# Patient Record
Sex: Female | Born: 1970 | Race: Black or African American | Hispanic: No | Marital: Single | Smoking: Former smoker
Health system: Southern US, Community
[De-identification: ages and names within clinical notes are randomized; demographics above are authoritative.]

## PROBLEM LIST (undated history)

## (undated) DIAGNOSIS — I10 Essential (primary) hypertension: Secondary | ICD-10-CM

## (undated) DIAGNOSIS — I8290 Acute embolism and thrombosis of unspecified vein: Secondary | ICD-10-CM

## (undated) DIAGNOSIS — M5430 Sciatica, unspecified side: Secondary | ICD-10-CM

## (undated) DIAGNOSIS — O2230 Deep phlebothrombosis in pregnancy, unspecified trimester: Secondary | ICD-10-CM

## (undated) DIAGNOSIS — C801 Malignant (primary) neoplasm, unspecified: Secondary | ICD-10-CM

## (undated) DIAGNOSIS — C539 Malignant neoplasm of cervix uteri, unspecified: Secondary | ICD-10-CM

## (undated) HISTORY — DX: Acute embolism and thrombosis of unspecified vein: I82.90

## (undated) HISTORY — PX: TUBAL LIGATION: SHX77

## (undated) HISTORY — DX: Malignant (primary) neoplasm, unspecified: C80.1

---

## 1998-03-27 ENCOUNTER — Emergency Department (HOSPITAL_COMMUNITY): Admission: EM | Admit: 1998-03-27 | Discharge: 1998-03-27 | Payer: Self-pay | Admitting: Emergency Medicine

## 1998-09-07 ENCOUNTER — Emergency Department (HOSPITAL_COMMUNITY): Admission: EM | Admit: 1998-09-07 | Discharge: 1998-09-07 | Payer: Self-pay | Admitting: Internal Medicine

## 1999-05-26 ENCOUNTER — Encounter: Payer: Self-pay | Admitting: *Deleted

## 1999-05-26 ENCOUNTER — Ambulatory Visit (HOSPITAL_COMMUNITY): Admission: RE | Admit: 1999-05-26 | Discharge: 1999-05-26 | Payer: Self-pay | Admitting: *Deleted

## 1999-06-08 ENCOUNTER — Inpatient Hospital Stay (HOSPITAL_COMMUNITY): Admission: AD | Admit: 1999-06-08 | Discharge: 1999-06-08 | Payer: Self-pay | Admitting: *Deleted

## 1999-10-09 ENCOUNTER — Encounter (INDEPENDENT_AMBULATORY_CARE_PROVIDER_SITE_OTHER): Payer: Self-pay

## 1999-10-09 ENCOUNTER — Inpatient Hospital Stay (HOSPITAL_COMMUNITY): Admission: AD | Admit: 1999-10-09 | Discharge: 1999-10-11 | Payer: Self-pay | Admitting: *Deleted

## 1999-11-02 ENCOUNTER — Emergency Department (HOSPITAL_COMMUNITY): Admission: EM | Admit: 1999-11-02 | Discharge: 1999-11-02 | Payer: Self-pay | Admitting: Emergency Medicine

## 1999-11-26 ENCOUNTER — Emergency Department (HOSPITAL_COMMUNITY): Admission: EM | Admit: 1999-11-26 | Discharge: 1999-11-26 | Payer: Self-pay

## 2000-03-06 ENCOUNTER — Emergency Department (HOSPITAL_COMMUNITY): Admission: EM | Admit: 2000-03-06 | Discharge: 2000-03-06 | Payer: Self-pay | Admitting: Emergency Medicine

## 2000-05-03 ENCOUNTER — Emergency Department (HOSPITAL_COMMUNITY): Admission: EM | Admit: 2000-05-03 | Discharge: 2000-05-03 | Payer: Self-pay | Admitting: Emergency Medicine

## 2000-05-04 ENCOUNTER — Emergency Department (HOSPITAL_COMMUNITY): Admission: EM | Admit: 2000-05-04 | Discharge: 2000-05-04 | Payer: Self-pay | Admitting: Emergency Medicine

## 2000-05-05 ENCOUNTER — Emergency Department (HOSPITAL_COMMUNITY): Admission: EM | Admit: 2000-05-05 | Discharge: 2000-05-06 | Payer: Self-pay

## 2001-05-18 ENCOUNTER — Emergency Department (HOSPITAL_COMMUNITY): Admission: EM | Admit: 2001-05-18 | Discharge: 2001-05-18 | Payer: Self-pay | Admitting: Emergency Medicine

## 2001-06-19 ENCOUNTER — Emergency Department (HOSPITAL_COMMUNITY): Admission: EM | Admit: 2001-06-19 | Discharge: 2001-06-19 | Payer: Self-pay | Admitting: Emergency Medicine

## 2001-10-09 ENCOUNTER — Emergency Department (HOSPITAL_COMMUNITY): Admission: EM | Admit: 2001-10-09 | Discharge: 2001-10-09 | Payer: Self-pay | Admitting: Emergency Medicine

## 2001-10-09 ENCOUNTER — Encounter: Payer: Self-pay | Admitting: Emergency Medicine

## 2003-09-06 ENCOUNTER — Emergency Department (HOSPITAL_COMMUNITY): Admission: EM | Admit: 2003-09-06 | Discharge: 2003-09-06 | Payer: Self-pay | Admitting: Emergency Medicine

## 2004-03-15 ENCOUNTER — Emergency Department (HOSPITAL_COMMUNITY): Admission: EM | Admit: 2004-03-15 | Discharge: 2004-03-15 | Payer: Self-pay | Admitting: Emergency Medicine

## 2004-08-25 ENCOUNTER — Emergency Department (HOSPITAL_COMMUNITY): Admission: EM | Admit: 2004-08-25 | Discharge: 2004-08-25 | Payer: Self-pay | Admitting: Emergency Medicine

## 2004-11-09 ENCOUNTER — Emergency Department (HOSPITAL_COMMUNITY): Admission: EM | Admit: 2004-11-09 | Discharge: 2004-11-09 | Payer: Self-pay | Admitting: Emergency Medicine

## 2004-11-15 ENCOUNTER — Emergency Department (HOSPITAL_COMMUNITY): Admission: EM | Admit: 2004-11-15 | Discharge: 2004-11-16 | Payer: Self-pay | Admitting: Emergency Medicine

## 2004-11-22 ENCOUNTER — Ambulatory Visit (HOSPITAL_COMMUNITY): Admission: RE | Admit: 2004-11-22 | Discharge: 2004-11-22 | Payer: Self-pay | Admitting: Orthopedic Surgery

## 2005-04-12 ENCOUNTER — Emergency Department (HOSPITAL_COMMUNITY): Admission: EM | Admit: 2005-04-12 | Discharge: 2005-04-12 | Payer: Self-pay | Admitting: Emergency Medicine

## 2006-01-05 ENCOUNTER — Emergency Department (HOSPITAL_COMMUNITY): Admission: EM | Admit: 2006-01-05 | Discharge: 2006-01-05 | Payer: Self-pay | Admitting: Family Medicine

## 2006-02-19 ENCOUNTER — Emergency Department (HOSPITAL_COMMUNITY): Admission: EM | Admit: 2006-02-19 | Discharge: 2006-02-19 | Payer: Self-pay | Admitting: Family Medicine

## 2006-05-06 ENCOUNTER — Emergency Department (HOSPITAL_COMMUNITY): Admission: EM | Admit: 2006-05-06 | Discharge: 2006-05-06 | Payer: Self-pay | Admitting: Family Medicine

## 2007-12-22 ENCOUNTER — Emergency Department (HOSPITAL_COMMUNITY): Admission: EM | Admit: 2007-12-22 | Discharge: 2007-12-22 | Payer: Self-pay | Admitting: Emergency Medicine

## 2008-01-11 ENCOUNTER — Emergency Department (HOSPITAL_COMMUNITY): Admission: EM | Admit: 2008-01-11 | Discharge: 2008-01-11 | Payer: Self-pay | Admitting: Family Medicine

## 2008-03-02 ENCOUNTER — Emergency Department (HOSPITAL_COMMUNITY): Admission: EM | Admit: 2008-03-02 | Discharge: 2008-03-02 | Payer: Self-pay | Admitting: Emergency Medicine

## 2008-08-22 ENCOUNTER — Inpatient Hospital Stay (HOSPITAL_COMMUNITY): Admission: AD | Admit: 2008-08-22 | Discharge: 2008-08-22 | Payer: Self-pay | Admitting: Family Medicine

## 2009-04-09 ENCOUNTER — Emergency Department (HOSPITAL_COMMUNITY): Admission: EM | Admit: 2009-04-09 | Discharge: 2009-04-09 | Payer: Self-pay | Admitting: Family Medicine

## 2009-08-17 ENCOUNTER — Emergency Department (HOSPITAL_COMMUNITY): Admission: EM | Admit: 2009-08-17 | Discharge: 2009-08-17 | Payer: Self-pay | Admitting: Emergency Medicine

## 2010-03-18 ENCOUNTER — Ambulatory Visit: Payer: Self-pay | Admitting: Obstetrics and Gynecology

## 2010-03-18 ENCOUNTER — Inpatient Hospital Stay (HOSPITAL_COMMUNITY): Admission: AD | Admit: 2010-03-18 | Discharge: 2010-03-18 | Payer: Self-pay | Admitting: Obstetrics & Gynecology

## 2010-03-26 ENCOUNTER — Encounter: Admission: RE | Admit: 2010-03-26 | Discharge: 2010-03-26 | Payer: Self-pay | Admitting: Obstetrics & Gynecology

## 2010-08-07 ENCOUNTER — Emergency Department (HOSPITAL_COMMUNITY)
Admission: EM | Admit: 2010-08-07 | Discharge: 2010-08-08 | Payer: Self-pay | Source: Home / Self Care | Admitting: Emergency Medicine

## 2010-10-07 ENCOUNTER — Emergency Department (HOSPITAL_COMMUNITY)
Admission: EM | Admit: 2010-10-07 | Discharge: 2010-10-07 | Payer: Self-pay | Source: Home / Self Care | Admitting: Family Medicine

## 2010-10-07 LAB — POCT RAPID STREP A (OFFICE): Streptococcus, Group A Screen (Direct): NEGATIVE

## 2010-12-21 LAB — POCT PREGNANCY, URINE: Preg Test, Ur: NEGATIVE

## 2011-01-06 LAB — POCT RAPID STREP A (OFFICE): Streptococcus, Group A Screen (Direct): POSITIVE — AB

## 2011-01-23 ENCOUNTER — Inpatient Hospital Stay (HOSPITAL_COMMUNITY)
Admission: RE | Admit: 2011-01-23 | Discharge: 2011-01-23 | Disposition: A | Discharge status: Home / Self Care | Payer: Self-pay | Source: Ambulatory Visit | Attending: Family Medicine | Admitting: Family Medicine

## 2011-06-30 LAB — ETHANOL: Alcohol, Ethyl (B): <5

## 2011-06-30 LAB — COMPREHENSIVE METABOLIC PANEL
ALT: 12
AST: 12
Albumin: 3.5
Alkaline Phosphatase: 66
BUN: 6
CO2: 25
Calcium: 8.8
Chloride: 107
Creatinine, Ser: 0.77
GFR calc Af Amer: >60
GFR calc non Af Amer: >60
Glucose, Bld: 97
Potassium: 4.2
Sodium: 138
Total Bilirubin: 1
Total Protein: 6.8

## 2011-06-30 LAB — DIFFERENTIAL
Basophils Absolute: 0
Basophils Relative: 0
Eosinophils Absolute: 0.1
Eosinophils Relative: 1
Lymphocytes Relative: 34
Lymphs Abs: 2.7
Monocytes Absolute: 0.6
Monocytes Relative: 8
Neutro Abs: 4.4
Neutrophils Relative %: 56

## 2011-06-30 LAB — CBC
HCT: 35.6 — ABNORMAL LOW
Hemoglobin: 11.6 — ABNORMAL LOW
MCHC: 32.5
MCV: 79.4
Platelets: 358
RBC: 4.49
RDW: 15.9 — ABNORMAL HIGH
WBC: 7.8

## 2011-06-30 LAB — RAPID URINE DRUG SCREEN, HOSP PERFORMED
Amphetamines: NOT DETECTED
Barbiturates: NOT DETECTED
Benzodiazepines: NOT DETECTED
Cocaine: NOT DETECTED
Opiates: POSITIVE — AB
Tetrahydrocannabinol: POSITIVE — AB

## 2011-06-30 LAB — ACETAMINOPHEN LEVEL: Acetaminophen (Tylenol), Serum: 11.7

## 2011-06-30 LAB — SALICYLATE LEVEL: Salicylate Lvl: <4

## 2011-07-07 LAB — POCT PREGNANCY, URINE: Preg Test, Ur: NEGATIVE

## 2011-07-07 LAB — WET PREP, GENITAL
Clue Cells Wet Prep HPF POC: NONE SEEN
Yeast Wet Prep HPF POC: NONE SEEN

## 2011-07-07 LAB — GC/CHLAMYDIA PROBE AMP, GENITAL
Chlamydia, DNA Probe: NEGATIVE
GC Probe Amp, Genital: NEGATIVE

## 2011-10-11 ENCOUNTER — Encounter: Payer: Self-pay | Admitting: Emergency Medicine

## 2011-10-11 ENCOUNTER — Emergency Department (HOSPITAL_COMMUNITY)
Admission: EM | Admit: 2011-10-11 | Discharge: 2011-10-11 | Disposition: A | Discharge status: Home / Self Care | Payer: Self-pay | Attending: Emergency Medicine | Admitting: Emergency Medicine

## 2011-10-11 DIAGNOSIS — R062 Wheezing: Secondary | ICD-10-CM | POA: Insufficient documentation

## 2011-10-11 DIAGNOSIS — R05 Cough: Secondary | ICD-10-CM | POA: Insufficient documentation

## 2011-10-11 DIAGNOSIS — J069 Acute upper respiratory infection, unspecified: Secondary | ICD-10-CM | POA: Insufficient documentation

## 2011-10-11 DIAGNOSIS — R070 Pain in throat: Secondary | ICD-10-CM | POA: Insufficient documentation

## 2011-10-11 DIAGNOSIS — H9209 Otalgia, unspecified ear: Secondary | ICD-10-CM | POA: Insufficient documentation

## 2011-10-11 DIAGNOSIS — R059 Cough, unspecified: Secondary | ICD-10-CM | POA: Insufficient documentation

## 2011-10-11 DIAGNOSIS — J029 Acute pharyngitis, unspecified: Secondary | ICD-10-CM

## 2011-10-11 DIAGNOSIS — F17200 Nicotine dependence, unspecified, uncomplicated: Secondary | ICD-10-CM | POA: Insufficient documentation

## 2011-10-11 LAB — RAPID STREP SCREEN (MED CTR MEBANE ONLY): Streptococcus, Group A Screen (Direct): NEGATIVE

## 2011-10-11 MED ORDER — SULFAMETHOXAZOLE-TRIMETHOPRIM 400-80 MG PO TABS: 1.0000 | ORAL_TABLET | Freq: Two times a day (BID) | ORAL | Status: AC

## 2011-10-11 MED ORDER — BENZONATATE 100 MG PO CAPS: 100.0000 mg | ORAL_CAPSULE | Freq: Three times a day (TID) | ORAL | Status: AC

## 2011-10-11 NOTE — ED Provider Notes (Signed)
History     CSN: [id number]  Arrival date & time 10/11/11  1045   First MD Initiated Contact with Patient 10/11/11 1133      Chief Complaint  Patient presents with  . Sore Throat    (Consider location/radiation/quality/duration/timing/severity/associated sxs/prior treatment) HPI  Sore Throat: Patient complains of sore throat. Associated symptoms include bilateral ear pain, productive cough and sore throat.Onset of symptoms was 5 days ago, stable since that time. She is very dehydrated. She has had recent close exposure to someone with proven streptococcal pharyngitis.   History reviewed. No pertinent past medical history.  History reviewed. No pertinent past surgical history.  History reviewed. No pertinent family history.  History  Substance Use Topics  . Smoking status: Current Everyday Smoker  . Smokeless tobacco: Not on file  . Alcohol Use: No    OB History    Grav Para Term Preterm Abortions TAB SAB Ect Mult Living                  Review of Systems  All other systems reviewed and are negative.    Allergies  Review of patient's allergies indicates no known allergies.  Home Medications   Current Outpatient Rx  Name Route Sig Dispense Refill  . BENZONATATE 100 MG PO CAPS Oral Take 1 capsule (100 mg total) by mouth every 8 (eight) hours. 21 capsule 0  . SULFAMETHOXAZOLE-TRIMETHOPRIM 400-80 MG PO TABS Oral Take 1 tablet by mouth 2 (two) times daily. 10 tablet 0    BP 135/91  Pulse 72  Temp(Src) 98.4 F (36.9 C) (Oral)  Resp 18  SpO2 99%  Physical Exam  Constitutional: She appears well-developed and well-nourished. No distress.  HENT:  Head: Normocephalic and atraumatic.  Right Ear: Tympanic membrane normal.  Left Ear: Tympanic membrane normal.  Nose: Nose normal.  Mouth/Throat: Uvula is midline and mucous membranes are normal. Posterior oropharyngeal erythema present. No oropharyngeal exudate, posterior oropharyngeal edema or tonsillar abscesses.    Eyes: Conjunctivae are normal. Pupils are equal, round, and reactive to light.  Neck: Trachea normal, normal range of motion and full passive range of motion without pain. Neck supple.  Cardiovascular: Normal rate, regular rhythm and normal pulses.   Pulmonary/Chest: Effort normal. No respiratory distress. She has wheezes. She has no rales. Chest wall is not dull to percussion. She exhibits no tenderness, no crepitus, no edema, no deformity and no retraction.       Pt coughing during exam  Abdominal: Soft. Normal appearance and bowel sounds are normal.  Musculoskeletal: Normal range of motion.  Lymphadenopathy:       Head (right side): No submental, no submandibular, no tonsillar, no preauricular, no posterior auricular and no occipital adenopathy present.       Head (left side): No submental, no submandibular, no tonsillar, no preauricular, no posterior auricular and no occipital adenopathy present.    She has no cervical adenopathy.    She has no axillary adenopathy.  Neurological: She is alert. She has normal strength.  Skin: Skin is warm, dry and intact. She is not diaphoretic.  Psychiatric: She has a normal mood and affect. Her speech is normal and behavior is normal. Judgment and thought content normal. Cognition and memory are normal.    ED Course  Procedures (including critical care time)   Labs Reviewed  RAPID STREP SCREEN   No results found.   1. Sore throat   2. URI (upper respiratory infection)       MDM  Pt understand symptoms that warrant a return visit.       Dorthula Matas, PA 10/11/11 1243

## 2011-10-11 NOTE — ED Notes (Signed)
Pt d/c home. NAD noted at d/c

## 2011-10-11 NOTE — ED Notes (Signed)
Pt here with family member being seen; pt c/o sore throat with intermittent fever x several days

## 2011-10-11 NOTE — ED Provider Notes (Signed)
Medical screening examination/treatment/procedure(s) were performed by non-physician practitioner and as supervising physician I was immediately available for consultation/collaboration.  Gerhard Munch, MD 10/11/11 [phone]

## 2012-01-14 ENCOUNTER — Encounter (HOSPITAL_COMMUNITY): Payer: Self-pay

## 2012-01-14 ENCOUNTER — Emergency Department (INDEPENDENT_AMBULATORY_CARE_PROVIDER_SITE_OTHER)
Admission: EM | Admit: 2012-01-14 | Discharge: 2012-01-14 | Disposition: A | Discharge status: Home / Self Care | Payer: Self-pay | Source: Home / Self Care | Attending: Family Medicine | Admitting: Family Medicine

## 2012-01-14 DIAGNOSIS — L0201 Cutaneous abscess of face: Secondary | ICD-10-CM

## 2012-01-14 DIAGNOSIS — L03211 Cellulitis of face: Secondary | ICD-10-CM

## 2012-01-14 MED ORDER — SULFAMETHOXAZOLE-TRIMETHOPRIM 800-160 MG PO TABS: 1.0000 | ORAL_TABLET | Freq: Two times a day (BID) | ORAL | Status: AC

## 2012-01-14 NOTE — ED Notes (Signed)
C/o onset facial pain and swelling 3 days ago w periods of intense itching; NAD  at present

## 2012-01-14 NOTE — Discharge Instructions (Signed)
Take antibiotics as directed. You may apply an over-the-counter hydrocortisone cream for symptom relief; you may also try an over-the-counter antihistamine such as Claritin for symptoms. Please return on Monday morning for reevaluation with me, Dr. Juanetta Gosling. Return to care sooner should symptoms worsen such as any involvement near your eye or visual changes.

## 2012-01-14 NOTE — ED Provider Notes (Signed)
History     CSN: [id number]  Arrival date & time 01/14/12  1054   First MD Initiated Contact with Patient 01/14/12 1144      Chief Complaint  Patient presents with  . Facial Pain    (Consider location/radiation/quality/duration/timing/severity/associated sxs/prior treatment) HPI Comments: Emma Medina presents for evaluation of LEFT sided facial swelling over the last three days. She denies any pimple or sore. She does have a nose piercing, but does not currently have a ring and it right now. She reports that she has not worn one "in a while". However, under further interview, she reports that she did put one back in last week. She denies any nasal drainage, rhinorrhea, visual disturbance, or fever. She does report itching and pain intermittently. She denies any other significant past medical history.  The history is provided by the patient.    History reviewed. No pertinent past medical history.  History reviewed. No pertinent past surgical history.  History reviewed. No pertinent family history.  History  Substance Use Topics  . Smoking status: Current Everyday Smoker  . Smokeless tobacco: Not on file  . Alcohol Use: No    OB History    Grav Para Term Preterm Abortions TAB SAB Ect Mult Living                  Review of Systems  Constitutional: Negative.   HENT: Negative.   Eyes: Negative.   Respiratory: Negative.   Cardiovascular: Negative.   Gastrointestinal: Negative.   Genitourinary: Negative.   Musculoskeletal: Negative.   Skin: Positive for wound.  Neurological: Negative.     Allergies  Review of patient's allergies indicates no known allergies.  Home Medications   Current Outpatient Rx  Name Route Sig Dispense Refill  . SULFAMETHOXAZOLE-TRIMETHOPRIM 800-160 MG PO TABS Oral Take 1 tablet by mouth 2 (two) times daily. 14 tablet 0    BP 114/86  Pulse 71  Temp(Src) 98.9 F (37.2 C) (Oral)  Resp 16  SpO2 100%  LMP 01/12/2012  Physical Exam  Nursing  note and vitals reviewed. Constitutional: She is oriented to person, place, and time. She appears well-developed and well-nourished.  HENT:  Head: Normocephalic and atraumatic. Head is without laceration.    Right Ear: Tympanic membrane normal.  Left Ear: Tympanic membrane normal.  Mouth/Throat: Uvula is midline, oropharynx is clear and moist and mucous membranes are normal.  Eyes: EOM are normal.  Neck: Normal range of motion.  Pulmonary/Chest: Effort normal.  Musculoskeletal: Normal range of motion.  Neurological: She is alert and oriented to person, place, and time.  Skin: Skin is warm and dry. There is erythema.  Psychiatric: Her behavior is normal.    ED Course  Procedures (including critical care time)  Labs Reviewed - No data to display No results found.   1. Facial cellulitis       MDM  Start TMP-SMX and return in 3 days for re-evaluation        Emma Munda, MD 01/14/12 1314

## 2014-06-09 ENCOUNTER — Emergency Department (HOSPITAL_COMMUNITY)
Admission: EM | Admit: 2014-06-09 | Discharge: 2014-06-09 | Disposition: A | Discharge status: Home / Self Care | Payer: Self-pay | Attending: Emergency Medicine | Admitting: Emergency Medicine

## 2014-06-09 ENCOUNTER — Encounter (HOSPITAL_COMMUNITY): Payer: Self-pay | Admitting: Emergency Medicine

## 2014-06-09 DIAGNOSIS — J069 Acute upper respiratory infection, unspecified: Secondary | ICD-10-CM | POA: Insufficient documentation

## 2014-06-09 DIAGNOSIS — H5789 Other specified disorders of eye and adnexa: Secondary | ICD-10-CM | POA: Insufficient documentation

## 2014-06-09 DIAGNOSIS — J029 Acute pharyngitis, unspecified: Secondary | ICD-10-CM | POA: Insufficient documentation

## 2014-06-09 DIAGNOSIS — F17200 Nicotine dependence, unspecified, uncomplicated: Secondary | ICD-10-CM | POA: Insufficient documentation

## 2014-06-09 MED ORDER — HYDROCOD POLST-CHLORPHEN POLST 10-8 MG/5ML PO LQCR: 5.0000 mL | Freq: Two times a day (BID) | ORAL | Status: DC | PRN

## 2014-06-09 MED ORDER — HYDROCOD POLST-CHLORPHEN POLST 10-8 MG/5ML PO LQCR
5.0000 mL | Freq: Once | ORAL | Status: AC
  Administered 2014-06-09: 5 mL via ORAL
  Filled 2014-06-09: qty 5

## 2014-06-09 NOTE — ED Notes (Signed)
Reports having a sore throat for several days. Denies fever. Airway intact.

## 2014-06-09 NOTE — ED Provider Notes (Signed)
CSN: [id number]     Arrival date & time 06/09/14  1512 History  This chart was scribed for non-physician practitioner, Charlann Lange, PA-C working with Fredia Sorrow, MD, by Erling Conte, ED Scribe. This patient was seen in room TR05C/TR05C and the patient's care was started at 3:36 PM.    Chief Complaint  Patient presents with  . Sore Throat     The history is provided by the patient. No language interpreter was used.    HPI Comments: Emma Medina is a 43 y.o. female who presents to the Emergency Department complaining of a constant, moderate, "10/10", "burning", sore throat for 2 days. Pt states she has associated HA and cough. She denies any sick contacts at home. She states she mostly feels fine with the exception of the sore throat. She is a current every day smoker. She has not smoked any today. She denies any fever, chills, congestion, shortness of breath, nausea, vomiting, or otalgia.   History reviewed. No pertinent past medical history. History reviewed. No pertinent past surgical history. History reviewed. No pertinent family history. History  Substance Use Topics  . Smoking status: Current Every Day Smoker  . Smokeless tobacco: Not on file  . Alcohol Use: No   OB History   Grav Para Term Preterm Abortions TAB SAB Ect Mult Living                 Review of Systems  Constitutional: Negative for fever and chills.  HENT: Positive for sore throat. Negative for ear pain.   Eyes: Positive for discharge.  Respiratory: Positive for cough. Negative for shortness of breath.   Gastrointestinal: Negative for nausea, vomiting and abdominal pain.  Neurological: Positive for headaches.  All other systems reviewed and are negative.     Allergies  Review of patient's allergies indicates no known allergies.  Home Medications   Prior to Admission medications   Not on File   Triage Vitals: BP 121/78  Pulse 69  Temp(Src) 98.5 F (36.9 C) (Oral)  Resp 18  SpO2 96%   LMP 05/19/2014  Physical Exam  Nursing note and vitals reviewed. Constitutional: She is oriented to person, place, and time. She appears well-developed and well-nourished. No distress.  HENT:  Head: Normocephalic and atraumatic.  Right Ear: Tympanic membrane normal.  Left Ear: Tympanic membrane normal.  Nose: Mucosal edema (minimal) present.  Mouth/Throat: Oropharynx is clear and moist.  Eyes: Conjunctivae and EOM are normal.  Neck: Neck supple. No tracheal deviation present.  Cardiovascular: Normal rate, regular rhythm and normal heart sounds.   Pulmonary/Chest: Effort normal and breath sounds normal. No respiratory distress. She has no wheezes. She has no rhonchi. She has no rales.  Musculoskeletal: Normal range of motion.  Lymphadenopathy:    She has no cervical adenopathy.  Neurological: She is alert and oriented to person, place, and time.  Skin: Skin is warm and dry.  Psychiatric: She has a normal mood and affect. Her behavior is normal.    ED Course  Procedures (including critical care time)  DIAGNOSTIC STUDIES: Oxygen Saturation is 96% on RA, adequate by my interpretation.    COORDINATION OF CARE:  Labs Review Labs Reviewed - No data to display  Imaging Review No results found.   EKG Interpretation None      MDM   Final diagnoses:  None    1. URI 2. Pharyngitis  Suspect viral illness - afebrile, no concerning exam findings for infection. Supportive care and PCP follow up encouraged.  I personally performed the services described in this documentation, which was scribed in my presence. The recorded information has been reviewed and is accurate.     Dewaine Oats, PA-C 06/09/14 1553

## 2014-06-09 NOTE — Discharge Instructions (Signed)
Salt Water Gargle This solution will help make your mouth and throat feel better. HOME CARE INSTRUCTIONS   Mix 1 teaspoon of salt in 8 ounces of warm water.  Gargle with this solution as much or often as you need or as directed. Swish and gargle gently if you have any sores or wounds in your mouth.  Do not swallow this mixture. Document Released: 06/24/2004 Document Revised: 12/13/2011 Document Reviewed: 11/15/2008 [facility] Patient Information 2015 [place], [place]. This information is not intended to replace advice given to you by your health care provider. Make sure you discuss any questions you have with your health care provider. Sore Throat A sore throat is pain, burning, irritation, or scratchiness of the throat. There is often pain or tenderness when swallowing or talking. A sore throat may be accompanied by other symptoms, such as coughing, sneezing, fever, and swollen neck glands. A sore throat is often the first sign of another sickness, such as a cold, flu, strep throat, or mononucleosis (commonly known as mono). Most sore throats go away without medical treatment. CAUSES  The most common causes of a sore throat include:  A viral infection, such as a cold, flu, or mono.  A bacterial infection, such as strep throat, tonsillitis, or whooping cough.  Seasonal allergies.  Dryness in the air.  Irritants, such as smoke or pollution.  Gastroesophageal reflux disease (GERD). HOME CARE INSTRUCTIONS   Only take over-the-counter medicines as directed by your caregiver.  Drink enough fluids to keep your urine clear or pale yellow.  Rest as needed.  Try using throat sprays, lozenges, or sucking on hard candy to ease any pain (if older than 4 years or as directed).  Sip warm liquids, such as broth, herbal tea, or warm water with honey to relieve pain temporarily. You may also eat or drink cold or frozen liquids such as frozen ice pops.  Gargle with salt water (mix 1 tsp salt with 8  oz of water).  Do not smoke and avoid secondhand smoke.  Put a cool-mist humidifier in your bedroom at night to moisten the air. You can also turn on a hot shower and sit in the bathroom with the door closed for 5-10 minutes. SEEK IMMEDIATE MEDICAL CARE IF:  You have difficulty breathing.  You are unable to swallow fluids, soft foods, or your saliva.  You have increased swelling in the throat.  Your sore throat does not get better in 7 days.  You have nausea and vomiting.  You have a fever or persistent symptoms for more than 2-3 days.  You have a fever and your symptoms suddenly get worse. MAKE SURE YOU:   Understand these instructions.  Will watch your condition.  Will get help right away if you are not doing well or get worse. Document Released: 10/28/2004 Document Revised: 09/06/2012 Document Reviewed: 05/28/2012 [facility] Patient Information 2015 [place], [place]. This information is not intended to replace advice given to you by your health care provider. Make sure you discuss any questions you have with your health care provider. Upper Respiratory Infection, Adult An upper respiratory infection (URI) is also known as the common cold. It is often caused by a type of germ (virus). Colds are easily spread (contagious). You can pass it to others by kissing, coughing, sneezing, or drinking out of the same glass. Usually, you get better in 1 or 2 weeks.  HOME CARE   Only take medicine as told by your doctor.  Use a warm mist humidifier or  breathe in steam from a hot shower.  Drink enough water and fluids to keep your pee (urine) clear or pale yellow.  Get plenty of rest.  Return to work when your temperature is back to normal or as told by your doctor. You may use a face mask and wash your hands to stop your cold from spreading. GET HELP RIGHT AWAY IF:   After the first few days, you feel you are getting worse.  You have questions about your medicine.  You have chills,  shortness of breath, or brown or red spit (mucus).  You have yellow or brown snot (nasal discharge) or pain in the face, especially when you bend forward.  You have a fever, puffy (swollen) neck, pain when you swallow, or white spots in the back of your throat.  You have a bad headache, ear pain, sinus pain, or chest pain.  You have a high-pitched whistling sound when you breathe in and out (wheezing).  You have a lasting cough or cough up blood.  You have sore muscles or a stiff neck. MAKE SURE YOU:   Understand these instructions.  Will watch your condition.  Will get help right away if you are not doing well or get worse. Document Released: 03/08/2008 Document Revised: 12/13/2011 Document Reviewed: 12/26/2013 [facility] Patient Information 2015 [place], [place]. This information is not intended to replace advice given to you by your health care provider. Make sure you discuss any questions you have with your health care provider.

## 2014-06-13 NOTE — ED Provider Notes (Signed)
Medical screening examination/treatment/procedure(s) were performed by non-physician practitioner and as supervising physician I was immediately available for consultation/collaboration.   EKG Interpretation None        Fredia Sorrow, MD 06/13/14 [phone]

## 2014-09-09 ENCOUNTER — Emergency Department (HOSPITAL_COMMUNITY)
Admission: EM | Admit: 2014-09-09 | Discharge: 2014-09-09 | Disposition: A | Discharge status: Home / Self Care | Payer: Self-pay | Attending: Emergency Medicine | Admitting: Emergency Medicine

## 2014-09-09 ENCOUNTER — Encounter (HOSPITAL_COMMUNITY): Payer: Self-pay | Admitting: Family Medicine

## 2014-09-09 DIAGNOSIS — M5432 Sciatica, left side: Secondary | ICD-10-CM

## 2014-09-09 DIAGNOSIS — M79605 Pain in left leg: Secondary | ICD-10-CM

## 2014-09-09 DIAGNOSIS — Z720 Tobacco use: Secondary | ICD-10-CM | POA: Insufficient documentation

## 2014-09-09 DIAGNOSIS — R2242 Localized swelling, mass and lump, left lower limb: Secondary | ICD-10-CM | POA: Insufficient documentation

## 2014-09-09 DIAGNOSIS — M5442 Lumbago with sciatica, left side: Secondary | ICD-10-CM

## 2014-09-09 DIAGNOSIS — M25472 Effusion, left ankle: Secondary | ICD-10-CM

## 2014-09-09 DIAGNOSIS — M79609 Pain in unspecified limb: Secondary | ICD-10-CM

## 2014-09-09 MED ORDER — IBUPROFEN 800 MG PO TABS: 800.0000 mg | ORAL_TABLET | Freq: Three times a day (TID) | ORAL | Status: DC

## 2014-09-09 MED ORDER — OXYCODONE-ACETAMINOPHEN 5-325 MG PO TABS: 1.0000 | ORAL_TABLET | ORAL | Status: DC | PRN

## 2014-09-09 MED ORDER — HYDROCODONE-ACETAMINOPHEN 5-325 MG PO TABS
2.0000 | ORAL_TABLET | Freq: Once | ORAL | Status: AC
  Administered 2014-09-09: 2 via ORAL
  Filled 2014-09-09: qty 2

## 2014-09-09 MED ORDER — CYCLOBENZAPRINE HCL 10 MG PO TABS: 10.0000 mg | ORAL_TABLET | Freq: Two times a day (BID) | ORAL | Status: DC | PRN

## 2014-09-09 MED ORDER — PREDNISONE 10 MG PO TABS
40.0000 mg | ORAL_TABLET | Freq: Every day | ORAL | Status: DC
Start: 2014-09-09 — End: 2015-06-05

## 2014-09-09 NOTE — Progress Notes (Signed)
*  PRELIMINARY RESULTS* Vascular Ultrasound Left lower extremity venous duplex has been completed.  Preliminary findings: no evidence of DVT or baker's cyst.  Landry Mellow, RDMS, RVT  09/09/2014, 5:59 PM

## 2014-09-09 NOTE — ED Notes (Signed)
Per pt sts LLE pain and swelling x 1 week and worse last night.

## 2014-09-09 NOTE — Progress Notes (Deleted)
*  PRELIMINARY RESULTS* Vascular Ultrasound Left lower extremity venous duplex has been completed.  Preliminary findings: no evidence of DVT or baker's cyst.  Landry Mellow, RDMS, RVT  09/09/2014, 5:58 PM

## 2014-09-09 NOTE — ED Provider Notes (Signed)
CSN: [id number]     Arrival date & time 09/09/14  1646 History   First MD Initiated Contact with Patient 09/09/14 1856     Chief Complaint  Patient presents with  . Leg Pain     (Consider location/radiation/quality/duration/timing/severity/associated sxs/prior Treatment) The history is provided by the patient and medical records. No language interpreter was used.     Emma Medina is a 43 y.o. female  with a hx of intermittent low back pain presents to the Emergency Department complaining of gradual, persistent, progressively worsening left leg pain onset last night.  Pt reports she has intermittent left low back and leg pain that normally abates with ibuprofen, but did not abate with tylenol this AM.  Pt denies falls, known injury, hx of Cancer, diabetes, IVDU or anticopagulation.  Pt denies recent travel, estrogen usage, hx of DVT, recent surgeries or immobilization.  She reports associated left leg swelling which she noticed last night as well. Pt reports associated left lower back pain with intermittent paresthesias of the left leg, not currently present. 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  Pt denies back surgery. Pt reports palpation and walking aggravate the pain and nothing seems to make it better. She denies fever, chills, headache, neck pain, chest pain, shortness of breath, abdominal pain, nausea, vomiting, diarrhea, weakness, dizziness, syncope.  Patient denies loss of bowel or bladder control, saddle anesthesia or difficulty with movement of her legs.   History reviewed. No pertinent past medical history. History reviewed. No pertinent past surgical history. History reviewed. No pertinent family history. History  Substance Use Topics  . Smoking status: Current Every Day Smoker  . Smokeless tobacco: Not on file  . Alcohol Use: No   OB History    No data available     Review of Systems  Constitutional: Negative for  fever, diaphoresis, appetite change, fatigue and unexpected weight change.  HENT: Negative for mouth sores.   Eyes: Negative for visual disturbance.  Respiratory: Negative for cough, chest tightness, shortness of breath and wheezing.   Cardiovascular: Negative for chest pain.  Gastrointestinal: Negative for nausea, vomiting, abdominal pain, diarrhea and constipation.  Endocrine: Negative for polydipsia, polyphagia and polyuria.  Genitourinary: Negative for dysuria, urgency, frequency and hematuria.  Musculoskeletal: Positive for back pain and gait problem ( 2/2 pain ). Negative for joint swelling, neck pain and neck stiffness.  Skin: Negative for rash.  Allergic/Immunologic: Negative for immunocompromised state.  Neurological: Negative for syncope, weakness, light-headedness, numbness and headaches.  Hematological: Does not bruise/bleed easily.  Psychiatric/Behavioral: Negative for sleep disturbance. The patient is not nervous/anxious.   All other systems reviewed and are negative.     Allergies  Review of patient's allergies indicates no known allergies.  Home Medications   Prior to Admission medications   Medication Sig Start Date End Date Taking? Authorizing Provider  acetaminophen (TYLENOL) 500 MG tablet Take 2,000 mg by mouth every 6 (six) hours as needed for mild pain or moderate pain.   Yes Historical Provider, MD  chlorpheniramine-HYDROcodone (TUSSIONEX PENNKINETIC ER) 10-8 MG/5ML LQCR Take 5 mLs by mouth every 12 (twelve) hours as needed for cough. Patient not taking: Reported on 09/09/2014 06/09/14   Nehemiah Settle A Upstill, PA-C  cyclobenzaprine (FLEXERIL) 10 MG tablet Take 1 tablet (10 mg total) by mouth 2 (two) times daily as needed for muscle spasms. 09/09/14   Rasheida Broden, PA-C  ibuprofen (ADVIL,MOTRIN) 800 MG tablet Take 1 tablet (800  mg total) by mouth 3 (three) times daily. 09/09/14   Meleana Commerford, PA-C  oxyCODONE-acetaminophen (PERCOCET) 5-325 MG per tablet Take 1  tablet by mouth every 4 (four) hours as needed for moderate pain. 09/09/14   Lamario Mani, PA-C  predniSONE (DELTASONE) 10 MG tablet Take 4 tablets (40 mg total) by mouth daily. 09/09/14   Zawadi Aplin, PA-C   BP 117/84 mmHg  Pulse 76  Temp(Src) 98.3 F (36.8 C) (Oral)  Resp 18  Ht 5\' 6"  (1.676 m)  SpO2 100%  LMP 09/06/2014 Physical Exam  Constitutional: She appears well-developed and well-nourished. No distress.  Awake, alert, nontoxic appearance  HENT:  Head: Normocephalic and atraumatic.  Mouth/Throat: Oropharynx is clear and moist. No oropharyngeal exudate.  Eyes: Conjunctivae are normal. No scleral icterus.  Neck: Normal range of motion. Neck supple.  Full ROM without pain  Cardiovascular: Normal rate, regular rhythm, normal heart sounds and intact distal pulses.   No murmur heard. Pulmonary/Chest: Effort normal and breath sounds normal. No respiratory distress. She has no wheezes.  Equal chest expansion  Abdominal: Soft. Bowel sounds are normal. She exhibits no distension and no mass. There is no tenderness. There is no rebound and no guarding.  Musculoskeletal: Normal range of motion. She exhibits no edema.  Full range of motion of the T-spine and L-spine No tenderness to palpation of the spinous processes of the T-spine or L-spine Mild tenderness to palpation of the left paraspinous muscles of the L-spine and over the left SI joint Positive left straight leg raise Visible swelling of the left ankle with full range of motion of the left toes, left ankle, left knee and left hip; no visible deformity or ecchymosis  Lymphadenopathy:    She has no cervical adenopathy.  Neurological: She is alert. She has normal reflexes.  Reflex Scores:      Bicep reflexes are 2+ on the right side and 2+ on the left side.      Brachioradialis reflexes are 2+ on the right side and 2+ on the left side.      Patellar reflexes are 2+ on the right side and 2+ on the left side.       Achilles reflexes are 2+ on the right side and 2+ on the left side. Speech is clear and goal oriented, follows commands Normal 5/5 strength in upper and lower extremities bilaterally including dorsiflexion and plantar flexion, strong and equal grip strength Sensation normal to light and sharp touch Moves extremities without ataxia, coordination intact Antalgic gait without foot drop or dragging of the left leg Normal balance No Clonus   Skin: Skin is warm and dry. No rash noted. She is not diaphoretic. No erythema.  Psychiatric: She has a normal mood and affect. Her behavior is normal.  Nursing note and vitals reviewed.   ED Course  Procedures (including critical care time) Labs Review Labs Reviewed - No data to display  Imaging Review No results found.   EKG Interpretation None      MDM   Final diagnoses:  Sciatica, left  Left-sided low back pain with left-sided sciatica  Left leg pain  Left ankle swelling   Emma Medina presents with left leg pain and in association with left back pain. Reproducible pain and radiation of pain with palpation of the left SI joint and left buttock consistent with sciatica however reflexive pain is also found with palpation of the left anterior thigh and left posterior calf. No palpable cord or Homans sign.  Pain to palpation with the left ankle with patient denies injury.  Patient without signs or symptoms of cauda equina. She ambulates here in the emergency department with an antalgic gait but she is able to weight-bear and has no foot drop.    Venous duplex is without evidence of DVT.  Normal neurological exam, no evidence of urinary incontinence or retention, pain is consistently reproducible. There is no evidence of AAA or concern for dissection at this time.   Patient can walk but states is painful.  No loss of bowel or bladder control.  No concern for cauda equina.  No fever, night sweats, weight loss, h/o cancer, IVDU.  Pain treated  here in the department with adequate improvement. RICE protocol and pain medicine indicated and discussed with patient in addition to muscle relaxers and prednisone.  I have personally reviewed patient's vitals, nursing note and any pertinent labs or imaging.  I performed an undressed physical exam.    It has been determined that no acute conditions requiring further emergency intervention are present at this time. The patient/guardian have been advised of the diagnosis and plan. I reviewed all labs and imaging including any potential incidental findings. We have discussed signs and symptoms that warrant return to the ED and they are listed in the discharge instructions.  She is to follow-up with orthopedics in 3 days.  Vital signs are stable at discharge.   BP 117/84 mmHg  Pulse 76  Temp(Src) 98.3 F (36.8 C) (Oral)  Resp 18  Ht 5\' 6"  (1.676 m)  SpO2 100%  LMP 09/06/2014  The patient was discussed with Dr. Jeneen Rinks who agrees with the treatment plan.     Jarrett Soho Sheyenne Konz, PA-C 09/09/14 2100  Tanna Furry, MD 09/17/14 2245

## 2014-09-09 NOTE — Discharge Instructions (Signed)
1. Medications: Flexeril, prednisone, ibuprofen, Percocet, usual home medications 2. Treatment: rest, drink plenty of fluids, gentle stretching as discussed, alternate ice and heat 3. Follow Up: Please followup with your primary doctor in 3 days for discussion of your diagnoses and further evaluation after today's visit; if you do not have a primary care doctor use the resource guide provided to find one;  Return to the ER for worsening back pain, difficulty walking, loss of bowel or bladder control or other concerning symptoms    Sciatica Sciatica is pain, weakness, numbness, or tingling along the path of the sciatic nerve. The nerve starts in the lower back and runs down the back of each leg. The nerve controls the muscles in the lower leg and in the back of the knee, while also providing sensation to the back of the thigh, lower leg, and the sole of your foot. Sciatica is a symptom of another medical condition. For instance, nerve damage or certain conditions, such as a herniated disk or bone spur on the spine, pinch or put pressure on the sciatic nerve. This causes the pain, weakness, or other sensations normally associated with sciatica. Generally, sciatica only affects one side of the body. CAUSES   Herniated or slipped disc.  Degenerative disk disease.  A pain disorder involving the narrow muscle in the buttocks (piriformis syndrome).  Pelvic injury or fracture.  Pregnancy.  Tumor (rare). SYMPTOMS  Symptoms can vary from mild to very severe. The symptoms usually travel from the low back to the buttocks and down the back of the leg. Symptoms can include:  Mild tingling or dull aches in the lower back, leg, or hip.  Numbness in the back of the calf or sole of the foot.  Burning sensations in the lower back, leg, or hip.  Sharp pains in the lower back, leg, or hip.  Leg weakness.  Severe back pain inhibiting movement. These symptoms may get worse with coughing, sneezing,  laughing, or prolonged sitting or standing. Also, being overweight may worsen symptoms. DIAGNOSIS  Your caregiver will perform a physical exam to look for common symptoms of sciatica. He or she may ask you to do certain movements or activities that would trigger sciatic nerve pain. Other tests may be performed to find the cause of the sciatica. These may include:  Blood tests.  X-rays.  Imaging tests, such as an MRI or CT scan. TREATMENT  Treatment is directed at the cause of the sciatic pain. Sometimes, treatment is not necessary and the pain and discomfort goes away on its own. If treatment is needed, your caregiver may suggest:  Over-the-counter medicines to relieve pain.  Prescription medicines, such as anti-inflammatory medicine, muscle relaxants, or narcotics.  Applying heat or ice to the painful area.  Steroid injections to lessen pain, irritation, and inflammation around the nerve.  Reducing activity during periods of pain.  Exercising and stretching to strengthen your abdomen and improve flexibility of your spine. Your caregiver may suggest losing weight if the extra weight makes the back pain worse.  Physical therapy.  Surgery to eliminate what is pressing or pinching the nerve, such as a bone spur or part of a herniated disk. HOME CARE INSTRUCTIONS   Only take over-the-counter or prescription medicines for pain or discomfort as directed by your caregiver.  Apply ice to the affected area for 20 minutes, 3-4 times a day for the first 48-72 hours. Then try heat in the same way.  Exercise, stretch, or perform your usual activities  if these do not aggravate your pain.  Attend physical therapy sessions as directed by your caregiver.  Keep all follow-up appointments as directed by your caregiver.  Do not wear high heels or shoes that do not provide proper support.  Check your mattress to see if it is too soft. A firm mattress may lessen your pain and discomfort. SEEK  IMMEDIATE MEDICAL CARE IF:   You lose control of your bowel or bladder (incontinence).  You have increasing weakness in the lower back, pelvis, buttocks, or legs.  You have redness or swelling of your back.  You have a burning sensation when you urinate.  You have pain that gets worse when you lie down or awakens you at night.  Your pain is worse than you have experienced in the past.  Your pain is lasting longer than 4 weeks.  You are suddenly losing weight without reason. MAKE SURE YOU:  Understand these instructions.  Will watch your condition.  Will get help right away if you are not doing well or get worse. Document Released: 09/14/2001 Document Revised: 03/21/2012 Document Reviewed: 01/30/2012 [facility] Patient Information 2015 [place], [place]. This information is not intended to replace advice given to you by your health care provider. Make sure you discuss any questions you have with your health care provider.

## 2014-09-23 ENCOUNTER — Emergency Department (HOSPITAL_COMMUNITY): Payer: Self-pay

## 2014-09-23 ENCOUNTER — Emergency Department (HOSPITAL_COMMUNITY)
Admission: EM | Admit: 2014-09-23 | Discharge: 2014-09-23 | Disposition: A | Discharge status: Home / Self Care | Payer: Self-pay | Attending: Emergency Medicine | Admitting: Emergency Medicine

## 2014-09-23 ENCOUNTER — Encounter (HOSPITAL_COMMUNITY): Payer: Self-pay | Admitting: Emergency Medicine

## 2014-09-23 DIAGNOSIS — Y9389 Activity, other specified: Secondary | ICD-10-CM | POA: Insufficient documentation

## 2014-09-23 DIAGNOSIS — Y998 Other external cause status: Secondary | ICD-10-CM | POA: Insufficient documentation

## 2014-09-23 DIAGNOSIS — Z7952 Long term (current) use of systemic steroids: Secondary | ICD-10-CM | POA: Insufficient documentation

## 2014-09-23 DIAGNOSIS — T1490XA Injury, unspecified, initial encounter: Secondary | ICD-10-CM

## 2014-09-23 DIAGNOSIS — Y9289 Other specified places as the place of occurrence of the external cause: Secondary | ICD-10-CM | POA: Insufficient documentation

## 2014-09-23 DIAGNOSIS — Z791 Long term (current) use of non-steroidal anti-inflammatories (NSAID): Secondary | ICD-10-CM | POA: Insufficient documentation

## 2014-09-23 DIAGNOSIS — S97122A Crushing injury of left lesser toe(s), initial encounter: Secondary | ICD-10-CM | POA: Insufficient documentation

## 2014-09-23 DIAGNOSIS — X58XXXA Exposure to other specified factors, initial encounter: Secondary | ICD-10-CM | POA: Insufficient documentation

## 2014-09-23 DIAGNOSIS — L089 Local infection of the skin and subcutaneous tissue, unspecified: Secondary | ICD-10-CM | POA: Insufficient documentation

## 2014-09-23 DIAGNOSIS — Z720 Tobacco use: Secondary | ICD-10-CM | POA: Insufficient documentation

## 2014-09-23 HISTORY — DX: Sciatica, unspecified side: M54.30

## 2014-09-23 MED ORDER — TRAMADOL HCL 50 MG PO TABS: 50.0000 mg | ORAL_TABLET | Freq: Four times a day (QID) | ORAL | Status: DC | PRN

## 2014-09-23 MED ORDER — CEPHALEXIN 500 MG PO CAPS: 500.0000 mg | ORAL_CAPSULE | Freq: Four times a day (QID) | ORAL | Status: DC

## 2014-09-23 MED ORDER — HYDROCODONE-ACETAMINOPHEN 5-325 MG PO TABS
2.0000 | ORAL_TABLET | Freq: Once | ORAL | Status: AC
  Administered 2014-09-23: 2 via ORAL
  Filled 2014-09-23: qty 2

## 2014-09-23 NOTE — ED Notes (Signed)
Called xray to inquire about eta.

## 2014-09-23 NOTE — ED Provider Notes (Signed)
CSN: [id number]     Arrival date & time 09/23/14  0258 History  This chart was scribed for non-physician practitioner, Alvina Chou, PA-C working with Jasper Riling. Alvino Chapel, MD by Evelene Croon, ED Scribe. This patient was seen in room TR07C/TR07C and the patient's care was started at 10:04 AM.    No chief complaint on file.    Patient is a 43 y.o. female presenting with toe pain. The history is provided by the patient. No language interpreter was used.  Toe Pain This is a new problem. The problem occurs constantly. The problem has not changed since onset.Pertinent negatives include no shortness of breath. She has tried nothing for the symptoms.     HPI Comments:  Emma Medina is a 43 y.o. female who presents to the Emergency Department complaining of moderate constant throbbing pain to her left 5th toe for the last 5 days. She believes she injured the toe but is unsure on what. She also reports removing and ingrown toenail from her left great toe.No alleviating factors or associated symptoms noted.   No past medical history on file. No past surgical history on file. No family history on file. History  Substance Use Topics  . Smoking status: Current Every Day Smoker  . Smokeless tobacco: Not on file  . Alcohol Use: No   OB History    No data available     Review of Systems  Constitutional: Negative for fever and chills.  Respiratory: Negative for shortness of breath and wheezing.   Musculoskeletal: Positive for arthralgias.  Skin: Negative for wound.  All other systems reviewed and are negative.     Allergies  Review of patient's allergies indicates no known allergies.  Home Medications   Prior to Admission medications   Medication Sig Start Date End Date Taking? Authorizing Provider  acetaminophen (TYLENOL) 500 MG tablet Take 2,000 mg by mouth every 6 (six) hours as needed for mild pain or moderate pain.    Historical Provider, MD  chlorpheniramine-HYDROcodone  (TUSSIONEX PENNKINETIC ER) 10-8 MG/5ML LQCR Take 5 mLs by mouth every 12 (twelve) hours as needed for cough. Patient not taking: Reported on 09/09/2014 06/09/14   Nehemiah Settle A Upstill, PA-C  cyclobenzaprine (FLEXERIL) 10 MG tablet Take 1 tablet (10 mg total) by mouth 2 (two) times daily as needed for muscle spasms. 09/09/14   Hannah Muthersbaugh, PA-C  ibuprofen (ADVIL,MOTRIN) 800 MG tablet Take 1 tablet (800 mg total) by mouth 3 (three) times daily. 09/09/14   Hannah Muthersbaugh, PA-C  oxyCODONE-acetaminophen (PERCOCET) 5-325 MG per tablet Take 1 tablet by mouth every 4 (four) hours as needed for moderate pain. 09/09/14   Hannah Muthersbaugh, PA-C  predniSONE (DELTASONE) 10 MG tablet Take 4 tablets (40 mg total) by mouth daily. 09/09/14   Hannah Muthersbaugh, PA-C   LMP 09/06/2014 Physical Exam  Constitutional: She is oriented to person, place, and time. She appears well-developed and well-nourished.  HENT:  Head: Normocephalic and atraumatic.  Eyes: Conjunctivae are normal.  Cardiovascular: Normal rate, regular rhythm and normal heart sounds.   Pulmonary/Chest: Effort normal and breath sounds normal. No respiratory distress. She has no wheezes.  Abdominal: She exhibits no distension.  Musculoskeletal:  Left little toe TTP; No obvious deformity, bruising or wound noted  Left great toe TTP of medial lateral side with mild erythema of affected area; No wound or deformity.  Neurological: She is alert and oriented to person, place, and time.  Skin: Skin is warm and dry.  Psychiatric: She has a  normal mood and affect.  Nursing note and vitals reviewed.   ED Course  Procedures   DIAGNOSTIC STUDIES:  Oxygen Saturation is 100% on RA, normal by my interpretation.    COORDINATION OF CARE:  10:11 AM Discussed treatment plan with pt at bedside and pt agreed to plan.  Labs Review Labs Reviewed - No data to display  Imaging Review Dg Toe 5th Left  09/23/2014   CLINICAL DATA:  Left fifth toe pain  after injury. Struck toe on something.  EXAM: DG TOE 5TH LEFT  COMPARISON:  None.  FINDINGS: No fracture or dislocation of the fifth toe. The alignment is maintained. No erosions or periosteal reaction. Question mild soft tissue edema, no radiopaque foreign body.  IMPRESSION: No fracture or dislocation left fifth toe.   Electronically Signed   By: Jeb Levering M.D.   On: 09/23/2014 11:06     EKG Interpretation None      MDM   Final diagnoses:  Injury  Crushing injury of fifth toe, left, initial encounter  Skin infection    11:10 AM Xray shows no fracture. Patient will have Tramadol and keflex for symptoms. No further evaluation needed at this time.   I personally performed the services described in this documentation, which was scribed in my presence. The recorded information has been reviewed and is accurate.   Alvina Chou, PA-C 09/23/14 [place] Alvino Chapel, MD 09/24/14 [phone]

## 2014-09-23 NOTE — ED Notes (Signed)
States she struck left 5th toe on "something" 2-3 days ago. Last night "dug out a hangnail" left great toe, c/o pain all toes on left foot. Pt is audibly wheezing, resp 26. States has had sore throat and cough x 3 days. Pt is a smoker.

## 2014-09-23 NOTE — Discharge Instructions (Signed)
Take keflex as directed until gone. Take Tramadol as needed for pain.

## 2014-09-30 ENCOUNTER — Encounter (HOSPITAL_COMMUNITY): Payer: Self-pay | Admitting: Emergency Medicine

## 2014-09-30 ENCOUNTER — Emergency Department (HOSPITAL_COMMUNITY)
Admission: EM | Admit: 2014-09-30 | Discharge: 2014-09-30 | Disposition: A | Discharge status: Home / Self Care | Payer: Self-pay | Attending: Emergency Medicine | Admitting: Emergency Medicine

## 2014-09-30 DIAGNOSIS — L03032 Cellulitis of left toe: Secondary | ICD-10-CM | POA: Insufficient documentation

## 2014-09-30 DIAGNOSIS — Z720 Tobacco use: Secondary | ICD-10-CM | POA: Insufficient documentation

## 2014-09-30 DIAGNOSIS — Z79899 Other long term (current) drug therapy: Secondary | ICD-10-CM | POA: Insufficient documentation

## 2014-09-30 MED ORDER — HYDROCODONE-ACETAMINOPHEN 5-325 MG PO TABS: 1.0000 | ORAL_TABLET | Freq: Four times a day (QID) | ORAL | Status: DC | PRN

## 2014-09-30 MED ORDER — SULFAMETHOXAZOLE-TRIMETHOPRIM 800-160 MG PO TABS: 1.0000 | ORAL_TABLET | Freq: Two times a day (BID) | ORAL | Status: DC

## 2014-09-30 MED ORDER — NAPROXEN 500 MG PO TABS: 500.0000 mg | ORAL_TABLET | Freq: Two times a day (BID) | ORAL | Status: DC | PRN

## 2014-09-30 MED ORDER — LIDOCAINE HCL (PF) 1 % IJ SOLN
10.0000 mL | Freq: Once | INTRAMUSCULAR | Status: AC
  Administered 2014-09-30: 10 mL
  Filled 2014-09-30: qty 10

## 2014-09-30 NOTE — ED Notes (Signed)
I&D kit and lidocaine at bedside. 

## 2014-09-30 NOTE — ED Notes (Signed)
Pt sts left great toe pain after having ingrown toenail removed; pt sts taking meds but still having pain and some swelling

## 2014-09-30 NOTE — ED Provider Notes (Signed)
CSN: [id number]     Arrival date & time 09/30/14  0932 History  This chart was scribed for non-physician practitioner, Zacarias Pontes, PA-C working with Artis Delay, MD by Frederich Balding, ED scribe. This patient was seen in room TR06C/TR06C and the patient's care was started at 9:47 AM.   Chief Complaint  Patient presents with  . Toe Pain   Patient is a 43 y.o. female presenting with toe pain. The history is provided by the patient. No language interpreter was used.  Toe Pain This is a new problem. The current episode started more than 2 days ago. The problem occurs constantly. The problem has been gradually worsening. Pertinent negatives include no chest pain, no abdominal pain and no shortness of breath. The symptoms are aggravated by walking (bearing weight). The symptoms are relieved by narcotics (ultram, muscle relaxer). Treatments tried: ultram, muscle relaxer. The treatment provided mild relief.    HPI Comments: Emma Medina is a 43 y.o. female who presents to the Emergency Department complaining of worsening, constant, throbbing left great toe pain with associated swelling and mild erythema that started one week ago. Pain does not radiate. Denies drainage from the toe. States she removed an ingrown toenail herself and came into the ED on 09/23/14 due to pain and swelling. She was discharged home with keflex and ultram. Pt states she has been taking double the dosage on her antibiotic and now only has one pill left. States the ultram helps with pain in combination with flexeril she has at home. Bearing weight worsens the pain. Denies red streaking or warmth. Denies fever, chest pain, SOB, abdominal pain, nausea, emesis, diarrhea, constipation, weakness, numbness or tingling.   Past Medical History  Diagnosis Date  . Sciatica    History reviewed. No pertinent past surgical history. History reviewed. No pertinent family history. History  Substance Use Topics  . Smoking status:  Current Every Day Smoker  . Smokeless tobacco: Not on file  . Alcohol Use: No   OB History    No data available     Review of Systems  Constitutional: Negative for fever.  Respiratory: Negative for shortness of breath.   Cardiovascular: Negative for chest pain.  Gastrointestinal: Negative for nausea, vomiting, abdominal pain, diarrhea and constipation.  Musculoskeletal: Positive for joint swelling (L great toe) and arthralgias (L great toe).  Skin: Positive for color change (L great toe erythema).  Neurological: Negative for weakness and numbness.  10 systems reviewed and are negative for acute changes except as noted in the HPI.  Allergies  Review of patient's allergies indicates no known allergies.  Home Medications   Prior to Admission medications   Medication Sig Start Date End Date Taking? Authorizing Provider  cephALEXin (KEFLEX) 500 MG capsule Take 1 capsule (500 mg total) by mouth 4 (four) times daily. 09/23/14   Alvina Chou, PA-C  chlorpheniramine-HYDROcodone (TUSSIONEX PENNKINETIC ER) 10-8 MG/5ML LQCR Take 5 mLs by mouth every 12 (twelve) hours as needed for cough. Patient not taking: Reported on 09/09/2014 06/09/14   Nehemiah Settle A Upstill, PA-C  cyclobenzaprine (FLEXERIL) 10 MG tablet Take 1 tablet (10 mg total) by mouth 2 (two) times daily as needed for muscle spasms. 09/09/14   Hannah Muthersbaugh, PA-C  ibuprofen (ADVIL,MOTRIN) 800 MG tablet Take 1 tablet (800 mg total) by mouth 3 (three) times daily. 09/09/14   Hannah Muthersbaugh, PA-C  oxyCODONE-acetaminophen (PERCOCET) 5-325 MG per tablet Take 1 tablet by mouth every 4 (four) hours as needed for moderate pain. 09/09/14  Hannah Muthersbaugh, PA-C  predniSONE (DELTASONE) 10 MG tablet Take 4 tablets (40 mg total) by mouth daily. Patient not taking: Reported on 09/23/2014 09/09/14   Jarrett Soho Muthersbaugh, PA-C  traMADol (ULTRAM) 50 MG tablet Take 1 tablet (50 mg total) by mouth every 6 (six) hours as needed. 09/23/14   Kaitlyn  Szekalski, PA-C   BP 157/100 mmHg  Pulse 85  Temp(Src) 98.2 F (36.8 C) (Oral)  Resp 20  SpO2 99%  LMP 09/06/2014   Physical Exam  Constitutional: She is oriented to person, place, and time. Vital signs are normal. She appears well-developed and well-nourished.  Non-toxic appearance. No distress.  Afebrile, nontoxic, well-appearing  HENT:  Head: Normocephalic and atraumatic.  Mouth/Throat: Mucous membranes are normal.  Eyes: Conjunctivae and EOM are normal.  Neck: Neck supple.  Cardiovascular: Normal rate and intact distal pulses.   Distal pulses intact.  Pulmonary/Chest: Effort normal. No respiratory distress.  Abdominal: Normal appearance. She exhibits no distension.  Musculoskeletal: Normal range of motion.  Left great toe mildly erythematous in pulp, which is mildly tender on the medial aspect of the nailfold. No induration or warmth, no drainage. Hypertrophy yellowed nails noted. Sensation grossly intact. No red streaking.  Neurological: She is alert and oriented to person, place, and time. No sensory deficit.  Skin: Skin is warm and dry. There is erythema.  L great toe erythema as noted above  Psychiatric: She has a normal mood and affect. Her behavior is normal.  Nursing note and vitals reviewed.   ED Course  INCISION AND DRAINAGE Date/Time: 09/30/2014 10:30 AM Performed by: Shann Medal, Millicent Blazejewski STRUPP Authorized by: Corine Shelter Consent: Verbal consent obtained. Risks and benefits: risks, benefits and alternatives were discussed Consent given by: patient Patient understanding: patient states understanding of the procedure being performed Patient consent: the patient's understanding of the procedure matches consent given Patient identity confirmed: verbally with patient Type: abscess (paronychia L great toe) Body area: lower extremity Location details: left big toe Anesthesia: local infiltration Local anesthetic: lidocaine 1% without  epinephrine Anesthetic total: 4.5 ml Patient sedated: no Scalpel size: 11 Needle gauge: 25. Incision type: single straight Complexity: simple Drainage: bloody Drainage amount: scant Wound treatment: wound left open Packing material: none Patient tolerance: Patient tolerated the procedure well with no immediate complications Comments: Small incision made to medial nail fold, bloody drainage expelled, no ingrown toenail removal performed   (including critical care time)  DIAGNOSTIC STUDIES: Oxygen Saturation is 99% on RA, normal by my interpretation.    COORDINATION OF CARE: 9:54 AM-Discussed treatment plan which includes I&D, bactrim, warm soaks and continuing keflex with pt at bedside and pt agreed to plan.   Labs Review Labs Reviewed - No data to display  Imaging Review No results found.   EKG Interpretation None      MDM   Final diagnoses:  Paronychia of great toe, left    43 y.o. female with L great toe paryonychia, given keflex one week ago, took more than directed therefore pt almost finished with this script. I&D performed with no purulent drainage, no toenail remains to be excised, therefore bandaged up and discussed warm soaks, dressing changes, wound recheck at urgent care in 2 days and [place] and wellness referral for f/up in 1wk. Will give bactrim to cover for skin bacteria that keflex may not have fully covered for. Does not appear to be flexor tenosynovitis or cellulitis, doesn't appear to be a felon infection. Pt didn't want pain meds here but will give  some for home. I explained the diagnosis and have given explicit precautions to return to the ER including for any other new or worsening symptoms. The patient understands and accepts the medical plan as it's been dictated and I have answered their questions. Discharge instructions concerning home care and prescriptions have been given. The patient is STABLE and is discharged to home in good condition.  BP  157/100 mmHg  Pulse 85  Temp(Src) 98.2 F (36.8 C) (Oral)  Resp 20  SpO2 99%  LMP 09/06/2014  Meds ordered this encounter  Medications  . lidocaine (PF) (XYLOCAINE) 1 % injection 10 mL    Sig:   . HYDROcodone-acetaminophen (NORCO) 5-325 MG per tablet    Sig: Take 1-2 tablets by mouth every 6 (six) hours as needed for severe pain.    Dispense:  6 tablet    Refill:  0    Order Specific Question:  Supervising Provider    Answer:  Noemi Chapel D [[id number]]  . naproxen (NAPROSYN) 500 MG tablet    Sig: Take 1 tablet (500 mg total) by mouth 2 (two) times daily as needed for mild pain, moderate pain or headache (TAKE WITH MEALS.).    Dispense:  20 tablet    Refill:  0    Order Specific Question:  Supervising Provider    Answer:  Noemi Chapel D [[id number]]  . sulfamethoxazole-trimethoprim (BACTRIM DS,SEPTRA DS) 800-160 MG per tablet    Sig: Take 1 tablet by mouth 2 (two) times daily.    Dispense:  14 tablet    Refill:  0    Order Specific Question:  Supervising Provider    Answer:  Noemi Chapel D [[id number]]      I personally performed the services described in this documentation, which was scribed in my presence. The recorded information has been reviewed and is accurate.  Patty Sermons [place], [place] 09/30/14 [place], MD 10/01/14 [phone]

## 2014-09-30 NOTE — Discharge Instructions (Signed)
Take the antibiotic Bactrim as directed, and until completed. Continue your usual home medications. Get plenty of rest, drink plenty of fluids, and perform warm water soaks for 5-10 mins in Half-strength hydrogen peroxide and salt, 4 times daily x 5-7 days. Change the bandage twice daily. Keep it clean and dry. Please followup with your primary doctor, an urgent care or the emergency department for wound check in 2 days. Watch for increasing pain, swelling, drainage/pus, or fever, and return to the emergency department if any of these symptoms occur.   Paronychia  Paronychia is an infection of the skin caused by germs. It happens by the fingernail or toenail. You can avoid it by not:  Pulling on hangnails.  Nail biting.  Thumb sucking.  Cutting fingernails and toenails too short.  Cutting the skin at the base and sides of the fingernail or toenail (cuticle). HOME CARE  Keep the fingers or toes very dry. Put rubber gloves over cotton gloves when putting hands in water.  Keep the wound clean and bandaged (dressed) as told by your doctor.  Soak the fingers or toes in warm water for 15 to 20 minutes. Soak them 3 to 4 times per day for germ infections. Fungal infections are difficult to treat. Fungal infections often require treatment for a long time.  Only take medicine as told by your doctor. GET HELP RIGHT AWAY IF:   You have redness, puffiness (swelling), or pain that gets worse.  You see yellowish-white fluid (pus) coming from the wound.  You have a fever.  You have a bad smell coming from the wound or bandage. MAKE SURE YOU:  Understand these instructions.  Will watch your condition.  Will get help if you are not doing well or get worse. Document Released: 09/08/2009 Document Revised: 12/13/2011 Document Reviewed: 09/08/2009 [facility] Patient Information 2015 [place], [place]. This information is not intended to replace advice given to you by your health care provider. Make  sure you discuss any questions you have with your health care provider.  Infected Ingrown Toenail An infected ingrown toenail occurs when the nail edge grows into the skin and bacteria invade the area. Symptoms include pain, tenderness, swelling, and pus drainage from the edge of the nail. Poorly fitting shoes, minor injuries, and improper cutting of the toenail may also contribute to the problem. You should cut your toenails squarely instead of rounding the edges. Do not cut them too short. Avoid tight or pointed toe shoes. Sometimes the ingrown portion of the nail must be removed. If your toenail is removed, it can take 3-4 months for it to re-grow. HOME CARE INSTRUCTIONS   Soak your infected toe in warm water for 20-30 minutes, 2 to 3 times a day.  Packing or dressings applied to the area should be changed daily.  Take medicine as directed and finish them.  Reduce activities and keep your foot elevated when able to reduce swelling and discomfort. Do this until the infection gets better.  Wear sandals or go barefoot as much as possible while the infected area is sensitive.  See your caregiver for follow-up care in 2-3 days if the infection is not better. SEEK MEDICAL CARE IF:  Your toe is becoming more red, swollen or painful. MAKE SURE YOU:   Understand these instructions.  Will watch your condition.  Will get help right away if you are not doing well or get worse. Document Released: 10/28/2004 Document Revised: 12/13/2011 Document Reviewed: 09/16/2008 [facility] Patient Information 2015 [place], [place]. This  information is not intended to replace advice given to you by your health care provider. Make sure you discuss any questions you have with your health care provider. Emergency Department Resource Guide 1) Find a Doctor and Pay Out of Pocket Although you won't have to find out who is covered by your insurance plan, it is a good idea to ask around and get recommendations. You will  then need to call the office and see if the doctor you have chosen will accept you as a new patient and what types of options they offer for patients who are self-pay. Some doctors offer discounts or will set up payment plans for their patients who do not have insurance, but you will need to ask so you aren't surprised when you get to your appointment.  2) Contact Your Local Health Department Not all health departments have doctors that can see patients for sick visits, but many do, so it is worth a call to see if yours does. If you don't know where your local health department is, you can check in your phone book. The CDC also has a tool to help you locate your state's health department, and many state websites also have listings of all of their local health departments.  3) Find a [facility] If your illness is not likely to be very severe or complicated, you may want to try a walk in clinic. These are popping up all over the country in pharmacies, drugstores, and shopping centers. They're usually staffed by nurse practitioners or physician assistants that have been trained to treat common illnesses and complaints. They're usually fairly quick and inexpensive. However, if you have serious medical issues or chronic medical problems, these are probably not your best option.  No Primary Care Doctor: - Call Health Connect at  [phone] - they can help you locate a primary care doctor that  accepts your insurance, provides certain services, etc. - Physician Referral Service- [phone]  Chronic Pain Problems: Organization         Address  Phone   Notes  [facility]  [phone] Patients need to be referred by their primary care doctor.   Medication Assistance: Organization         Address  Phone   Notes  [facility] Medication [facility] [address] [place], [place] [address] [phone] --Must be a resident of [facility] -- Must have NO  insurance coverage whatsoever (no Medicaid/ Medicare, etc.) -- The pt. MUST have a primary care doctor that directs their care regularly and follows them in the community   [facility]  [phone]   [employer]  [phone]    Agencies that provide inexpensive medical care: Organization         Address  Phone   Notes  [place]  [phone]   [facility]    [phone]   [facility] [place], [place] [address] [phone]   [place] [address] [place] [phone]   [facility]anned [facility]    [phone]   [facility]    [phone]   [place] and [place] [address], [place] Phone:  [phone], Fax:  [phone] Hours of Operation:  9 am - 6 pm, M-F.  Also accepts Medicaid/Medicare and self-pay.  [facility] for [place] [address], [address], [place] Phone: [phone], Fax: [phone]. Hours of Operation:  8:30 am - 5:30 pm, M-F.  Also accepts Medicaid and self-pay.  [facility] [place] [address], [place] Phone: [phone]   [place], [place], [place] [phone], Ext. [phone] Mondays & Thursdays: 7-9 AM.  First 15 patients are seen on a first come, first serve basis.    [place] Providers:  Organization         Address  Phone   Notes  [facility] [address], [place] [phone] Also accepts self-pay patients.  [facility] [address], [place]  [phone]   [place], [address], [place] [phone]   [facility] [address], [place] [phone]   Lucianne Lei [address], [place]   [phone] Only accepts [place] patients after they have  their name applied to their card.   Self-Pay (no insurance) in [facility]:  Organization         Address  Phone   Notes  Sickle Cell Patients, [facility] [place] [phone]   [facility] [place] [phone]   [facility] [place]  [place], [place], [place] [phone]   [facility]/Dr. Osei-Bonsu  [address] [place] or [address], [place] [phone] Phone number for both [place] and [place] locations is the same.  [facility]gent [facility] and [facility] [address] [place] [phone]   [facility] [address] [place] or [phone]   [facility] [address] [place] [phone], phone; [phone], fax Sees patients 1st and 3rd Saturday of every month.  Must not qualify for public or private insurance (i.e. Medicaid, Medicare, [facility] Choice, Veterans' Benefits)  Household income should be no more than 200% of the poverty level The clinic cannot treat you if you are pregnant or think you are pregnant  Sexually transmitted diseases are not treated at the clinic.

## 2014-10-10 ENCOUNTER — Emergency Department (INDEPENDENT_AMBULATORY_CARE_PROVIDER_SITE_OTHER)
Admission: EM | Admit: 2014-10-10 | Discharge: 2014-10-10 | Disposition: A | Discharge status: Home / Self Care | Payer: Self-pay | Source: Home / Self Care | Attending: Family Medicine | Admitting: Family Medicine

## 2014-10-10 ENCOUNTER — Encounter (HOSPITAL_COMMUNITY): Payer: Self-pay | Admitting: Emergency Medicine

## 2014-10-10 DIAGNOSIS — M79675 Pain in left toe(s): Secondary | ICD-10-CM

## 2014-10-10 DIAGNOSIS — G8929 Other chronic pain: Secondary | ICD-10-CM

## 2014-10-10 MED ORDER — TERBINAFINE HCL 250 MG PO TABS: 250.0000 mg | ORAL_TABLET | Freq: Every day | ORAL | Status: DC

## 2014-10-10 NOTE — ED Provider Notes (Signed)
CSN: [id number]     Arrival date & time 10/10/14  1856 History   First MD Initiated Contact with Patient 10/10/14 1954     Chief Complaint  Patient presents with  . Foot Pain   (Consider location/radiation/quality/duration/timing/severity/associated sxs/prior Treatment) Patient is a 44 y.o. female presenting with lower extremity pain. The history is provided by the patient.  Foot Pain This is a recurrent problem. The current episode started more than 1 week ago (sx for 3 wks , seen twice in ER and given abx, , now with recurrent pain.). The problem has been gradually worsening.    Past Medical History  Diagnosis Date  . Sciatica    History reviewed. No pertinent past surgical history. History reviewed. No pertinent family history. History  Substance Use Topics  . Smoking status: Current Every Day Smoker  . Smokeless tobacco: Not on file  . Alcohol Use: No   OB History    No data available     Review of Systems  Constitutional: Negative.   Musculoskeletal: Positive for gait problem. Negative for joint swelling.  Skin: Negative.     Allergies  Review of patient's allergies indicates no known allergies.  Home Medications   Prior to Admission medications   Medication Sig Start Date End Date Taking? Authorizing Provider  cephALEXin (KEFLEX) 500 MG capsule Take 1 capsule (500 mg total) by mouth 4 (four) times daily. 09/23/14   Alvina Chou, PA-C  chlorpheniramine-HYDROcodone (TUSSIONEX PENNKINETIC ER) 10-8 MG/5ML LQCR Take 5 mLs by mouth every 12 (twelve) hours as needed for cough. Patient not taking: Reported on 09/09/2014 06/09/14   Nehemiah Settle A Upstill, PA-C  cyclobenzaprine (FLEXERIL) 10 MG tablet Take 1 tablet (10 mg total) by mouth 2 (two) times daily as needed for muscle spasms. 09/09/14   Hannah Muthersbaugh, PA-C  HYDROcodone-acetaminophen (NORCO) 5-325 MG per tablet Take 1-2 tablets by mouth every 6 (six) hours as needed for severe pain. 09/30/14   Mercedes Strupp  Camprubi-Soms, PA-C  ibuprofen (ADVIL,MOTRIN) 800 MG tablet Take 1 tablet (800 mg total) by mouth 3 (three) times daily. 09/09/14   Hannah Muthersbaugh, PA-C  naproxen (NAPROSYN) 500 MG tablet Take 1 tablet (500 mg total) by mouth 2 (two) times daily as needed for mild pain, moderate pain or headache (TAKE WITH MEALS.). 09/30/14   Mercedes Strupp Camprubi-Soms, PA-C  oxyCODONE-acetaminophen (PERCOCET) 5-325 MG per tablet Take 1 tablet by mouth every 4 (four) hours as needed for moderate pain. 09/09/14   Hannah Muthersbaugh, PA-C  predniSONE (DELTASONE) 10 MG tablet Take 4 tablets (40 mg total) by mouth daily. Patient not taking: Reported on 09/23/2014 09/09/14   Jarrett Soho Muthersbaugh, PA-C  sulfamethoxazole-trimethoprim (BACTRIM DS,SEPTRA DS) 800-160 MG per tablet Take 1 tablet by mouth 2 (two) times daily. 09/30/14   Mercedes Strupp Camprubi-Soms, PA-C  terbinafine (LAMISIL) 250 MG tablet Take 1 tablet (250 mg total) by mouth daily. 10/10/14   Billy Fischer, MD  traMADol (ULTRAM) 50 MG tablet Take 1 tablet (50 mg total) by mouth every 6 (six) hours as needed. 09/23/14   Kaitlyn Szekalski, PA-C   BP 151/102 mmHg  Pulse 84  Resp 20  SpO2 100%  LMP 09/26/2014 Physical Exam  Constitutional: She is oriented to person, place, and time. She appears well-developed and well-nourished.  Musculoskeletal: She exhibits tenderness.       Feet:  Neurological: She is alert and oriented to person, place, and time.  Skin: Skin is warm and dry.  Nursing note and vitals reviewed.  ED Course  Procedures (including critical care time) Labs Review Labs Reviewed - No data to display  Imaging Review No results found.   MDM   1. Chronic toe pain, left foot       Billy Fischer, MD 10/13/14 1035

## 2014-10-10 NOTE — ED Notes (Signed)
Pt c/o left great toe pain  For 3 wks.  Pt has been seen in ED twice and given two different antibiotics with no relief.

## 2014-10-10 NOTE — Discharge Instructions (Signed)
Warm epsom salts soak, wear shoe for comfort, take medicine and see specialist as soon as possible.

## 2015-02-05 ENCOUNTER — Encounter (HOSPITAL_COMMUNITY): Payer: Self-pay | Admitting: *Deleted

## 2015-02-05 ENCOUNTER — Emergency Department (HOSPITAL_COMMUNITY)
Admission: EM | Admit: 2015-02-05 | Discharge: 2015-02-05 | Disposition: A | Discharge status: Home / Self Care | Payer: Self-pay | Attending: Emergency Medicine | Admitting: Emergency Medicine

## 2015-02-05 ENCOUNTER — Emergency Department (HOSPITAL_COMMUNITY): Payer: Self-pay

## 2015-02-05 DIAGNOSIS — Z79899 Other long term (current) drug therapy: Secondary | ICD-10-CM | POA: Insufficient documentation

## 2015-02-05 DIAGNOSIS — M5442 Lumbago with sciatica, left side: Secondary | ICD-10-CM

## 2015-02-05 DIAGNOSIS — R51 Headache: Secondary | ICD-10-CM | POA: Insufficient documentation

## 2015-02-05 DIAGNOSIS — Y998 Other external cause status: Secondary | ICD-10-CM | POA: Insufficient documentation

## 2015-02-05 DIAGNOSIS — Z720 Tobacco use: Secondary | ICD-10-CM | POA: Insufficient documentation

## 2015-02-05 DIAGNOSIS — S3992XA Unspecified injury of lower back, initial encounter: Secondary | ICD-10-CM | POA: Insufficient documentation

## 2015-02-05 DIAGNOSIS — Z7952 Long term (current) use of systemic steroids: Secondary | ICD-10-CM | POA: Insufficient documentation

## 2015-02-05 DIAGNOSIS — M5136 Other intervertebral disc degeneration, lumbar region: Secondary | ICD-10-CM | POA: Insufficient documentation

## 2015-02-05 DIAGNOSIS — M5432 Sciatica, left side: Secondary | ICD-10-CM | POA: Insufficient documentation

## 2015-02-05 DIAGNOSIS — Y92410 Unspecified street and highway as the place of occurrence of the external cause: Secondary | ICD-10-CM | POA: Insufficient documentation

## 2015-02-05 DIAGNOSIS — Y9389 Activity, other specified: Secondary | ICD-10-CM | POA: Insufficient documentation

## 2015-02-05 DIAGNOSIS — M51369 Other intervertebral disc degeneration, lumbar region without mention of lumbar back pain or lower extremity pain: Secondary | ICD-10-CM

## 2015-02-05 DIAGNOSIS — Z791 Long term (current) use of non-steroidal anti-inflammatories (NSAID): Secondary | ICD-10-CM | POA: Insufficient documentation

## 2015-02-05 DIAGNOSIS — Z792 Long term (current) use of antibiotics: Secondary | ICD-10-CM | POA: Insufficient documentation

## 2015-02-05 MED ORDER — KETOROLAC TROMETHAMINE 60 MG/2ML IM SOLN
60.0000 mg | Freq: Once | INTRAMUSCULAR | Status: AC
  Administered 2015-02-05: 60 mg via INTRAMUSCULAR
  Filled 2015-02-05: qty 2

## 2015-02-05 NOTE — ED Notes (Signed)
AT time of D/C Pt .stated " I am not getting any meds for my problem . The last time I was here I was given prednisone and others." Pt was instructed the PA writes the RX. Pt was instructed PA was in a room with another PT at this time. Pt instructed will ask PA to talk to PT about getting D/C meds.

## 2015-02-05 NOTE — Discharge Instructions (Signed)
Return to the emergency room with worsening of symptoms, new symptoms or with symptoms that are concerning , especially fevers, loss of control of bladder or bowels, numbness or tingling around genital region or anus, weakness. °RICE: Rest, Ice (three cycles of 20 mins on, 20mins off at least twice a day), compression/brace, elevation. Heating pad works well for back pain. °Ibuprofen 400mg (2 tablets 200mg) every 5-6 hours for 3-5 days. °Follow up with PCP/orthopedist if symptoms worsen or are persistent. °Read below information and follow recommendations. ° °Back Injury Prevention °Back injuries can be extremely painful and difficult to heal. After having one back injury, you are much more likely to experience another later on. It is important to learn how to avoid injuring or re-injuring your back. The following tips can help you to prevent a back injury. °PHYSICAL FITNESS °· Exercise regularly and try to develop good tone in your abdominal muscles. Your abdominal muscles provide a lot of the support needed by your back. °· Do aerobic exercises (walking, jogging, biking, swimming) regularly. °· Do exercises that increase balance and strength (tai chi, yoga) regularly. This can decrease your risk of falling and injuring your back. °· Stretch before and after exercising. °· Maintain a healthy weight. The more you weigh, the more stress is placed on your back. For every pound of weight, 10 times that amount of pressure is placed on the back. °DIET °· Talk to your caregiver about how much calcium and vitamin D you need per day. These nutrients help to prevent weakening of the bones (osteoporosis). Osteoporosis can cause broken (fractured) bones that lead to back pain. °· Include good sources of calcium in your diet, such as dairy products, green, leafy vegetables, and products with calcium added (fortified). °· Include good sources of vitamin D in your diet, such as milk and foods that are fortified with vitamin  D. °· Consider taking a nutritional supplement or a multivitamin if needed. °· Stop smoking if you smoke. °POSTURE °· Sit and stand up straight. Avoid leaning forward when you sit or hunching over when you stand. °· Choose chairs with good low back (lumbar) support. °· If you work at a desk, sit close to your work so you do not need to lean over. Keep your chin tucked in. Keep your neck drawn back and elbows bent at a right angle. Your arms should look like the letter "L." °· Sit high and close to the steering wheel when you drive. Add a lumbar support to your car seat if needed. °· Avoid sitting or standing in one position for too long. Take breaks to get up, stretch, and walk around at least once every hour. Take breaks if you are driving for long periods of time. °· Sleep on your side with your knees slightly bent, or sleep on your back with a pillow under your knees. Do not sleep on your stomach. °LIFTING, TWISTING, AND REACHING °· Avoid heavy lifting, especially repetitive lifting. If you must do heavy lifting: °¨ Stretch before lifting. °¨ Work slowly. °¨ Rest between lifts. °¨ Use carts and dollies to move objects when possible. °¨ Make several small trips instead of carrying 1 heavy load. °¨ Ask for help when you need it. °¨ Ask for help when moving big, awkward objects. °· Follow these steps when lifting: °¨ Stand with your feet shoulder-width apart. °¨ Get as close to the object as you can. Do not try to pick up heavy objects that are far from your body. °¨   Use handles or lifting straps if they are available.  Bend at your knees. Squat down, but keep your heels off the floor.  Keep your shoulders pulled back, your chin tucked in, and your back straight.  Lift the object slowly, tightening the muscles in your legs, abdomen, and buttocks. Keep the object as close to the center of your body as possible.  When you put a load down, use these same guidelines in reverse.  Do not:  Lift the object  above your waist.  Twist at the waist while lifting or carrying a load. Move your feet if you need to turn, not your waist.  Bend over without bending at your knees.  Avoid reaching over your head, across a table, or for an object on a high surface. OTHER TIPS  Avoid wet floors and keep sidewalks clear of ice to prevent falls.  Do not sleep on a mattress that is too soft or too hard.  Keep items that are used frequently within easy reach.  Put heavier objects on shelves at waist level and lighter objects on lower or higher shelves.  Find ways to decrease your stress, such as exercise, massage, or relaxation techniques. Stress can build up in your muscles. Tense muscles are more vulnerable to injury.  Seek treatment for depression or anxiety if needed. These conditions can increase your risk of developing back pain. SEEK MEDICAL CARE IF:  You injure your back.  You have questions about diet, exercise, or other ways to prevent back injuries. MAKE SURE YOU:  Understand these instructions.  Will watch your condition.  Will get help right away if you are not doing well or get worse. Document Released: 10/28/2004 Document Revised: 12/13/2011 Document Reviewed: 11/01/2011 [facility] Patient Information 2015 [place], [place]. This information is not intended to replace advice given to you by your health care provider. Make sure you discuss any questions you have with your health care provider.

## 2015-02-05 NOTE — ED Notes (Signed)
When Pa was available to see  PT . Pt was absent from room. Unable to locate Pt for PA to talk to.

## 2015-02-05 NOTE — ED Notes (Signed)
Declined W/C at D/C and was escorted to lobby by RN. 
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
## 2015-06-05 ENCOUNTER — Encounter (HOSPITAL_COMMUNITY): Payer: Self-pay | Admitting: Emergency Medicine

## 2015-06-05 ENCOUNTER — Emergency Department (INDEPENDENT_AMBULATORY_CARE_PROVIDER_SITE_OTHER)
Admission: EM | Admit: 2015-06-05 | Discharge: 2015-06-05 | Disposition: A | Discharge status: Home / Self Care | Payer: Self-pay | Source: Home / Self Care | Attending: Family Medicine | Admitting: Family Medicine

## 2015-06-05 DIAGNOSIS — L089 Local infection of the skin and subcutaneous tissue, unspecified: Secondary | ICD-10-CM

## 2015-06-05 MED ORDER — CEPHALEXIN 500 MG PO CAPS: 500.0000 mg | ORAL_CAPSULE | Freq: Four times a day (QID) | ORAL | Status: DC

## 2015-06-05 NOTE — ED Notes (Signed)
Pt has been having pain in her 2nd left toe since she had a pedicure done about three weeks ago.  The pain has become unbearable and she is unable to sleep.

## 2015-06-05 NOTE — ED Provider Notes (Signed)
CSN: [id number]     Arrival date & time 06/05/15  1326 History   First MD Initiated Contact with Patient 06/05/15 1417     Chief Complaint  Patient presents with  . Toe Pain    left 2nd   (Consider location/radiation/quality/duration/timing/severity/associated sxs/prior Treatment) HPI Comments: 44 year old female is complaining of left second toe pain for nearly 3 weeks. She states that she went to a nail salon almost 3 weeks ago and had some work done on her nails and the person working on her nails pulled part of an ingrown toenail out from her left second toe. Since that time she has gradually developed pain particularly over the distal portion of the toe. Now she is complaining of pain the length of the toe. The greatest amount of pain is in the distal phalanx. She denies any trauma. The pain is progressing. Nothing seems to make it better except for elevation. It is sometimes improved with off weight and elevation. It is worse when in it dependent position and when exposed to heat when sitting in a car. Pain is exacerbated with touch and ambulation. There is no radiation of pain to the toes or foot.   Past Medical History  Diagnosis Date  . Sciatica    History reviewed. No pertinent past surgical history. Family History  Problem Relation Age of Onset  . Family history unknown: Yes   Social History  Substance Use Topics  . Smoking status: Current Every Day Smoker -- 1.00 packs/day    Types: Cigarettes  . Smokeless tobacco: Never Used  . Alcohol Use: Yes     Comment: occasional   OB History    No data available     Review of Systems  Constitutional: Negative.   Cardiovascular: Negative for leg swelling.  Gastrointestinal: Negative.   Musculoskeletal: Negative for myalgias and joint swelling.  Skin: Negative for color change and rash.  Neurological: Negative for speech difficulty and light-headedness.  Hematological: Does not bruise/bleed easily.  All other systems  reviewed and are negative.   Allergies  Review of patient's allergies indicates no known allergies.  Home Medications   Prior to Admission medications   Medication Sig Start Date End Date Taking? Authorizing Provider  tiZANidine (ZANAFLEX) 2 MG tablet Take by mouth every 8 (eight) hours as needed for muscle spasms.   Yes Historical Provider, MD  cephALEXin (KEFLEX) 500 MG capsule Take 1 capsule (500 mg total) by mouth 4 (four) times daily. 06/05/15   Janne Napoleon, NP  cyclobenzaprine (FLEXERIL) 10 MG tablet Take 1 tablet (10 mg total) by mouth 2 (two) times daily as needed for muscle spasms. 09/09/14   Hannah Muthersbaugh, PA-C  HYDROcodone-acetaminophen (NORCO) 5-325 MG per tablet Take 1-2 tablets by mouth every 6 (six) hours as needed for severe pain. 09/30/14   Mercedes Camprubi-Soms, PA-C  ibuprofen (ADVIL,MOTRIN) 800 MG tablet Take 1 tablet (800 mg total) by mouth 3 (three) times daily. 09/09/14   Hannah Muthersbaugh, PA-C  naproxen (NAPROSYN) 500 MG tablet Take 1 tablet (500 mg total) by mouth 2 (two) times daily as needed for mild pain, moderate pain or headache (TAKE WITH MEALS.). 09/30/14   Mercedes Camprubi-Soms, PA-C  oxyCODONE-acetaminophen (PERCOCET) 5-325 MG per tablet Take 1 tablet by mouth every 4 (four) hours as needed for moderate pain. 09/09/14   Hannah Muthersbaugh, PA-C  terbinafine (LAMISIL) 250 MG tablet Take 1 tablet (250 mg total) by mouth daily. 10/10/14   Billy Fischer, MD  traMADol (ULTRAM) 50 MG tablet  Take 1 tablet (50 mg total) by mouth every 6 (six) hours as needed. 09/23/14   Alvina Chou, PA-C   Meds Ordered and Administered this Visit  Medications - No data to display  BP 131/85 mmHg  Pulse 67  Temp(Src) 97.5 F (36.4 C) (Oral)  Resp 20  SpO2 100%  LMP 05/07/2015 (Within Days) No data found.   Physical Exam  Constitutional: She appears well-developed and well-nourished. No distress.  Eyes: EOM are normal.  Neck: Normal range of motion. Neck supple.   Cardiovascular: Normal rate.   Pulmonary/Chest: Effort normal. No respiratory distress.  Musculoskeletal: She exhibits no edema.  The second toe has very minimal swelling. No erythema appreciated. There is no fluctuance or induration. There is no drainage or bleeding surrounding the nail. There is no evidence of paronychia. The nail is intact and attached to the nailbed. The entire length of the toe is tender to light palpation with the worst tenderness being over the nail itself.  Neurological: She is alert. She exhibits normal muscle tone.  Skin: Skin is warm and dry.  Psychiatric: She has a normal mood and affect.  Nursing note and vitals reviewed.   ED Course  Procedures (including critical care time)  Labs Review Labs Reviewed - No data to display  Imaging Review No results found.   Visual Acuity Review  Right Eye Distance:   Left Eye Distance:   Bilateral Distance:    Right Eye Near:   Left Eye Near:    Bilateral Near:         MDM   1. Toe infection     Wound care instructions Warm or tepid soaks Keflex as dir F/U with [facility] as needed. Elevate  Janne Napoleon, NP 06/05/15 1438

## 2015-06-05 NOTE — Discharge Instructions (Signed)
Toe infection  When an infection is around the nail, it is called a paronychia. When it appears over the tip of the finger, it is called a felon. These infections are due to minor injuries or cracks in the skin. If they are not treated properly, they can lead to bone infection and permanent damage to the fingernail. Incision and drainage is necessary if a pus pocket (an abscess) has formed. Antibiotics and pain medicine may also be needed. Keep your hand elevated for the next 2-3 days to reduce swelling and pain. If a pack was placed in the abscess, it should be removed in 1-2 days by your caregiver. Soak the finger in warm or tepid water for 20 minutes 4 times daily to help promote drainage. Keep the feet as dry as possible.  See your caregiver for follow-up care as recommended.  HOME CARE INSTRUCTIONS   Keep wound clean, dry and dressed as suggested by your caregiver.  Soak in warm salt water for fifteen minutes, four times per day for bacterial infections.  Your caregiver will prescribe an antibiotic if a bacterial infection is suspected. Take antibiotics as directed and finish the prescription, even if the problem appears to be improving before the medicine is gone.  Only take over-the-counter or prescription medicines for pain, discomfort, or fever as directed by your caregiver. SEEK IMMEDIATE MEDICAL CARE IF:  There is redness, swelling, or increasing pain in the wound.  Pus or any other unusual drainage is coming from the wound.  An unexplained oral temperature above 102 F (38.9 C) develops.  You notice a foul smell coming from the wound or dressing. MAKE SURE YOU:   Understand these instructions.  Monitor your condition.  Contact your caregiver if you are getting worse or not improving. Document Released: 10/28/2004 Document Revised: 12/13/2011 Document Reviewed: 10/24/2008 [facility] Patient Information 2015 [place], [place]. This information is not intended to replace advice  given to you by your health care provider. Make sure you discuss any questions you have with your health care provider.

## 2015-06-19 ENCOUNTER — Emergency Department (INDEPENDENT_AMBULATORY_CARE_PROVIDER_SITE_OTHER)
Admission: EM | Admit: 2015-06-19 | Discharge: 2015-06-19 | Disposition: A | Discharge status: Home / Self Care | Payer: Self-pay | Source: Home / Self Care | Attending: Family Medicine | Admitting: Family Medicine

## 2015-06-19 ENCOUNTER — Encounter (HOSPITAL_COMMUNITY): Payer: Self-pay | Admitting: *Deleted

## 2015-06-19 DIAGNOSIS — L600 Ingrowing nail: Secondary | ICD-10-CM

## 2015-06-19 MED ORDER — CEPHALEXIN 500 MG PO CAPS: 500.0000 mg | ORAL_CAPSULE | Freq: Four times a day (QID) | ORAL | Status: DC

## 2015-06-19 NOTE — ED Notes (Signed)
Pt reports     Symptoms   Of      painfull     l   2  nd  Toe      X   2days          Seen    sev  Weeks  Ago  For   Toe  Infection       Took anti biotics         Got better      And  Then  Symptoms  returnes

## 2015-06-19 NOTE — ED Provider Notes (Signed)
CSN: [id number]     Arrival date & time 06/19/15  1437 History   None    Chief Complaint  Patient presents with  . Toe Pain   (Consider location/radiation/quality/duration/timing/severity/associated sxs/prior Treatment) Patient is a 44 y.o. female presenting with toe pain. The history is provided by the patient.  Toe Pain This is a new problem. The current episode started more than 1 week ago (seen 9/1 at [facility] and given abx, no improvement , took all of meds, still draining.). The problem has not changed since onset.   Past Medical History  Diagnosis Date  . Sciatica    History reviewed. No pertinent past surgical history. Family History  Problem Relation Age of Onset  . Family history unknown: Yes   Social History  Substance Use Topics  . Smoking status: Current Every Day Smoker -- 1.00 packs/day    Types: Cigarettes  . Smokeless tobacco: Never Used  . Alcohol Use: Yes     Comment: occasional   OB History    No data available     Review of Systems  Constitutional: Negative.   Musculoskeletal: Positive for myalgias and joint swelling.  Skin: Positive for wound.  All other systems reviewed and are negative.   Allergies  Review of patient's allergies indicates no known allergies.  Home Medications   Prior to Admission medications   Medication Sig Start Date End Date Taking? Authorizing Provider  cephALEXin (KEFLEX) 500 MG capsule Take 1 capsule (500 mg total) by mouth 4 (four) times daily. Take all of medicine and drink lots of fluids 06/19/15   Billy Fischer, MD  cyclobenzaprine (FLEXERIL) 10 MG tablet Take 1 tablet (10 mg total) by mouth 2 (two) times daily as needed for muscle spasms. 09/09/14   Hannah Muthersbaugh, PA-C  HYDROcodone-acetaminophen (NORCO) 5-325 MG per tablet Take 1-2 tablets by mouth every 6 (six) hours as needed for severe pain. 09/30/14   Mercedes Camprubi-Soms, PA-C  ibuprofen (ADVIL,MOTRIN) 800 MG tablet Take 1 tablet (800 mg total) by mouth 3  (three) times daily. 09/09/14   Hannah Muthersbaugh, PA-C  naproxen (NAPROSYN) 500 MG tablet Take 1 tablet (500 mg total) by mouth 2 (two) times daily as needed for mild pain, moderate pain or headache (TAKE WITH MEALS.). 09/30/14   Mercedes Camprubi-Soms, PA-C  oxyCODONE-acetaminophen (PERCOCET) 5-325 MG per tablet Take 1 tablet by mouth every 4 (four) hours as needed for moderate pain. 09/09/14   Hannah Muthersbaugh, PA-C  terbinafine (LAMISIL) 250 MG tablet Take 1 tablet (250 mg total) by mouth daily. 10/10/14   Billy Fischer, MD  tiZANidine (ZANAFLEX) 2 MG tablet Take by mouth every 8 (eight) hours as needed for muscle spasms.    Historical Provider, MD  traMADol (ULTRAM) 50 MG tablet Take 1 tablet (50 mg total) by mouth every 6 (six) hours as needed. 09/23/14   Alvina Chou, PA-C   Meds Ordered and Administered this Visit  Medications - No data to display  BP 144/82 mmHg  Pulse 94  Temp(Src) 98.2 F (36.8 C) (Oral)  Resp 17  SpO2 98%  LMP 05/07/2015 (Within Days) No data found.   Physical Exam  Constitutional: She is oriented to person, place, and time. She appears well-developed and well-nourished. No distress.  Musculoskeletal: She exhibits tenderness.  Tender erythema and subungual drainage from left 2nd toe  Neurological: She is alert and oriented to person, place, and time.  Skin: Skin is warm and dry.  Nursing note and vitals reviewed.   ED  Course  Procedures (including critical care time)  Labs Review Labs Reviewed - No data to display  Imaging Review No results found.   Visual Acuity Review  Right Eye Distance:   Left Eye Distance:   Bilateral Distance:    Right Eye Near:   Left Eye Near:    Bilateral Near:         MDM   1. Ingrowing toenail with infection        Billy Fischer, MD 06/20/15 2005

## 2015-06-19 NOTE — Discharge Instructions (Signed)
Soak toe daily, take all of medicine, see orthopedist as soon as possible for recheck

## 2015-10-14 ENCOUNTER — Emergency Department (HOSPITAL_COMMUNITY): Payer: Self-pay

## 2015-10-14 ENCOUNTER — Emergency Department (HOSPITAL_COMMUNITY)
Admission: EM | Admit: 2015-10-14 | Discharge: 2015-10-14 | Disposition: A | Discharge status: Home / Self Care | Payer: Self-pay | Attending: Emergency Medicine | Admitting: Emergency Medicine

## 2015-10-14 ENCOUNTER — Encounter (HOSPITAL_COMMUNITY): Payer: Self-pay

## 2015-10-14 DIAGNOSIS — Z79899 Other long term (current) drug therapy: Secondary | ICD-10-CM | POA: Insufficient documentation

## 2015-10-14 DIAGNOSIS — M25561 Pain in right knee: Secondary | ICD-10-CM | POA: Insufficient documentation

## 2015-10-14 DIAGNOSIS — F17210 Nicotine dependence, cigarettes, uncomplicated: Secondary | ICD-10-CM | POA: Insufficient documentation

## 2015-10-14 DIAGNOSIS — M25551 Pain in right hip: Secondary | ICD-10-CM | POA: Insufficient documentation

## 2015-10-14 MED ORDER — DIAZEPAM 2 MG PO TABS: 2.0000 mg | ORAL_TABLET | Freq: Three times a day (TID) | ORAL | Status: DC

## 2015-10-14 MED ORDER — KETOROLAC TROMETHAMINE 30 MG/ML IJ SOLN
30.0000 mg | Freq: Once | INTRAMUSCULAR | Status: AC
  Administered 2015-10-14: 30 mg via INTRAMUSCULAR
  Filled 2015-10-14: qty 1

## 2015-10-14 MED ORDER — TRAMADOL HCL 50 MG PO TABS: 50.0000 mg | ORAL_TABLET | Freq: Four times a day (QID) | ORAL | Status: DC | PRN

## 2015-10-14 NOTE — Discharge Instructions (Signed)
As discussed, today's evaluation is been largely reassuring.  Your pain is likely coming from inflammation of the nerve that controls sensation from your leg.  Please take all medication as directed, be sure to follow-up with our orthopedists.

## 2015-10-14 NOTE — ED Provider Notes (Signed)
CSN: [id number]     Arrival date & time 10/14/15  1221 History   First MD Initiated Contact with Patient 10/14/15 1243     Chief Complaint  Patient presents with  . Back Pain  . Abdominal Pain    HPI  Patient presents with concern of right knee, thigh, hip pain. Symptoms began initially with pain in the distal thigh, but over the past month have progressed to come [place] the entire area. The pain is most severe in the right lateral hip, worse with any attempt at ambulation, bending. No relief with Aleve, ibuprofen. No distal loss of sensation or weakness, no other complaints. Patient notes a history of sciatica on the contralateral side. She has not seen a physician or an orthopedist since the onset of this illness.   Past Medical History  Diagnosis Date  . Sciatica    Past Surgical History  Procedure Laterality Date  . Tubal ligation     Family History  Problem Relation Age of Onset  . Family history unknown: Yes   Social History  Substance Use Topics  . Smoking status: Current Every Day Smoker -- 1.00 packs/day    Types: Cigarettes  . Smokeless tobacco: Never Used  . Alcohol Use: Yes     Comment: occasional   OB History    No data available     Review of Systems  Constitutional:       Per HPI, otherwise negative  HENT:       Per HPI, otherwise negative  Respiratory:       Per HPI, otherwise negative  Cardiovascular:       Per HPI, otherwise negative  Gastrointestinal: Negative for vomiting.  Endocrine:       Negative aside from HPI  Genitourinary:       Neg aside from HPI   Musculoskeletal:       Per HPI, otherwise negative  Skin: Negative for color change.  Neurological: Negative for syncope and weakness.      Allergies  Review of patient's allergies indicates no known allergies.  Home Medications   Prior to Admission medications   Medication Sig Start Date End Date Taking? Authorizing Provider  naproxen sodium (ANAPROX) 220 MG tablet Take 880 mg  by mouth 2 (two) times daily with a meal.   Yes Historical Provider, MD  cephALEXin (KEFLEX) 500 MG capsule Take 1 capsule (500 mg total) by mouth 4 (four) times daily. Take all of medicine and drink lots of fluids Patient not taking: Reported on 10/14/2015 06/19/15   Billy Fischer, MD  cyclobenzaprine (FLEXERIL) 10 MG tablet Take 1 tablet (10 mg total) by mouth 2 (two) times daily as needed for muscle spasms. Patient not taking: Reported on 10/14/2015 09/09/14   Jarrett Soho Muthersbaugh, PA-C  HYDROcodone-acetaminophen (NORCO) 5-325 MG per tablet Take 1-2 tablets by mouth every 6 (six) hours as needed for severe pain. Patient not taking: Reported on 10/14/2015 09/30/14   Mercedes Camprubi-Soms, PA-C  ibuprofen (ADVIL,MOTRIN) 800 MG tablet Take 1 tablet (800 mg total) by mouth 3 (three) times daily. Patient not taking: Reported on 10/14/2015 09/09/14   Jarrett Soho Muthersbaugh, PA-C  naproxen (NAPROSYN) 500 MG tablet Take 1 tablet (500 mg total) by mouth 2 (two) times daily as needed for mild pain, moderate pain or headache (TAKE WITH MEALS.). Patient not taking: Reported on 10/14/2015 09/30/14   Mercedes Camprubi-Soms, PA-C  oxyCODONE-acetaminophen (PERCOCET) 5-325 MG per tablet Take 1 tablet by mouth every 4 (four) hours as needed for  moderate pain. Patient not taking: Reported on 10/14/2015 09/09/14   Jarrett Soho Muthersbaugh, PA-C  terbinafine (LAMISIL) 250 MG tablet Take 1 tablet (250 mg total) by mouth daily. Patient not taking: Reported on 10/14/2015 10/10/14   Billy Fischer, MD  tiZANidine (ZANAFLEX) 2 MG tablet Take 2 mg by mouth every 8 (eight) hours as needed for muscle spasms. Reported on 10/14/2015    Historical Provider, MD  traMADol (ULTRAM) 50 MG tablet Take 1 tablet (50 mg total) by mouth every 6 (six) hours as needed. Patient not taking: Reported on 10/14/2015 09/23/14   Alvina Chou, PA-C   BP 117/67 mmHg  Pulse 59  Temp(Src) 98.2 F (36.8 C) (Oral)  Resp 16  Ht 5\' 7"  (1.702 m)  SpO2 100%  LMP  10/08/2015 Physical Exam  Constitutional: She is oriented to person, place, and time. She appears well-developed and well-nourished. No distress.  HENT:  Head: Normocephalic and atraumatic.  Eyes: Conjunctivae and EOM are normal.  Cardiovascular: Normal rate and regular rhythm.   Pulmonary/Chest: Effort normal and breath sounds normal. No stridor. No respiratory distress.  Abdominal: She exhibits no distension.  Musculoskeletal: She exhibits no edema.       Right knee: Normal.       Right ankle: Normal.       Legs: She flexes and extends the hip spontaneously, w 5/5 strength.  Neurological: She is alert and oriented to person, place, and time. No cranial nerve deficit.  Skin: Skin is warm and dry.  Psychiatric: She has a normal mood and affect.  Nursing note and vitals reviewed.   ED Course  Procedures (including critical care time) Labs Review Labs Reviewed - No data to display  Imaging Review Dg Hip Unilat With Pelvis 2-3 Views Right  10/14/2015  CLINICAL DATA:  Hip pain. EXAM: DG HIP (WITH OR WITHOUT PELVIS) 2-3V RIGHT COMPARISON:  02/05/2015. FINDINGS: Degenerative changes lumbar spine and both hips. No acute bony or joint abnormality identified. Pelvic calcifications noted consistent with phleboliths. IMPRESSION: No acute abnormality. Electronically Signed   By: Marcello Moores  Register   On: 10/14/2015 14:24   I have personally reviewed and evaluated these images and lab results as part of my medical decision-making.  I reviewed exam the patient is in no distress. I discussed all findings with her. Patient is none on a course of analgesia, to follow-up with orthopedics.  MDM  Patient presents with concern of ongoing right hip, leg pain. Symptoms likely radiculopathy, with no evidence for fracture, substantial degenerative changes. No distal neurovascular compromise per Patient started on a course of analgesia, will follow up with orthopedics.   Carmin Muskrat, MD 10/14/15  [phone]

## 2015-10-14 NOTE — ED Notes (Signed)
Pt reports 1 month of pain on right lateral knee, radiating up leg into lower right back and right side of abd.

## 2015-10-16 ENCOUNTER — Emergency Department (HOSPITAL_COMMUNITY)
Admission: EM | Admit: 2015-10-16 | Discharge: 2015-10-16 | Disposition: A | Discharge status: Home / Self Care | Payer: Self-pay | Attending: Emergency Medicine | Admitting: Emergency Medicine

## 2015-10-16 ENCOUNTER — Emergency Department (HOSPITAL_COMMUNITY): Payer: Self-pay

## 2015-10-16 ENCOUNTER — Encounter (HOSPITAL_COMMUNITY): Payer: Self-pay

## 2015-10-16 DIAGNOSIS — F17210 Nicotine dependence, cigarettes, uncomplicated: Secondary | ICD-10-CM | POA: Insufficient documentation

## 2015-10-16 DIAGNOSIS — M5431 Sciatica, right side: Secondary | ICD-10-CM | POA: Insufficient documentation

## 2015-10-16 DIAGNOSIS — Z791 Long term (current) use of non-steroidal anti-inflammatories (NSAID): Secondary | ICD-10-CM | POA: Insufficient documentation

## 2015-10-16 DIAGNOSIS — Z79899 Other long term (current) drug therapy: Secondary | ICD-10-CM | POA: Insufficient documentation

## 2015-10-16 DIAGNOSIS — M5136 Other intervertebral disc degeneration, lumbar region: Secondary | ICD-10-CM | POA: Insufficient documentation

## 2015-10-16 MED ORDER — PREDNISONE 50 MG PO TABS: 50.0000 mg | ORAL_TABLET | Freq: Every day | ORAL | Status: DC

## 2015-10-16 MED ORDER — HYDROMORPHONE HCL 1 MG/ML IJ SOLN
1.0000 mg | Freq: Once | INTRAMUSCULAR | Status: AC
  Administered 2015-10-16: 1 mg via INTRAMUSCULAR
  Filled 2015-10-16: qty 1

## 2015-10-16 MED ORDER — KETOROLAC TROMETHAMINE 30 MG/ML IJ SOLN
60.0000 mg | Freq: Once | INTRAMUSCULAR | Status: AC
  Administered 2015-10-16: 60 mg via INTRAMUSCULAR
  Filled 2015-10-16: qty 2

## 2015-10-16 MED ORDER — HYDROCODONE-ACETAMINOPHEN 5-325 MG PO TABS: 1.0000 | ORAL_TABLET | Freq: Four times a day (QID) | ORAL | Status: DC | PRN

## 2015-10-16 NOTE — ED Notes (Addendum)
As this Agricultural consultant was attempting to discharge the Pt, Pt was very agitated and demanding to have her R knee looked at.  This Agricultural consultant calmly explained that the Provider had put her up for discharge and we would complete no further testing.  Pt, then, demanded to speak w/ someone "higher" than me.  Tammy AC spoke w/ Pt and explained, again, that the Pt was up for d/c, went over her prescriptions, and attempted to educate Pt regarding sciatica.  Pt began speaking very loudly and demanding that someone look at her R knee and that we/no one could tell her what she would have or would not have done.  Also, stated that she knows all about sciatica because she has "been dealing w/ it for over a year."  [facility], then, stated that she would address her concerns w/ the provider.  Chris PA walked up at that moment and explained the same thing to the Pt that [facility] and this Charge attempted to explain.  However, he added more detail regarding sciatica pain.  Pt thanked [place] PA for his explanation.  However, she followed her appreciative statement w/ calling Tammy AC and this Charge RN "dumb bi*ches" and "incompetent *sses."  This Agricultural consultant, then, asked Security to escort her out.  Pt noted to ambulate out of the department w/o difficulty.

## 2015-10-16 NOTE — ED Notes (Signed)
Family at bedside. 

## 2015-10-16 NOTE — ED Notes (Signed)
Pt is from home.  Was seen at [facility] x 2 days ago and her RX meds (taken yesterday) states they didn't work and didn't take any today so she called EMS to bring her here.  States the pain is mainly in her RT knee more than it has been.

## 2015-10-16 NOTE — ED Notes (Signed)
Assisted patient to restroom with steady. Pt moaned and stated she was unable to walk. Required maximum assistance.

## 2015-10-16 NOTE — ED Notes (Signed)
Patient transported to X-ray 

## 2015-10-16 NOTE — ED Notes (Signed)
Charge RN and [facility] at bedside as pt has requested to speak to the person in charge.  She had become loud and aggressive saying that "you don't have the right to tell me what I can and can't have done."  EDP has also come to speak to patient and explained her diagnosis and plan of care.  Pt continued to escalate and got out of the bed after demanding that the bed rail be lowered and walked out without assistance, slowly, but steadily.

## 2015-10-16 NOTE — ED Provider Notes (Signed)
CSN: [id number]     Arrival date & time 10/16/15  [id number] History   First MD Initiated Contact with Patient 10/16/15 [phone]     Chief Complaint  Patient presents with  . Leg Pain     (Consider location/radiation/quality/duration/timing/severity/associated sxs/prior Treatment) HPI Patient presents to the emergency department with lower back pain on the right that radiates down her right knee.  The patient states that she has pain with ambulation and movement.  The patient states nothing seems make the condition better.  She states she was seen here 2 days ago without significant relief of her symptoms.  Patient states that she has no chest pain, shortness of breath, weakness, dizziness, headache, blurred vision, numbness or weakness, fever, dysuria, incontinence, bloody stool, hematemesis, edema, or syncope.  The patient states that she has had problems with her back in the past, specifically left sided sciatica Past Medical History  Diagnosis Date  . Sciatica    Past Surgical History  Procedure Laterality Date  . Tubal ligation     Family History  Problem Relation Age of Onset  . Family history unknown: Yes   Social History  Substance Use Topics  . Smoking status: Current Every Day Smoker -- 1.00 packs/day    Types: Cigarettes  . Smokeless tobacco: Never Used  . Alcohol Use: Yes     Comment: occasional   OB History    No data available     Review of Systems All other systems negative except as documented in the HPI. All pertinent positives and negatives as reviewed in the HPI.   Allergies  Review of patient's allergies indicates no known allergies.  Home Medications   Prior to Admission medications   Medication Sig Start Date End Date Taking? Authorizing Provider  cephALEXin (KEFLEX) 500 MG capsule Take 1 capsule (500 mg total) by mouth 4 (four) times daily. Take all of medicine and drink lots of fluids Patient not taking: Reported on 10/14/2015 06/19/15   Billy Fischer, MD   cyclobenzaprine (FLEXERIL) 10 MG tablet Take 1 tablet (10 mg total) by mouth 2 (two) times daily as needed for muscle spasms. Patient not taking: Reported on 10/14/2015 09/09/14   Jarrett Soho Muthersbaugh, PA-C  diazepam (VALIUM) 2 MG tablet Take 1 tablet (2 mg total) by mouth 3 (three) times daily. 10/14/15   Carmin Muskrat, MD  HYDROcodone-acetaminophen (NORCO) 5-325 MG per tablet Take 1-2 tablets by mouth every 6 (six) hours as needed for severe pain. Patient not taking: Reported on 10/14/2015 09/30/14   Mercedes Camprubi-Soms, PA-C  ibuprofen (ADVIL,MOTRIN) 800 MG tablet Take 1 tablet (800 mg total) by mouth 3 (three) times daily. Patient not taking: Reported on 10/14/2015 09/09/14   Jarrett Soho Muthersbaugh, PA-C  naproxen (NAPROSYN) 500 MG tablet Take 1 tablet (500 mg total) by mouth 2 (two) times daily as needed for mild pain, moderate pain or headache (TAKE WITH MEALS.). Patient not taking: Reported on 10/14/2015 09/30/14   Mercedes Camprubi-Soms, PA-C  naproxen sodium (ANAPROX) 220 MG tablet Take 880 mg by mouth 2 (two) times daily with a meal.    Historical Provider, MD  oxyCODONE-acetaminophen (PERCOCET) 5-325 MG per tablet Take 1 tablet by mouth every 4 (four) hours as needed for moderate pain. Patient not taking: Reported on 10/14/2015 09/09/14   Jarrett Soho Muthersbaugh, PA-C  terbinafine (LAMISIL) 250 MG tablet Take 1 tablet (250 mg total) by mouth daily. Patient not taking: Reported on 10/14/2015 10/10/14   Billy Fischer, MD  tiZANidine (ZANAFLEX) 2 MG tablet  Take 2 mg by mouth every 8 (eight) hours as needed for muscle spasms. Reported on 10/14/2015    Historical Provider, MD  traMADol (ULTRAM) 50 MG tablet Take 1 tablet (50 mg total) by mouth every 6 (six) hours as needed. 10/14/15   Carmin Muskrat, MD   BP 132/88 mmHg  Pulse 65  Temp(Src) 98.1 F (36.7 C) (Oral)  Resp 19  SpO2 99%  LMP 10/08/2015 Physical Exam  Constitutional: She is oriented to person, place, and time. She appears well-developed  and well-nourished. No distress.  HENT:  Head: Normocephalic and atraumatic.  Mouth/Throat: Oropharynx is clear and moist.  Eyes: Pupils are equal, round, and reactive to light.  Neck: Normal range of motion. Neck supple.  Cardiovascular: Normal rate, regular rhythm and normal heart sounds.  Exam reveals no gallop and no friction rub.   No murmur heard. Pulmonary/Chest: Effort normal and breath sounds normal. No respiratory distress. She has no wheezes.  Neurological: She is alert and oriented to person, place, and time. She has normal strength and normal reflexes. No sensory deficit. She exhibits normal muscle tone. Coordination and gait normal.  Skin: Skin is warm and dry. No rash noted. No erythema.  Psychiatric: She has a normal mood and affect. Her behavior is normal.  Nursing note and vitals reviewed.   ED Course  Procedures (including critical care time) Labs Review Labs Reviewed - No data to display  Imaging Review Dg Lumbar Spine Complete  10/16/2015  CLINICAL DATA:  Moderate to severe right hip and right knee pain worsening over the last 2 days. No known injury. EXAM: LUMBAR SPINE - COMPLETE 4+ VIEW COMPARISON:  Radiographs 02/05/2015. FINDINGS: There are 5 lumbar type vertebral bodies. The alignment is normal. There is mildly progressive disc space loss and endplate degeneration at L4-5. No evidence of endplate destruction, fracture or pars defect. Left pelvic calcifications are grossly stable. IMPRESSION: Mildly progressive disc space loss at L4-5. No acute osseous findings or malalignment. Electronically Signed   By: Richardean Sale M.D.   On: 10/16/2015 12:48   Dg Hip Unilat With Pelvis 2-3 Views Right  10/14/2015  CLINICAL DATA:  Hip pain. EXAM: DG HIP (WITH OR WITHOUT PELVIS) 2-3V RIGHT COMPARISON:  02/05/2015. FINDINGS: Degenerative changes lumbar spine and both hips. No acute bony or joint abnormality identified. Pelvic calcifications noted consistent with phleboliths.  IMPRESSION: No acute abnormality. Electronically Signed   By: Marcello Moores  Register   On: 10/14/2015 14:24   I have personally reviewed and evaluated these images and lab results as part of my medical decision-making.   EKG Interpretation None      Patient be treated for sciatica have referred to neurosurgery.  Told to return here as needed.  She was able to ambulate without difficulty around the department.  Patient agrees the plan and all questions were answered    Dalia Heading, PA-C 10/16/15 1344  Virgel Manifold, MD 10/17/15 [phone]

## 2015-10-16 NOTE — Discharge Instructions (Signed)
Return here as needed.  Follow-up with the, Dr. provided.  Use ice and heat on your lower back

## 2015-10-16 NOTE — ED Notes (Signed)
Bed: [facility] Expected date:  Expected time:  Means of arrival:  Comments: 

## 2015-10-16 NOTE — ED Notes (Signed)
Called to speak with patient regarding patient refusal to leave at time of discharge; spoke with patient, reviewed prescriptions and reviewed pt diagnosis.  Patient continues to yell at [facility] nurse and charge nurse stating she wanted xrays done of her knee.  Lawyer PA over to patient to discuss patient visit, complaints and discharge instructions.  Patient continues to be beligerant cursing at staff.  States Chief Executive Officer PA is only caregiver that knew what he was doing.  Patient escorted from facility by security, ambulating without difficulty.

## 2015-10-17 ENCOUNTER — Emergency Department (HOSPITAL_COMMUNITY)
Admission: EM | Admit: 2015-10-17 | Discharge: 2015-10-17 | Disposition: A | Discharge status: Home / Self Care | Payer: Self-pay | Attending: Emergency Medicine | Admitting: Emergency Medicine

## 2015-10-17 ENCOUNTER — Encounter (HOSPITAL_COMMUNITY): Payer: Self-pay

## 2015-10-17 DIAGNOSIS — M25561 Pain in right knee: Secondary | ICD-10-CM | POA: Insufficient documentation

## 2015-10-17 DIAGNOSIS — Z7952 Long term (current) use of systemic steroids: Secondary | ICD-10-CM | POA: Insufficient documentation

## 2015-10-17 DIAGNOSIS — Z791 Long term (current) use of non-steroidal anti-inflammatories (NSAID): Secondary | ICD-10-CM | POA: Insufficient documentation

## 2015-10-17 DIAGNOSIS — M5430 Sciatica, unspecified side: Secondary | ICD-10-CM | POA: Insufficient documentation

## 2015-10-17 DIAGNOSIS — Z79899 Other long term (current) drug therapy: Secondary | ICD-10-CM | POA: Insufficient documentation

## 2015-10-17 DIAGNOSIS — F17210 Nicotine dependence, cigarettes, uncomplicated: Secondary | ICD-10-CM | POA: Insufficient documentation

## 2015-10-17 MED ORDER — KETOROLAC TROMETHAMINE 30 MG/ML IJ SOLN
30.0000 mg | Freq: Once | INTRAMUSCULAR | Status: AC
  Administered 2015-10-17: 30 mg via INTRAMUSCULAR
  Filled 2015-10-17: qty 1

## 2015-10-17 MED ORDER — METHOCARBAMOL 500 MG PO TABS: 500.0000 mg | ORAL_TABLET | Freq: Two times a day (BID) | ORAL | Status: DC

## 2015-10-17 MED ORDER — DIAZEPAM 5 MG PO TABS
5.0000 mg | ORAL_TABLET | Freq: Once | ORAL | Status: AC
  Administered 2015-10-17: 5 mg via ORAL
  Filled 2015-10-17: qty 1

## 2015-10-17 NOTE — ED Provider Notes (Signed)
CSN: [id number]     Arrival date & time 10/17/15  1355 History  By signing my name below, I, Rayna Sexton, attest that this documentation has been prepared under the direction and in the presence of Mizuki Hoel Y [place] Rady, [place]. Electronically Signed: Rayna Sexton, ED Scribe. 10/17/2015. 2:19 PM.   Chief Complaint  Patient presents with  . Knee Pain   The history is provided by the patient. No language interpreter was used.    HPI Comments: Emma Medina is a 45 y.o. female with a hx of sciatica who presents to the Emergency Department by EMS complaining of constant, moderate, radiating, right knee pain with onset 3 days ago. Pt was seen on 1/10 and 1/12 for similar symptoms and was given percocet, tramadol and prednisone. She notes her pain radiates up her right thigh with associated paraesthesia and mild swelling but denies any back pain. She states it feels like her knee is "constantly having spasms." She notes worsening pain with movement or when bearing weight. She denies being ambulatory initially due to her pain but confirms she can currently ambulate. Denies injury or trauma. She denies fevers, chills or any other associates symptoms at this time. She states she has been taking her prednisone as prescribed but it "doesn't work." In the room pt is writhing in pain and moaning during our conversation. However as I examined her knee with distraction while she was speaking she exhibited no tenderness, no laxity, and had FROM.   Past Medical History  Diagnosis Date  . Sciatica    Past Surgical History  Procedure Laterality Date  . Tubal ligation     Family History  Problem Relation Age of Onset  . Family history unknown: Yes   Social History  Substance Use Topics  . Smoking status: Current Every Day Smoker -- 1.00 packs/day    Types: Cigarettes  . Smokeless tobacco: Never Used  . Alcohol Use: Yes     Comment: occasional   OB History    No data available     Review of Systems   All other systems reviewed and are negative.    Allergies  Review of patient's allergies indicates no known allergies.  Home Medications   Prior to Admission medications   Medication Sig Start Date End Date Taking? Authorizing Provider  cephALEXin (KEFLEX) 500 MG capsule Take 1 capsule (500 mg total) by mouth 4 (four) times daily. Take all of medicine and drink lots of fluids Patient not taking: Reported on 10/14/2015 06/19/15   Billy Fischer, MD  cyclobenzaprine (FLEXERIL) 10 MG tablet Take 1 tablet (10 mg total) by mouth 2 (two) times daily as needed for muscle spasms. Patient not taking: Reported on 10/14/2015 09/09/14   Jarrett Soho Muthersbaugh, PA-C  diazepam (VALIUM) 2 MG tablet Take 1 tablet (2 mg total) by mouth 3 (three) times daily. 10/14/15   Carmin Muskrat, MD  HYDROcodone-acetaminophen (NORCO/VICODIN) 5-325 MG tablet Take 1 tablet by mouth every 6 (six) hours as needed for moderate pain. 10/16/15   Dalia Heading, PA-C  ibuprofen (ADVIL,MOTRIN) 800 MG tablet Take 1 tablet (800 mg total) by mouth 3 (three) times daily. Patient not taking: Reported on 10/14/2015 09/09/14   Jarrett Soho Muthersbaugh, PA-C  naproxen (NAPROSYN) 500 MG tablet Take 1 tablet (500 mg total) by mouth 2 (two) times daily as needed for mild pain, moderate pain or headache (TAKE WITH MEALS.). Patient not taking: Reported on 10/14/2015 09/30/14   Mercedes Camprubi-Soms, PA-C  naproxen sodium (ANAPROX) 220 MG tablet  Take 880 mg by mouth 2 (two) times daily with a meal.    Historical Provider, MD  oxyCODONE-acetaminophen (PERCOCET) 5-325 MG per tablet Take 1 tablet by mouth every 4 (four) hours as needed for moderate pain. Patient not taking: Reported on 10/14/2015 09/09/14   Jarrett Soho Muthersbaugh, PA-C  predniSONE (DELTASONE) 50 MG tablet Take 1 tablet (50 mg total) by mouth daily. 10/16/15   Christopher Lawyer, PA-C  terbinafine (LAMISIL) 250 MG tablet Take 1 tablet (250 mg total) by mouth daily. Patient not taking: Reported  on 10/14/2015 10/10/14   Billy Fischer, MD  tiZANidine (ZANAFLEX) 2 MG tablet Take 2 mg by mouth every 8 (eight) hours as needed for muscle spasms. Reported on 10/14/2015    Historical Provider, MD  traMADol (ULTRAM) 50 MG tablet Take 1 tablet (50 mg total) by mouth every 6 (six) hours as needed. 10/14/15   Carmin Muskrat, MD   LMP 10/08/2015    Physical Exam  Constitutional: She is oriented to person, place, and time. She appears well-developed and well-nourished.  HENT:  Head: Normocephalic and atraumatic.  Neck: Normal range of motion.  Cardiovascular: Normal rate.   Pulmonary/Chest: Effort normal. No respiratory distress.  Abdominal: Soft.  Musculoskeletal: Normal range of motion.  Right knee is completely nontender on exam with distraction. She has FROM. Negative [place]ray and Lachmann. Pt is able to ambulate with steady gait.   Neurological: She is alert and oriented to person, place, and time.  Skin: Skin is warm and dry. She is not diaphoretic.  Psychiatric: She has a normal mood and affect. Her behavior is normal.  Nursing note and vitals reviewed.   ED Course  Procedures  DIAGNOSTIC STUDIES: Oxygen Saturation is 100% on RA, normal by my interpretation.    COORDINATION OF CARE: 2:18 PM Pt presents today due to right knee pain. Discussed next steps with pt including a Toradol injection and a valium. Pt understood and agreed to the plan.   Labs Review Labs Reviewed - No data to display  Imaging Review Dg Lumbar Spine Complete  10/16/2015  CLINICAL DATA:  Moderate to severe right hip and right knee pain worsening over the last 2 days. No known injury. EXAM: LUMBAR SPINE - COMPLETE 4+ VIEW COMPARISON:  Radiographs 02/05/2015. FINDINGS: There are 5 lumbar type vertebral bodies. The alignment is normal. There is mildly progressive disc space loss and endplate degeneration at L4-5. No evidence of endplate destruction, fracture or pars defect. Left pelvic calcifications are grossly  stable. IMPRESSION: Mildly progressive disc space loss at L4-5. No acute osseous findings or malalignment. Electronically Signed   By: Richardean Sale M.D.   On: 10/16/2015 12:48   I have personally reviewed and evaluated these images and lab results as part of my medical decision-making.   EKG Interpretation None      MDM   Final diagnoses:  Right knee pain    Pt with right knee pain that has progressively gotten worse over the past two days. She has no tenderness on exam with distraction, though is moaning and groaning throughout our conversation. I suspect pt does have sciatica given her DDD and perhaps has a component of overuse or arthritis in her knee. Will hold off on XR today as she has no history of injury or trauma, is nontender on exam, and is able to bear weight. Pt was given valium and toradol in the ED. I had initially planned on prescribing short course of valium at home given pt's complaints of  muscle spasms but it appears she was given a prescription for same a few days ago. Will give rx for robaxin. Pt instructed to finish course of prednisone as it takes time to work. She may take the [place] she has at home as needed. ER return precautions given. Resource guide given to establish PCP for ongoing management of pain and sciatica.   I personally performed the services described in this documentation, which was scribed in my presence. The recorded information has been reviewed and is accurate.   Anne Ng, PA-C 10/17/15 1502  Julianne Rice, MD 10/17/15 [phone]

## 2015-10-17 NOTE — Discharge Instructions (Signed)
You were seen in the ER today for right knee pain. It is very unlikely that you have a blood clot. Your symptoms are likely due to overuse/inflammation and/or sciatica. I will give you a prescription for Robaxin, a muscle relaxant, to help with your muscle spasms. Finish the prednisone you were prescribed yesterday as well. You  May also take the Norco that you have at home as needed for pain.

## 2015-10-17 NOTE — ED Notes (Signed)
Pt. Presents with concern over R knee pain. Pt. With hx of sciatica to L leg. Pt. Reports tingling and numbness circumferentially to knee only. Pt. Seen yesterday for same.

## 2015-11-05 ENCOUNTER — Other Ambulatory Visit: Payer: Self-pay | Admitting: *Deleted

## 2015-11-05 DIAGNOSIS — Z1231 Encounter for screening mammogram for malignant neoplasm of breast: Secondary | ICD-10-CM

## 2015-11-17 ENCOUNTER — Ambulatory Visit
Admission: RE | Admit: 2015-11-17 | Discharge: 2015-11-17 | Disposition: A | Discharge status: Home / Self Care | Payer: No Typology Code available for payment source | Source: Ambulatory Visit | Attending: *Deleted | Admitting: *Deleted

## 2015-11-17 DIAGNOSIS — Z1231 Encounter for screening mammogram for malignant neoplasm of breast: Secondary | ICD-10-CM

## 2016-03-01 ENCOUNTER — Inpatient Hospital Stay (HOSPITAL_COMMUNITY)
Admission: AD | Admit: 2016-03-01 | Discharge: 2016-03-01 | Disposition: A | Discharge status: Home / Self Care | Payer: No Typology Code available for payment source | Source: Ambulatory Visit | Attending: Family Medicine | Admitting: Family Medicine

## 2016-03-01 DIAGNOSIS — Z3202 Encounter for pregnancy test, result negative: Secondary | ICD-10-CM | POA: Insufficient documentation

## 2016-03-01 DIAGNOSIS — N926 Irregular menstruation, unspecified: Secondary | ICD-10-CM | POA: Insufficient documentation

## 2016-03-01 DIAGNOSIS — A499 Bacterial infection, unspecified: Secondary | ICD-10-CM

## 2016-03-01 DIAGNOSIS — R350 Frequency of micturition: Secondary | ICD-10-CM

## 2016-03-01 DIAGNOSIS — F17210 Nicotine dependence, cigarettes, uncomplicated: Secondary | ICD-10-CM | POA: Insufficient documentation

## 2016-03-01 DIAGNOSIS — B9689 Other specified bacterial agents as the cause of diseases classified elsewhere: Secondary | ICD-10-CM

## 2016-03-01 DIAGNOSIS — N760 Acute vaginitis: Secondary | ICD-10-CM | POA: Insufficient documentation

## 2016-03-01 LAB — WET PREP, GENITAL
Sperm: NONE SEEN
Trich, Wet Prep: NONE SEEN
Yeast Wet Prep HPF POC: NONE SEEN

## 2016-03-01 LAB — URINALYSIS, ROUTINE W REFLEX MICROSCOPIC
BILIRUBIN URINE: NEGATIVE
GLUCOSE, UA: NEGATIVE mg/dL
KETONES UR: NEGATIVE mg/dL
LEUKOCYTES UA: NEGATIVE
Nitrite: NEGATIVE
PH: 6 (ref 5.0–8.0)
PROTEIN: NEGATIVE mg/dL
Specific Gravity, Urine: 1.025 (ref 1.005–1.030)

## 2016-03-01 LAB — URINE MICROSCOPIC-ADD ON: WBC UA: NONE SEEN WBC/hpf (ref 0–5)

## 2016-03-01 LAB — POCT PREGNANCY, URINE: PREG TEST UR: NEGATIVE

## 2016-03-01 MED ORDER — METRONIDAZOLE 500 MG PO TABS: 500.0000 mg | ORAL_TABLET | Freq: Two times a day (BID) | ORAL | Status: DC

## 2016-03-01 NOTE — MAU Note (Signed)
Pt reports she has a foul smelling discharge and it has been going on for about a month. Also reports she has not had a period since 04/13

## 2016-03-01 NOTE — MAU Provider Note (Signed)
History     CSN: [id number]  Arrival date and time: 03/01/16 2155   First Provider Initiated Contact with Patient 03/01/16 2220      Chief Complaint  Patient presents with  . Vaginal Discharge   HPI [name] is a 45 y.o. female who presents to MAU today with complaint of vaginal discharge and frequent urination for 2-3 days. She states discharge is clear. She has noted mild associated irritation. She denies dysuria, pelvic pain or vaginal bleeding today. She also states amenorrhea since LMP 01/15/16 without history of irregular periods.   OB History    No data available      Past Medical History  Diagnosis Date  . Sciatica     Past Surgical History  Procedure Laterality Date  . Tubal ligation      Family History  Problem Relation Age of Onset  . Family history unknown: Yes    Social History  Substance Use Topics  . Smoking status: Current Every Day Smoker -- 1.00 packs/day    Types: Cigarettes  . Smokeless tobacco: Never Used  . Alcohol Use: Yes     Comment: occasional    Allergies: No Known Allergies  No prescriptions prior to admission    Review of Systems  Constitutional: Negative for fever and malaise/fatigue.  Gastrointestinal: Positive for nausea. Negative for vomiting, abdominal pain, diarrhea and constipation.  Genitourinary: Positive for frequency. Negative for dysuria, urgency and flank pain.       + vaginal discharge Neg - vaginal bleeding   Physical Exam   Blood pressure 119/67, pulse 64, temperature 99 F (37.2 C), temperature source Oral, resp. rate 19, height 5\' 6"  (1.676 m), weight 259 lb (117.482 kg), last menstrual period 01/15/2016, SpO2 100 %.  Physical Exam  Nursing note and vitals reviewed. Constitutional: She is oriented to person, place, and time. She appears well-developed and well-nourished. No distress.  HENT:  Head: Normocephalic and atraumatic.  Cardiovascular: Normal rate.   Respiratory: Effort normal.  GI:  Soft. She exhibits no distension and no mass. There is no tenderness. There is no rebound and no guarding.  Genitourinary: Uterus is not enlarged and not tender. Cervix exhibits friability. Cervix exhibits no motion tenderness and no discharge. Right adnexum displays no mass and no tenderness. Left adnexum displays no mass and no tenderness. No bleeding in the vagina. Vaginal discharge (small amount of thin, white discharge noted) found.  Neurological: She is alert and oriented to person, place, and time.  Skin: Skin is warm and dry. No erythema.  Psychiatric: She has a normal mood and affect.    Results for orders placed or performed during the hospital encounter of 03/01/16 (from the past 24 hour(s))  Urinalysis, Routine w reflex microscopic (not at [facility])     Status: Abnormal   Collection Time: 03/01/16 10:03 PM  Result Value Ref Range   Color, Urine YELLOW YELLOW   APPearance CLEAR CLEAR   Specific Gravity, Urine 1.025 1.005 - 1.030   pH 6.0 5.0 - 8.0   Glucose, UA NEGATIVE NEGATIVE mg/dL   Hgb urine dipstick TRACE (A) NEGATIVE   Bilirubin Urine NEGATIVE NEGATIVE   Ketones, ur NEGATIVE NEGATIVE mg/dL   Protein, ur NEGATIVE NEGATIVE mg/dL   Nitrite NEGATIVE NEGATIVE   Leukocytes, UA NEGATIVE NEGATIVE  Urine microscopic-add on     Status: Abnormal   Collection Time: 03/01/16 10:03 PM  Result Value Ref Range   Squamous Epithelial / LPF 0-5 (A) NONE SEEN  WBC, UA NONE SEEN 0 - 5 WBC/hpf   RBC / HPF 0-5 0 - 5 RBC/hpf   Bacteria, UA RARE (A) NONE SEEN  Pregnancy, urine POC     Status: None   Collection Time: 03/01/16 10:20 PM  Result Value Ref Range   Preg Test, Ur NEGATIVE NEGATIVE  Wet prep, genital     Status: Abnormal   Collection Time: 03/01/16 10:30 PM  Result Value Ref Range   Yeast Wet Prep HPF POC NONE SEEN NONE SEEN   Trich, Wet Prep NONE SEEN NONE SEEN   Clue Cells Wet Prep HPF POC PRESENT (A) NONE SEEN   WBC, Wet Prep HPF POC MODERATE (A) NONE SEEN   Sperm NONE SEEN      MAU Course  Procedures None  MDM UPT - negative UA, wet prep, GC/Chlamydia today Patient declines RPR and HIV testing at this time  Assessment and Plan  A: Bacterial vaginosis Irregular menses, possible peri-menopausal bleeding profile Urinary frequency   P: Discharge home Rx for Flagyl given to patient  Warning signs for worsening condition discussed GC/Chlamydia pending Patient advised to follow-up for pap smear with free screening clinics. Advised of how to find schedule and locations on the [facility] information for [place] given to patient to establish PCP care due to urinary frequency without evidence of infection Patient may return to MAU as needed or if her condition were to change or worsen  Luvenia Redden, PA-C  03/02/2016, 12:04 AM

## 2016-03-01 NOTE — Discharge Instructions (Signed)
Bacterial Vaginosis Bacterial vaginosis is an infection of the vagina. It happens when too many germs (bacteria) grow in the vagina. Having this infection puts you at risk for getting other infections from sex. Treating this infection can help lower your risk for other infections, such as:   Chlamydia.  Gonorrhea.  HIV.  Herpes. HOME CARE  Take your medicine as told by your doctor.  Finish your medicine even if you start to feel better.  Tell your sex partner that you have an infection. They should see their doctor for treatment.  During treatment:  Avoid sex or use condoms correctly.  Do not douche.  Do not drink alcohol unless your doctor tells you it is ok.  Do not breastfeed unless your doctor tells you it is ok. GET HELP IF:  You are not getting better after 3 days of treatment.  You have more grey fluid (discharge) coming from your vagina than before.  You have more pain than before.  You have a fever. MAKE SURE YOU:   Understand these instructions.  Will watch your condition.  Will get help right away if you are not doing well or get worse.   This information is not intended to replace advice given to you by your health care provider. Make sure you discuss any questions you have with your health care provider.   Document Released: 06/29/2008 Document Revised: 10/11/2014 Document Reviewed: 05/02/2013 Elsevier Interactive Patient Education 2016 [employer]. Urinary Frequency The number of times a normal person urinates depends upon how much liquid they take in and how much liquid they are losing. If the temperature is hot and there is high humidity, then the person will sweat more and usually breathe a little more frequently. These factors decrease the amount of frequency of urination that would be considered normal. The amount you drink is easily determined, but the amount of fluid lost is sometimes more difficult to calculate.  Fluid is lost in two  ways:  Sensible fluid loss is usually measured by the amount of urine that you get rid of. Losses of fluid can also occur with diarrhea.  Insensible fluid loss is more difficult to measure. It is caused by evaporation. Insensible loss of fluid occurs through breathing and sweating. It usually ranges from a little less than a quart to a little more than a quart of fluid a day. In normal temperatures and activity levels, the average person may urinate 4 to 7 times in a 24-hour period. Needing to urinate more often than that could indicate a problem. If one urinates 4 to 7 times in 24 hours and has large volumes each time, that could indicate a different problem from one who urinates 4 to 7 times a day and has small volumes. The time of urinating is also important. Most urinating should be done during the waking hours. Getting up at night to urinate frequently can indicate some problems. CAUSES  The bladder is the organ in your lower abdomen that holds urine. Like a balloon, it swells some as it fills up. Your nerves sense this and tell you it is time to head for the bathroom. There are a number of reasons that you might feel the need to urinate more often than usual. They include:  Urinary tract infection. This is usually associated with other signs such as burning when you urinate.  In men, problems with the prostate (a walnut-size gland that is located near the tube that carries urine out of your body).  There are two reasons why the prostate can cause an increased frequency of urination:  An enlarged prostate that does not let the bladder empty well. If the bladder only half empties when you urinate, then it only has half the capacity to fill before you have to urinate again.  The nerves in the bladder become more hypersensitive with an increased size of the prostate even if the bladder empties completely.  Pregnancy.  Obesity. Excess weight is more likely to cause a problem for women than for  men.  Bladder stones or other bladder problems.  Caffeine.  Alcohol.  Medications. For example, drugs that help the body get rid of extra fluid (diuretics) increase urine production. Some other medicines must be taken with lots of fluids.  Muscle or nerve weakness. This might be the result of a spinal cord injury, a stroke, multiple sclerosis, or Parkinson disease.  Long-standing diabetes can decrease the sensation of the bladder. This loss of sensation makes it harder to sense the bladder needs to be emptied. Over a period of years, the bladder is stretched out by constant overfilling. This weakens the bladder muscles so that the bladder does not empty well and has less capacity to fill with new urine.  Interstitial cystitis (also called painful bladder syndrome). This condition develops because the tissues that line the inside of the bladder are inflamed (inflammation is the body's way of reacting to injury or infection). It causes pain and frequent urination. It occurs in women more often than in men. DIAGNOSIS   To decide what might be causing your urinary frequency, your health care provider will probably:  Ask about symptoms you have noticed.  Ask about your overall health. This will include questions about any medications you are taking.  Do a physical examination.  Order some tests. These might include:  A blood test to check for diabetes or other health issues that could be contributing to the problem.  Urine testing. This could measure the flow of urine and the pressure on the bladder.  A test of your neurological system (the brain, spinal cord, and nerves). This is the system that senses the need to urinate.  A bladder test to check whether it is emptying completely when you urinate.  Cystoscopy. This test uses a thin tube with a tiny camera on it. It offers a look inside your urethra and bladder to see if there are problems.  Imaging tests. You might be given a  contrast dye and then asked to urinate. X-rays are taken to see how your bladder is working. TREATMENT  It is important for you to be evaluated to determine if the amount or frequency that you have is unusual or abnormal. If it is found to be abnormal, the cause should be determined and this can usually be found out easily. Depending upon the cause, treatment could include medication, stimulation of the nerves, or surgery. There are not too many things that you can do as an individual to change your urinary frequency. It is important that you balance the amount of fluid intake needed to compensate for your activity and the temperature. Medical problems will be diagnosed and taken care of by your physician. There is no particular bladder training such as Kegel exercises that you can do to help urinary frequency. This is an exercise that is usually recommended for people who have leaking of urine when they laugh, cough, or sneeze. HOME CARE INSTRUCTIONS   Take any medications your health care provider  prescribed or suggested. Follow the directions carefully.  Practice any lifestyle changes that are recommended. These might include:  Drinking less fluid or drinking at different times of the day. If you need to urinate often during the night, for example, you may need to stop drinking fluids early in the evening.  Cutting down on caffeine or alcohol. They both can make you need to urinate more often than normal. Caffeine is found in coffee, tea, and sodas.  Losing weight, if that is recommended.  Keep a journal or a log. You might be asked to record how much you drink and when and where you feel the need to urinate. This will also help evaluate how well the treatment provided by your physician is working. SEEK MEDICAL CARE IF:   Your need to urinate often gets worse.  You feel increased pain or irritation when you urinate.  You notice blood in your urine.  You have questions about any  medications that your health care provider recommended.  You notice blood, pus, or swelling at the site of any test or treatment procedure.  You develop a fever of more than 100.526F (38.1C). SEEK IMMEDIATE MEDICAL CARE IF:  You develop a fever of more than 102.26F (38.9C).   This information is not intended to replace advice given to you by your health care provider. Make sure you discuss any questions you have with your health care provider.   Document Released: 07/17/2009 Document Revised: 10/11/2014 Document Reviewed: 07/17/2009 Elsevier Interactive Patient Education [employer].

## 2016-03-02 LAB — GC/CHLAMYDIA PROBE AMP (~~LOC~~) NOT AT ARMC
Chlamydia: NEGATIVE
Neisseria Gonorrhea: NEGATIVE

## 2016-11-10 ENCOUNTER — Encounter (HOSPITAL_COMMUNITY): Payer: Self-pay | Admitting: *Deleted

## 2016-11-10 ENCOUNTER — Inpatient Hospital Stay (HOSPITAL_COMMUNITY)
Admission: AD | Admit: 2016-11-10 | Discharge: 2016-11-10 | Disposition: A | Discharge status: Home / Self Care | Payer: No Typology Code available for payment source | Source: Ambulatory Visit | Attending: Obstetrics & Gynecology | Admitting: Obstetrics & Gynecology

## 2016-11-10 DIAGNOSIS — Z9851 Tubal ligation status: Secondary | ICD-10-CM | POA: Insufficient documentation

## 2016-11-10 DIAGNOSIS — F17210 Nicotine dependence, cigarettes, uncomplicated: Secondary | ICD-10-CM | POA: Insufficient documentation

## 2016-11-10 DIAGNOSIS — N898 Other specified noninflammatory disorders of vagina: Secondary | ICD-10-CM

## 2016-11-10 DIAGNOSIS — N939 Abnormal uterine and vaginal bleeding, unspecified: Secondary | ICD-10-CM | POA: Insufficient documentation

## 2016-11-10 DIAGNOSIS — N760 Acute vaginitis: Secondary | ICD-10-CM | POA: Insufficient documentation

## 2016-11-10 DIAGNOSIS — B9689 Other specified bacterial agents as the cause of diseases classified elsewhere: Secondary | ICD-10-CM

## 2016-11-10 LAB — URINALYSIS, ROUTINE W REFLEX MICROSCOPIC
BACTERIA UA: NONE SEEN
Bilirubin Urine: NEGATIVE
GLUCOSE, UA: NEGATIVE mg/dL
KETONES UR: NEGATIVE mg/dL
Leukocytes, UA: NEGATIVE
NITRITE: NEGATIVE
PROTEIN: NEGATIVE mg/dL
Specific Gravity, Urine: 1.019 (ref 1.005–1.030)
pH: 6 (ref 5.0–8.0)

## 2016-11-10 LAB — POCT PREGNANCY, URINE: Preg Test, Ur: NEGATIVE

## 2016-11-10 LAB — WET PREP, GENITAL
Sperm: NONE SEEN
Trich, Wet Prep: NONE SEEN
Yeast Wet Prep HPF POC: NONE SEEN

## 2016-11-10 MED ORDER — METRONIDAZOLE 500 MG PO TABS: 500.0000 mg | ORAL_TABLET | Freq: Two times a day (BID) | ORAL | 0 refills | Status: DC

## 2016-11-10 NOTE — MAU Note (Signed)
Urine in lab 

## 2016-11-10 NOTE — Discharge Instructions (Signed)

## 2016-11-10 NOTE — MAU Note (Signed)
Keeps spotting and just went off on Fri.  Thinks she has another bacterial infection.  She douched this weekend, still has an odor.

## 2016-11-10 NOTE — MAU Provider Note (Signed)
History     CSN: [id number]  Arrival date and time: 11/10/16 1416   First Provider Initiated Contact with Patient 11/10/16 1521      Chief Complaint  Patient presents with  . vag odor  . Vaginal Bleeding   HPI   [name] is a 46 y.o. female [phone] here with vaginal odor, and vaginal discharge. " I think I have a bad infection". She does not have a GYN.   OB History    Gravida Para Term Preterm AB Living   8 8 8     8    SAB TAB Ectopic Multiple Live Births           8      Past Medical History:  Diagnosis Date  . Sciatica     Past Surgical History:  Procedure Laterality Date  . TUBAL LIGATION      Family History  Problem Relation Age of Onset  . Family history unknown: Yes    Social History  Substance Use Topics  . Smoking status: Current Every Day Smoker    Packs/day: 1.00    Types: Cigarettes  . Smokeless tobacco: Former Systems developer  . Alcohol use Yes     Comment: occasional    Allergies: No Known Allergies  No prescriptions prior to admission.   Results for orders placed or performed during the hospital encounter of 11/10/16 (from the past 48 hour(s))  Urinalysis, Routine w reflex microscopic     Status: Abnormal   Collection Time: 11/10/16  2:34 PM  Result Value Ref Range   Color, Urine YELLOW YELLOW   APPearance CLEAR CLEAR   Specific Gravity, Urine 1.019 1.005 - 1.030   pH 6.0 5.0 - 8.0   Glucose, UA NEGATIVE NEGATIVE mg/dL   Hgb urine dipstick MODERATE (A) NEGATIVE   Bilirubin Urine NEGATIVE NEGATIVE   Ketones, ur NEGATIVE NEGATIVE mg/dL   Protein, ur NEGATIVE NEGATIVE mg/dL   Nitrite NEGATIVE NEGATIVE   Leukocytes, UA NEGATIVE NEGATIVE   RBC / HPF 0-5 0 - 5 RBC/hpf   WBC, UA 0-5 0 - 5 WBC/hpf   Bacteria, UA NONE SEEN NONE SEEN   Squamous Epithelial / LPF 0-5 (A) NONE SEEN   Mucous PRESENT   Pregnancy, urine POC     Status: None   Collection Time: 11/10/16  2:40 PM  Result Value Ref Range   Preg Test, Ur NEGATIVE NEGATIVE   Comment:        THE SENSITIVITY OF THIS METHODOLOGY IS >24 mIU/mL   Wet prep, genital     Status: Abnormal   Collection Time: 11/10/16  3:30 PM  Result Value Ref Range   Yeast Wet Prep HPF POC NONE SEEN NONE SEEN   Trich, Wet Prep NONE SEEN NONE SEEN   Clue Cells Wet Prep HPF POC PRESENT (A) NONE SEEN   WBC, Wet Prep HPF POC FEW (A) NONE SEEN    Comment: MODERATE BACTERIA SEEN   Sperm NONE SEEN     Review of Systems  Constitutional: Negative for fever.  Genitourinary: Positive for vaginal discharge. Negative for dysuria.   Physical Exam   Blood pressure 130/75, pulse 62, temperature 97.5 F (36.4 C), temperature source Oral, resp. rate 16, weight 259 lb 4 oz (117.6 kg), last menstrual period 10/28/2016.  Physical Exam  Constitutional: She is oriented to person, place, and time. She appears well-developed and well-nourished. No distress.  HENT:  Head: Normocephalic.  Eyes: Pupils are equal, round, and reactive to light.  Genitourinary:  Genitourinary Comments: Wet prep and GC collected by RN without speculum   Musculoskeletal: Normal range of motion.  Neurological: She is alert and oriented to person, place, and time.  Skin: Skin is warm. She is not diaphoretic.  Psychiatric: Her behavior is normal.    MAU Course  Procedures  None  MDM Wet prep & Gc   Assessment and Plan   A:  1. BV (bacterial vaginosis)   2. Vaginal odor     P:  Discharge home in stable condition Rx: Flagyl  Return to MAU for emergencies only  No alcohol while take flagyl  Lezlie Lye, NP 11/10/2016 4:07 PM

## 2016-11-11 LAB — GC/CHLAMYDIA PROBE AMP (~~LOC~~) NOT AT ARMC
CHLAMYDIA, DNA PROBE: NEGATIVE
NEISSERIA GONORRHEA: NEGATIVE

## 2017-01-28 ENCOUNTER — Other Ambulatory Visit: Payer: Self-pay | Admitting: *Deleted

## 2017-02-02 ENCOUNTER — Other Ambulatory Visit: Payer: Self-pay | Admitting: Obstetrics & Gynecology

## 2017-02-02 DIAGNOSIS — Z1231 Encounter for screening mammogram for malignant neoplasm of breast: Secondary | ICD-10-CM

## 2017-02-09 ENCOUNTER — Ambulatory Visit
Admission: RE | Admit: 2017-02-09 | Discharge: 2017-02-09 | Disposition: A | Discharge status: Home / Self Care | Payer: No Typology Code available for payment source | Source: Ambulatory Visit | Attending: Obstetrics & Gynecology | Admitting: Obstetrics & Gynecology

## 2017-02-09 DIAGNOSIS — Z1231 Encounter for screening mammogram for malignant neoplasm of breast: Secondary | ICD-10-CM

## 2017-02-10 ENCOUNTER — Other Ambulatory Visit: Payer: Self-pay | Admitting: Obstetrics & Gynecology

## 2017-02-10 DIAGNOSIS — R928 Other abnormal and inconclusive findings on diagnostic imaging of breast: Secondary | ICD-10-CM

## 2017-02-25 ENCOUNTER — Other Ambulatory Visit (HOSPITAL_COMMUNITY): Payer: Self-pay | Admitting: *Deleted

## 2017-02-25 DIAGNOSIS — R928 Other abnormal and inconclusive findings on diagnostic imaging of breast: Secondary | ICD-10-CM

## 2017-03-10 ENCOUNTER — Ambulatory Visit
Admission: RE | Admit: 2017-03-10 | Discharge: 2017-03-10 | Disposition: A | Discharge status: Home / Self Care | Payer: Self-pay | Source: Ambulatory Visit | Attending: Obstetrics and Gynecology | Admitting: Obstetrics and Gynecology

## 2017-03-10 ENCOUNTER — Ambulatory Visit (HOSPITAL_COMMUNITY)
Admission: RE | Admit: 2017-03-10 | Discharge: 2017-03-10 | Disposition: A | Discharge status: Home / Self Care | Payer: Self-pay | Source: Ambulatory Visit | Attending: Obstetrics and Gynecology | Admitting: Obstetrics and Gynecology

## 2017-03-10 ENCOUNTER — Ambulatory Visit
Admission: RE | Admit: 2017-03-10 | Discharge: 2017-03-10 | Disposition: A | Discharge status: Home / Self Care | Payer: No Typology Code available for payment source | Source: Ambulatory Visit | Attending: Obstetrics and Gynecology | Admitting: Obstetrics and Gynecology

## 2017-03-10 ENCOUNTER — Other Ambulatory Visit (HOSPITAL_COMMUNITY): Payer: Self-pay | Admitting: Obstetrics and Gynecology

## 2017-03-10 ENCOUNTER — Encounter (HOSPITAL_COMMUNITY): Payer: Self-pay

## 2017-03-10 VITALS — BP 114/76 | Ht 66.0 in | Wt 253.6 lb

## 2017-03-10 DIAGNOSIS — Z1239 Encounter for other screening for malignant neoplasm of breast: Secondary | ICD-10-CM

## 2017-03-10 DIAGNOSIS — R928 Other abnormal and inconclusive findings on diagnostic imaging of breast: Secondary | ICD-10-CM

## 2017-03-10 DIAGNOSIS — R599 Enlarged lymph nodes, unspecified: Secondary | ICD-10-CM

## 2017-03-10 NOTE — Patient Instructions (Signed)
Explained breast self awareness with Celene Skeen. Patient did not need a Pap smear today due to last Pap smear was in May 2018 per patient. Let her know [facility] will cover Pap smears every 3 years unless has a history of abnormal Pap smears. Referred patient to the [facility] for a right breast diagnostic mammogram and possible breast ultrasound per recommendation. Appointment scheduled for Thursday, March 10, 2017 at 1450. Emma Medina verbalized understanding.  Jennalynn Rivard, Arvil Chaco, RN 3:46 PM

## 2017-03-10 NOTE — Progress Notes (Signed)
Patient referred to [facility] by the [facility] due to recommending additional imaging of the right breast. Screening mammogram completed 02/09/2017.  Pap Smear: Pap smear not completed today. Last Pap smear was in May 2018 at the [facility] and normal per patient. Per patient has no history of an abnormal Pap smear.  Physical exam: Breasts Breasts symmetrical. No skin abnormalities bilateral breasts. No nipple retraction bilateral breasts. No nipple discharge bilateral breasts. No lymphadenopathy. No lumps palpated bilateral breasts. No complaints of pain or tenderness on exam. Referred patient to the [facility] for a right breast diagnostic mammogram and possible breast ultrasound per recommendation. Appointment scheduled for Thursday, March 10, 2017 at 1450.        Pelvic/Bimanual No Pap smear completed today since last Pap smear was in May 2018 per patient. Pap smear not indicated per BCCCP guidelines.   Smoking History: Patient has never smoked.  Patient Navigation: Patient education provided. Access to services provided for patient through [facility] program.

## 2017-03-14 ENCOUNTER — Other Ambulatory Visit (HOSPITAL_COMMUNITY): Payer: Self-pay | Admitting: Obstetrics and Gynecology

## 2017-03-14 ENCOUNTER — Encounter (HOSPITAL_COMMUNITY): Payer: Self-pay | Admitting: *Deleted

## 2017-03-14 DIAGNOSIS — R599 Enlarged lymph nodes, unspecified: Secondary | ICD-10-CM

## 2017-03-15 ENCOUNTER — Ambulatory Visit
Admission: RE | Admit: 2017-03-15 | Discharge: 2017-03-15 | Disposition: A | Discharge status: Home / Self Care | Payer: No Typology Code available for payment source | Source: Ambulatory Visit | Attending: Obstetrics and Gynecology | Admitting: Obstetrics and Gynecology

## 2017-03-15 ENCOUNTER — Other Ambulatory Visit (HOSPITAL_COMMUNITY)
Admission: RE | Admit: 2017-03-15 | Discharge: 2017-03-15 | Disposition: A | Discharge status: Home / Self Care | Payer: No Typology Code available for payment source | Source: Ambulatory Visit | Attending: Diagnostic Radiology | Admitting: Diagnostic Radiology

## 2017-03-15 DIAGNOSIS — R599 Enlarged lymph nodes, unspecified: Secondary | ICD-10-CM

## 2017-10-04 DIAGNOSIS — I8290 Acute embolism and thrombosis of unspecified vein: Secondary | ICD-10-CM

## 2017-10-04 HISTORY — DX: Acute embolism and thrombosis of unspecified vein: I82.90

## 2018-01-19 ENCOUNTER — Ambulatory Visit (HOSPITAL_COMMUNITY)
Admission: EM | Admit: 2018-01-19 | Discharge: 2018-01-19 | Disposition: A | Discharge status: Home / Self Care | Payer: No Typology Code available for payment source | Attending: Family Medicine | Admitting: Family Medicine

## 2018-01-19 ENCOUNTER — Encounter (HOSPITAL_COMMUNITY): Payer: Self-pay | Admitting: Family Medicine

## 2018-01-19 ENCOUNTER — Telehealth (HOSPITAL_COMMUNITY): Payer: Self-pay | Admitting: Emergency Medicine

## 2018-01-19 DIAGNOSIS — Z9851 Tubal ligation status: Secondary | ICD-10-CM | POA: Insufficient documentation

## 2018-01-19 DIAGNOSIS — S29012A Strain of muscle and tendon of back wall of thorax, initial encounter: Secondary | ICD-10-CM | POA: Insufficient documentation

## 2018-01-19 DIAGNOSIS — Z113 Encounter for screening for infections with a predominantly sexual mode of transmission: Secondary | ICD-10-CM

## 2018-01-19 DIAGNOSIS — Z87891 Personal history of nicotine dependence: Secondary | ICD-10-CM | POA: Insufficient documentation

## 2018-01-19 DIAGNOSIS — Z202 Contact with and (suspected) exposure to infections with a predominantly sexual mode of transmission: Secondary | ICD-10-CM

## 2018-01-19 DIAGNOSIS — M546 Pain in thoracic spine: Secondary | ICD-10-CM | POA: Insufficient documentation

## 2018-01-19 DIAGNOSIS — X58XXXA Exposure to other specified factors, initial encounter: Secondary | ICD-10-CM | POA: Insufficient documentation

## 2018-01-19 MED ORDER — MELOXICAM 15 MG PO TABS: 15.0000 mg | ORAL_TABLET | Freq: Every day | ORAL | 0 refills | Status: DC

## 2018-01-19 MED ORDER — MELOXICAM 15 MG PO TABS: 15.0000 mg | ORAL_TABLET | Freq: Every day | ORAL | 0 refills | Status: AC

## 2018-01-19 NOTE — ED Triage Notes (Signed)
Pt here for upper back pain across her bra line. 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 She also wants to be checked for STDS. No symptoms.

## 2018-01-19 NOTE — ED Provider Notes (Signed)
[place]    CSN: [id number] Arrival date & time: 01/19/18  1440     History   Chief Complaint Chief Complaint  Patient presents with  . Back Pain  . Exposure to STD    HPI Emma Medina is a 47 y.o. female.   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  States that she has had 2 weeks of thoracic back pain that is worsened by movement as well as twisting and lifting.  States that she is cut out lifting at work but has not had any medications for her pain.  Denies any radiation of the pain into her extremities Patient is also requesting STD testing.  States that she recently slept with her ex and found out that his current girlfriend is positive for trichomonas.  She would like to be tested.  She has no symptoms.  The history is provided by the patient.  Back Pain  Location:  Thoracic spine Quality:  Aching Radiates to:  Does not radiate Pain severity:  Mild Pain is:  Same all the time Onset quality:  Gradual Duration:  2 weeks Timing:  Constant Progression:  Improving Chronicity:  New Context: lifting heavy objects   Context: not emotional stress, not falling and not jumping from heights   Relieved by:  Nothing Worsened by:  NSAIDs Ineffective treatments:  None tried Associated symptoms: no abdominal pain, no abdominal swelling, no bladder incontinence, no bowel incontinence, no chest pain, no dysuria, no fever, no leg pain, no numbness, no paresthesias, no pelvic pain, no perianal numbness, no tingling, no weakness and no weight loss   Risk factors: no hx of cancer, no hx of osteoporosis, no lack of exercise, no menopause, not obese, not pregnant, no recent surgery, no steroid use and no vascular disease     Past Medical History:  Diagnosis Date  . Sciatica     There are no active problems to display for this patient.   Past Surgical History:    Procedure Laterality Date  . TUBAL LIGATION      OB History    Gravida  8   Para  8   Term  8   Preterm      AB      Living  8     SAB      TAB      Ectopic      Multiple      Live Births  8            Home Medications    Prior to Admission medications   Medication Sig Start Date End Date Taking? Authorizing Provider  meloxicam (MOBIC) 15 MG tablet Take 1 tablet (15 mg total) by mouth daily. 01/19/18 02/18/18  Phebe Colla, PA-C    Family History Family History  Problem Relation Age of Onset  . Cancer Mother        brain and uterine  . Stroke Brother   . Diabetes Brother   . Breast cancer Neg Hx     Social History Social History   Tobacco Use  . Smoking status: Former Smoker    Packs/day: 1.00    Last attempt to quit: 07/02/2016    Years since quitting: 1.5  . Smokeless tobacco: Never Used  Substance Use Topics  . Alcohol use: No  . Drug use: Yes    Frequency: 7.0 times  per week    Types: Marijuana    Comment: smokes blunt on occasion     Allergies   Patient has no known allergies.   Review of Systems Review of Systems  Constitutional: Negative for chills, fever and weight loss.  HENT: Negative for ear pain and sore throat.   Eyes: Negative for pain and visual disturbance.  Respiratory: Negative for cough and shortness of breath.   Cardiovascular: Negative for chest pain and palpitations.  Gastrointestinal: Negative for abdominal pain, bowel incontinence and vomiting.  Genitourinary: Negative for bladder incontinence, dysuria, hematuria and pelvic pain.  Musculoskeletal: Positive for back pain. Negative for arthralgias.  Skin: Negative for color change and rash.  Neurological: Negative for tingling, seizures, syncope, weakness, numbness and paresthesias.  All other systems reviewed and are negative.    Physical Exam Triage Vital Signs ED Triage Vitals  Enc Vitals Group     BP 01/19/18 1517 128/76     Pulse Rate 01/19/18  1517 67     Resp 01/19/18 1517 18     Temp 01/19/18 1517 98.2 F (36.8 C)     Temp src --      SpO2 01/19/18 1517 98 %     Weight --      Height --      Head Circumference --      Peak Flow --      Pain Score 01/19/18 1515 6     Pain Loc --      Pain Edu? --      Excl. in [place]? --    No data found.  Updated Vital Signs BP 128/76   Pulse 67   Temp 98.2 F (36.8 C)   Resp 18   LMP 01/02/2018   SpO2 98%   Visual Acuity Right Eye Distance:   Left Eye Distance:   Bilateral Distance:    Right Eye Near:   Left Eye Near:    Bilateral Near:     Physical Exam  Constitutional: She appears well-developed and well-nourished. No distress.  HENT:  Head: Normocephalic and atraumatic.  Eyes: Conjunctivae are normal.  Neck: Neck supple.  Cardiovascular: Normal rate and regular rhythm.  No murmur heard. Pulmonary/Chest: Effort normal and breath sounds normal. No respiratory distress.  Abdominal: Soft. There is no tenderness.  Musculoskeletal: She exhibits no edema.       Thoracic back: She exhibits tenderness.       Back:  Neurological: She is alert. She has normal strength and normal reflexes. No cranial nerve deficit or sensory deficit. She displays a negative Romberg sign. GCS eye subscore is 4. GCS verbal subscore is 5. GCS motor subscore is 6.  Skin: Skin is warm and dry.  Psychiatric: She has a normal mood and affect.  Nursing note and vitals reviewed.    UC Treatments / Results  Labs (all labs ordered are listed, but only abnormal results are displayed) Labs Reviewed  CERVICOVAGINAL ANCILLARY ONLY    EKG None Radiology No results found.  Procedures Procedures (including critical care time)  Medications Ordered in UC Medications - No data to display   Initial Impression / Assessment and Plan / UC Course  I have reviewed the triage vital signs and the nursing notes.  Pertinent labs & imaging results that were available during my care of the patient were  reviewed by me and considered in my medical decision making (see chart for details).     2 weeks of thoracic back pain secondary to  lifting heavy objects.  No neurologic findings on exam Patient also requesting STD testing.  Patient has performed self swab.  Final Clinical Impressions(s) / UC Diagnoses   Final diagnoses:  Strain of thoracic back region    ED Discharge Orders        Ordered    meloxicam (MOBIC) 15 MG tablet  Daily     01/19/18 1524       Controlled Substance Prescriptions [place] Controlled Substance Registry consulted? Not Applicable   Phebe Colla, [place] 01/19/18 1530

## 2018-01-20 LAB — CERVICOVAGINAL ANCILLARY ONLY
Bacterial vaginitis: POSITIVE — AB
Candida vaginitis: NEGATIVE
Chlamydia: NEGATIVE
Neisseria Gonorrhea: NEGATIVE
Trichomonas: POSITIVE — AB

## 2018-01-23 MED ORDER — METRONIDAZOLE 500 MG PO TABS: 500.0000 mg | ORAL_TABLET | Freq: Two times a day (BID) | ORAL | 0 refills | Status: DC

## 2018-01-23 NOTE — Telephone Encounter (Signed)
Trichomonas is positive.  Rx metronidazole 500mg  bid x 7d #14 no refills was sent to the pharmacy of record. PT called and made aware.  Educated patient to refrain from sexual intercourse for 7 days to give the medicine time to work.  Sexual partners need to be notified and tested/treated.  Condoms may reduce risk of reinfection.   Recheck for further evaluation if symptoms are not improving. Pt verbalized understanding.  Also, Bacterial vaginosis is positive. This was not treated at the urgent care visit.  Patient complains of persistent symptoms.  Flagyl 500 mg BID x 7 days #14 no refills sent to patients pharmacy of choice per Dr. Valere Dross for positive trichomonas, will also treat this. Pt called and made aware of results and new prescription. Answered all questions and pt verbalized understanding.  Pt is very upset about her visit, requesting to speak with supervisor, Latrelle Dodrill RN made aware.

## 2018-02-20 ENCOUNTER — Inpatient Hospital Stay (HOSPITAL_COMMUNITY)
Admission: AD | Admit: 2018-02-20 | Discharge: 2018-02-20 | Disposition: A | Discharge status: Home / Self Care | Payer: Self-pay | Source: Ambulatory Visit | Attending: Obstetrics and Gynecology | Admitting: Obstetrics and Gynecology

## 2018-02-20 DIAGNOSIS — Z8049 Family history of malignant neoplasm of other genital organs: Secondary | ICD-10-CM | POA: Insufficient documentation

## 2018-02-20 DIAGNOSIS — Z823 Family history of stroke: Secondary | ICD-10-CM | POA: Insufficient documentation

## 2018-02-20 DIAGNOSIS — Z808 Family history of malignant neoplasm of other organs or systems: Secondary | ICD-10-CM | POA: Insufficient documentation

## 2018-02-20 DIAGNOSIS — Z833 Family history of diabetes mellitus: Secondary | ICD-10-CM | POA: Insufficient documentation

## 2018-02-20 DIAGNOSIS — Z9851 Tubal ligation status: Secondary | ICD-10-CM | POA: Insufficient documentation

## 2018-02-20 DIAGNOSIS — Z87891 Personal history of nicotine dependence: Secondary | ICD-10-CM | POA: Insufficient documentation

## 2018-02-20 DIAGNOSIS — Z113 Encounter for screening for infections with a predominantly sexual mode of transmission: Secondary | ICD-10-CM | POA: Insufficient documentation

## 2018-02-20 LAB — WET PREP, GENITAL
Clue Cells Wet Prep HPF POC: NONE SEEN
Sperm: NONE SEEN
TRICH WET PREP: NONE SEEN
YEAST WET PREP: NONE SEEN

## 2018-02-20 LAB — URINALYSIS, ROUTINE W REFLEX MICROSCOPIC
BILIRUBIN URINE: NEGATIVE
Bacteria, UA: NONE SEEN
GLUCOSE, UA: NEGATIVE mg/dL
KETONES UR: NEGATIVE mg/dL
LEUKOCYTES UA: NEGATIVE
Nitrite: NEGATIVE
PROTEIN: NEGATIVE mg/dL
Specific Gravity, Urine: 1.017 (ref 1.005–1.030)
pH: 6 (ref 5.0–8.0)

## 2018-02-20 LAB — POCT PREGNANCY, URINE: Preg Test, Ur: NEGATIVE

## 2018-02-20 LAB — CBC
HCT: 38.6 % (ref 36.0–46.0)
Hemoglobin: 12.5 g/dL (ref 12.0–15.0)
MCH: 25.4 pg — AB (ref 26.0–34.0)
MCHC: 32.4 g/dL (ref 30.0–36.0)
MCV: 78.3 fL (ref 78.0–100.0)
Platelets: 310 10*3/uL (ref 150–400)
RBC: 4.93 MIL/uL (ref 3.87–5.11)
RDW: 16.7 % — AB (ref 11.5–15.5)
WBC: 6.7 10*3/uL (ref 4.0–10.5)

## 2018-02-20 NOTE — Discharge Instructions (Signed)

## 2018-02-20 NOTE — MAU Provider Note (Signed)
[place]     CSN: [id number]  Arrival date and time: 02/20/18 1027   First Provider Initiated Contact with Patient 02/20/18 1306      Chief Complaint  Patient presents with  . Exposure to STD   HPI Emma Medina is a 47 y.o. non pregnant female who presents for STD screen. Reports being treated for trichomonas last month. Has had intercourse with the same partner once since then; the condom broke. States the partner was treated as well and they waited 2 weeks before SI. Here to confirm that the trich was cured. Denies any symptoms.   Past Medical History:  Diagnosis Date  . Sciatica     Past Surgical History:  Procedure Laterality Date  . TUBAL LIGATION      Family History  Problem Relation Age of Onset  . Cancer Mother        brain and uterine  . Stroke Brother   . Diabetes Brother   . Breast cancer Neg Hx     Social History   Tobacco Use  . Smoking status: Former Smoker    Packs/day: 1.00    Last attempt to quit: 07/02/2016    Years since quitting: 1.6  . Smokeless tobacco: Never Used  Substance Use Topics  . Alcohol use: No  . Drug use: Yes    Frequency: 7.0 times per week    Types: Marijuana    Comment: smokes blunt on occasion    Allergies: No Known Allergies  Medications Prior to Admission  Medication Sig Dispense Refill Last Dose  . metroNIDAZOLE (FLAGYL) 500 MG tablet Take 1 tablet (500 mg total) by mouth 2 (two) times daily. 14 tablet 0     Review of Systems  Constitutional: Negative.   Gastrointestinal: Negative.   Genitourinary: Negative.    Physical Exam   Last menstrual period 01/02/2018.  Physical Exam  Nursing note and vitals reviewed. Constitutional: She is oriented to person, place, and time. She appears well-developed and well-nourished. No distress.  HENT:  Head: Normocephalic and atraumatic.  Eyes: Conjunctivae are normal. Right eye exhibits no discharge. Left eye exhibits no discharge. No scleral icterus.  Neck: Normal  range of motion.  Respiratory: Effort normal. No respiratory distress.  Neurological: She is alert and oriented to person, place, and time.  Skin: She is not diaphoretic.  Psychiatric: She has a normal mood and affect. Her behavior is normal. Judgment and thought content normal.    MAU Course  Procedures Results for orders placed or performed during the hospital encounter of 02/20/18 (from the past 24 hour(s))  Urinalysis, Routine w reflex microscopic     Status: Abnormal   Collection Time: 02/20/18 11:22 AM  Result Value Ref Range   Color, Urine YELLOW YELLOW   APPearance CLEAR CLEAR   Specific Gravity, Urine 1.017 1.005 - 1.030   pH 6.0 5.0 - 8.0   Glucose, UA NEGATIVE NEGATIVE mg/dL   Hgb urine dipstick SMALL (A) NEGATIVE   Bilirubin Urine NEGATIVE NEGATIVE   Ketones, ur NEGATIVE NEGATIVE mg/dL   Protein, ur NEGATIVE NEGATIVE mg/dL   Nitrite NEGATIVE NEGATIVE   Leukocytes, UA NEGATIVE NEGATIVE   RBC / HPF 0-5 0 - 5 RBC/hpf   WBC, UA 0-5 0 - 5 WBC/hpf   Bacteria, UA NONE SEEN NONE SEEN   Squamous Epithelial / LPF 0-5 0 - 5   Mucus PRESENT   CBC     Status: Abnormal   Collection Time: 02/20/18 11:28 AM  Result Value  Ref Range   WBC 6.7 4.0 - 10.5 K/uL   RBC 4.93 3.87 - 5.11 MIL/uL   Hemoglobin 12.5 12.0 - 15.0 g/dL   HCT 38.6 36.0 - 46.0 %   MCV 78.3 78.0 - 100.0 fL   MCH 25.4 (L) 26.0 - 34.0 pg   MCHC 32.4 30.0 - 36.0 g/dL   RDW 16.7 (H) 11.5 - 15.5 %   Platelets 310 150 - 400 K/uL  Pregnancy, urine POC     Status: None   Collection Time: 02/20/18 11:36 AM  Result Value Ref Range   Preg Test, Ur NEGATIVE NEGATIVE  Wet prep, genital     Status: Abnormal   Collection Time: 02/20/18 12:20 PM  Result Value Ref Range   Yeast Wet Prep HPF POC NONE SEEN NONE SEEN   Trich, Wet Prep NONE SEEN NONE SEEN   Clue Cells Wet Prep HPF POC NONE SEEN NONE SEEN   WBC, Wet Prep HPF POC FEW (A) NONE SEEN   Sperm NONE SEEN     MDM UPT negative HIV, RPR, GC/CT & wet prep  collected Wet prep negative  Assessment and Plan  A; 1. Screen for STD (sexually transmitted disease)    P: Discharge home F/u with [facility] STD clinic as needed for future STD evals GC/CT, HIV, & RPR pending  Jorje Guild 02/20/2018, 1:07 PM

## 2018-02-20 NOTE — MAU Note (Signed)
Pt reports she was seen at Urgent Care and was treated for trich, took antibiotics. States she just wants to make sure it is cleared up

## 2018-02-21 LAB — RPR: RPR Ser Ql: NONREACTIVE

## 2018-02-21 LAB — GC/CHLAMYDIA PROBE AMP (~~LOC~~) NOT AT ARMC
CHLAMYDIA, DNA PROBE: NEGATIVE
Neisseria Gonorrhea: NEGATIVE

## 2018-02-21 LAB — HIV ANTIBODY (ROUTINE TESTING W REFLEX): HIV Screen 4th Generation wRfx: NONREACTIVE

## 2018-02-22 ENCOUNTER — Other Ambulatory Visit: Payer: Self-pay | Admitting: Obstetrics and Gynecology

## 2018-02-22 ENCOUNTER — Other Ambulatory Visit: Payer: Self-pay | Admitting: Obstetrics & Gynecology

## 2018-02-22 DIAGNOSIS — Z1231 Encounter for screening mammogram for malignant neoplasm of breast: Secondary | ICD-10-CM

## 2018-03-27 ENCOUNTER — Emergency Department (HOSPITAL_COMMUNITY): Payer: Self-pay

## 2018-03-27 ENCOUNTER — Emergency Department (HOSPITAL_COMMUNITY)
Admission: EM | Admit: 2018-03-27 | Discharge: 2018-03-27 | Disposition: A | Discharge status: Home / Self Care | Payer: Self-pay | Attending: Physician Assistant | Admitting: Physician Assistant

## 2018-03-27 ENCOUNTER — Other Ambulatory Visit: Payer: Self-pay

## 2018-03-27 ENCOUNTER — Encounter (HOSPITAL_COMMUNITY): Payer: Self-pay | Admitting: Emergency Medicine

## 2018-03-27 DIAGNOSIS — G8929 Other chronic pain: Secondary | ICD-10-CM

## 2018-03-27 DIAGNOSIS — Z87891 Personal history of nicotine dependence: Secondary | ICD-10-CM | POA: Insufficient documentation

## 2018-03-27 DIAGNOSIS — I10 Essential (primary) hypertension: Secondary | ICD-10-CM | POA: Insufficient documentation

## 2018-03-27 DIAGNOSIS — M25561 Pain in right knee: Secondary | ICD-10-CM | POA: Insufficient documentation

## 2018-03-27 HISTORY — DX: Essential (primary) hypertension: I10

## 2018-03-27 MED ORDER — PREDNISONE 10 MG (21) PO TBPK: ORAL_TABLET | ORAL | 0 refills | Status: DC

## 2018-03-27 MED ORDER — MELOXICAM 15 MG PO TABS: 15.0000 mg | ORAL_TABLET | Freq: Every day | ORAL | 0 refills | Status: DC

## 2018-03-27 NOTE — ED Triage Notes (Signed)
Pt reports several month history of right knee pain. Pt reports swelling of affected area. Pt ambulatory. NAd at present.

## 2018-03-27 NOTE — Discharge Instructions (Addendum)
Get help right away if: Your knee feels warm to the touch. You cannot move your knee. You have severe pain in your knee. You have chest pain. You have trouble breathing.

## 2018-03-27 NOTE — Progress Notes (Signed)
Orthopedic Tech Progress Note Patient Details:  Emma Medina 07/10/1971 [id number]  Ortho Devices Type of Ortho Device: Knee Sleeve Ortho Device/Splint Interventions: Application   Post Interventions Patient Tolerated: Well Instructions Provided: Care of device   [place] Pink 03/27/2018, 2:50 PM

## 2018-03-27 NOTE — ED Provider Notes (Signed)
[facility] EMERGENCY DEPARTMENT Provider Note   CSN: [id number] Arrival date & time: 03/27/18  1156     History   Chief Complaint Chief Complaint  Patient presents with  . Knee Pain    HPI Emma Medina is a 47 y.o. female who presents the emergency department for evaluation of right knee pain.  Patient states that she has had pain in her right knee for "a really long time."  She states that she went out with some friends dancing the other night and now she has exquisite pain in the right knee.  She states that she sometimes has some catching and feelings of instability in the right knee.  She feels like it is swollen.  Sometimes seems to radiate up into her thigh and hip.  She denies a history of hip injury.  She does have a history of sciatica on the right side.  HPI  Past Medical History:  Diagnosis Date  . Hypertension   . Sciatica     There are no active problems to display for this patient.   Past Surgical History:  Procedure Laterality Date  . TUBAL LIGATION       OB History    Gravida  8   Para  8   Term  8   Preterm      AB      Living  8     SAB      TAB      Ectopic      Multiple      Live Births  8            Home Medications    Prior to Admission medications   Not on File    Family History Family History  Problem Relation Age of Onset  . Cancer Mother        brain and uterine  . Stroke Brother   . Diabetes Brother   . Breast cancer Neg Hx     Social History Social History   Tobacco Use  . Smoking status: Former Smoker    Packs/day: 1.00    Last attempt to quit: 07/02/2016    Years since quitting: 1.7  . Smokeless tobacco: Never Used  Substance Use Topics  . Alcohol use: No  . Drug use: Yes    Frequency: 7.0 times per week    Types: Marijuana    Comment: smokes blunt on occasion     Allergies   Patient has no known allergies.   Review of Systems Review of Systems Ten systems  reviewed and are negative for acute change, except as noted in the HPI.    Physical Exam Updated Vital Signs BP (!) 132/91 (BP Location: Right Arm)   Pulse 72   Temp 99.2 F (37.3 C) (Oral)   Resp 16   Ht 5\' 6"  (1.676 m)   Wt 108.4 kg (239 lb)   LMP 03/04/2018   SpO2 100%   BMI 38.58 kg/m   Physical Exam  Constitutional: She is oriented to person, place, and time. She appears well-developed and well-nourished. No distress.  HENT:  Head: Normocephalic and atraumatic.  Eyes: Conjunctivae are normal. No scleral icterus.  Neck: Normal range of motion.  Cardiovascular: Normal rate, regular rhythm and normal heart sounds. Exam reveals no gallop and no friction rub.  No murmur heard. Pulmonary/Chest: Effort normal and breath sounds normal. No respiratory distress.  Abdominal: Soft. Bowel sounds are normal. She exhibits no distension and  no mass. There is no tenderness. There is no guarding.  Musculoskeletal:       Right hip: Normal.       Right knee: She exhibits no swelling, no effusion, no ecchymosis, no deformity, normal alignment, no LCL laxity and no MCL laxity. Tenderness found. Medial joint line tenderness noted.       Right ankle: Normal.  Neurological: She is alert and oriented to person, place, and time.  Skin: Skin is warm and dry. She is not diaphoretic.  Psychiatric: Her behavior is normal.  Nursing note and vitals reviewed.    ED Treatments / Results  Labs (all labs ordered are listed, but only abnormal results are displayed) Labs Reviewed - No data to display  EKG None  Radiology Dg Knee Complete 4 Views Right  Result Date: 03/27/2018 CLINICAL DATA:  Chronic right knee pain especially with weight-bearing. History of old right knee injury. EXAM: RIGHT KNEE - COMPLETE 4+ VIEW COMPARISON:  None in PACs FINDINGS: The bones are subjectively adequately mineralized. The joint spaces are reasonably well-maintained. There is no stone significant osteophyte formation.  There is no joint effusion. There is no acute or healing fracture or dislocation. IMPRESSION: There is no acute or significant chronic bony abnormality of the right knee. Electronically Signed   By: David  Martinique M.D.   On: 03/27/2018 13:19    Procedures Procedures (including critical care time)  Medications Ordered in ED Medications - No data to display   Initial Impression / Assessment and Plan / ED Course  I have reviewed the triage vital signs and the nursing notes.  Pertinent labs & imaging results that were available during my care of the patient were reviewed by me and considered in my medical decision making (see chart for details).    Patient X-Ray negative for obvious fracture or dislocation. Pain managed in ED. Pt advised to follow up with orthopedics if symptoms persist for possibility of missed fracture diagnosis. Patient given brace while in ED, conservative therapy recommended and discussed. Patient will be dc home & is agreeable with above plan.   Final Clinical Impressions(s) / ED Diagnoses   Final diagnoses:  None    ED Discharge Orders    None       Margarita Mail, PA-C 03/27/18 1450    [place], Fredia Sorrow, MD 03/28/18 [phone]

## 2018-03-27 NOTE — ED Notes (Signed)
Ortho paged. 

## 2018-04-14 DIAGNOSIS — Z111 Encounter for screening for respiratory tuberculosis: Secondary | ICD-10-CM

## 2018-04-20 LAB — TB SKIN TEST
INDURATION: 0 mm
TB Skin Test: NEGATIVE

## 2018-04-20 NOTE — Congregational Nurse Program (Signed)
Congregational Nurse Program Note  Date of Encounter: 04/14/2018  Past Medical History: Past Medical History:  Diagnosis Date  . Hypertension   . Sciatica     Encounter Details: CNP Questionnaire - 04/17/18 1755      Questionnaire   Patient Status  Not Applicable    Race  Black or African American    Location Patient Served At  Not Applicable    Insurance  Not Applicable    Uninsured  Uninsured (NEW 1x/quarter)    Food  No food insecurities    Housing/Utilities  Yes, have permanent housing    Transportation  No transportation needs    Interpersonal Safety  Yes, feel physically and emotionally safe where you currently live    Medication  Yes, have medication insecurities    Medical Provider  Yes    Referrals  Not Applicable    ED Visit Averted  Not Applicable    Life-Saving Intervention Made  Not Applicable      TB skin test.  Left forearm - negative results

## 2018-05-13 ENCOUNTER — Encounter (HOSPITAL_COMMUNITY): Payer: Self-pay | Admitting: *Deleted

## 2018-05-13 ENCOUNTER — Ambulatory Visit (HOSPITAL_COMMUNITY)
Admission: EM | Admit: 2018-05-13 | Discharge: 2018-05-13 | Disposition: A | Discharge status: Home / Self Care | Payer: Self-pay | Attending: Internal Medicine | Admitting: Internal Medicine

## 2018-05-13 DIAGNOSIS — M5432 Sciatica, left side: Secondary | ICD-10-CM

## 2018-05-13 MED ORDER — PREDNISONE 20 MG PO TABS: 40.0000 mg | ORAL_TABLET | Freq: Every day | ORAL | 0 refills | Status: DC

## 2018-05-13 MED ORDER — KETOROLAC TROMETHAMINE 60 MG/2ML IM SOLN
INTRAMUSCULAR | Status: AC
  Filled 2018-05-13: qty 2

## 2018-05-13 MED ORDER — KETOROLAC TROMETHAMINE 60 MG/2ML IM SOLN
60.0000 mg | Freq: Once | INTRAMUSCULAR | Status: AC
  Administered 2018-05-13: 60 mg via INTRAMUSCULAR

## 2018-05-13 NOTE — ED Triage Notes (Signed)
Reports sudden onset left buttock pain radiating down entire LLE with numbness today while driving.

## 2018-05-13 NOTE — Discharge Instructions (Signed)
Light and regular activity as tolerated.  Please increase your prednisone to 40mg  a day, I have sent this prescription.  You may use muscle relaxers as needed as ordered.  Please follow up with your primary care provider as needed for any worsening or persistent symptoms.  If worsening of pain, numbness, tingling, weakness, urinary or stool incontinence, redness or swelling please return or go to the Er.

## 2018-05-13 NOTE — ED Provider Notes (Signed)
[facility]    CSN: [id number] Arrival date & time: 05/13/18  1758     History   Chief Complaint Chief Complaint  Patient presents with  . Leg Pain    HPI Emma Medina is a 47 y.o. female.   Emma Medina presents with complaints of pain to left hip, buttock which radiates down posterior thigh and even to foot at times. Prior to arrival had more severe throbbing pain. Started suddenly at 1430 this afternoon. Worked today, works as a Quarry manager. Denies any heavy lifting or specific injury. No numbness or tingling. No weakness. No loss of bladder or bowel. Has a history of sciatica to right leg, states this felt somewhat different. She is currently taking 10mg  of prednisone a day for a rash which she took as well as a muscle relaxer which didn't seem to help. Pain 8/10. Certain positions seem to worsen the pain. Hx of htn and sciatica.     ROS per HPI.      Past Medical History:  Diagnosis Date  . Hypertension   . Sciatica     There are no active problems to display for this patient.   Past Surgical History:  Procedure Laterality Date  . TUBAL LIGATION      OB History    Gravida  8   Para  8   Term  8   Preterm      AB      Living  8     SAB      TAB      Ectopic      Multiple      Live Births  8            Home Medications    Prior to Admission medications   Medication Sig Start Date End Date Taking? Authorizing Provider  Cyclobenzaprine HCl (FLEXERIL PO) Take by mouth.   Yes [provider]  predniSONE (DELTASONE) 20 MG tablet Take 2 tablets (40 mg total) by mouth daily with breakfast for 5 days. 05/13/18 05/18/18  Zigmund Gottron, NP    Family History Family History  Problem Relation Age of Onset  . Cancer Mother        brain and uterine  . Stroke Brother   . Diabetes Brother   . Breast cancer Neg Hx     Social History Social History   Tobacco Use  . Smoking status: Former Smoker    Packs/day: 1.00    Last  attempt to quit: 07/02/2016    Years since quitting: 1.8  . Smokeless tobacco: Never Used  Substance Use Topics  . Alcohol use: No  . Drug use: Yes    Frequency: 7.0 times per week    Types: Marijuana    Comment: smokes blunt on occasion     Allergies   Patient has no known allergies.   Review of Systems Review of Systems   Physical Exam Triage Vital Signs ED Triage Vitals  Enc Vitals Group     BP 05/13/18 1829 135/78     Pulse Rate 05/13/18 1829 73     Resp 05/13/18 1829 20     Temp 05/13/18 1829 98.1 F (36.7 C)     Temp Source 05/13/18 1829 Oral     SpO2 05/13/18 1829 97 %     Weight --      Height --      Head Circumference --      Peak Flow --  Pain Score 05/13/18 1830 10     Pain Loc --      Pain Edu? --      Excl. in [place]? --    No data found.  Updated Vital Signs BP 135/78   Pulse 73   Temp 98.1 F (36.7 C) (Oral)   Resp 20   LMP 04/30/2018 (Approximate)   SpO2 97%    Physical Exam  Constitutional: She is oriented to person, place, and time. She appears well-developed and well-nourished. No distress.  Cardiovascular: Normal rate, regular rhythm and normal heart sounds.  Pulmonary/Chest: Effort normal and breath sounds normal.  Musculoskeletal:       Left hip: She exhibits tenderness. She exhibits normal range of motion, normal strength, no bony tenderness, no swelling, no crepitus, no deformity and no laceration.       Lumbar back: She exhibits tenderness and pain. She exhibits normal range of motion, no bony tenderness, no swelling and no edema.  Left buttock and soft tissue hip with tenderness, no specific bony tenderness; full ROM noted; pain with straight leg raise; mild pain with left hip flexion; pain to posterior knee with straight leg raise; strength equal bilaterally; gross sensation intact; +2 pedal pulses bilaterally; moving around on table with legs in multiple positions without difficulty; ambulatory without difficulty   Neurological:  She is alert and oriented to person, place, and time. Cranial nerve deficit:   Skin: Skin is warm and dry.     UC Treatments / Results  Labs (all labs ordered are listed, but only abnormal results are displayed) Labs Reviewed - No data to display  EKG None  Radiology No results found.  Procedures Procedures (including critical care time)  Medications Ordered in UC Medications  ketorolac (TORADOL) injection 60 mg (has no administration in time range)    Initial Impression / Assessment and Plan / UC Course  I have reviewed the triage vital signs and the nursing notes.  Pertinent labs & imaging results that were available during my care of the patient were reviewed by me and considered in my medical decision making (see chart for details).     No red flag findings on exam. No specific injury or bony point tenderness. Appears consistent with sciatica. Increased prednisone dose to continue to treat rash as well as help with pain. May use muscle relaxer as needed. Return precautions provided. If symptoms worsen or do not improve in the next week to return to be seen or to follow up with PCP.  Patient verbalized understanding and agreeable to plan.  Ambulatory out of clinic without difficulty.   Final Clinical Impressions(s) / UC Diagnoses   Final diagnoses:  Sciatica of left side     Discharge Instructions     Light and regular activity as tolerated.  Please increase your prednisone to 40mg  a day, I have sent this prescription.  You may use muscle relaxers as needed as ordered.  Please follow up with your primary care provider as needed for any worsening or persistent symptoms.  If worsening of pain, numbness, tingling, weakness, urinary or stool incontinence, redness or swelling please return or go to the Er.    ED Prescriptions    Medication Sig Dispense Auth. Provider   predniSONE (DELTASONE) 20 MG tablet Take 2 tablets (40 mg total) by mouth daily with breakfast for 5  days. 10 tablet Zigmund Gottron, NP     Controlled Substance Prescriptions [place] Controlled Substance Registry consulted? Not Applicable   [place],  Malachy Moan, NP 05/14/18 [phone]

## 2018-05-18 ENCOUNTER — Inpatient Hospital Stay (HOSPITAL_COMMUNITY)
Admission: EM | Admit: 2018-05-18 | Discharge: 2018-05-23 | DRG: 272 | Disposition: A | Discharge status: Home / Self Care | Payer: Self-pay | Attending: Vascular Surgery | Admitting: Vascular Surgery

## 2018-05-18 ENCOUNTER — Emergency Department (HOSPITAL_BASED_OUTPATIENT_CLINIC_OR_DEPARTMENT_OTHER): Payer: Self-pay

## 2018-05-18 ENCOUNTER — Emergency Department (HOSPITAL_COMMUNITY): Payer: Self-pay | Admitting: Anesthesiology

## 2018-05-18 ENCOUNTER — Encounter (HOSPITAL_COMMUNITY)
Admission: EM | Disposition: A | Discharge status: Home / Self Care | Payer: Self-pay | Source: Home / Self Care | Attending: Vascular Surgery

## 2018-05-18 ENCOUNTER — Encounter (HOSPITAL_COMMUNITY): Payer: Self-pay | Admitting: Emergency Medicine

## 2018-05-18 ENCOUNTER — Emergency Department (HOSPITAL_COMMUNITY): Payer: Self-pay

## 2018-05-18 DIAGNOSIS — I739 Peripheral vascular disease, unspecified: Principal | ICD-10-CM | POA: Diagnosis present

## 2018-05-18 DIAGNOSIS — Z87891 Personal history of nicotine dependence: Secondary | ICD-10-CM

## 2018-05-18 DIAGNOSIS — Z6838 Body mass index (BMI) 38.0-38.9, adult: Secondary | ICD-10-CM

## 2018-05-18 DIAGNOSIS — Z9889 Other specified postprocedural states: Secondary | ICD-10-CM

## 2018-05-18 DIAGNOSIS — I10 Essential (primary) hypertension: Secondary | ICD-10-CM | POA: Diagnosis present

## 2018-05-18 DIAGNOSIS — I8290 Acute embolism and thrombosis of unspecified vein: Secondary | ICD-10-CM | POA: Diagnosis present

## 2018-05-18 DIAGNOSIS — Z8249 Family history of ischemic heart disease and other diseases of the circulatory system: Secondary | ICD-10-CM

## 2018-05-18 DIAGNOSIS — I998 Other disorder of circulatory system: Secondary | ICD-10-CM

## 2018-05-18 DIAGNOSIS — E669 Obesity, unspecified: Secondary | ICD-10-CM | POA: Diagnosis present

## 2018-05-18 HISTORY — PX: THROMBECTOMY FEMORAL ARTERY: SHX6406

## 2018-05-18 HISTORY — PX: LOWER EXTREMITY ANGIOGRAPHY: CATH118251

## 2018-05-18 HISTORY — PX: ABDOMINAL AORTOGRAM: CATH118222

## 2018-05-18 LAB — I-STAT CHEM 8, ED
BUN: 9 mg/dL (ref 6–20)
CALCIUM ION: 1.1 mmol/L — AB (ref 1.15–1.40)
CHLORIDE: 104 mmol/L (ref 98–111)
Creatinine, Ser: 0.8 mg/dL (ref 0.44–1.00)
GLUCOSE: 88 mg/dL (ref 70–99)
HCT: 39 % (ref 36.0–46.0)
Hemoglobin: 13.3 g/dL (ref 12.0–15.0)
Potassium: 3.4 mmol/L — ABNORMAL LOW (ref 3.5–5.1)
Sodium: 140 mmol/L (ref 135–145)
TCO2: 25 mmol/L (ref 22–32)

## 2018-05-18 LAB — CBC WITH DIFFERENTIAL/PLATELET
Abs Immature Granulocytes: 0 10*3/uL (ref 0.0–0.1)
BASOS PCT: 1 %
Basophils Absolute: 0.1 10*3/uL (ref 0.0–0.1)
EOS ABS: 0.2 10*3/uL (ref 0.0–0.7)
EOS PCT: 2 %
HCT: 39.3 % (ref 36.0–46.0)
Hemoglobin: 12 g/dL (ref 12.0–15.0)
Immature Granulocytes: 0 %
Lymphocytes Relative: 39 %
Lymphs Abs: 3.6 10*3/uL (ref 0.7–4.0)
MCH: 24.3 pg — AB (ref 26.0–34.0)
MCHC: 30.5 g/dL (ref 30.0–36.0)
MCV: 79.7 fL (ref 78.0–100.0)
MONO ABS: 0.6 10*3/uL (ref 0.1–1.0)
Monocytes Relative: 7 %
NEUTROS ABS: 4.7 10*3/uL (ref 1.7–7.7)
Neutrophils Relative %: 51 %
PLATELETS: 288 10*3/uL (ref 150–400)
RBC: 4.93 MIL/uL (ref 3.87–5.11)
RDW: 15.9 % — ABNORMAL HIGH (ref 11.5–15.5)
WBC: 9.2 10*3/uL (ref 4.0–10.5)

## 2018-05-18 LAB — HEMOGLOBIN A1C
Hgb A1c MFr Bld: 6 % — ABNORMAL HIGH (ref 4.8–5.6)
Mean Plasma Glucose: 125.5 mg/dL

## 2018-05-18 LAB — I-STAT CG4 LACTIC ACID, ED: Lactic Acid, Venous: 1.25 mmol/L (ref 0.5–1.9)

## 2018-05-18 SURGERY — THROMBECTOMY, ARTERY, FEMORAL
Anesthesia: General | Laterality: Left

## 2018-05-18 SURGERY — LOWER EXTREMITY ANGIOGRAPHY
Anesthesia: LOCAL

## 2018-05-18 MED ORDER — MIDAZOLAM HCL 2 MG/2ML IJ SOLN
INTRAMUSCULAR | Status: AC
  Filled 2018-05-18: qty 2

## 2018-05-18 MED ORDER — HEPARIN SODIUM (PORCINE) 1000 UNIT/ML IJ SOLN
INTRAMUSCULAR | Status: DC | PRN
  Administered 2018-05-18: 11000 [IU] via INTRAVENOUS
  Administered 2018-05-18 (×2): 5000 [IU] via INTRAVENOUS

## 2018-05-18 MED ORDER — HYDROMORPHONE HCL 1 MG/ML IJ SOLN
INTRAMUSCULAR | Status: AC
  Filled 2018-05-18: qty 1

## 2018-05-18 MED ORDER — HYDROMORPHONE HCL 1 MG/ML IJ SOLN
0.2500 mg | INTRAMUSCULAR | Status: DC | PRN
  Administered 2018-05-18 (×3): 0.5 mg via INTRAVENOUS

## 2018-05-18 MED ORDER — BISACODYL 10 MG RE SUPP: 10.0000 mg | Freq: Every day | RECTAL | Status: DC | PRN

## 2018-05-18 MED ORDER — SODIUM CHLORIDE 0.9 % IV BOLUS
500.0000 mL | Freq: Once | INTRAVENOUS | Status: AC
  Administered 2018-05-18: 500 mL via INTRAVENOUS

## 2018-05-18 MED ORDER — POTASSIUM CHLORIDE CRYS ER 20 MEQ PO TBCR
20.0000 meq | EXTENDED_RELEASE_TABLET | Freq: Every day | ORAL | Status: DC | PRN
Start: 2018-05-18 — End: 2018-05-24

## 2018-05-18 MED ORDER — HEPARIN SODIUM (PORCINE) 1000 UNIT/ML IJ SOLN
INTRAMUSCULAR | Status: AC
  Filled 2018-05-18: qty 2

## 2018-05-18 MED ORDER — POLYETHYLENE GLYCOL 3350 17 G PO PACK: 17.0000 g | PACK | Freq: Every day | ORAL | Status: DC | PRN

## 2018-05-18 MED ORDER — IODIXANOL 320 MG/ML IV SOLN
INTRAVENOUS | Status: DC | PRN
  Administered 2018-05-18 (×2): 50 mL via INTRA_ARTERIAL

## 2018-05-18 MED ORDER — IODIXANOL 320 MG/ML IV SOLN
INTRAVENOUS | Status: DC | PRN
  Administered 2018-05-18: 120 mL via INTRAVENOUS

## 2018-05-18 MED ORDER — PHENOL 1.4 % MT LIQD
1.0000 | OROMUCOSAL | Status: DC | PRN
  Administered 2018-05-23: 1 via OROMUCOSAL
  Filled 2018-05-18: qty 177

## 2018-05-18 MED ORDER — LIDOCAINE HCL (PF) 1 % IJ SOLN
INTRAMUSCULAR | Status: AC
  Filled 2018-05-18: qty 30

## 2018-05-18 MED ORDER — FENTANYL CITRATE (PF) 250 MCG/5ML IJ SOLN
INTRAMUSCULAR | Status: AC
  Filled 2018-05-18: qty 5

## 2018-05-18 MED ORDER — MEPERIDINE HCL 50 MG/ML IJ SOLN: 6.2500 mg | INTRAMUSCULAR | Status: DC | PRN

## 2018-05-18 MED ORDER — MORPHINE SULFATE (PF) 2 MG/ML IV SOLN
2.0000 mg | INTRAVENOUS | Status: DC | PRN
  Administered 2018-05-18 – 2018-05-22 (×8): 2 mg via INTRAVENOUS
  Filled 2018-05-18 (×9): qty 1

## 2018-05-18 MED ORDER — SODIUM CHLORIDE 0.9 % IV SOLN
INTRAVENOUS | Status: DC | PRN
  Administered 2018-05-18: 40 ug/min via INTRAVENOUS

## 2018-05-18 MED ORDER — HEPARIN (PORCINE) IN NACL 1000-0.9 UT/500ML-% IV SOLN
INTRAVENOUS | Status: DC | PRN
  Administered 2018-05-18 (×2): 500 mL

## 2018-05-18 MED ORDER — CEFAZOLIN SODIUM 1 G IJ SOLR
INTRAMUSCULAR | Status: AC
  Filled 2018-05-18: qty 20

## 2018-05-18 MED ORDER — ONDANSETRON HCL 4 MG/2ML IJ SOLN
INTRAMUSCULAR | Status: AC
  Filled 2018-05-18: qty 2

## 2018-05-18 MED ORDER — LIDOCAINE 2% (20 MG/ML) 5 ML SYRINGE
INTRAMUSCULAR | Status: AC
  Filled 2018-05-18: qty 10

## 2018-05-18 MED ORDER — LACTATED RINGERS IV SOLN
INTRAVENOUS | Status: DC | PRN
  Administered 2018-05-18 (×2): via INTRAVENOUS

## 2018-05-18 MED ORDER — LIDOCAINE HCL (PF) 1 % IJ SOLN
INTRAMUSCULAR | Status: DC | PRN
  Administered 2018-05-18: 18 mL

## 2018-05-18 MED ORDER — SODIUM CHLORIDE 0.9 % IV SOLN
INTRAVENOUS | Status: AC | PRN
  Administered 2018-05-18: 11664 mL via INTRAVENOUS

## 2018-05-18 MED ORDER — EPHEDRINE SULFATE 50 MG/ML IJ SOLN
INTRAMUSCULAR | Status: DC | PRN
  Administered 2018-05-18 (×3): 10 mg via INTRAVENOUS
  Administered 2018-05-18: 5 mg via INTRAVENOUS

## 2018-05-18 MED ORDER — PHENYLEPHRINE 40 MCG/ML (10ML) SYRINGE FOR IV PUSH (FOR BLOOD PRESSURE SUPPORT)
PREFILLED_SYRINGE | INTRAVENOUS | Status: AC
  Filled 2018-05-18: qty 10

## 2018-05-18 MED ORDER — ROCURONIUM BROMIDE 50 MG/5ML IV SOSY
PREFILLED_SYRINGE | INTRAVENOUS | Status: DC | PRN
  Administered 2018-05-18: 50 mg via INTRAVENOUS

## 2018-05-18 MED ORDER — GUAIFENESIN-DM 100-10 MG/5ML PO SYRP: 15.0000 mL | ORAL_SOLUTION | ORAL | Status: DC | PRN

## 2018-05-18 MED ORDER — SUGAMMADEX SODIUM 200 MG/2ML IV SOLN
INTRAVENOUS | Status: DC | PRN
  Administered 2018-05-18: 225 mg via INTRAVENOUS

## 2018-05-18 MED ORDER — ROCURONIUM BROMIDE 50 MG/5ML IV SOSY
PREFILLED_SYRINGE | INTRAVENOUS | Status: AC
  Filled 2018-05-18: qty 5

## 2018-05-18 MED ORDER — PANTOPRAZOLE SODIUM 40 MG PO TBEC
40.0000 mg | DELAYED_RELEASE_TABLET | Freq: Every day | ORAL | Status: DC
  Administered 2018-05-19 – 2018-05-23 (×5): 40 mg via ORAL
  Filled 2018-05-18 (×5): qty 1

## 2018-05-18 MED ORDER — PHENYLEPHRINE HCL 10 MG/ML IJ SOLN
INTRAMUSCULAR | Status: DC | PRN
  Administered 2018-05-18 (×2): 120 ug via INTRAVENOUS
  Administered 2018-05-18 (×2): 80 ug via INTRAVENOUS
  Administered 2018-05-18 (×2): 120 ug via INTRAVENOUS

## 2018-05-18 MED ORDER — ACETAMINOPHEN 325 MG RE SUPP: 325.0000 mg | RECTAL | Status: DC | PRN

## 2018-05-18 MED ORDER — DEXAMETHASONE SODIUM PHOSPHATE 10 MG/ML IJ SOLN
INTRAMUSCULAR | Status: AC
  Filled 2018-05-18: qty 1

## 2018-05-18 MED ORDER — PROMETHAZINE HCL 25 MG/ML IJ SOLN: 6.2500 mg | INTRAMUSCULAR | Status: DC | PRN

## 2018-05-18 MED ORDER — HEPARIN (PORCINE) IN NACL 1000-0.9 UT/500ML-% IV SOLN
INTRAVENOUS | Status: AC
  Filled 2018-05-18: qty 1000

## 2018-05-18 MED ORDER — SODIUM CHLORIDE 0.9 % IV SOLN
INTRAVENOUS | Status: DC | PRN
  Administered 2018-05-18: 500 mL

## 2018-05-18 MED ORDER — SODIUM CHLORIDE 0.9 % IV SOLN
INTRAVENOUS | Status: DC
  Administered 2018-05-18 – 2018-05-22 (×5): via INTRAVENOUS

## 2018-05-18 MED ORDER — ALUM & MAG HYDROXIDE-SIMETH 200-200-20 MG/5ML PO SUSP
15.0000 mL | ORAL | Status: DC | PRN
  Filled 2018-05-18: qty 30

## 2018-05-18 MED ORDER — SODIUM CHLORIDE 0.9 % IV SOLN
INTRAVENOUS | Status: AC
  Filled 2018-05-18: qty 1.2

## 2018-05-18 MED ORDER — BSS IO SOLN
INTRAOCULAR | Status: AC
  Filled 2018-05-18: qty 30

## 2018-05-18 MED ORDER — SODIUM CHLORIDE 0.9 % IV SOLN: 500.0000 mL | Freq: Once | INTRAVENOUS | Status: DC | PRN

## 2018-05-18 MED ORDER — ONDANSETRON HCL 4 MG/2ML IJ SOLN
4.0000 mg | Freq: Four times a day (QID) | INTRAMUSCULAR | Status: DC | PRN
  Administered 2018-05-19: 4 mg via INTRAVENOUS
  Filled 2018-05-18: qty 2

## 2018-05-18 MED ORDER — CEFAZOLIN SODIUM-DEXTROSE 2-4 GM/100ML-% IV SOLN
2.0000 g | Freq: Three times a day (TID) | INTRAVENOUS | Status: AC
  Administered 2018-05-18 – 2018-05-19 (×2): 2 g via INTRAVENOUS
  Filled 2018-05-18 (×3): qty 100

## 2018-05-18 MED ORDER — HEPARIN SODIUM (PORCINE) 1000 UNIT/ML IJ SOLN
INTRAMUSCULAR | Status: DC | PRN
  Administered 2018-05-18: 11000 [IU] via INTRAVENOUS

## 2018-05-18 MED ORDER — DEXAMETHASONE SODIUM PHOSPHATE 10 MG/ML IJ SOLN
INTRAMUSCULAR | Status: DC | PRN
  Administered 2018-05-18: 10 mg via INTRAVENOUS

## 2018-05-18 MED ORDER — PROPOFOL 10 MG/ML IV BOLUS
INTRAVENOUS | Status: AC
  Filled 2018-05-18: qty 20

## 2018-05-18 MED ORDER — FENTANYL CITRATE (PF) 250 MCG/5ML IJ SOLN
INTRAMUSCULAR | Status: DC | PRN
  Administered 2018-05-18 (×6): 50 ug via INTRAVENOUS

## 2018-05-18 MED ORDER — METOPROLOL TARTRATE 5 MG/5ML IV SOLN: 2.0000 mg | INTRAVENOUS | Status: DC | PRN

## 2018-05-18 MED ORDER — LIDOCAINE 2% (20 MG/ML) 5 ML SYRINGE
INTRAMUSCULAR | Status: DC | PRN
  Administered 2018-05-18: 60 mg via INTRAVENOUS

## 2018-05-18 MED ORDER — MAGNESIUM SULFATE 2 GM/50ML IV SOLN: 2.0000 g | Freq: Every day | INTRAVENOUS | Status: DC | PRN

## 2018-05-18 MED ORDER — 0.9 % SODIUM CHLORIDE (POUR BTL) OPTIME
TOPICAL | Status: DC | PRN
  Administered 2018-05-18: 1000 mL

## 2018-05-18 MED ORDER — CEFAZOLIN SODIUM-DEXTROSE 2-3 GM-%(50ML) IV SOLR
INTRAVENOUS | Status: DC | PRN
  Administered 2018-05-18: 2 g via INTRAVENOUS

## 2018-05-18 MED ORDER — MORPHINE SULFATE (PF) 4 MG/ML IV SOLN
4.0000 mg | Freq: Once | INTRAVENOUS | Status: AC
  Administered 2018-05-18: 4 mg via INTRAVENOUS
  Filled 2018-05-18: qty 1

## 2018-05-18 MED ORDER — LABETALOL HCL 5 MG/ML IV SOLN: 10.0000 mg | INTRAVENOUS | Status: DC | PRN

## 2018-05-18 MED ORDER — ONDANSETRON HCL 4 MG/2ML IJ SOLN
INTRAMUSCULAR | Status: DC | PRN
  Administered 2018-05-18: 4 mg via INTRAVENOUS

## 2018-05-18 MED ORDER — OXYCODONE-ACETAMINOPHEN 5-325 MG PO TABS
1.0000 | ORAL_TABLET | ORAL | Status: DC | PRN
  Administered 2018-05-19: 2 via ORAL
  Administered 2018-05-21 (×2): 1 via ORAL
  Administered 2018-05-22: 2 via ORAL
  Administered 2018-05-22 – 2018-05-23 (×3): 1 via ORAL
  Filled 2018-05-18 (×3): qty 2
  Filled 2018-05-18: qty 1
  Filled 2018-05-18: qty 2
  Filled 2018-05-18 (×2): qty 1
  Filled 2018-05-18: qty 2
  Filled 2018-05-18: qty 1

## 2018-05-18 MED ORDER — HEPARIN (PORCINE) IN NACL 100-0.45 UNIT/ML-% IJ SOLN
500.0000 [IU]/h | INTRAMUSCULAR | Status: DC
  Administered 2018-05-18: 500 [IU]/h via INTRAVENOUS
  Filled 2018-05-18: qty 250

## 2018-05-18 MED ORDER — ASPIRIN EC 81 MG PO TBEC
81.0000 mg | DELAYED_RELEASE_TABLET | Freq: Every day | ORAL | Status: DC
  Administered 2018-05-19 – 2018-05-23 (×5): 81 mg via ORAL
  Filled 2018-05-18 (×5): qty 1

## 2018-05-18 MED ORDER — DOCUSATE SODIUM 100 MG PO CAPS
100.0000 mg | ORAL_CAPSULE | Freq: Every day | ORAL | Status: DC
  Administered 2018-05-19 – 2018-05-23 (×3): 100 mg via ORAL
  Filled 2018-05-18 (×5): qty 1

## 2018-05-18 MED ORDER — ONDANSETRON HCL 4 MG/2ML IJ SOLN
4.0000 mg | Freq: Once | INTRAMUSCULAR | Status: AC
  Administered 2018-05-18: 4 mg via INTRAVENOUS
  Filled 2018-05-18: qty 2

## 2018-05-18 MED ORDER — ACETAMINOPHEN 325 MG PO TABS: 325.0000 mg | ORAL_TABLET | ORAL | Status: DC | PRN

## 2018-05-18 MED ORDER — LACTATED RINGERS IV SOLN: INTRAVENOUS | Status: DC

## 2018-05-18 MED ORDER — PROPOFOL 10 MG/ML IV BOLUS
INTRAVENOUS | Status: DC | PRN
  Administered 2018-05-18: 30 mg via INTRAVENOUS
  Administered 2018-05-18: 150 mg via INTRAVENOUS

## 2018-05-18 MED ORDER — MIDAZOLAM HCL 5 MG/5ML IJ SOLN
INTRAMUSCULAR | Status: DC | PRN
  Administered 2018-05-18: 2 mg via INTRAVENOUS

## 2018-05-18 MED ORDER — HYDRALAZINE HCL 20 MG/ML IJ SOLN: 5.0000 mg | INTRAMUSCULAR | Status: DC | PRN

## 2018-05-18 SURGICAL SUPPLY — 100 items
BAG BANDED W/RUBBER/TAPE 36X54 (MISCELLANEOUS) ×4 IMPLANT
BAG SNAP BAND KOVER 36X36 (MISCELLANEOUS) ×4 IMPLANT
BALLN STERLING OTW 3X150X150 (BALLOONS) ×4
BALLOON STERLING OTW 3X150X150 (BALLOONS) ×2 IMPLANT
BANDAGE ACE 4X5 VEL STRL LF (GAUZE/BANDAGES/DRESSINGS) IMPLANT
BANDAGE ESMARK 6X9 LF (GAUZE/BANDAGES/DRESSINGS) IMPLANT
BLADE SURG 11 STRL SS (BLADE) IMPLANT
BNDG ESMARK 6X9 LF (GAUZE/BANDAGES/DRESSINGS)
CANISTER SUCT 3000ML PPV (MISCELLANEOUS) ×4 IMPLANT
CANNULA VESSEL 3MM 2 BLNT TIP (CANNULA) IMPLANT
CATH ANGIO 5F BER2 65CM (CATHETERS) ×4 IMPLANT
CATH EMB 2FR 60CM (CATHETERS) ×4 IMPLANT
CATH EMB 3FR 40CM (CATHETERS) ×8 IMPLANT
CATH EMB 3FR 80CM (CATHETERS) ×4 IMPLANT
CATH EMB 4FR 40CM (CATHETERS) ×4 IMPLANT
CATH EMB 4FR 80CM (CATHETERS) IMPLANT
CATH EMB 5FR 80CM (CATHETERS) IMPLANT
CATH OMNI FLUSH .035X70CM (CATHETERS) IMPLANT
CHLORAPREP W/TINT 26ML (MISCELLANEOUS) IMPLANT
CLIP VESOCCLUDE MED 24/CT (CLIP) ×4 IMPLANT
CLIP VESOCCLUDE SM WIDE 24/CT (CLIP) ×4 IMPLANT
CONT SPEC 4OZ CLIKSEAL STRL BL (MISCELLANEOUS) ×4 IMPLANT
COVER DOME SNAP 22 D (MISCELLANEOUS) ×8 IMPLANT
COVER PROBE W GEL 5X96 (DRAPES) IMPLANT
COVER SURGICAL LIGHT HANDLE (MISCELLANEOUS) ×4 IMPLANT
CUFF TOURNIQUET SINGLE 24IN (TOURNIQUET CUFF) IMPLANT
CUFF TOURNIQUET SINGLE 34IN LL (TOURNIQUET CUFF) IMPLANT
CUFF TOURNIQUET SINGLE 44IN (TOURNIQUET CUFF) IMPLANT
DERMABOND ADHESIVE PROPEN (GAUZE/BANDAGES/DRESSINGS) ×2
DERMABOND ADVANCED (GAUZE/BANDAGES/DRESSINGS) ×4
DERMABOND ADVANCED .7 DNX12 (GAUZE/BANDAGES/DRESSINGS) ×4 IMPLANT
DERMABOND ADVANCED .7 DNX6 (GAUZE/BANDAGES/DRESSINGS) ×2 IMPLANT
DEVICE TORQUE KENDALL .025-038 (MISCELLANEOUS) IMPLANT
DRAIN CHANNEL 15F RND FF W/TCR (WOUND CARE) IMPLANT
DRAPE FEMORAL ANGIO 80X135IN (DRAPES) IMPLANT
DRAPE HALF SHEET 40X57 (DRAPES) IMPLANT
DRAPE X-RAY CASS 24X20 (DRAPES) IMPLANT
DRSG COVADERM 4X8 (GAUZE/BANDAGES/DRESSINGS) ×4 IMPLANT
ELECT REM PT RETURN 9FT ADLT (ELECTROSURGICAL) ×4
ELECTRODE REM PT RTRN 9FT ADLT (ELECTROSURGICAL) ×2 IMPLANT
EVACUATOR SILICONE 100CC (DRAIN) IMPLANT
GAUZE 4X4 16PLY RFD (DISPOSABLE) ×4 IMPLANT
GAUZE SPONGE 4X4 12PLY STRL LF (GAUZE/BANDAGES/DRESSINGS) ×4 IMPLANT
GLOVE BIO SURGEON STRL SZ7.5 (GLOVE) ×8 IMPLANT
GLOVE BIOGEL M 6.5 STRL (GLOVE) ×8 IMPLANT
GLOVE BIOGEL PI IND STRL 7.5 (GLOVE) ×4 IMPLANT
GLOVE BIOGEL PI INDICATOR 7.5 (GLOVE) ×4
GOWN STRL REUS W/ TWL LRG LVL3 (GOWN DISPOSABLE) ×6 IMPLANT
GOWN STRL REUS W/ TWL XL LVL3 (GOWN DISPOSABLE) ×4 IMPLANT
GOWN STRL REUS W/TWL LRG LVL3 (GOWN DISPOSABLE) ×6
GOWN STRL REUS W/TWL XL LVL3 (GOWN DISPOSABLE) ×4
GUIDEWIRE ANGLED .035X150CM (WIRE) IMPLANT
INSERT FOGARTY SM (MISCELLANEOUS) IMPLANT
KIT BASIN OR (CUSTOM PROCEDURE TRAY) ×4 IMPLANT
KIT ENCORE 26 ADVANTAGE (KITS) ×4 IMPLANT
KIT TURNOVER KIT B (KITS) ×4 IMPLANT
MARKER GRAFT CORONARY BYPASS (MISCELLANEOUS) IMPLANT
NEEDLE PERC 18GX7CM (NEEDLE) ×4 IMPLANT
NS IRRIG 1000ML POUR BTL (IV SOLUTION) ×8 IMPLANT
PACK PERIPHERAL VASCULAR (CUSTOM PROCEDURE TRAY) ×4 IMPLANT
PACK SURGICAL SETUP 50X90 (CUSTOM PROCEDURE TRAY) ×4 IMPLANT
PAD ARMBOARD 7.5X6 YLW CONV (MISCELLANEOUS) ×8 IMPLANT
PROTECTION STATION PRESSURIZED (MISCELLANEOUS)
SET COLLECT BLD 21X3/4 12 (NEEDLE) IMPLANT
SET MICROPUNCTURE 5F STIFF (MISCELLANEOUS) IMPLANT
SHEATH AVANTI 11CM 5FR (SHEATH) ×4 IMPLANT
SHEATH BRITE TIP 7FRX11 (SHEATH) ×4 IMPLANT
STAPLER VISISTAT 35W (STAPLE) ×4 IMPLANT
STATION PROTECTION PRESSURIZED (MISCELLANEOUS) IMPLANT
STOPCOCK 4 WAY LG BORE MALE ST (IV SETS) IMPLANT
STOPCOCK MORSE 400PSI 3WAY (MISCELLANEOUS) ×4 IMPLANT
SUT ETHILON 3 0 PS 1 (SUTURE) IMPLANT
SUT GORETEX 6.0 TT13 (SUTURE) IMPLANT
SUT GORETEX 6.0 TT9 (SUTURE) IMPLANT
SUT MNCRL AB 4-0 PS2 18 (SUTURE) ×8 IMPLANT
SUT PROLENE 5 0 C 1 24 (SUTURE) ×16 IMPLANT
SUT PROLENE 6 0 BV (SUTURE) ×16 IMPLANT
SUT PROLENE 7 0 BV 1 (SUTURE) ×24 IMPLANT
SUT SILK 2 0 SH (SUTURE) IMPLANT
SUT SILK 3 0 (SUTURE)
SUT SILK 3-0 18XBRD TIE 12 (SUTURE) IMPLANT
SUT VIC AB 2-0 CT1 27 (SUTURE)
SUT VIC AB 2-0 CT1 36 (SUTURE) ×4 IMPLANT
SUT VIC AB 2-0 CT1 TAPERPNT 27 (SUTURE) IMPLANT
SUT VIC AB 3-0 SH 27 (SUTURE) ×4
SUT VIC AB 3-0 SH 27X BRD (SUTURE) ×4 IMPLANT
SYR 10ML LL (SYRINGE) ×12 IMPLANT
SYR 20CC LL (SYRINGE) ×4 IMPLANT
SYR 30ML LL (SYRINGE) ×4 IMPLANT
SYR MEDRAD MARK V 150ML (SYRINGE) IMPLANT
TAPE CLOTH SURG 4X10 WHT LF (GAUZE/BANDAGES/DRESSINGS) ×4 IMPLANT
TAPE UMBILICAL COTTON 1/8X30 (MISCELLANEOUS) IMPLANT
TOWEL GREEN STERILE (TOWEL DISPOSABLE) ×8 IMPLANT
TRAY FOLEY MTR SLVR 16FR STAT (SET/KITS/TRAYS/PACK) IMPLANT
TUBING EXTENTION W/L.L. (IV SETS) IMPLANT
TUBING HIGH PRESSURE 120CM (CONNECTOR) IMPLANT
UNDERPAD 30X30 (UNDERPADS AND DIAPERS) ×4 IMPLANT
WATER STERILE IRR 1000ML POUR (IV SOLUTION) ×4 IMPLANT
WIRE BENTSON .035X145CM (WIRE) ×4 IMPLANT
WIRE G V18X300CM (WIRE) ×4 IMPLANT

## 2018-05-18 SURGICAL SUPPLY — 11 items
CATH OMNI FLUSH 5F 65CM (CATHETERS) ×3 IMPLANT
DEVICE CONTINUOUS FLUSH (MISCELLANEOUS) ×3 IMPLANT
KIT MICROPUNCTURE NIT STIFF (SHEATH) ×3 IMPLANT
KIT PV (KITS) ×3 IMPLANT
SHEATH PINNACLE 5F 10CM (SHEATH) ×3 IMPLANT
STOPCOCK MORSE 400PSI 3WAY (MISCELLANEOUS) ×3 IMPLANT
SYRINGE MEDRAD AVANTA MACH 7 (SYRINGE) ×3 IMPLANT
TRANSDUCER W/STOPCOCK (MISCELLANEOUS) ×3 IMPLANT
TRAY PV CATH (CUSTOM PROCEDURE TRAY) ×3 IMPLANT
TUBING CIL FLEX 10 FLL-RA (TUBING) ×3 IMPLANT
WIRE BENTSON .035X145CM (WIRE) ×3 IMPLANT

## 2018-05-18 NOTE — H&P (Signed)
Hospital Consult    Reason for Consult:  Pain in left foot Referring Physician:  Dr. Rogene Houston MRN #:  [id number]  History of Present Illness: This is a 47 y.o. female without significant vascular history.  She has a 5-day history of left foot numbness specifically the first 3 toes extending up into her ankle.  Does cause her pain she has kept her awake at night.  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  She does not take any blood thinners.  She does have a family history of DVT and has 1 brother who lost a limb secondary to a blood clot.  Past Medical History:  Diagnosis Date  . Hypertension   . Sciatica     Past Surgical History:  Procedure Laterality Date  . TUBAL LIGATION      No Known Allergies  Prior to Admission medications   Medication Sig Start Date End Date Taking? Authorizing Provider  Cyclobenzaprine HCl (FLEXERIL PO) Take 10 mg by mouth 2 (two) times daily.    Yes [provider]  predniSONE (DELTASONE) 20 MG tablet Take 2 tablets (40 mg total) by mouth daily with breakfast for 5 days. Patient not taking: Reported on 05/18/2018 05/13/18 05/18/18  Zigmund Gottron, NP    Social History   Socioeconomic History  . Marital status: Single    Spouse name: Not on file  . Number of children: Not on file  . Years of education: Not on file  . Highest education level: Not on file  Occupational History  . Not on file  Social Needs  . Financial resource strain: Not on file  . Food insecurity:    Worry: Not on file    Inability: Not on file  . Transportation needs:    Medical: Not on file    Non-medical: Not on file  Tobacco Use  . Smoking status: Former Smoker    Packs/day: 1.00    Last attempt to quit: 07/02/2016    Years since quitting: 1.8  . Smokeless tobacco: Never Used  Substance and Sexual Activity  . Alcohol use: No  . Drug use: Yes    Frequency: 7.0 times per week     Types: Marijuana    Comment: smokes blunt on occasion  . Sexual activity: Not on file  Lifestyle  . Physical activity:    Days per week: Not on file    Minutes per session: Not on file  . Stress: Not on file  Relationships  . Social connections:    Talks on phone: Not on file    Gets together: Not on file    Attends religious service: Not on file    Active member of club or organization: Not on file    Attends meetings of clubs or organizations: Not on file    Relationship status: Not on file  . Intimate partner violence:    Fear of current or ex partner: Not on file    Emotionally abused: Not on file    Physically abused: Not on file    Forced sexual activity: Not on file  Other Topics Concern  . Not on file  Social History Narrative  . Not on file    Family History  Problem Relation Age of Onset  . Cancer Mother        brain and uterine  . Stroke Brother   . Diabetes Brother   .  Breast cancer Neg Hx     ROS: '[x]'$  Positive   '[ ]'$  Negative   '[ ]'$  All sytems reviewed and are negative  Cardiovascular: '[]'$  chest pain/pressure '[]'$  palpitations '[]'$  SOB lying flat '[]'$  DOE '[]'$  pain in legs while walking '[x]'$  pain in legs at rest '[]'$  pain in legs at night '[]'$  non-healing ulcers '[]'$  hx of DVT '[]'$  swelling in legs  Pulmonary: '[]'$  productive cough '[]'$  asthma/wheezing '[]'$  home O2  Neurologic: '[]'$  weakness in '[]'$  arms '[]'$  legs '[]'$  numbness in '[]'$  arms '[]'$  legs '[]'$  hx of CVA '[]'$  mini stroke '[]'$ difficulty speaking or slurred speech '[]'$  temporary loss of vision in one eye '[]'$  dizziness  Hematologic: '[]'$  hx of cancer '[]'$  bleeding problems '[]'$  problems with blood clotting easily  Endocrine:   '[]'$  diabetes '[]'$  thyroid disease  GI '[]'$  vomiting blood '[]'$  blood in stool  GU: '[]'$  CKD/renal failure '[]'$  HD--'[]'$  M/W/F or '[]'$  T/T/S '[]'$  burning with urination '[]'$  blood in urine  Psychiatric: '[]'$  anxiety '[]'$  depression  Musculoskeletal: '[]'$  arthritis '[]'$  joint pain  Integumentary: '[]'$  rashes '[]'$   ulcers  Constitutional: '[]'$  fever '[]'$  chills   Physical Examination  Vitals:   05/18/18 0938 05/18/18 1215  BP: (!) 150/107 (!) 139/99  Pulse:  62  Resp:    Temp:    SpO2:  100%   There is no height or weight on file to calculate BMI.  General:  WDWN in NAD Gait: Not observed HENT: WNL, normocephalic Pulmonary: normal non-labored breathing Cardiac: Palpable femoral pulses bilaterally I can easily palpate a popliteal pulse on the right and a week 1 on the left Palpable dorsalis pedis posterior tibial pulses on the right There are no palpable pedal pulses on the left there is a monophasic posterior tibial signal Abdomen: soft, NT/ND, no masses Extremities: Left foot mildly cool with no mottling and motor intact Musculoskeletal: no muscle wasting or atrophy  Neurologic: She has numbness to the left foot  CBC    Component Value Date/Time   WBC 9.2 05/18/2018 1042   RBC 4.93 05/18/2018 1042   HGB 13.3 05/18/2018 1250   HCT 39.0 05/18/2018 1250   PLT 288 05/18/2018 1042   MCV 79.7 05/18/2018 1042   MCH 24.3 (L) 05/18/2018 1042   MCHC 30.5 05/18/2018 1042   RDW 15.9 (H) 05/18/2018 1042   LYMPHSABS 3.6 05/18/2018 1042   MONOABS 0.6 05/18/2018 1042   EOSABS 0.2 05/18/2018 1042   BASOSABS 0.1 05/18/2018 1042    BMET    Component Value Date/Time   NA 140 05/18/2018 1250   K 3.4 (L) 05/18/2018 1250   CL 104 05/18/2018 1250   CO2 25 03/02/2008 1900   GLUCOSE 88 05/18/2018 1250   BUN 9 05/18/2018 1250   CREATININE 0.80 05/18/2018 1250   CALCIUM 8.8 03/02/2008 1900   GFRNONAA >60 03/02/2008 1900   GFRAA  03/02/2008 1900    >60        The eGFR has been calculated using the MDRD equation. This calculation has not been validated in all clinical    COAGS: No results found for: INR, PROTIME   Non-Invasive Vascular Imaging:   abi performed today per report are 0.6 left and 1.1 on the right   ASSESSMENT/PLAN: This is a 47 y.o. female with 5-day history of left  foot pain and numbness.  She has a moderately depressed ABI on the left and normal on the right with palpable pedal pulses on the right.  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  We will start with aortogram evaluation of the left lower extremity and possible intervention on the left.  I discussed with her that she may require surgery and she demonstrates good understanding.  Nate Perri C. Donzetta Matters, MD [facility] Office: [phone] Pager: [phone]

## 2018-05-18 NOTE — ED Provider Notes (Signed)
12:32 PM Patient is signed out to me for further care.  Patient with pain to her left great toe and second toe for the last 5 days.  She did have a injury to it 4 days ago but believes the pain started the day before.  She states her toes feel numb.  Initial exam by prior provider, it was noted the patient did not palpable or dopplerable DP pulses.  Posterior tibial pulses were palpated.  Patient is awaiting vascular study to rule out acute ischemia.  1:45 PM Spoke with Dr. Vella Redhead with vascular who will come by and see pt.    2:02 PM Patient back from vascular, patient's ABIs 0.64, TBI is 0.59. Dr. Vella Redhead at bedside, will at to get angio.    Jeannett Senior, PA-C 05/18/18 2216    Sherwood Gambler, MD 05/18/18 2241

## 2018-05-18 NOTE — ED Provider Notes (Signed)
[facility] EMERGENCY DEPARTMENT Provider Note   CSN: [id number] Arrival date & time: 05/18/18  2671     History   Chief Complaint Chief Complaint  Patient presents with  . Toe Pain    HPI Emma Medina is a 47 y.o. female.  The history is provided by the patient and medical records. No language interpreter was used.  Toe Pain     47 year old female presenting for evaluation of left foot injury.  Patient report for the past 2 to 3 days she has had pain and numbness sensation going down to her left leg.  States that the leg feels weak, like it is asleep.  And also numbness specifically to her second and third toe.  2 days prior to definitely slammed a car door against her left foot she endorsed throbbing pain to the dorsum of her foot radiates to her lower leg.  Pain is persistent, 9 out of 10, throbbing with cold sensation to her leg.  She mentioned been seen at urgent care several days prior for left leg pain and received a anti-inflammatory shot which helps temporarily.  She denies history of diabetes, denies fever chills or back pain.  She also denies tobacco abuse.  She mentioned a strong family history of DVT.  Past Medical History:  Diagnosis Date  . Hypertension   . Sciatica     There are no active problems to display for this patient.   Past Surgical History:  Procedure Laterality Date  . TUBAL LIGATION       OB History    Gravida  8   Para  8   Term  8   Preterm      AB      Living  8     SAB      TAB      Ectopic      Multiple      Live Births  8            Home Medications    Prior to Admission medications   Medication Sig Start Date End Date Taking? Authorizing Provider  Cyclobenzaprine HCl (FLEXERIL PO) Take by mouth.    [provider]  predniSONE (DELTASONE) 20 MG tablet Take 2 tablets (40 mg total) by mouth daily with breakfast for 5 days. 05/13/18 05/18/18  Zigmund Gottron, NP    Family  History Family History  Problem Relation Age of Onset  . Cancer Mother        brain and uterine  . Stroke Brother   . Diabetes Brother   . Breast cancer Neg Hx     Social History Social History   Tobacco Use  . Smoking status: Former Smoker    Packs/day: 1.00    Last attempt to quit: 07/02/2016    Years since quitting: 1.8  . Smokeless tobacco: Never Used  Substance Use Topics  . Alcohol use: No  . Drug use: Yes    Frequency: 7.0 times per week    Types: Marijuana    Comment: smokes blunt on occasion     Allergies   Patient has no known allergies.   Review of Systems Review of Systems  All other systems reviewed and are negative.    Physical Exam Updated Vital Signs BP (!) 150/107 (BP Location: Right Arm) Comment: history of hypertension, has not taken medications today  Pulse 86   Temp 98.3 F (36.8 C) (Oral)   Resp 16   LMP  04/30/2018 (Approximate)   SpO2 99%   Physical Exam  Constitutional: She appears well-developed and well-nourished. No distress.  HENT:  Head: Atraumatic.  Eyes: Conjunctivae are normal.  Neck: Neck supple.  Cardiovascular: Normal rate and regular rhythm.  Pulmonary/Chest: Effort normal and breath sounds normal.  Musculoskeletal: She exhibits tenderness (Tenderness throughout left foot, ankle and lower leg on palpation without focal point tenderness.  Diminished dorsalis pedis pulse but intact Emma Medina pulse with sluggish cap refills to toes.  Left foot is colder than right.).  No lumbar spine tenderness.  Neurological: She is alert.  Patellar reflexes intact.  Skin: No rash noted.  Psychiatric: She has a normal mood and affect.  Nursing note and vitals reviewed.    ED Treatments / Results  Labs (all labs ordered are listed, but only abnormal results are displayed) Labs Reviewed  CBC WITH DIFFERENTIAL/PLATELET  I-STAT CHEM 8, ED  I-STAT CG4 LACTIC ACID, ED    EKG None  Radiology Dg Foot Complete Left  Result Date:  05/18/2018 CLINICAL DATA:  Closed car door on foot.  Initial encounter. EXAM: LEFT FOOT - COMPLETE 3+ VIEW COMPARISON:  Fifth digit series from 09/23/2014 FINDINGS: There is no evidence of fracture or dislocation. There is no evidence of arthropathy or other focal bone abnormality. Soft tissues are unremarkable. IMPRESSION: Negative. Electronically Signed   By: Monte Fantasia M.D.   On: 05/18/2018 10:21    Procedures Procedures (including critical care time)  Medications Ordered in ED Medications  sodium chloride 0.9 % bolus 500 mL (500 mLs Intravenous New Bag/Given 05/18/18 1237)  morphine 4 MG/ML injection 4 mg (4 mg Intravenous Given 05/18/18 1237)  ondansetron (ZOFRAN) injection 4 mg (4 mg Intravenous Given 05/18/18 1238)     Initial Impression / Assessment and Plan / ED Course  I have reviewed the triage vital signs and the nursing notes.  Pertinent labs & imaging results that were available during my care of the patient were reviewed by me and considered in my medical decision making (see chart for details).     BP (!) 139/99   Pulse 62   Temp 98.3 F (36.8 C) (Oral)   Resp 16   LMP 04/30/2018 (Approximate)   SpO2 100%    Final Clinical Impressions(s) / ED Diagnoses   Final diagnoses:  Left leg claudication [facility])    ED Discharge Orders    None     10:44 AM Patient here with left leg pain for the past several days as well as pain to the left left foot with recent foot injury.  X-ray of the left foot is unremarkable however she does have diminished on nonpalpable dorsalis pedis pulse with intact Posterior Tibialis pulse on my initial exam.  No significant calf tenderness or peripheral edema appreciated on exam.  Concern for claudication causing partial ischemic foot. I discussed care with Dr. Rogene Houston.  11:10 AM Dr. Rogene Houston also evaluated Emma Medina, unable to appreciate DP pulse with bedside doppler US.  Will obtain ABI.    12:47 PM Emma Medina signed out to [employer],  PA-C who will continue with evaluation and management.     Domenic Moras, PA-C 05/18/18 1248    Fredia Sorrow, MD 05/24/18 [phone]

## 2018-05-18 NOTE — ED Provider Notes (Signed)
Patient seen by me along with the physician assistant.  Medical screening examination/treatment/procedure(s) were conducted as a shared visit with non-physician practitioner(s) and myself.  I personally evaluated the patient during the encounter.  None   Patient presenting with left foot coolness and some pain and some purplish toes for the past several days.  Also has some pain in the anterior part of the leg with walking.  Patient on my examination has cap refill although the toes are purplish in color.  No Doppler pulse to the dorsalis pedis.  But has Doppler pulse to the posterior tib.    May very well have some ischemia to the foot.  But it is a total ischemic foot.  We will go ahead and get ABIs.  And then now may very well talk to vascular surgery after that.  Is also possible patient may have some autoregulation issues and that is why the toes are blue because we do have good cap refill and will be released to have Doppler pulse on the posterior tib it.  At the patient's right foot has a palpable dorsalis pedis pulse and Doppler pulses to both dorsalis pedis and posterior tib.  The right foot is pink and warm.  Left foot is slightly cool.   Fredia Sorrow, MD 05/18/18 [phone]

## 2018-05-18 NOTE — Anesthesia Preprocedure Evaluation (Addendum)
Anesthesia Evaluation  Patient identified by MRN, date of birth, ID band Patient awake    Reviewed: Allergy & Precautions, NPO status , Patient's Chart, lab work & pertinent test results  Airway Mallampati: II  TM Distance: >3 FB Neck ROM: Full    Dental  (+) Edentulous Upper, Dental Advisory Given, Missing,    Pulmonary former smoker,    breath sounds clear to auscultation       Cardiovascular hypertension,  Rhythm:Regular Rate:Normal     Neuro/Psych    GI/Hepatic negative GI ROS, Neg liver ROS,   Endo/Other  negative endocrine ROS  Renal/GU negative Renal ROS     Musculoskeletal negative musculoskeletal ROS (+)   Abdominal (+) + obese,   Peds  Hematology negative hematology ROS (+)   Anesthesia Other Findings   Reproductive/Obstetrics                            Anesthesia Physical Anesthesia Plan  ASA: II and emergent  Anesthesia Plan: General   Post-op Pain Management:    Induction: Intravenous  PONV Risk Score and Plan: 4 or greater and Ondansetron, Dexamethasone, Midazolam and Scopolamine patch - Pre-op  Airway Management Planned: Oral ETT  Additional Equipment: None  Intra-op Plan:   Post-operative Plan: Extubation in OR  Informed Consent: I have reviewed the patients History and Physical, chart, labs and discussed the procedure including the risks, benefits and alternatives for the proposed anesthesia with the patient or authorized representative who has indicated his/her understanding and acceptance.   Dental advisory given  Plan Discussed with: CRNA  Anesthesia Plan Comments: (Will get T&S in room if necessary.)       Anesthesia Quick Evaluation

## 2018-05-18 NOTE — Progress Notes (Signed)
ABI completed. Prelim:  Right ABI 1.18 Right TBI 0.86  Left ABI 0.64 Left TBI 0.59  Left ATA/DPA absent, ABI obtained with PTA. Left TBI may be inaccurate as great toe PPG waveform is dampened.  Landry Mellow, RDMS, RVT

## 2018-05-18 NOTE — ED Triage Notes (Signed)
Patient complains of pain in her left foot since Saturday after she closed on car door on her left foot. Patient alert, oriented, and ambulating independently with steady gait.

## 2018-05-18 NOTE — Op Note (Signed)
Patient name: Emma Medina MRN: [id number] DOB: 1970/11/08 Sex: female  05/18/2018 Pre-operative Diagnosis: Acute left lower extremity ischemia Post-operative diagnosis:  Same Surgeon:  Erlene Quan C. Donzetta Matters, MD Assistant: Leontine Locket, PA Procedure Performed: 1.  Exposure of left common femoral artery, left popliteal artery, left anterior tibial artery and left posterior tibial artery 2.  Aortogram with left lower extremity angiogram 3.  Left lower extremity thromboembolectomy from femoral, popliteal, posterior tibial, and anterior tibial approaches 4.  Balloon angioplasty left posterior tibial artery with 3 x 150 mm balloon 5.  Minx device closure right common femoral artery  Indications: 47 year old female presented today with acute left lower extremity ischemia that is been present for 5 days with numbness and pain.  She is undergone angiogram which demonstrated possible thrombus existing her left common iliac artery as well as occlusion of her trifurcation below the knee.  She was subsequently indicated for left lower extremity intervention via open approach.  Findings: There was no discernible flow-limiting thrombus in the left common iliac artery on angiogram.  The popliteal artery had an overlying muscle with concern for popliteal entrapment but there is no inherent disease in the artery itself.  There was significant clot removed from the anterior tibial artery from the popliteal approach as well as from the exposure of the anterior tibial artery itself at the ankle.  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  I completion her dominant runoff into her foot is via her posterior tibial artery filling her arch but her anterior tibial artery appears to terminate at the takeoff of the dorsalis pedis proximally.  She has a strong  dorsalis pedis and posterior tibial signal at the foot level.  As there was both acute appearing thrombus as well as chronic appearing occlusive disease in the tibioperoneal trunk we will heparinize her and work her up for source of embolus.   Procedure:  The patient was identified in the holding area and taken to the operating room where she is placed supine on the operative table and general anesthesia was induced.  She was sterilely prepped and draped in the left lower extremity as well as the right groin where she had an existing sheath she was given antibiotics and a timeout was called.  We began with transverse incision at the level of the palpable common femoral pulse just above the groin crease.  We dissected down to expose the common femoral artery and placed a vessel loop around this.  We then turned our attention to below the knee we made a longitudinal incision and dissected down deep through the deep fascia to expose our popliteal artery.  We first identified our popliteal nerve and then vein retracted this laterally.  There was an overlying muscle that was divided over all of these nerve and the neurovascular bundle.  The artery was quite lateral and the leg was difficult to get to.  We divided the anterior tibial vein but could not identify the anterior tibial artery despite dissecting down further we eventually decided to heparinize to perform embolectomy.  She was given a total of 11,000 of heparin and was redosed twice throughout this case.  We first made a transverse incision after clamping the common femoral artery passed a 4 Fogarty proximally x2 but did not return any thrombus and reclamped the artery and closed it.  Then  turned our attention distally we made a transverse incision of the popliteal artery had very strong inflow and this was clamped.  There is minimal backbleeding.  We passed the Fogarty approximately 10 times returning significant clot until we had no return.  After we did  this we then cannulated the common femoral artery with a 18-gauge needle followed by wire and placed a 5 French sheath.  We performed angiogram up into the aorta did not demonstrate any discernible thrombus that we thought we saw on previous angiogram earlier in the day.  We then performed left lower extremity angiogram and the anterior tibial artery was now open to the level of the ankle although there was significant spasm.  The tibioperoneal trunk appeared to be occluded at the takeoff.  I then cut down at the level of the ankle from the posterior tibial artery and identified this.  We clamped distally opened it longitudinally we did have some inflow to suggest that maybe there were collaterals around the occlusive disease.  I then passed the Fogarty but could not get it to cross any disease segment.  With this I cut down the anterior tibial artery and expose this.  This was obviously thrombosed.  We made a transverse arteriotomy had good inflow did pass the Fogarty for a short segment cephalad but did not return any clot.  Distally we passed that returned significant amounts of clot for 2 passes until it was free.  We then flushed with heparinized saline and closed with 7-0 Prolene.  We then turned our attention back to the posterior tibial artery.  Given that this appeared to be chronically occluded at the tibioperoneal trunk I was able to get a Bentson wire and bare catheter up to the level of the occlusion and then crossed it with a V 18 wire.  I then performed balloon angioplasty with a 3 mm balloon for 2 minutes.  After this was let down we remove the balloon performed left lower extremity angiogram which now demonstrated in-line flow via the posterior tibial filling the arch in the foot and there is no dissection or stenosis were previously was occluded in the posterior tibial artery.  Anterior tibial now ran off in line to the dorsalis pedis but appears to give out before feeling any of the foot arteries.   With that we elected to terminate our procedure.  We did not give protamine.  We irrigated all of her wounds and obtained hemostasis.  The groin was closed in multiple layers with Vicryl and Monocryl and Dermabond was placed in the skin.  The below-knee popliteal exposure incision was closed with a running 2-0 Vicryl deep followed by staples at the skin.  The 2 ankle incisions were closed with staples alone.  A minx device was deployed in the right common femoral artery sheath this was removed.  There was a palpable pulse in the right dorsalis pedis at completion.  There were strong signals in the dorsalis pedis and posterior tibial beyond our incisions on the left side.  All counts were correct at completion.  She tolerated this procedure well without immediate complication.  Specimen: Thrombus  Contrast: 65 cc  Estimated blood loss: 500 cc.   Brandon C. Donzetta Matters, MD [facility] Office: [phone] Pager: [phone]

## 2018-05-18 NOTE — Op Note (Signed)
    Patient name: Emma Medina MRN: [id number] DOB: 1971/06/30 Sex: female  05/18/2018 Pre-operative Diagnosis: Acute left lower extremity ischemia Post-operative diagnosis:  Same Surgeon:  Erlene Quan C. Donzetta Matters, MD Procedure Performed: 1.  Ultrasound-guided cannulation right common femoral artery 2.  Aortogram bilateral lower extremity runoff  Indications: 47 year old female presents with 5-day history of left lower extremity numbness and pain.  She has a moderately depressed ABI on the left is indicated for angiogram with possible intervention.  Findings: The aorta and right common and external iliac arteries are free of disease.  The left common iliac artery appears to have mobile thrombus but the external iliac artery is free of any disease.  Bilateral SFAs popliteal arteries are patent until on the left there is a flush occlusion at the level of the anterior tibial artery.  She reconstitutes posterior tibial and peroneal arteries and has islands of anterior tibial artery posterior tibial is the dominant to the left foot.  On the right side she has no underlying arterial disease with flow directly to the right foot in line.   Procedure:  The patient was identified in the holding area and taken to room 8.  The patient was then placed supine on the table and prepped and draped in the usual sterile fashion.  A time out was called.  Ultrasound was used to evaluate the common femoral artery this was noted to be free of disease image was saved the permanent record.  The area was anesthetized with 1% lidocaine.  We cannulated directly with micropuncture needle placed a wire sheath.  I then placed a 5 French sheath over a Bentson wire and placed the Omni Flush catheter in the aorta.  We performed aortogram with bilateral lower extremity runoff with the above findings.  With this we will plan for operative intervention given the amount of clot.  I discussed with with her she agrees to proceed.    Contrast:  120 cc.    Leeah Politano C. Donzetta Matters, MD [facility] Office: [phone] Pager: [phone]

## 2018-05-18 NOTE — Transfer of Care (Signed)
Immediate Anesthesia Transfer of Care Note  Patient: Emma Medina  Procedure(s) Performed: LEFT LEG THROMBECTOMY, BALLOON ANGIOPLASTY LEFT POSTERIOR TIBIAL ARTERY (Left ) AORTAGRAM WITH LEFT LEG RUNOFF, MYNX CLOSURE DEVICE PLACED IN RIGHT FEMORAL ARTERY  Patient Location: PACU  Anesthesia Type:General  Level of Consciousness: awake, alert , oriented and patient cooperative  Airway & Oxygen Therapy: Patient Spontanous Breathing  Post-op Assessment: Report given to RN and Post -op Vital signs reviewed and stable  Post vital signs: Reviewed and stable  Last Vitals:  Vitals Value Taken Time  BP    Temp    Pulse 94 05/18/2018  8:58 PM  Resp 18 05/18/2018  8:58 PM  SpO2 97 % 05/18/2018  8:58 PM  Vitals shown include unvalidated device data.  Last Pain:  Vitals:   05/18/18 1511  TempSrc:   PainSc: 10-Worst pain ever         Complications: No apparent anesthesia complications

## 2018-05-19 ENCOUNTER — Encounter (HOSPITAL_COMMUNITY): Payer: Self-pay | Admitting: Vascular Surgery

## 2018-05-19 ENCOUNTER — Inpatient Hospital Stay (HOSPITAL_COMMUNITY): Payer: Self-pay

## 2018-05-19 DIAGNOSIS — I5030 Unspecified diastolic (congestive) heart failure: Secondary | ICD-10-CM

## 2018-05-19 DIAGNOSIS — Z9889 Other specified postprocedural states: Secondary | ICD-10-CM

## 2018-05-19 LAB — CBC
HCT: 33.3 % — ABNORMAL LOW (ref 36.0–46.0)
HEMOGLOBIN: 10.3 g/dL — AB (ref 12.0–15.0)
MCH: 24.3 pg — AB (ref 26.0–34.0)
MCHC: 30.9 g/dL (ref 30.0–36.0)
MCV: 78.7 fL (ref 78.0–100.0)
Platelets: 282 10*3/uL (ref 150–400)
RBC: 4.23 MIL/uL (ref 3.87–5.11)
RDW: 15.9 % — ABNORMAL HIGH (ref 11.5–15.5)
WBC: 12.1 10*3/uL — AB (ref 4.0–10.5)

## 2018-05-19 LAB — ECHOCARDIOGRAM COMPLETE
HEIGHTINCHES: 66 in
WEIGHTICAEL: 3888 [oz_av]

## 2018-05-19 LAB — BASIC METABOLIC PANEL
ANION GAP: 8 (ref 5–15)
BUN: 6 mg/dL (ref 6–20)
CHLORIDE: 106 mmol/L (ref 98–111)
CO2: 23 mmol/L (ref 22–32)
Calcium: 8.2 mg/dL — ABNORMAL LOW (ref 8.9–10.3)
Creatinine, Ser: 0.81 mg/dL (ref 0.44–1.00)
GFR calc non Af Amer: >60 mL/min (ref >60)
Glucose, Bld: 201 mg/dL — ABNORMAL HIGH (ref 70–99)
POTASSIUM: 4.2 mmol/L (ref 3.5–5.1)
SODIUM: 137 mmol/L (ref 135–145)

## 2018-05-19 LAB — HEPARIN LEVEL (UNFRACTIONATED): Heparin Unfractionated: 0.19 IU/mL — ABNORMAL LOW (ref 0.30–0.70)

## 2018-05-19 MED ORDER — ROSUVASTATIN CALCIUM 10 MG PO TABS
10.0000 mg | ORAL_TABLET | Freq: Every day | ORAL | Status: DC
  Administered 2018-05-19 – 2018-05-23 (×4): 10 mg via ORAL
  Filled 2018-05-19 (×5): qty 1

## 2018-05-19 MED ORDER — HEPARIN (PORCINE) IN NACL 100-0.45 UNIT/ML-% IJ SOLN
1350.0000 [IU]/h | INTRAMUSCULAR | Status: DC
  Administered 2018-05-20: 1300 [IU]/h via INTRAVENOUS
  Administered 2018-05-21: 1350 [IU]/h via INTRAVENOUS
  Administered 2018-05-21: 1300 [IU]/h via INTRAVENOUS
  Administered 2018-05-22: 1350 [IU]/h via INTRAVENOUS
  Filled 2018-05-19 (×4): qty 250

## 2018-05-19 NOTE — Evaluation (Addendum)
Physical Therapy Evaluation Patient Details Name: Emma Medina MRN: [id number] DOB: 20-Jun-1971 Today's Date: 05/19/2018   History of Present Illness  Pt is a 47 y.o. female admitted 05/18/18 with c/o L foot numbness. Now s/p LLE embolectomy and baloon angioplasty of posterior tibial artery. PMH includes sciatica, HTN.    Clinical Impression  Pt presents with an overall decrease in functional mobility secondary to above. PTA, pt indep and lives alone; will have 24/7 support from family at d/c. Educ on precautions, positioning, therex, and importance of mobility. Today, pt able to amb with RW at supervision-level. Encouraged continued amb with supervision from family/nursing staff. Overally, pt moving well despite decreased sensation throughout L foot. If pt remains admitted, will plan for stair training next session. Pt would benefit from continued acute PT services to maximize functional mobility and independence prior to return home.     Follow Up Recommendations No PT follow up    Equipment Recommendations  Rolling walker with 5" wheels;3in1 (PT)    Recommendations for Other Services       Precautions / Restrictions Precautions Precautions: Fall Restrictions Weight Bearing Restrictions: No      Mobility  Bed Mobility Overal bed mobility: Independent                Transfers Overall transfer level: Modified independent Equipment used: Rolling walker (2 wheeled) Transfers: Sit to/from Stand              Ambulation/Gait Ambulation/Gait assistance: Supervision Gait Distance (Feet): 40 Feet Assistive device: Rolling walker (2 wheeled) Gait Pattern/deviations: Step-to pattern;Decreased weight shift to left;Antalgic Gait velocity: Decreased Gait velocity interpretation: <1.8 ft/sec, indicate of risk for recurrent falls General Gait Details: Slow, antalgic amb with RW and supervision; cues for correct sequencing with RW. Pt with decreased weight shift onto  painful/numb L foot; encouraged WBAT. By end of amb, utilizing hop-to gait pattern on R foot  Stairs Stairs: (Did not perform due to fatigue/pain. Educ on correct technique with painful LLE)          Wheelchair Mobility    Modified Rankin (Stroke Patients Only)       Balance Overall balance assessment: Needs assistance   Sitting balance-Leahy Scale: Good Sitting balance - Comments: Difficulty reaching L foot due to painful groin incision     Standing balance-Leahy Scale: Fair Standing balance comment: Can static stand with no UE support; indep with pericare with single UE support while standing                             Pertinent Vitals/Pain Pain Assessment: Faces Faces Pain Scale: Hurts little more Pain Location: L foot incision Pain Descriptors / Indicators: Sore;Guarding Pain Intervention(s): Monitored during session;Repositioned    Home Living Family/patient expects to be discharged to:: Private residence Living Arrangements: Alone Available Help at Discharge: Family;Available 24 hours/day Type of Home: House Home Access: Stairs to enter Entrance Stairs-Rails: Right Entrance Stairs-Number of Steps: 5 Home Layout: One level   Additional Comments: Children plan to provide initial 24/7 support    Prior Function Level of Independence: Independent         Comments: Works as Midwife        Extremity/Trunk Assessment   Upper Extremity Assessment Upper Extremity Assessment: Overall WFL for tasks assessed    Lower Extremity Assessment Lower Extremity Assessment: LLE deficits/detail LLE Deficits / Details: s/p LLE embolectomy; hip  flex limited by painful groin incision, at least 3/5 functional strength LLE: Unable to fully assess due to pain       Communication   Communication: No difficulties  Cognition Arousal/Alertness: Awake/alert Behavior During Therapy: WFL for tasks assessed/performed Overall  Cognitive Status: Within Functional Limits for tasks assessed                                        General Comments General comments (skin integrity, edema, etc.): Son present during session    Exercises Other Exercises Other Exercises: Encouraged regular LE ROM, including hip flex, knee flex/ext, ankle pumps   Assessment/Plan    PT Assessment Patient needs continued PT services  PT Problem List Decreased strength;Decreased range of motion;Decreased activity tolerance;Decreased balance;Decreased mobility;Decreased knowledge of use of DME;Pain       PT Treatment Interventions DME instruction;Gait training;Stair training;Functional mobility training;Therapeutic activities;Therapeutic exercise;Balance training;Patient/family education    PT Goals (Current goals can be found in the Care Plan section)  Acute Rehab PT Goals Patient Stated Goal: Get feeling back in left foot PT Goal Formulation: With patient Time For Goal Achievement: 06/02/18 Potential to Achieve Goals: Good    Frequency Min 3X/week   Barriers to discharge        Co-evaluation               AM-PAC PT "6 Clicks" Daily Activity  Outcome Measure Difficulty turning over in bed (including adjusting bedclothes, sheets and blankets)?: None Difficulty moving from lying on back to sitting on the side of the bed? : None Difficulty sitting down on and standing up from a chair with arms (e.g., wheelchair, bedside commode, etc,.)?: None Help needed moving to and from a bed to chair (including a wheelchair)?: A Little Help needed walking in hospital room?: A Little Help needed climbing 3-5 steps with a railing? : A Little 6 Click Score: 21    End of Session Equipment Utilized During Treatment: Gait belt Activity Tolerance: Patient tolerated treatment well Patient left: in chair;with call bell/phone within reach;with family/visitor present Nurse Communication: Mobility status PT Visit Diagnosis:  Other abnormalities of gait and mobility (R26.89);Pain Pain - Right/Left: Left Pain - part of body: Leg    Time: 5102-5852 PT Time Calculation (min) (ACUTE ONLY): 26 min   Charges:   PT Evaluation $PT Eval Low Complexity: 1 Low PT Treatments $Gait Training: 8-22 mins       Mabeline Caras, PT, DPT Acute Rehab Services  Pager: [place] 05/19/2018, 9:23 AM

## 2018-05-19 NOTE — Anesthesia Postprocedure Evaluation (Signed)
Anesthesia Post Note  Patient: Emma Medina  Procedure(s) Performed: LEFT LEG THROMBECTOMY, BALLOON ANGIOPLASTY LEFT POSTERIOR TIBIAL ARTERY (Left ) AORTAGRAM WITH LEFT LEG RUNOFF, MYNX CLOSURE DEVICE PLACED IN RIGHT FEMORAL ARTERY     Patient location during evaluation: PACU Anesthesia Type: General Level of consciousness: awake and alert Pain management: pain level controlled Vital Signs Assessment: post-procedure vital signs reviewed and stable Respiratory status: spontaneous breathing, nonlabored ventilation, respiratory function stable and patient connected to nasal cannula oxygen Cardiovascular status: blood pressure returned to baseline and stable Postop Assessment: no apparent nausea or vomiting Anesthetic complications: no    Last Vitals:  Vitals:   05/19/18 0027 05/19/18 0415  BP: 124/81 (!) 137/96  Pulse: 94 67  Resp: (!) 32 15  Temp: 36.4 C 36.8 C  SpO2: 99% 99%    Last Pain:  Vitals:   05/19/18 0415  TempSrc: Oral  PainSc:                  Stacie Templin

## 2018-05-19 NOTE — Progress Notes (Signed)
ABI's have been completed. Right 1.13 Left 1.05  05/19/18 3:22 PM Carlos Levering RVT

## 2018-05-19 NOTE — Progress Notes (Signed)
  Echocardiogram 2D Echocardiogram has been performed.  Emma Medina 05/19/2018, 10:59 AM

## 2018-05-19 NOTE — Evaluation (Signed)
Occupational Therapy Evaluation Patient Details Name: Emma Medina MRN: [id number] DOB: [birth year] Today's Date: 05/19/2018    History of Present Illness Pt is a 47 y.o. female admitted 05/18/18 with c/o L foot numbness. Now s/p LLE embolectomy and baloon angioplasty of posterior tibial artery. PMH includes sciatica, HTN.   Clinical Impression   Pt with decline in function an safety with ADLs and ADL mobility with decreased balance, endurance and pain. Pt would benefit from acute OT services to address impairments to maximize level of function and safety.     Follow Up Recommendations  No OT follow up;Supervision - Intermittent    Equipment Recommendations  Other (comment)(reacher, LH sponge)    Recommendations for Other Services       Precautions / Restrictions Precautions Precautions: Fall Restrictions Weight Bearing Restrictions: No      Mobility Bed Mobility Overal bed mobility: Independent                Transfers Overall transfer level: Modified independent Equipment used: Rolling walker (2 wheeled) Transfers: Sit to/from Stand                Balance Overall balance assessment: Needs assistance   Sitting balance-Leahy Scale: Good Sitting balance - Comments: Difficulty reaching L foot due to painful groin incision     Standing balance-Leahy Scale: Fair                             ADL either performed or assessed with clinical judgement   ADL Overall ADL's : Needs assistance/impaired Eating/Feeding: Independent;Sitting   Grooming: Wash/dry hands;Wash/dry face;Supervision/safety   Upper Body Bathing: Set up;Modified independent   Lower Body Bathing: Minimal assistance Lower Body Bathing Details (indicate cue type and reason): due to incisional pain Upper Body Dressing : Modified independent;Set up   Lower Body Dressing: Minimal assistance Lower Body Dressing Details (indicate cue type and reason): due to incisional  pain Toilet Transfer: RW;Ambulation;Supervision/safety   Toileting- Clothing Manipulation and Hygiene: Sit to/from stand;Supervision/safety       Functional mobility during ADLs: Rolling walker;Supervision/safety General ADL Comments: initiated ADL A/E education     Vision Patient Visual Report: No change from baseline       Perception     Praxis      Pertinent Vitals/Pain Pain Assessment: 0-10 Pain Score: 6  Pain Location: L foot incision Pain Descriptors / Indicators: Sore;Guarding Pain Intervention(s): Monitored during session;Premedicated before session;Repositioned     Hand Dominance Right   Extremity/Trunk Assessment Upper Extremity Assessment Upper Extremity Assessment: Overall WFL for tasks assessed   Lower Extremity Assessment Lower Extremity Assessment: Defer to PT evaluation   Cervical / Trunk Assessment Cervical / Trunk Assessment: Normal   Communication Communication Communication: No difficulties   Cognition Arousal/Alertness: Awake/alert Behavior During Therapy: WFL for tasks assessed/performed Overall Cognitive Status: Within Functional Limits for tasks assessed                                     General Comments       Exercises     Shoulder Instructions      Home Living Family/patient expects to be discharged to:: Private residence Living Arrangements: Alone Available Help at Discharge: Family;Available 24 hours/day Type of Home: House Home Access: Stairs to enter [employer] of Steps: 5 Entrance Stairs-Rails: Right Home Layout: One level  Bathroom Shower/Tub: Occupational psychologist: Standard         Additional Comments: Children plan to provide initial 24/7 support      Prior Functioning/Environment Level of Independence: Independent        Comments: Works as Chief Strategy Officer        OT Problem List: Decreased activity tolerance;Decreased knowledge of use of DME or  AE;Increased edema;Impaired balance (sitting and/or standing);Pain      OT Treatment/Interventions: Self-care/ADL training;DME and/or AE instruction;Therapeutic activities;Patient/family education    OT Goals(Current goals can be found in the care plan section) Acute Rehab OT Goals Patient Stated Goal: Get feeling back in left foot OT Goal Formulation: With patient Time For Goal Achievement: 06/02/18 Potential to Achieve Goals: Good ADL Goals Pt Will Perform Lower Body Bathing: with min guard assist;with supervision;with set-up;with caregiver independent in assisting;with adaptive equipment Pt Will Perform Lower Body Dressing: with min guard assist;with supervision;with set-up;with adaptive equipment;with caregiver independent in assisting Pt Will Transfer to Toilet: with modified independence;ambulating Pt Will Perform Toileting - Clothing Manipulation and hygiene: with modified independence;sit to/from stand  OT Frequency: Min 2X/week   Barriers to D/C:    no barriers       Co-evaluation              AM-PAC PT "6 Clicks" Daily Activity     Outcome Measure Help from another person eating meals?: None Help from another person taking care of personal grooming?: None Help from another person toileting, which includes using toliet, bedpan, or urinal?: None Help from another person bathing (including washing, rinsing, drying)?: A Little Help from another person to put on and taking off regular upper body clothing?: None Help from another person to put on and taking off regular lower body clothing?: A Little 6 Click Score: 22   End of Session Equipment Utilized During Treatment: Rolling walker  Activity Tolerance: Patient tolerated treatment well Patient left: in bed;with call bell/phone within reach;with family/visitor present  OT Visit Diagnosis: Other abnormalities of gait and mobility (R26.89);Pain Pain - Right/Left: Left Pain - part of body: Ankle and joints of foot;Leg                 Time: 5056-9794 OT Time Calculation (min): 22 min Charges:  OT General Charges $OT Visit: 1 Visit OT Evaluation $OT Eval Low Complexity: 1 Low    Britt Bottom 05/19/2018, 1:43 PM

## 2018-05-19 NOTE — Progress Notes (Signed)
OT Cancellation Note  Patient Details Name: Emma Medina MRN: [id number] DOB: 01/24/1971   Cancelled Treatment:    Reason Eval/Treat Not Completed: Patient at procedure or test/ unavailable. OT Will check back later  Britt Bottom 05/19/2018, 9:30 AM

## 2018-05-19 NOTE — Progress Notes (Signed)
Rough and Ready for Heparin  Indication: Acute limb ischemia, s/p OR  No Known Allergies  Patient Measurements: Height: 5\' 6"  (167.6 cm) Weight: 243 lb (110.2 kg) IBW/kg (Calculated) : 59.3  Vital Signs:    Labs: Recent Labs    05/18/18 1042 05/18/18 1250 05/19/18 0405 05/19/18 1439  HGB 12.0 13.3 10.3*  --   HCT 39.3 39.0 33.3*  --   PLT 288  --  282  --   HEPARINUNFRC  --   --   --  0.19*  CREATININE  --  0.80 0.81  --     Estimated Creatinine Clearance: 105.7 mL/min (by C-G formula based on SCr of 0.81 mg/dL).   Assessment: 47 y/o F with acute lower extremity ischemia s/p OR, was on heparin 500 units/hr overnight, now with plans to transition to full dose anti-coagulation.   Heparin level is subtherapeutic at 1000 units/hr. No bleeding noted.  Goal of Therapy:  Heparin level 0.3-0.7 units/ml Monitor platelets by anticoagulation protocol: Yes   Plan:  -No bolus post-op -Increase heparin drip to 1300 units/hr -6 hr heparin level -Daily heparin level and CBC -Monitor for s/sx of bleeding   Renold Genta, PharmD, BCPS Clinical Pharmacist Clinical phone for 05/19/2018 until 10p is [phone] Please check AMION for all Pharmacist numbers by unit 05/19/2018 4:16 PM

## 2018-05-19 NOTE — Progress Notes (Signed)
  Progress Note    05/19/2018 7:10 AM 1 Day Post-Op  Subjective: Still has some numbness in her first 4 toes on the left  Vitals:   05/19/18 0027 05/19/18 0415  BP: 124/81 (!) 137/96  Pulse: 94 67  Resp: (!) 32 15  Temp: 97.6 F (36.4 C) 98.2 F (36.8 C)  SpO2: 99% 99%    Physical Exam: Awake alert oriented Abdomen is soft Groin incision clean dry intact Below the knee incisions with staples with minimal oozing There is a palpable dorsalis pedis pulse proximally in the foot Posterior tibial signal to the level of the ankle Toes are warm and motor intact without evidence of ischemia  CBC    Component Value Date/Time   WBC 12.1 (H) 05/19/2018 0405   RBC 4.23 05/19/2018 0405   HGB 10.3 (L) 05/19/2018 0405   HCT 33.3 (L) 05/19/2018 0405   PLT 282 05/19/2018 0405   MCV 78.7 05/19/2018 0405   MCH 24.3 (L) 05/19/2018 0405   MCHC 30.9 05/19/2018 0405   RDW 15.9 (H) 05/19/2018 0405   LYMPHSABS 3.6 05/18/2018 1042   MONOABS 0.6 05/18/2018 1042   EOSABS 0.2 05/18/2018 1042   BASOSABS 0.1 05/18/2018 1042    BMET    Component Value Date/Time   NA 137 05/19/2018 0405   K 4.2 05/19/2018 0405   CL 106 05/19/2018 0405   CO2 23 05/19/2018 0405   GLUCOSE 201 (H) 05/19/2018 0405   BUN 6 05/19/2018 0405   CREATININE 0.81 05/19/2018 0405   CALCIUM 8.2 (L) 05/19/2018 0405   GFRNONAA >60 05/19/2018 0405   GFRAA >60 05/19/2018 0405    INR No results found for: INR   Intake/Output Summary (Last 24 hours) at 05/19/2018 0710 Last data filed at 05/19/2018 0443 Gross per 24 hour  Intake 2518.03 ml  Output 1700 ml  Net 818.03 ml     Assessment:  47 y.o. female is s/p left lower extremity embolectomy as well as balloon angioplasty of a posterior tibial artery  Plan: Transition to full dose anticoagulation Echo for evaluation of cardiac embolus as source Will need aspirin and a statin Out of bed as tolerated She will need a CT Angio of her chest abdomen and pelvis but  this may be better served as an outpatient given her recent contrast load and she will be anticoagulated anyway.   Maria Coin C. Donzetta Matters, MD [facility] [place] Office: [phone] Pager: [phone]  05/19/2018 7:10 AM

## 2018-05-19 NOTE — Progress Notes (Signed)
ANTICOAGULATION CONSULT NOTE - Initial Consult  Pharmacy Consult for Heparin  Indication: Acute limb ischemia, s/p OR  No Known Allergies  Patient Measurements: Height: 5\' 6"  (167.6 cm) Weight: 243 lb (110.2 kg) IBW/kg (Calculated) : 59.3  Vital Signs: Temp: 98.2 F (36.8 C) (08/16 0415) Temp Source: Oral (08/16 0415) BP: 137/96 (08/16 0415) Pulse Rate: 67 (08/16 0415)  Labs: Recent Labs    05/18/18 1042 05/18/18 1250 05/19/18 0405  HGB 12.0 13.3 10.3*  HCT 39.3 39.0 33.3*  PLT 288  --  282  CREATININE  --  0.80 0.81    Estimated Creatinine Clearance: 105.7 mL/min (by C-G formula based on SCr of 0.81 mg/dL).   Medical History: Past Medical History:  Diagnosis Date  . Hypertension   . Sciatica    Assessment: 47 y/o F with acute lower extremity ischemia s/p OR, was on heparin 500 units/hr overnight, now with plans to transition to full dose anti-coagulation.   Goal of Therapy:  Heparin level 0.3-0.7 units/ml Monitor platelets by anticoagulation protocol: Yes   Plan:  -No bolus post-op -Increase heparin to 1000 units/hr -Check heparin level at 1500 -Hgb down some post-op, watch  Emma Medina 05/19/2018,7:17 AM

## 2018-05-20 ENCOUNTER — Other Ambulatory Visit: Payer: Self-pay

## 2018-05-20 LAB — CBC
HCT: 26.5 % — ABNORMAL LOW (ref 36.0–46.0)
HEMOGLOBIN: 8.3 g/dL — AB (ref 12.0–15.0)
MCH: 25.2 pg — AB (ref 26.0–34.0)
MCHC: 31.3 g/dL (ref 30.0–36.0)
MCV: 80.3 fL (ref 78.0–100.0)
PLATELETS: 258 10*3/uL (ref 150–400)
RBC: 3.3 MIL/uL — AB (ref 3.87–5.11)
RDW: 15.9 % — ABNORMAL HIGH (ref 11.5–15.5)
WBC: 13.7 10*3/uL — AB (ref 4.0–10.5)

## 2018-05-20 LAB — HEPARIN LEVEL (UNFRACTIONATED)
HEPARIN UNFRACTIONATED: 0.46 [IU]/mL (ref 0.30–0.70)
Heparin Unfractionated: 0.44 IU/mL (ref 0.30–0.70)

## 2018-05-20 NOTE — Progress Notes (Signed)
ANTICOAGULATION CONSULT NOTE   Pharmacy Consult for Heparin  Indication: Acute limb ischemia, s/p OR  No Known Allergies  Patient Measurements: Height: 5\' 6"  (167.6 cm) Weight: 243 lb (110.2 kg) IBW/kg (Calculated) : 59.3  Vital Signs: Temp: 98.8 F (37.1 C) (08/16 2119) Temp Source: Oral (08/16 2119) BP: 125/99 (08/16 2119) Pulse Rate: 72 (08/16 2119)  Labs: Recent Labs    05/18/18 1042 05/18/18 1250 05/19/18 0405 05/19/18 1439 05/20/18 0044  HGB 12.0 13.3 10.3*  --  8.3*  HCT 39.3 39.0 33.3*  --  26.5*  PLT 288  --  282  --  258  HEPARINUNFRC  --   --   --  0.19* 0.44  CREATININE  --  0.80 0.81  --   --     Estimated Creatinine Clearance: 105.7 mL/min (by C-G formula based on SCr of 0.81 mg/dL).   Medical History: Past Medical History:  Diagnosis Date  . Hypertension   . Sciatica    Assessment: 47 y/o F with acute lower extremity ischemia s/p OR, was on heparin 500 units/hr overnight, now with plans to transition to full dose anti-coagulation.   8/17 AM update: heparin level now therapeutic x 1 after rate increase, Hgb still down post-op (monitor).   Goal of Therapy:  Heparin level 0.3-0.7 units/ml Monitor platelets by anticoagulation protocol: Yes   Plan:  -No bolus post-op -Cont heparin drip at 1300 units/hr -1000 confirmatory heparin level -Hgb still down post-op (monitor)  [name] 05/20/2018,1:19 AM

## 2018-05-20 NOTE — Progress Notes (Signed)
PT Cancellation Note  Patient Details Name: Emma Medina MRN: [id number] DOB: 24-Nov-1970   Cancelled Treatment:    Reason Eval/Treat Not Completed: Patient declined, no reason specified. Pt declining treatment secondary to pain. Advised pt of need to practice steps with correct technique. 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 Will check back as time allows.   Benjiman Core, PTA Pager [phone] Acute Rehab  Allena Katz 05/20/2018, 2:00 PM

## 2018-05-20 NOTE — Progress Notes (Signed)
[facility]  Subjective  - Left foot improved after left lower extremity thromboembolectomy.  Now with palpable pulse in foot.    Objective 119/84 77 99 F (37.2 C) (Oral) 14 100%  Intake/Output Summary (Last 24 hours) at 05/20/2018 1424 Last data filed at 05/20/2018 0100 Gross per 24 hour  Intake 197.48 ml  Output -  Net 197.48 ml    PE: General: resting, NAD Extremities: Dressings c/d/i to LLE, palpable L DP pulse in foot, motor intact  Assessment/Planning: POD #1 s/p LLE thromboembolectomy.  Doing well today.  Palpable pulse in foot.  Minor sensory deficit now that is improving.  Echo with no etiology for embolus.  Remains on heparin gtt.  Plan to get case coordinator involved for affordability of long term anticoagulation.  CTA chest as outpatient with Dr. Donzetta Matters.  Marty Heck 05/20/2018 2:24 PM    Marty Heck, MD [facility] Office: [phone] Pager: [phone]

## 2018-05-20 NOTE — Progress Notes (Signed)
ANTICOAGULATION CONSULT NOTE   Pharmacy Consult for Heparin  Indication: Acute limb ischemia, s/p OR  No Known Allergies  Patient Measurements: Height: 5\' 6"  (167.6 cm) Weight: 238 lb 8.6 oz (108.2 kg) IBW/kg (Calculated) : 59.3  Vital Signs: Temp: 99 F (37.2 C) (08/17 0500) Temp Source: Oral (08/17 0500) BP: 122/71 (08/17 0500) Pulse Rate: 80 (08/17 0500)  Labs: Recent Labs    05/18/18 1042 05/18/18 1250 05/19/18 0405 05/19/18 1439 05/20/18 0044  HGB 12.0 13.3 10.3*  --  8.3*  HCT 39.3 39.0 33.3*  --  26.5*  PLT 288  --  282  --  258  HEPARINUNFRC  --   --   --  0.19* 0.44  CREATININE  --  0.80 0.81  --   --     Estimated Creatinine Clearance: 104.6 mL/min (by C-G formula based on SCr of 0.81 mg/dL).   Medical History: Past Medical History:  Diagnosis Date  . Hypertension   . Sciatica    Assessment: 47 y/o F with acute lower extremity ischemia s/p OR, was on heparin 500 units/hr overnight, now with plans to transition to full dose anti-coagulation.   Heparin level remains therapeutic at 0.46, on 1300 units/hr. Hgb down slightly post-op to 8.3, plt 258 - stable. No s/sx of bleeding. No infusion issues.   Goal of Therapy:  Heparin level 0.3-0.7 units/ml Monitor platelets by anticoagulation protocol: Yes   Plan:  -Cont heparin drip at 1300 units/hr -Monitor heparin level, CBC, and for s/sx of bleeding daily  Doylene Canard, PharmD Clinical Pharmacist  Pager: [phone] Phone: [phone] 05/20/2018,11:44 AM

## 2018-05-21 LAB — CBC
HCT: 26.3 % — ABNORMAL LOW (ref 36.0–46.0)
Hemoglobin: 8.1 g/dL — ABNORMAL LOW (ref 12.0–15.0)
MCH: 24.9 pg — ABNORMAL LOW (ref 26.0–34.0)
MCHC: 30.8 g/dL (ref 30.0–36.0)
MCV: 80.9 fL (ref 78.0–100.0)
PLATELETS: 217 10*3/uL (ref 150–400)
RBC: 3.25 MIL/uL — AB (ref 3.87–5.11)
RDW: 15.9 % — ABNORMAL HIGH (ref 11.5–15.5)
WBC: 10.4 10*3/uL (ref 4.0–10.5)

## 2018-05-21 LAB — HEPARIN LEVEL (UNFRACTIONATED): HEPARIN UNFRACTIONATED: 0.32 [IU]/mL (ref 0.30–0.70)

## 2018-05-21 NOTE — Progress Notes (Addendum)
[facility]  Subjective  - Doing better.  States she now has tingling pins/needles in her left foot and toes.     Objective 103/64 78 98.3 F (36.8 C) (Oral) (!) 27 100% No intake or output data in the 24 hours ending 05/21/18 0752  Left groin soft without hematoma and incision healing well.  Dry Guaze in place. Palpable DP B LE Right groin soft without hematoma Left LE incision clean and healing well.  Left open to air. Heart RRR Lungs non labored breathing   ABI Findings: +---------+------------------+-----+---------+--------+ Right  Rt Pressure (mmHg)IndexWaveform Comment  +---------+------------------+-----+---------+--------+ Brachial 141           triphasic     +---------+------------------+-----+---------+--------+ PTA   171        1.13 triphasic     +---------+------------------+-----+---------+--------+ DP    146        0.97 triphasic     +---------+------------------+-----+---------+--------+ Great Toe99        0.66           +---------+------------------+-----+---------+--------+  +---------+------------------+-----+----------+-------+ Left   Lt Pressure (mmHg)IndexWaveform Comment +---------+------------------+-----+----------+-------+ Brachial 151           triphasic      +---------+------------------+-----+----------+-------+ PTA   67        0.44 monophasic     +---------+------------------+-----+----------+-------+ DP    159        1.05 biphasic      +---------+------------------+-----+----------+-------+ Great Toe98        0.65           +---------+------------------+-----+----------+-------+  +-------+-----------+-----------+------------+------------+ ABI/TBIToday's ABIToday's TBIPrevious ABIPrevious  TBI +-------+-----------+-----------+------------+------------+ Right 1.13    0.66                 +-------+-----------+-----------+------------+------------+ Left  1.05    0.65                 +-------+-----------+-----------+------------+------------+ Assessment/Planning: POD # 2 s/p LLE thromboembolectomy.  Palpable DP B Improved ABI's post op Plan for anticoagulation at discharge pending Insurance and cost for choice. Encourage ambulation and reduction of IV pain medication use.  Plan f/u CTA of chest. Cont. Heparin for anticoagulation. HGB 8.1 asymptomatic will check CBC in am.  Roxy Horseman 05/21/2018 7:52 AM --  Laboratory Lab Results: Recent Labs    05/20/18 0044 05/21/18 0311  WBC 13.7* 10.4  HGB 8.3* 8.1*  HCT 26.5* 26.3*  PLT 258 217   BMET Recent Labs    05/18/18 1250 05/19/18 0405  NA 140 137  K 3.4* 4.2  CL 104 106  CO2  --  23  GLUCOSE 88 201*  BUN 9 6  CREATININE 0.80 0.81  CALCIUM  --  8.2*    COAG No results found for: INR, PROTIME No results found for: PTT  I have seen and evaluated the patient. I agree with the PA note as documented above. Palpable DP pulse.  Foot warm.  Incisions look great.  Discussed transition to PO anticoagulation tomorrow with assistance from care manager then discharge home hopefully.  CTA chest as outpatient to evaluate for embolic source.  Marty Heck, MD [facility] [place] Office: [phone] Pager: [phone]

## 2018-05-21 NOTE — Progress Notes (Signed)
Occupational Therapy Treatment Patient Details Name: MITSUYE SCHRODT MRN: [id number] DOB: 1971/04/07 Today's Date: 05/21/2018    History of present illness Pt is a 47 y.o. female admitted 05/18/18 with c/o L foot numbness. Now s/p LLE embolectomy and baloon angioplasty of posterior tibial artery. PMH includes sciatica, HTN.   OT comments  Pt. Seen for skilled OT session.  Able to verbalize safe toilet transfer and hygiene.  Returned demo of LB dressing tasks.  Reports dtrs. Available to assist at home as needed.  Declines need for further acute OT as she feels confidant with her current level of function  And ability to complete ADLS.  Will alert OTR/L to sign off, pt. Verbalized understanding and agrees.     Follow Up Recommendations  No OT follow up;Supervision - Intermittent    Equipment Recommendations       Recommendations for Other Services      Precautions / Restrictions Precautions Precautions: Fall Restrictions Weight Bearing Restrictions: No       Mobility Bed Mobility Overal bed mobility: Independent                Transfers                      Balance                                           ADL either performed or assessed with clinical judgement   ADL Overall ADL's : Needs assistance/impaired               Lower Body Bathing Details (indicate cue type and reason): pt. reports she completed her bath without assistance last night. states she was seated for ub/lb and was able to lean and position legs to bathe peri areas without standing     Lower Body Dressing: Set up;Sitting/lateral leans Lower Body Dressing Details (indicate cue type and reason): pt. able to demonstrate tech. of seated while pulling LLE into bed and hooking sock/clothing over L foot first then R.  reviewed always L leg first, and also "pre-loading" garments to eliminate multiple sit/stand. pt. verbalized understanding and states she also has dtrs. that  will be available to assist prn   Toilet Transfer Details (indicate cue type and reason): pt. reports she has been amb. to/from b.room with rn.  declines need to complete again as she had just returned from b.room and states there were no safety questions or concerns with any of the components   Toileting - Clothing Manipulation Details (indicate cue type and reason): pt. reports no difficulty with peri care   Tub/Shower Transfer Details (indicate cue type and reason): pt. sponge bathing   General ADL Comments: pt. able to complete LB dressing without use of A/E.  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  states she has no further questions/concerns. feels ready for home and has no other acute OT needs at this time.  reviewed OTR/L to sign off pt. verbalized understanding and agreed.      Vision       Perception     Praxis      Cognition Arousal/Alertness: Awake/alert Behavior During Therapy: WFL for tasks assessed/performed Overall Cognitive Status: Within Functional Limits for tasks assessed  Exercises     Shoulder Instructions       General Comments      Pertinent Vitals/ Pain       Pain Assessment: Faces Faces Pain Scale: Hurts a little bit Pain Location: L foot "its starting to wake up i can feel it" Pain Descriptors / Indicators: Tingling;Pins and needles Pain Intervention(s): Limited activity within patient's tolerance  Home Living                                          Prior Functioning/Environment              Frequency  Min 2X/week        Progress Toward Goals  OT Goals(current goals can now be found in the care plan section)  Progress towards OT goals: Goals met/education completed, patient discharged from [facility] Discharge plan remains appropriate    Co-evaluation                 AM-PAC PT "6 Clicks" Daily Activity      Outcome Measure   Help from another person eating meals?: None Help from another person taking care of personal grooming?: None Help from another person toileting, which includes using toliet, bedpan, or urinal?: None Help from another person bathing (including washing, rinsing, drying)?: A Little Help from another person to put on and taking off regular upper body clothing?: None Help from another person to put on and taking off regular lower body clothing?: A Little 6 Click Score: 22    End of Session    OT Visit Diagnosis: Other abnormalities of gait and mobility (R26.89);Pain Pain - Right/Left: Left Pain - part of body: Ankle and joints of foot;Leg   Activity Tolerance Patient tolerated treatment well   Patient Left in bed;with call bell/phone within reach   Nurse Communication          Time: 3014-9969 OT Time Calculation (min): 10 min  Charges: OT General Charges $OT Visit: 1 Visit OT Treatments $Self Care/Home Management : 8-22 mins   Briceyda Coffin, COTA/L 05/21/2018, 9:46 AM

## 2018-05-21 NOTE — Care Management (Signed)
Pt is uninsured.  NOAC cost without insurance typically >$500/month.  Pt can be given 30 day free card and patient assistance application for NOAC of choice. Pt receives medical care and prescriptions at [facility], which may be able to provide some assistance with NOAC or transition to another anticoagulant if patient unable to afford NOAC on patient assistance program.

## 2018-05-21 NOTE — Plan of Care (Signed)
Pt. Clear for d/c from acute OT.  OTR/L to sign off.

## 2018-05-21 NOTE — Progress Notes (Signed)
ANTICOAGULATION CONSULT NOTE   Pharmacy Consult for Heparin  Indication: Acute limb ischemia, s/p OR  No Known Allergies  Patient Measurements: Height: 5\' 6"  (167.6 cm) Weight: 238 lb 8.6 oz (108.2 kg) IBW/kg (Calculated) : 59.3  Vital Signs: Temp: 98.3 F (36.8 C) (08/18 0351) Temp Source: Oral (08/18 0351) BP: 127/68 (08/18 1118) Pulse Rate: 80 (08/18 1118)  Labs: Recent Labs    05/18/18 1250 05/19/18 0405  05/20/18 0044 05/20/18 1114 05/21/18 0311 05/21/18 0742  HGB 13.3 10.3*  --  8.3*  --  8.1*  --   HCT 39.0 33.3*  --  26.5*  --  26.3*  --   PLT  --  282  --  258  --  217  --   HEPARINUNFRC  --   --    < > 0.44 0.46  --  0.32  CREATININE 0.80 0.81  --   --   --   --   --    < > = values in this interval not displayed.    Estimated Creatinine Clearance: 104.6 mL/min (by C-G formula based on SCr of 0.81 mg/dL).   Medical History: Past Medical History:  Diagnosis Date  . Hypertension   . Sciatica    Assessment: 47 y/o F with acute lower extremity ischemia s/p OR, was on heparin 500 units/hr overnight, now with plans to transition to full dose anti-coagulation.   Heparin level continues to be therapeutic on lower end of range at 0.32, on 1300 units/hr. Hgb continuing to drift down slightly to 8.1, plt 217. No s/sx of bleeding. No infusion issues.   Goal of Therapy:  Heparin level 0.3-0.7 units/ml Monitor platelets by anticoagulation protocol: Yes   Plan:  -Increase heparin drip slightly to 1350 units/hr to keep in goal range -Monitor heparin level, CBC, and for s/sx of bleeding daily  Doylene Canard, PharmD Clinical Pharmacist  Pager: [phone] Phone: [phone] 05/21/2018,11:41 AM

## 2018-05-22 LAB — CBC
HEMATOCRIT: 28.2 % — AB (ref 36.0–46.0)
HEMOGLOBIN: 8.7 g/dL — AB (ref 12.0–15.0)
MCH: 24.6 pg — AB (ref 26.0–34.0)
MCHC: 30.9 g/dL (ref 30.0–36.0)
MCV: 79.9 fL (ref 78.0–100.0)
PLATELETS: 259 10*3/uL (ref 150–400)
RBC: 3.53 MIL/uL — AB (ref 3.87–5.11)
RDW: 15.7 % — ABNORMAL HIGH (ref 11.5–15.5)
WBC: 9.5 10*3/uL (ref 4.0–10.5)

## 2018-05-22 LAB — BASIC METABOLIC PANEL
ANION GAP: 6 (ref 5–15)
BUN: 5 mg/dL — ABNORMAL LOW (ref 6–20)
CHLORIDE: 106 mmol/L (ref 98–111)
CO2: 28 mmol/L (ref 22–32)
Calcium: 8.4 mg/dL — ABNORMAL LOW (ref 8.9–10.3)
Creatinine, Ser: 0.8 mg/dL (ref 0.44–1.00)
GFR calc Af Amer: >60 mL/min (ref >60)
GFR calc non Af Amer: >60 mL/min (ref >60)
GLUCOSE: 107 mg/dL — AB (ref 70–99)
POTASSIUM: 3.6 mmol/L (ref 3.5–5.1)
Sodium: 140 mmol/L (ref 135–145)

## 2018-05-22 LAB — HEPARIN LEVEL (UNFRACTIONATED): Heparin Unfractionated: 0.42 IU/mL (ref 0.30–0.70)

## 2018-05-22 NOTE — Progress Notes (Signed)
ANTICOAGULATION CONSULT NOTE  Pharmacy Consult:  Heparin  Indication: Acute limb ischemia  No Known Allergies  Patient Measurements: Height: 5\' 6"  (167.6 cm) Weight: 238 lb 8.6 oz (108.2 kg) IBW/kg (Calculated) : 59.3  Vital Signs:    Labs: Recent Labs    05/20/18 0044 05/20/18 1114 05/21/18 0311 05/21/18 0742 05/22/18 0708  HGB 8.3*  --  8.1*  --  8.7*  HCT 26.5*  --  26.3*  --  28.2*  PLT 258  --  217  --  259  HEPARINUNFRC 0.44 0.46  --  0.32 0.42  CREATININE  --   --   --   --  0.80    Estimated Creatinine Clearance: 106 mL/min (by C-G formula based on SCr of 0.8 mg/dL).   Assessment: 59 YOF with acute lower extremity ischemia s/p embolectomy on 05/19/18.  Patient continues on IV heparin.  Heparin level is therapeutic; no bleeding documented.    Goal of Therapy:  Heparin level 0.3-0.7 units/ml Monitor platelets by anticoagulation protocol: Yes     Plan:  Continue heparin gtt at 1350 units/hr Daily heparin level and CBC Monitor for s/sx of bleeding F/U with transitioning to oral anticoagulation   Chimere Klingensmith D. Mina Marble, PharmD, BCPS, [place] 05/22/2018, 8:40 AM

## 2018-05-22 NOTE — Progress Notes (Addendum)
[facility] [place]  Subjective  - Mobility is an issue.  She has pain and tingling in the left foot.   Objective 110/64 80 98.3 F (36.8 C) (Oral) 19 98%  Intake/Output Summary (Last 24 hours) at 05/22/2018 0805 Last data filed at 05/22/2018 0321 Gross per 24 hour  Intake 1334.21 ml  Output -  Net 1334.21 ml    Palpable AT left LE Incisions all healing well, left groin soft with out hematoma Right groin stick site without hematoma Sensation left LE decreased compared to right. Lungs non labored breathing  Post op ABI's ABI/TBIToday's ABIToday's TBIPrevious ABIPrevious TBI +-------+-----------+-----------+------------+------------+ Right 1.13    0.66                 +-------+-----------+-----------+------------+------------+ Left  1.05    0.65                 +-------+-----------+-----------+------------+------------+  Assessment/Planning: POD # 3  s/p LLE thromboembolectomy.  Patent arterial flow with palpable pulse Ambulation training and safety are an issue.  I will ask PT to work with her today Rolling walker and 3 in 1 ordered for home We will plan to send her home with 30 day free xarelto and then after out patient CTA we could possible do dual anti platelet therapy.   Possible discharge tomorrow pending   F/U in 2-3 weeks with CTA Chest ab/pelvis and staple removal  Roxy Horseman 05/22/2018 8:05 AM --  Laboratory Lab Results: Recent Labs    05/21/18 0311 05/22/18 0708  WBC 10.4 9.5  HGB 8.1* 8.7*  HCT 26.3* 28.2*  PLT 217 259   BMET No results for input(s): NA, K, CL, CO2, GLUCOSE, BUN, CREATININE, CALCIUM in the last 72 hours.  COAG No results found for: INR, PROTIME No results found for: PTT   I have interviewed and examined patient with PA and agree with assessment and plan above.  She has a palpable dorsalis pedis pulse of the foot but does not have a  very good posterior tibial signal.  Unfortunately not sure that the dorsalis pedis extends much further than midfoot and she does have numbness of her toes.  We will plan to anticoagulate her and get a CT angio an outpatient given that her echo was negative.  We will also keep her on aspirin and a statin drug indefinitely.  Physical therapy to get her more mobile today and plan for disposed to home when she is cleared physical therapy and anticoagulation is arranged.  Lydell Moga C. Donzetta Matters, MD [facility] [place] Office: [phone] Pager: [phone]

## 2018-05-22 NOTE — Progress Notes (Signed)
Pt declines ambulation, pt is moving around the room independently but she is not taking the proper steps, she is hopping with one leg and the and a walker. May benefit from PT's guidance.

## 2018-05-22 NOTE — Progress Notes (Signed)
Physical Therapy Discharge Patient Details Name: Emma Medina MRN: [id number] DOB: 02-16-1971 Today's Date: 05/22/2018 Time: 7011-0034 PT Time Calculation (min) (ACUTE ONLY): 11 min  Patient discharged from PT services secondary to goals met and no further PT needs identified.  Please see latest therapy progress note for current level of functioning and progress toward goals.    Progress and discharge plan discussed with patient and/or caregiver: Patient/Caregiver agrees with plan  GP     Denice Paradise 05/22/2018, 10:29 AM  [facility] [phone] (pager) [place] [phone] (pager)

## 2018-05-22 NOTE — Progress Notes (Signed)
Physical Therapy Treatment and D/C Patient Details Name: Emma Medina MRN: [id number] DOB: Feb 13, 1971 Today's Date: 05/22/2018    History of Present Illness Pt is a 47 y.o. female admitted 05/18/18 with c/o L foot numbness. Now s/p LLE embolectomy and baloon angioplasty of posterior tibial artery. PMH includes sciatica, HTN.    PT Comments    Pt admitted with above diagnosis. Pt currently with functional limitations due to balance and endurance deficits. Pt was able to ambulate with RW without assist.  Has met ambulation goal except distance with pt self limiting.  She states she knows what to do as she is a [employer].  She is safe with gait without LOB even dynamic actitivies.  She states she has been Modif I in the room for days and was getting up on arrival.  Refuses to practice steps as she has 2 rails and does not feel it necessary.  Will d/c PT as pt has met goal of Modif I with ambulation and can function in home.    Follow Up Recommendations  No PT follow up     Equipment Recommendations  Rolling walker with 5" wheels;3in1 (PT)    Recommendations for Other Services       Precautions / Restrictions Precautions Precautions: Fall Restrictions Weight Bearing Restrictions: No    Mobility  Bed Mobility Overal bed mobility: Independent                Transfers Overall transfer level: Modified independent Equipment used: Rolling walker (2 wheeled) Transfers: Sit to/from Stand              Ambulation/Gait Ambulation/Gait assistance: Modified independent (Device/Increase time) Gait Distance (Feet): 75 Feet Assistive device: Rolling walker (2 wheeled) Gait Pattern/deviations: Step-to pattern;Decreased weight shift to left;Antalgic Gait velocity: Decreased Gait velocity interpretation: <1.8 ft/sec, indicate of risk for recurrent falls General Gait Details: Slow, antalgic amb with RW and Modif I; cues for correct sequencing with RW. Pt with decreased weight shift onto  painful/numb L foot; encouraged WBAT and pt was able to bear some weight on left foot.  Pt was able to manuever RW without assist into bathroom and pull down underwear without assist.     Stairs Stairs: (Declined practice stating that she knows technique w/2 rails)           Wheelchair Mobility    Modified Rankin (Stroke Patients Only)       Balance Overall balance assessment: Needs assistance   Sitting balance-Leahy Scale: Good     Standing balance support: No upper extremity supported;During functional activity Standing balance-Leahy Scale: Good Standing balance comment: Can static stand with no UE support; indep with pericare with single UE support while standing                            Cognition Arousal/Alertness: Awake/alert Behavior During Therapy: WFL for tasks assessed/performed Overall Cognitive Status: Within Functional Limits for tasks assessed                                        Exercises Other Exercises Other Exercises: Encouraged regular LE ROM, including hip flex, knee flex/ext, ankle pumps    General Comments        Pertinent Vitals/Pain Pain Assessment: Faces Faces Pain Scale: Hurts a little bit Pain Location: L foot "its starting to wake up i can  feel it" Pain Descriptors / Indicators: Tingling;Pins and needles Pain Intervention(s): Limited activity within patient's tolerance;Monitored during session;Repositioned    Home Living                      Prior Function            PT Goals (current goals can now be found in the care plan section) Acute Rehab PT Goals PT Goal Formulation: All assessment and education complete, DC therapy Progress towards PT goals: Goals met/education completed, patient discharged from PT    Frequency    Min 3X/week      PT Plan Current plan remains appropriate    Co-evaluation              AM-PAC PT "6 Clicks" Daily Activity  Outcome Measure   Difficulty turning over in bed (including adjusting bedclothes, sheets and blankets)?: None Difficulty moving from lying on back to sitting on the side of the bed? : None Difficulty sitting down on and standing up from a chair with arms (e.g., wheelchair, bedside commode, etc,.)?: None Help needed moving to and from a bed to chair (including a wheelchair)?: None Help needed walking in hospital room?: None Help needed climbing 3-5 steps with a railing? : None 6 Click Score: 24    End of Session Equipment Utilized During Treatment: Gait belt Activity Tolerance: Patient tolerated treatment well Patient left: with call bell/phone within reach(in bathroom) Nurse Communication: Mobility status PT Visit Diagnosis: Other abnormalities of gait and mobility (R26.89);Pain Pain - Right/Left: Left Pain - part of body: Leg     Time: 1006-1017 PT Time Calculation (min) (ACUTE ONLY): 11 min  Charges:  $Gait Training: 8-22 mins                     [place] [phone] (pager)    Denice Paradise 05/22/2018, 10:23 AM

## 2018-05-23 LAB — CBC
HEMATOCRIT: 26.3 % — AB (ref 36.0–46.0)
HEMOGLOBIN: 8.1 g/dL — AB (ref 12.0–15.0)
MCH: 24.7 pg — ABNORMAL LOW (ref 26.0–34.0)
MCHC: 30.8 g/dL (ref 30.0–36.0)
MCV: 80.2 fL (ref 78.0–100.0)
Platelets: 245 10*3/uL (ref 150–400)
RBC: 3.28 MIL/uL — ABNORMAL LOW (ref 3.87–5.11)
RDW: 15.4 % (ref 11.5–15.5)
WBC: 8.2 10*3/uL (ref 4.0–10.5)

## 2018-05-23 LAB — HEPARIN LEVEL (UNFRACTIONATED): Heparin Unfractionated: 0.3 IU/mL (ref 0.30–0.70)

## 2018-05-23 MED ORDER — RIVAROXABAN 15 MG PO TABS
15.0000 mg | ORAL_TABLET | Freq: Two times a day (BID) | ORAL | Status: DC
  Administered 2018-05-23 (×2): 15 mg via ORAL
  Filled 2018-05-23 (×2): qty 1

## 2018-05-23 MED ORDER — ASPIRIN 81 MG PO TBEC: 81.0000 mg | DELAYED_RELEASE_TABLET | Freq: Every day | ORAL | Status: AC

## 2018-05-23 MED ORDER — RIVAROXABAN (XARELTO) VTE STARTER PACK (15 & 20 MG): ORAL_TABLET | ORAL | 0 refills | Status: DC

## 2018-05-23 MED ORDER — RIVAROXABAN 15 MG PO TABS: 15.0000 mg | ORAL_TABLET | Freq: Two times a day (BID) | ORAL | 0 refills | Status: DC

## 2018-05-23 MED ORDER — RIVAROXABAN 20 MG PO TABS: 20.0000 mg | ORAL_TABLET | Freq: Every day | ORAL | 0 refills | Status: DC

## 2018-05-23 MED ORDER — RIVAROXABAN 20 MG PO TABS: 20.0000 mg | ORAL_TABLET | Freq: Every day | ORAL | Status: DC

## 2018-05-23 MED ORDER — ROSUVASTATIN CALCIUM 10 MG PO TABS: 10.0000 mg | ORAL_TABLET | Freq: Every day | ORAL | 3 refills | Status: DC

## 2018-05-23 MED ORDER — OXYCODONE-ACETAMINOPHEN 5-325 MG PO TABS: 1.0000 | ORAL_TABLET | ORAL | 0 refills | Status: DC | PRN

## 2018-05-23 NOTE — Progress Notes (Signed)
ANTICOAGULATION CONSULT NOTE  Pharmacy Consult:  Xarelto Indication: DVT s/p thrombectomy  No Known Allergies  Patient Measurements: Height: 5\' 6"  (167.6 cm) Weight: 238 lb 8.6 oz (108.2 kg) IBW/kg (Calculated) : 59.3  Vital Signs: Temp: 98.5 F (36.9 C) (08/20 0350) Temp Source: Oral (08/20 0350) BP: 110/66 (08/20 0350) Pulse Rate: 78 (08/20 0350)  Labs: Recent Labs    05/21/18 0311 05/21/18 0742 05/22/18 0708 05/23/18 0333  HGB 8.1*  --  8.7* 8.1*  HCT 26.3*  --  28.2* 26.3*  PLT 217  --  259 245  HEPARINUNFRC  --  0.32 0.42 0.30  CREATININE  --   --  0.80  --     Estimated Creatinine Clearance: 106 mL/min (by C-G formula based on SCr of 0.8 mg/dL).  Assessment: 47 yo female with acute lower extremity ischemia s/p embolectomy 8/16, to transition to Xarelto     Plan:  Xarelto 15 mg BID x 21 days, then Xarelto 20 mg daily  Phillis Knack, PharmD, BCPS  05/23/2018, 7:11 AM

## 2018-05-23 NOTE — Progress Notes (Addendum)
[facility] [place]  Subjective  - Doing better with walking.  Her sensation feels like it is coming back more.     Objective 110/66 78 98.5 F (36.9 C) (Oral) 15 100%  Intake/Output Summary (Last 24 hours) at 05/23/2018 0720 Last data filed at 05/23/2018 0400 Gross per 24 hour  Intake 2230.08 ml  Output -  Net 2230.08 ml   Left groin incision healing well, dry guaze in place. Palpable left AT, doppler signal PT Toes with active range of motion no discoloration to the skin, cooler to touch.   Heart RRR Lungs non labored breathing  Assessment/Planning: POD # 4 LLE thromboembolectomy.   We will discharge her home today on Xarelto.  She will f/u in 2-3 weeks with a CTA of chest abdomin and pelvis to look for source of thrombus.  If no source is found we will convert her to dual antiplatelet therapy at 1 month. First Xarelto dose today prior to discharge home. Encouraged ambulation.  Roxy Horseman 05/23/2018 7:20 AM --  Laboratory Lab Results: Recent Labs    05/22/18 0708 05/23/18 0333  WBC 9.5 8.2  HGB 8.7* 8.1*  HCT 28.2* 26.3*  PLT 259 245   BMET Recent Labs    05/22/18 0708  NA 140  K 3.6  CL 106  CO2 28  GLUCOSE 107*  BUN 5*  CREATININE 0.80  CALCIUM 8.4*    COAG No results found for: INR, PROTIME No results found for: PTT   I have independently interviewed and examined patient and agree with PA assessment and plan above.  Plan will be to discharge home get CT scan as outpatient.  Aspirin and statin will be indefinite.  Xarelto has been approved for 30 days.  Sanyia Dini C. Donzetta Matters, MD [facility] [place] Office: [phone] Pager: [phone]

## 2018-05-23 NOTE — Care Management Note (Addendum)
Case Management Note  Patient Details  Name: Emma Medina MRN: [id number] Date of Birth: 1971-04-22  Subjective/Objective:            Admitted with obstructive thrombus        Action/Plan:  Patient follows at [facility]. PCP [place]. Will DC on Xaralto. Xaralto 30 day card given and explained to patient. She will follow up at [facility] 8/30 and understands how to initiate patient assistance. No further CM needs identified.  Expected Discharge Date:  05/23/18               Expected Discharge Plan:  Home/Self Care  In-House Referral:     Discharge planning Services  CM Consult, Follow-up appt scheduled, Medication Assistance  Post Acute Care Choice:    Choice offered to:     DME Arranged:    DME Agency:     HH Arranged:    HH Agency:     Status of Service:  Completed, signed off  If discussed at [employer] of Stay Meetings, dates discussed:    Additional Comments:  Carles Collet, RN 05/23/2018, 8:59 AM

## 2018-05-23 NOTE — Progress Notes (Signed)
RW requested through [facility] to be delivered to room prior to DC.

## 2018-05-23 NOTE — Progress Notes (Signed)
IV and telemetry discontinued. CCMD notified. Discharge instructions reviewed with patient. All questions answered. Patient waiting on walker to be delivered to room.  Emelda Fear, RN

## 2018-05-23 NOTE — Discharge Instructions (Signed)
° °[facility] [place] ° °Discharge instructions ° °Lower Extremity Bypass Surgery ° °Please refer to the following instruction for your post-procedure care. Your surgeon or physician assistant will discuss any changes with you. ° °Activity ° °You are encouraged to walk as much as you can. You can slowly return to normal activities during the month after your surgery. Avoid strenuous activity and heavy lifting until your doctor tells you it's OK. Avoid activities such as vacuuming or swinging a golf club. Do not drive until your doctor give the OK and you are no longer taking prescription pain medications. It is also normal to have difficulty with sleep habits, eating and bowel movement after surgery. These will go away with time. ° °Bathing/Showering ° °Shower daily after you go home. Do not soak in a bathtub, hot tub, or swim until the incision heals completely. ° °Incision Care ° °Clean your incision with mild soap and water. Shower every day. Pat the area dry with a clean towel. You do not need a bandage unless otherwise instructed. Do not apply any ointments or creams to your incision. If you have open wounds you will be instructed how to care for them or a visiting nurse may be arranged for you. If you have staples or sutures along your incision they will be removed at your post-op appointment. You may have skin glue on your incision. Do not peel it off. It will come off on its own in about one week. ° °Wash the groin wound with soap and water daily and pat dry. (No tub bath-only shower)  Then put a dry gauze or washcloth in the groin to keep this area dry to help prevent wound infection.  Do this daily and as needed.  Do not use Vaseline or neosporin on your incisions.  Only use soap and water on your incisions and then protect and keep dry. ° °Diet ° °Resume your normal diet. There are no special food restrictions following this procedure. A low fat/ low cholesterol diet is  recommended for all patients with vascular disease. In order to heal from your surgery, it is CRITICAL to get adequate nutrition. Your body requires vitamins, minerals, and protein. Vegetables are the best source of vitamins and minerals. Vegetables also provide the perfect balance of protein. Processed food has little nutritional value, so try to avoid this. ° °Medications ° °Resume taking all your medications unless your doctor or physician assistant tells you not to. If your incision is causing pain, you may take over-the-counter pain relievers such as acetaminophen (Tylenol). If you were prescribed a stronger pain medication, please aware these medication can cause nausea and constipation. Prevent nausea by taking the medication with a snack or meal. Avoid constipation by drinking plenty of fluids and eating foods with high amount of fiber, such as fruits, vegetables, and grains. Take Colace 100 mg (an over-the-counter stool softener) twice a day as needed for constipation.  °Do not take Tylenol if you are taking prescription pain medications. ° °Follow Up ° °Our office will schedule a follow up appointment 2-3 weeks following discharge. ° °Please call us immediately for any of the following conditions ° °•Severe or worsening pain in your legs or feet while at rest or while walking •Increase pain, redness, warmth, or drainage (pus) from your incision site(s) °Fever of 101 degree or higher °The swelling in your leg with the bypass suddenly worsens and becomes more painful than when you were in the hospital °If you have   been instructed to feel your graft pulse then you should do so every day. If you can no longer feel this pulse, call the office immediately. Not all patients are given this instruction.  Leg swelling is common after leg bypass surgery.  The swelling should improve over a few months following surgery. To improve the swelling, you may elevate your legs above the level of your heart while you are  sitting or resting. Your surgeon or physician assistant may ask you to apply an ACE wrap or wear compression (TED) stockings to help to reduce swelling.  Reduce your risk of vascular disease  Stop smoking. If you would like help call [facility] at 1-800-QUIT-NOW 731-878-5528) or [place] at [phone].  Manage your cholesterol Maintain a desired weight Control your diabetes weight Control your diabetes Keep your blood pressure down  If you have any questions, please call the office at [phone]    Information on my medicine - XARELTO (rivaroxaban)  This medication education was reviewed with me or my healthcare representative as part of my discharge preparation.  The pharmacist that spoke with me during my hospital stay was:  Saundra Shelling, [place]? Xarelto was prescribed to treat blood clots that may have been found in the veins of your legs (deep vein thrombosis) or in your lungs (pulmonary embolism) and to reduce the risk of them occurring again.  What do you need to know about Xarelto? The starting dose is one 15 mg tablet taken TWICE daily with food for the FIRST 21 DAYS then on (enter date)  06/13/18  the dose is changed to one 20 mg tablet taken ONCE A DAY with your evening meal.  DO NOT stop taking Xarelto without talking to the health care provider who prescribed the medication.  Refill your prescription for 20 mg tablets before you run out.  After discharge, you should have regular check-up appointments with your healthcare provider that is prescribing your Xarelto.  In the future your dose may need to be changed if your kidney function changes by a significant amount.  What do you do if you miss a dose? If you are taking Xarelto TWICE DAILY and you miss a dose, take it as soon as you remember. You may take two 15 mg tablets (total 30 mg) at the same time then resume your regularly scheduled 15 mg twice daily the next  day.  If you are taking Xarelto ONCE DAILY and you miss a dose, take it as soon as you remember on the same day then continue your regularly scheduled once daily regimen the next day. Do not take two doses of Xarelto at the same time.   Important Safety Information Xarelto is a blood thinner medicine that can cause bleeding. You should call your healthcare provider right away if you experience any of the following: ? Bleeding from an injury or your nose that does not stop. ? Unusual colored urine (red or dark brown) or unusual colored stools (red or black). ? Unusual bruising for unknown reasons. ? A serious fall or if you hit your head (even if there is no bleeding).  Some medicines may interact with Xarelto and might increase your risk of bleeding while on Xarelto. To help avoid this, consult your healthcare provider or pharmacist prior to using any new prescription or non-prescription medications, including herbals, vitamins, non-steroidal anti-inflammatory drugs (NSAIDs) and supplements.  This website has more information on Xarelto: [email or web address]

## 2018-05-25 ENCOUNTER — Encounter: Payer: Self-pay | Admitting: *Deleted

## 2018-05-25 ENCOUNTER — Other Ambulatory Visit: Payer: Self-pay

## 2018-05-25 DIAGNOSIS — I8290 Acute embolism and thrombosis of unspecified vein: Secondary | ICD-10-CM

## 2018-05-25 DIAGNOSIS — I998 Other disorder of circulatory system: Secondary | ICD-10-CM

## 2018-05-25 DIAGNOSIS — I82499 Acute embolism and thrombosis of other specified deep vein of unspecified lower extremity: Secondary | ICD-10-CM

## 2018-05-28 NOTE — Discharge Summary (Signed)
[facility] Discharge Summary   Patient ID:  Emma Medina MRN: [id number] DOB/AGE: May 08, 1971 47 y.o.  Admit date: 05/18/2018 Discharge date: 05/23/2018 Date of Surgery: 05/18/2018 Surgeon: Surgeon(s): Waynetta Sandy, MD  Admission Diagnosis: Left leg claudication [facility]) [I73.9] Ischemia of foot [I99.8]  Discharge Diagnoses:  Left leg claudication ([place]) [I73.9] Ischemia of foot [I99.8]  Secondary Diagnoses: Past Medical History:  Diagnosis Date  . Hypertension   . Obstructive thrombus   . Sciatica     Procedure(s): LEFT LEG THROMBECTOMY, BALLOON ANGIOPLASTY LEFT POSTERIOR TIBIAL ARTERY AORTAGRAM WITH LEFT LEG RUNOFF, MYNX CLOSURE DEVICE PLACED IN RIGHT FEMORAL ARTERY  Discharged Condition: good  HPI: 47 year old female presented today with acute left lower extremity ischemia that is been present for 5 days with numbness and pain.  She is undergone angiogram which demonstrated possible thrombus existing her left common iliac artery as well as occlusion of her trifurcation below the knee.  She was subsequently indicated for left lower extremity intervention via open approach.   Hospital Course:  KADIAN BARCELLOS is a 47 y.o. female is S/P Left Procedure(s): LEFT LEG THROMBECTOMY, BALLOON ANGIOPLASTY LEFT POSTERIOR TIBIAL ARTERY AORTAGRAM WITH LEFT LEG RUNOFF, MYNX CLOSURE DEVICE PLACED IN RIGHT FEMORAL ARTERY   Consults:  Treatment Team:  Waynetta Sandy, MD  There is a palpable dorsalis pedis pulse proximally in the foot Posterior tibial signal to the level of the ankle Toes are warm and motor intact without evidence of ischemia Echo with no etiology for embolus.  Unfortunately not sure that the dorsalis pedis extends much further than midfoot and she does have numbness of her toes.  We will plan to anticoagulate her and get a CT angio an outpatient given that her echo was negative.  Discharged on 30 day supply of Xarelto,  Crestor, 81 mg Aspirin.    Significant Diagnostic Studies: CBC Lab Results  Component Value Date   WBC 8.2 05/23/2018   HGB 8.1 (L) 05/23/2018   HCT 26.3 (L) 05/23/2018   MCV 80.2 05/23/2018   PLT 245 05/23/2018    BMET    Component Value Date/Time   NA 140 05/22/2018 0708   K 3.6 05/22/2018 0708   CL 106 05/22/2018 0708   CO2 28 05/22/2018 0708   GLUCOSE 107 (H) 05/22/2018 0708   BUN 5 (L) 05/22/2018 0708   CREATININE 0.80 05/22/2018 0708   CALCIUM 8.4 (L) 05/22/2018 0708   GFRNONAA >60 05/22/2018 0708   GFRAA >60 05/22/2018 0708   COAG No results found for: INR, PROTIME   Disposition:  Discharge to :Home Discharge Instructions    Call MD for:  redness, tenderness, or signs of infection (pain, swelling, bleeding, redness, odor or green/yellow discharge around incision site)   Complete by:  As directed    Call MD for:  severe or increased pain, loss or decreased feeling  in affected limb(s)   Complete by:  As directed    Call MD for:  temperature >100.5   Complete by:  As directed    Resume previous diet   Complete by:  As directed      Allergies as of 05/23/2018   No Known Allergies     Medication List    STOP taking these medications   predniSONE 20 MG tablet Commonly known as:  DELTASONE     TAKE these medications   aspirin 81 MG EC tablet Take 1 tablet (81 mg total) by mouth daily.   FLEXERIL PO Take 10  mg by mouth 2 (two) times daily.   oxyCODONE-acetaminophen 5-325 MG tablet Commonly known as:  PERCOCET/ROXICET Take 1 tablet by mouth every 4 (four) hours as needed for moderate pain.   Rivaroxaban 15 & 20 MG Tbpk Take as directed on package: Start with one 15mg  tablet by mouth twice a day with food. On Day 22, switch to one 20mg  tablet once a day with food.   rosuvastatin 10 MG tablet Commonly known as:  CRESTOR Take 1 tablet (10 mg total) by mouth daily.      Verbal and written Discharge instructions given to the patient. Wound care per  Discharge AVS Follow-up Information    Waynetta Sandy, MD Follow up in 2 week(s).   Specialties:  Vascular Surgery, Cardiology Why:  office will call Contact information: [place] [address] [phone]           Signed: Roxy Horseman 05/28/2018, 11:28 AM

## 2018-05-29 ENCOUNTER — Telehealth: Payer: Self-pay | Admitting: Vascular Surgery

## 2018-05-29 NOTE — Telephone Encounter (Signed)
Patient called complaining of some swelling in her below-knee incision on her leg.  She denies any drainage.  I advised her that we will try to get her an early office visit tomorrow to check her incision.  She will call me back this evening if she has continued increase in swelling.  Ruta Hinds, MD [facility] [place] Office: [phone] Pager: [phone]

## 2018-05-30 ENCOUNTER — Other Ambulatory Visit: Payer: Self-pay

## 2018-05-30 ENCOUNTER — Encounter: Payer: Self-pay | Admitting: Family

## 2018-05-30 ENCOUNTER — Ambulatory Visit (INDEPENDENT_AMBULATORY_CARE_PROVIDER_SITE_OTHER): Payer: Self-pay | Admitting: Family

## 2018-05-30 VITALS — BP 123/82 | HR 99 | Temp 98.2°F | Resp 18 | Ht 66.0 in | Wt 238.0 lb

## 2018-05-30 DIAGNOSIS — I8290 Acute embolism and thrombosis of unspecified vein: Secondary | ICD-10-CM

## 2018-05-30 DIAGNOSIS — I779 Disorder of arteries and arterioles, unspecified: Secondary | ICD-10-CM

## 2018-05-30 DIAGNOSIS — Z87891 Personal history of nicotine dependence: Secondary | ICD-10-CM

## 2018-05-30 MED ORDER — CEPHALEXIN 500 MG PO CAPS: 500.0000 mg | ORAL_CAPSULE | Freq: Three times a day (TID) | ORAL | 0 refills | Status: DC

## 2018-05-30 NOTE — Patient Instructions (Addendum)
  To decrease swelling in your feet and legs: Elevate feet above slightly bent knees, feet above heart, overnight and 3-4 times per day for 20 minutes.    Peripheral Vascular Disease Peripheral vascular disease (PVD) is a disease of the blood vessels that are not part of your heart and brain. A simple term for PVD is poor circulation. In most cases, PVD narrows the blood vessels that carry blood from your heart to the rest of your body. This can result in a decreased supply of blood to your arms, legs, and internal organs, like your stomach or kidneys. However, it most often affects a person's lower legs and feet. There are two types of PVD.  Organic PVD. This is the more common type. It is caused by damage to the structure of blood vessels.  Functional PVD. This is caused by conditions that make blood vessels contract and tighten (spasm).  Without treatment, PVD tends to get worse over time. PVD can also lead to acute ischemic limb. This is when an arm or limb suddenly has trouble getting enough blood. This is a medical emergency. Follow these instructions at home:  Take medicines only as told by your doctor.  Do not use any tobacco products, including cigarettes, chewing tobacco, or electronic cigarettes. If you need help quitting, ask your doctor.  Lose weight if you are overweight, and maintain a healthy weight as told by your doctor.  Eat a diet that is low in fat and cholesterol. If you need help, ask your doctor.  Exercise regularly. Ask your doctor for some good activities for you.  Take good care of your feet. ? Wear comfortable shoes that fit well. ? Check your feet often for any cuts or sores. Contact a doctor if:  You have cramps in your legs while walking.  You have leg pain when you are at rest.  You have coldness in a leg or foot.  Your skin changes.  You are unable to get or have an erection (erectile dysfunction).  You have cuts or sores on your feet that  are not healing. Get help right away if:  Your arm or leg turns cold and blue.  Your arms or legs become red, warm, swollen, painful, or numb.  You have chest pain or trouble breathing.  You suddenly have weakness in your face, arm, or leg.  You become very confused or you cannot speak.  You suddenly have a very bad headache.  You suddenly cannot see. This information is not intended to replace advice given to you by your health care provider. Make sure you discuss any questions you have with your health care provider. Document Released: 12/15/2009 Document Revised: 02/26/2016 Document Reviewed: 02/28/2014 Elsevier Interactive Patient Education  2017 [employer]  

## 2018-05-30 NOTE — Progress Notes (Signed)
Postoperative Visit   History of Present Illness  Emma Medina is a 47 y.o. female who is s/p exposure of left common femoral artery, left popliteal artery, left anterior tibial artery and left posterior tibial artery, aortogram with left lower extremity angiogram, left lower extremity thromboembolectomy from femoral, popliteal, posterior tibial, and anterior tibial approaches, balloon angioplasty left posterior tibial artery with 3 x 150 mm balloon, and Minx device closure right common femoral artery on 05-18-18 by Dr. Donzetta Matters for acute left lower extremity ischemia.  She had presented that day with acute left lower extremity ischemia that had been present for 5 days with numbness and pain.  She had undergone angiogram which demonstrated possible thrombus existing her left common iliac artery as well as occlusion of her trifurcation below the knee.  She was subsequently indicated for left lower extremity intervention via open approach.  Findings: There was no discernible flow-limiting thrombus in the left common iliac artery on angiogram.  The popliteal artery had an overlying muscle with concern for popliteal entrapment but there is no inherent disease in the artery itself.  There was significant clot removed from the anterior tibial artery from the popliteal approach as well as from the exposure of the anterior tibial artery itself at the ankle.  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  I completion her dominant runoff into her foot is via her posterior tibial artery filling her arch but her anterior tibial artery appears to terminate at the takeoff of the dorsalis pedis proximally.  She has a strong dorsalis pedis and posterior tibial signal at the foot level.  As there was both acute appearing thrombus as well as chronic appearing  occlusive disease in the tibioperoneal trunk we will heparinize her and work her up for source of embolus.  She is scheduled to return on 06-16-18 with CTA chest/abd/pelvis and see Dr. Donzetta Matters.   She returns today after speaking with Dr. Oneida Alar yesterday by phone complaining of some swelling in her below-knee incision on her leg.  She denies any drainage.  Dr. Oneida Alar advised her that we will try to get her an early office visit tomorrow to check her incision.  She was to call him back that evening if she had continued increase in swelling. She denies fever or chills. She has had a "scratchy throat and cough" since the last day of her hospitalization, on 05-23-18.  She tried elevating her legs, which decreased the swelling.   She was taking prednisone for a dry rash on the lateral aspect of her left leg and buttock which she states was present prior to hospitalization, had to stop this when hospitalized. The prednisone was decreasing the rash. Pt states the rash is not painful, but is pruritic.  She states she had chicken pox years ago, when her child had it.   The patient's left leg incisions are healing.  The patient notes resolution of lower extremity symptoms.  The patient is able to complete her activities of daily living.   She quit smoking on 07-02-16.  She does not have DM.     For VQI Use Only  PRE-ADM LIVING: Home  AMB STATUS: Ambulatory   Past Medical History:  Diagnosis Date  . Hypertension   . Obstructive thrombus   . Sciatica     Past Surgical History:  Procedure Laterality Date  .  ABDOMINAL AORTOGRAM N/A 05/18/2018   Procedure: ABDOMINAL AORTOGRAM;  Surgeon: Waynetta Sandy, MD;  Location: [facility];  Service: Cardiovascular;  Laterality: N/A;  . LOWER EXTREMITY ANGIOGRAPHY Bilateral 05/18/2018   Procedure: LOWER EXTREMITY ANGIOGRAPHY;  Surgeon: Waynetta Sandy, MD;  Location: [facility];  Service: Cardiovascular;  Laterality: Bilateral;   . THROMBECTOMY FEMORAL ARTERY Left 05/18/2018   Procedure: LEFT LEG THROMBECTOMY, BALLOON ANGIOPLASTY LEFT POSTERIOR TIBIAL ARTERY;  Surgeon: Waynetta Sandy, MD;  Location: [facility];  Service: Vascular;  Laterality: Left;  . TUBAL LIGATION      Family History  Problem Relation Age of Onset  . Cancer Mother        brain and uterine  . Stroke Brother   . Diabetes Brother   . Breast cancer Neg Hx     Social History   Socioeconomic History  . Marital status: Single    Spouse name: Not on file  . Number of children: Not on file  . Years of education: Not on file  . Highest education level: Not on file  Occupational History  . Not on file  Social Needs  . Financial resource strain: Not on file  . Food insecurity:    Worry: Not on file    Inability: Not on file  . Transportation needs:    Medical: Not on file    Non-medical: Not on file  Tobacco Use  . Smoking status: Former Smoker    Packs/day: 1.00    Last attempt to quit: 07/02/2016    Years since quitting: 1.9  . Smokeless tobacco: Never Used  Substance and Sexual Activity  . Alcohol use: No  . Drug use: Yes    Frequency: 7.0 times per week    Types: Marijuana    Comment: smokes blunt on occasion  . Sexual activity: Not on file  Lifestyle  . Physical activity:    Days per week: Not on file    Minutes per session: Not on file  . Stress: Not on file  Relationships  . Social connections:    Talks on phone: Not on file    Gets together: Not on file    Attends religious service: Not on file    Active member of club or organization: Not on file    Attends meetings of clubs or organizations: Not on file    Relationship status: Not on file  . Intimate partner violence:    Fear of current or ex partner: Not on file    Emotionally abused: Not on file    Physically abused: Not on file    Forced sexual activity: Not on file  Other Topics Concern  . Not on file  Social History Narrative  . Not on file     No Known Allergies  Current Outpatient Medications on File Prior to Visit  Medication Sig Dispense Refill  . aspirin EC 81 MG EC tablet Take 1 tablet (81 mg total) by mouth daily.    . Cyclobenzaprine HCl (FLEXERIL PO) Take 10 mg by mouth 2 (two) times daily.     Marland Kitchen oxyCODONE-acetaminophen (PERCOCET/ROXICET) 5-325 MG tablet Take 1 tablet by mouth every 4 (four) hours as needed for moderate pain. 30 tablet 0  . Rivaroxaban 15 & 20 MG TBPK Take as directed on package: Start with one 15mg  tablet by mouth twice a day with food. On Day 22, switch to one 20mg  tablet once a day with food. 51 each 0  .  rosuvastatin (CRESTOR) 10 MG tablet Take 1 tablet (10 mg total) by mouth daily. (Patient not taking: Reported on 05/30/2018) 30 tablet 3   No current facility-administered medications on file prior to visit.       Physical Examination  Vitals:   05/30/18 1117  BP: 123/82  Pulse: 99  Resp: 18  Temp: 98.2 F (36.8 C)  TempSrc: Oral  SpO2: 98%  Weight: 238 lb (108 kg)  Height: 5\' 6"  (1.676 m)   Body mass index is 38.41 kg/m.  PHYSICAL EXAMINATION: General: The patient appears herr stated age. Obese female HEENT:  No gross abnormalities Pulmonary: Respirations are non-labored, CTAB Abdomen: Soft and non-tender. Left groin incision edges well proximated, surgical glue peeling off, small hematoma palpated underneath incision. Musculoskeletal: There are no major deformities.  Staples in place adjacent and distal to left knee with moderate inflammatory reaction, and at 2 incisions at left ankle. (see photos below) Neurologic: No focal weakness or paresthesias are detected, Skin: Dry irregular rash at lateral aspect left leg, see photo below. Psychiatric: The patient has normal affect. Cardiovascular: There is a regular rate and rhythm without significant murmur appreciated.   Vascular: Vessel Right Left  Radial Palpable Palpable  Aorta Not palpable N/A  Femoral 1+Palpable Not  Palpable  Popliteal Not palpable Not palpable  PT Not  Palpable not Palpable, weak Doppler signal  DP 1+ Palpable 1+ Palpable, brisk Doppler signal  PERONEAL Not palpable Brisk Doppler signal           Medical Decision Making  MIKALIA FESSEL is a 47 y.o. female who presents s/p  exposure of left common femoral artery, left popliteal artery, left anterior tibial artery and left posterior tibial artery, aortogram with left lower extremity angiogram, left lower extremity thromboembolectomy from femoral, popliteal, posterior tibial, and anterior tibial approaches, balloon angioplasty left posterior tibial artery with 3 x 150 mm balloon, and Minx device closure right common femoral artery on 05-18-18 by Dr. Donzetta Matters for acute left lower extremity ischemia.  She returns today with concern re mild swelling and pain, mild erythema, at incision adjacent to and distal to left knee.  She has no fever or chills. The swelling in her left leg decreases with elevation of her leg.    I discussed with Dr. Donnetta Hutching pt HPI and physical exam results, possible inflammatory reaction to staples. Will prescribe Keflex 500 mg po tidx 7 days. Continue to elevate legs above her heart overnight and for 20 minutes, 3-4x/day. Continue to gradually increase walking.  Return in 1-2 weeks to see Dr. Donzetta Matters for re-evaluation of incisions, and possible staples removal.  Pt is already scheduled for CTA chest/abd/pelvis on 06-16-18 and see Dr. Donzetta Matters after.    Thank you for allowing [place] to participate in this patient's care.  Clemon Chambers, RN, MSN, FNP-C [facility] Office: [phone]  05/30/2018, 11:19 AM  Clinic MD: [facility]

## 2018-06-16 ENCOUNTER — Other Ambulatory Visit: Payer: Self-pay

## 2018-06-16 ENCOUNTER — Ambulatory Visit
Admission: RE | Admit: 2018-06-16 | Discharge: 2018-06-16 | Disposition: A | Discharge status: Home / Self Care | Payer: Self-pay | Source: Ambulatory Visit | Attending: Vascular Surgery | Admitting: Vascular Surgery

## 2018-06-16 ENCOUNTER — Ambulatory Visit (INDEPENDENT_AMBULATORY_CARE_PROVIDER_SITE_OTHER): Payer: Self-pay | Admitting: Vascular Surgery

## 2018-06-16 ENCOUNTER — Encounter: Payer: Self-pay | Admitting: Vascular Surgery

## 2018-06-16 VITALS — BP 140/101 | HR 81 | Temp 96.8°F | Resp 16 | Ht 66.0 in | Wt 238.0 lb

## 2018-06-16 DIAGNOSIS — I8290 Acute embolism and thrombosis of unspecified vein: Secondary | ICD-10-CM

## 2018-06-16 DIAGNOSIS — I82499 Acute embolism and thrombosis of other specified deep vein of unspecified lower extremity: Secondary | ICD-10-CM

## 2018-06-16 DIAGNOSIS — I998 Other disorder of circulatory system: Secondary | ICD-10-CM

## 2018-06-16 DIAGNOSIS — I779 Disorder of arteries and arterioles, unspecified: Secondary | ICD-10-CM

## 2018-06-16 MED ORDER — IOPAMIDOL (ISOVUE-370) INJECTION 76%
75.0000 mL | Freq: Once | INTRAVENOUS | Status: AC | PRN
  Administered 2018-06-16: 75 mL via INTRAVENOUS

## 2018-06-16 NOTE — Progress Notes (Signed)
    Subjective:     Patient ID: Emma Medina, female   DOB: 1970-10-29, 47 y.o.   MRN: [id number]  HPI 47 year old female follows up after hospitalization for hospitalization for left lower extremity acute limb ischemia that had been present for 5 days with an ABI of 0.6 relative to one on the contralateral side.  She underwent iliac thrombectomy with anterior tibial as well and had posterior tibial ballooning for what appeared to be chronic disease.  States that she still has numbness in her toes that existed prior to surgery.  She remains on aspirin and Xarelto   Review of Systems Left toe numbness    Objective:   Physical Exam Awake alert oriented Well-healed left groin incision Staples removed from below-knee popliteal exposure incision as well as anterior tibial and posterior tibial artery incisions Dorsalis pedis signal with her strongest in her foot Her left foot is warm and well-perfused    Assessment:     47 year old female recently hospitalized with acute limb ischemia with thrombus in her left common iliac artery.  She was to get CTA today to evaluate for more proximal source with a could not find any IV.  She is now scheduled for Monday to have this procedure performed.    Plan:     Follow-up CT scan on Monday if there are any issues I will need to call her but she is already anticoagulated. She will follow-up in 3 months with ABIs. Possible she will need formal angiography and intervention of her left lower extremity in the future.  Eliaz Fout C. Donzetta Matters, MD [facility] Office: [phone] Pager: [phone]

## 2018-06-16 NOTE — Progress Notes (Signed)
Vitals:   06/16/18 1400  BP: (!) 147/105  Pulse: 82  Resp: 16  Temp: (!) 96.8 F (36 C)  TempSrc: Oral  SpO2: 91%  Weight: 238 lb (108 kg)  Height: 5\' 6"  (1.676 m)

## 2018-06-19 ENCOUNTER — Other Ambulatory Visit: Payer: Self-pay

## 2018-06-19 DIAGNOSIS — I779 Disorder of arteries and arterioles, unspecified: Secondary | ICD-10-CM

## 2018-06-23 ENCOUNTER — Ambulatory Visit: Payer: Self-pay | Admitting: Vascular Surgery

## 2018-06-26 ENCOUNTER — Ambulatory Visit (HOSPITAL_COMMUNITY)
Admission: RE | Admit: 2018-06-26 | Discharge: 2018-06-26 | Disposition: A | Discharge status: Home / Self Care | Payer: Self-pay | Source: Ambulatory Visit | Attending: Vascular Surgery | Admitting: Vascular Surgery

## 2018-06-26 DIAGNOSIS — K8020 Calculus of gallbladder without cholecystitis without obstruction: Secondary | ICD-10-CM | POA: Insufficient documentation

## 2018-06-26 DIAGNOSIS — R911 Solitary pulmonary nodule: Secondary | ICD-10-CM | POA: Insufficient documentation

## 2018-06-26 DIAGNOSIS — K429 Umbilical hernia without obstruction or gangrene: Secondary | ICD-10-CM | POA: Insufficient documentation

## 2018-06-26 DIAGNOSIS — I8290 Acute embolism and thrombosis of unspecified vein: Secondary | ICD-10-CM | POA: Insufficient documentation

## 2018-06-26 MED ORDER — IOPAMIDOL (ISOVUE-370) INJECTION 76%
100.0000 mL | Freq: Once | INTRAVENOUS | Status: AC | PRN
  Administered 2018-06-26: 100 mL via INTRAVENOUS

## 2018-07-05 ENCOUNTER — Encounter: Payer: Self-pay | Admitting: *Deleted

## 2018-07-14 ENCOUNTER — Other Ambulatory Visit: Payer: Self-pay

## 2018-07-14 ENCOUNTER — Ambulatory Visit (INDEPENDENT_AMBULATORY_CARE_PROVIDER_SITE_OTHER): Payer: Self-pay | Admitting: Vascular Surgery

## 2018-07-14 ENCOUNTER — Encounter: Payer: Self-pay | Admitting: Vascular Surgery

## 2018-07-14 VITALS — BP 113/82 | HR 83 | Temp 98.8°F | Resp 20 | Ht 66.0 in | Wt 235.0 lb

## 2018-07-14 DIAGNOSIS — I779 Disorder of arteries and arterioles, unspecified: Secondary | ICD-10-CM

## 2018-07-14 NOTE — Progress Notes (Signed)
    Subjective:     Patient ID: NEKAYLA HEIDER, female   DOB: 1971-08-24, 47 y.o.   MRN: [id number]  HPI 47 year old female follows up for evaluation of thoracic aorta with CT angios.  She is now walking numbness has improved in her foot.  She is not taking Xarelto but is on aspirin.  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   Review of Systems Left foot numbness    Objective:   Physical Exam aaox3 Non labored respirations Abdomen is soft 1+ left femoral pulse Palpable right dorsalis pedis and posterior tibial pulses    I have independently reviewed her CT scan which demonstrates residual thrombus in her left common iliac artery without any source noted in her thoracic aorta    Assessment:     47 year old female follows up with CT scan that could not be performed prior to her last visit.  Her left foot numbness is now much improved and she is perfusing her foot well.    Plan:     Plan will be to follow-up in 2 months with ABIs and left lower extremity duplex.  I would not intervene on the residual thrombus in her left common iliac artery as this is chronic at this time.  She is on aspirin she is attempting to get her Eliquis refill but it is relatively cost prohibitive at this time.  As long she takes aspirin daily this is probably enough.  We will evaluate her flow in her left foot in the near future but currently she is asymptomatic and does not need intervention.  Sayda Grable C. Donzetta Matters, MD [facility] Office: [phone] Pager: [phone]

## 2018-07-28 ENCOUNTER — Other Ambulatory Visit: Payer: Self-pay | Admitting: *Deleted

## 2018-07-28 DIAGNOSIS — I779 Disorder of arteries and arterioles, unspecified: Secondary | ICD-10-CM

## 2018-08-02 ENCOUNTER — Other Ambulatory Visit: Payer: Self-pay | Admitting: Vascular Surgery

## 2018-08-02 ENCOUNTER — Other Ambulatory Visit: Payer: Self-pay | Admitting: Surgery

## 2018-08-02 MED ORDER — XARELTO 20 MG PO TABS: 20.0000 mg | ORAL_TABLET | Freq: Every day | ORAL | 5 refills | Status: DC

## 2018-08-02 MED ORDER — RIVAROXABAN (XARELTO) VTE STARTER PACK (15 & 20 MG): ORAL_TABLET | ORAL | 5 refills | Status: DC

## 2018-09-05 ENCOUNTER — Emergency Department (HOSPITAL_COMMUNITY)
Admission: EM | Admit: 2018-09-05 | Discharge: 2018-09-05 | Disposition: A | Discharge status: Home / Self Care | Payer: Self-pay | Attending: Emergency Medicine | Admitting: Emergency Medicine

## 2018-09-05 ENCOUNTER — Encounter (HOSPITAL_COMMUNITY): Payer: Self-pay | Admitting: *Deleted

## 2018-09-05 ENCOUNTER — Other Ambulatory Visit: Payer: Self-pay

## 2018-09-05 DIAGNOSIS — I10 Essential (primary) hypertension: Secondary | ICD-10-CM | POA: Insufficient documentation

## 2018-09-05 DIAGNOSIS — R21 Rash and other nonspecific skin eruption: Secondary | ICD-10-CM | POA: Insufficient documentation

## 2018-09-05 DIAGNOSIS — Z7982 Long term (current) use of aspirin: Secondary | ICD-10-CM | POA: Insufficient documentation

## 2018-09-05 DIAGNOSIS — Z7901 Long term (current) use of anticoagulants: Secondary | ICD-10-CM | POA: Insufficient documentation

## 2018-09-05 DIAGNOSIS — Z87891 Personal history of nicotine dependence: Secondary | ICD-10-CM | POA: Insufficient documentation

## 2018-09-05 DIAGNOSIS — Z79899 Other long term (current) drug therapy: Secondary | ICD-10-CM | POA: Insufficient documentation

## 2018-09-05 MED ORDER — HYDROXYZINE HCL 25 MG PO TABS
50.0000 mg | ORAL_TABLET | Freq: Once | ORAL | Status: AC
  Administered 2018-09-05: 50 mg via ORAL
  Filled 2018-09-05: qty 2

## 2018-09-05 MED ORDER — HYDROCORTISONE 2.5 % EX LOTN: TOPICAL_LOTION | Freq: Two times a day (BID) | CUTANEOUS | 0 refills | Status: DC

## 2018-09-05 MED ORDER — HYDROXYZINE HCL 25 MG PO TABS: 50.0000 mg | ORAL_TABLET | Freq: Four times a day (QID) | ORAL | 0 refills | Status: DC | PRN

## 2018-09-05 NOTE — Discharge Instructions (Signed)
1.  Your rash has an appearance similar to psoriasis.  However, it is not certain that your rash is psoriasis.  You must be seen by dermatologist for further evaluation.  You may require a biopsy for a final diagnosis. 2.  Try cortisone lotion sparingly twice a day as prescribed.  You may take Atarax for itching. 3.  Work with your primary doctor to be seen by dermatologist as soon as possible.

## 2018-09-05 NOTE — ED Triage Notes (Signed)
Pt reports pruritic, raised, and dry rash on her LLE since this summer.  Pt have seen her PCP and was given a cream without relief.  She reports the only thing that helped is when she had to take PO prednisone for 3 days.  Rash have spread up to her posterior thigh.  She has also tried hydrocortisone cream without relief.  Denies any fever or pain.

## 2018-09-05 NOTE — ED Provider Notes (Signed)
[facility] EMERGENCY DEPARTMENT Provider Note   CSN: [id number] Arrival date & time: 09/05/18  1939     History   Chief Complaint Chief Complaint  Patient presents with  . Rash    HPI Emma Medina is a 47 y.o. female.  HPI Patient reports she has been having this itchy thick rash since summertime.  It has stayed confined to the lateral left lower leg.  She reports at one point she did get some improvement after being on a course of prednisone.  She has no idea what started the rash.  She has had a history of a DVT.  She had thrombectomy.  She reports however the rash was preexistent.  She is presenting today chiefly because it is bothering her due to itching.  She denies any associated fevers, chills, chest pain, shortness of breath Past Medical History:  Diagnosis Date  . Hypertension   . Obstructive thrombus   . Sciatica     Patient Active Problem List   Diagnosis Date Noted  . Obstructive thrombus 05/18/2018  . Ischemia of extremity 05/18/2018    Past Surgical History:  Procedure Laterality Date  . ABDOMINAL AORTOGRAM N/A 05/18/2018   Procedure: ABDOMINAL AORTOGRAM;  Surgeon: Waynetta Sandy, MD;  Location: [facility];  Service: Cardiovascular;  Laterality: N/A;  . LOWER EXTREMITY ANGIOGRAPHY Bilateral 05/18/2018   Procedure: LOWER EXTREMITY ANGIOGRAPHY;  Surgeon: Waynetta Sandy, MD;  Location: [facility];  Service: Cardiovascular;  Laterality: Bilateral;  . THROMBECTOMY FEMORAL ARTERY Left 05/18/2018   Procedure: LEFT LEG THROMBECTOMY, BALLOON ANGIOPLASTY LEFT POSTERIOR TIBIAL ARTERY;  Surgeon: Waynetta Sandy, MD;  Location: [facility];  Service: Vascular;  Laterality: Left;  . TUBAL LIGATION       OB History    Gravida  8   Para  8   Term  8   Preterm      AB      Living  8     SAB      TAB      Ectopic      Multiple      Live Births  8            Home Medications    Prior  to Admission medications   Medication Sig Start Date End Date Taking? Authorizing Provider  aspirin EC 81 MG EC tablet Take 1 tablet (81 mg total) by mouth daily. 05/23/18   Ulyses Amor, PA-C  Cyclobenzaprine HCl (FLEXERIL PO) Take 10 mg by mouth 2 (two) times daily.     [provider]  hydrocortisone 2.5 % lotion Apply topically 2 (two) times daily. 09/05/18   Charlesetta Shanks, MD  hydrOXYzine (ATARAX/VISTARIL) 25 MG tablet Take 2 tablets (50 mg total) by mouth every 6 (six) hours as needed for itching. 09/05/18   Charlesetta Shanks, MD  XARELTO 20 MG TABS tablet Take 1 tablet (20 mg total) by mouth daily with supper. 08/02/18   Serafina Mitchell, MD    Family History Family History  Problem Relation Age of Onset  . Cancer Mother        brain and uterine  . Stroke Brother   . Diabetes Brother   . Breast cancer Neg Hx     Social History Social History   Tobacco Use  . Smoking status: Former Smoker    Packs/day: 1.00    Last attempt to quit: 07/02/2016    Years since quitting: 2.1  .  Smokeless tobacco: Never Used  Substance Use Topics  . Alcohol use: No  . Drug use: Yes    Frequency: 7.0 times per week    Types: Marijuana    Comment: smokes blunt on occasion     Allergies   Patient has no known allergies.   Review of Systems Review of Systems 10 Systems reviewed and are negative for acute change except as noted in the HPI.   Physical Exam Updated Vital Signs BP 101/88 (BP Location: Right Arm)   Pulse 66   Temp 98 F (36.7 C) (Oral)   Resp 18   LMP 08/15/2018   SpO2 99%   Physical Exam  Constitutional: She is oriented to person, place, and time.  Alert and nontoxic.  No respiratory distress.  Clinically well in appearance.  HENT:  Head: Normocephalic and atraumatic.  Nose: Nose normal.  Mouth/Throat: Oropharynx is clear and moist.  Eyes: Conjunctivae and EOM are normal.  Cardiovascular: Normal rate, regular rhythm, normal heart sounds and intact  distal pulses.  Pulmonary/Chest: Effort normal and breath sounds normal.  Abdominal: Soft. She exhibits no distension. There is no tenderness.  Musculoskeletal: Normal range of motion. She exhibits no edema or tenderness.  Neurological: She is alert and oriented to person, place, and time. She exhibits normal muscle tone. Coordination normal.  Skin: Skin is warm and dry. Rash noted.  Psychiatric: She has a normal mood and affect.           ED Treatments / Results  Labs (all labs ordered are listed, but only abnormal results are displayed) Labs Reviewed - No data to display  EKG None  Radiology No results found.  Procedures Procedures (including critical care time)  Medications Ordered in ED Medications  hydrOXYzine (ATARAX/VISTARIL) tablet 50 mg (50 mg Oral Given 09/05/18 2308)     Initial Impression / Assessment and Plan / ED Course  I have reviewed the triage vital signs and the nursing notes.  Pertinent labs & imaging results that were available during my care of the patient were reviewed by me and considered in my medical decision making (see chart for details).    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  However, patient has no other typical areas of rash.  Plan it is unclear what the etiology is.  Patient did report some improvement when on steroids.  Will have the patient do a topical hydrocortisone and Atarax for itching.  Patient has seen her PCP for this and is aware that she really needs consultation with dermatology for definitive diagnosis and improved treatment.  Rashes nonacute.  Patient is otherwise medically well.  Stable for further outpatient work-up.  Final Clinical Impressions(s) / ED Diagnoses   Final diagnoses:  Rash    ED Discharge Orders         Ordered    hydrOXYzine (ATARAX/VISTARIL) 25 MG tablet  Every 6 hours PRN     09/05/18 2251    hydrocortisone 2.5 % lotion  2 times daily     09/05/18 2251           Charlesetta Shanks,  MD 09/08/18 [phone]

## 2018-09-15 ENCOUNTER — Ambulatory Visit: Payer: Self-pay | Admitting: Vascular Surgery

## 2018-09-15 ENCOUNTER — Encounter: Payer: Self-pay | Admitting: *Deleted

## 2018-09-15 ENCOUNTER — Encounter (HOSPITAL_COMMUNITY): Payer: Self-pay

## 2018-09-15 ENCOUNTER — Encounter: Payer: Self-pay | Admitting: Vascular Surgery

## 2018-09-15 ENCOUNTER — Other Ambulatory Visit: Payer: Self-pay

## 2018-09-15 ENCOUNTER — Ambulatory Visit (HOSPITAL_COMMUNITY)
Admission: RE | Admit: 2018-09-15 | Discharge: 2018-09-15 | Disposition: A | Discharge status: Home / Self Care | Payer: Self-pay | Source: Ambulatory Visit | Attending: Vascular Surgery | Admitting: Vascular Surgery

## 2018-09-15 ENCOUNTER — Ambulatory Visit (INDEPENDENT_AMBULATORY_CARE_PROVIDER_SITE_OTHER)
Admission: RE | Admit: 2018-09-15 | Discharge: 2018-09-15 | Disposition: A | Discharge status: Home / Self Care | Payer: Self-pay | Source: Ambulatory Visit | Attending: Vascular Surgery | Admitting: Vascular Surgery

## 2018-09-15 ENCOUNTER — Ambulatory Visit (INDEPENDENT_AMBULATORY_CARE_PROVIDER_SITE_OTHER): Payer: Self-pay | Admitting: Vascular Surgery

## 2018-09-15 VITALS — BP 109/80 | HR 91 | Resp 16 | Ht 66.0 in | Wt 237.5 lb

## 2018-09-15 DIAGNOSIS — I779 Disorder of arteries and arterioles, unspecified: Secondary | ICD-10-CM

## 2018-09-15 NOTE — Progress Notes (Signed)
Patient ID: Emma Medina, female   DOB: [birth year], 47 y.o.   MRN: [id number]  Reason for Consult: Follow-up (3 month f/u)   Referred by Placey, Audrea Muscat, NP  Subjective:     HPI:  Emma Medina is a 47 y.o. female underwent left lower extremity thromboembolectomy for acute limb ischemia as well as balloon angioplasty of the posterior tibial artery.  She is now on Xarelto and aspirin.  She has healed her wounds well but she has persistent pain in her posterior tibial access site on the left and also her left great toe is numb as is the dorsum of her foot.  She is able to walk without much limitation she does not have any wounds at this time.  Past Medical History:  Diagnosis Date  . Hypertension   . Obstructive thrombus   . Sciatica    Family History  Problem Relation Age of Onset  . Cancer Mother        brain and uterine  . Stroke Brother   . Diabetes Brother   . Breast cancer Neg Hx    Past Surgical History:  Procedure Laterality Date  . ABDOMINAL AORTOGRAM N/A 05/18/2018   Procedure: ABDOMINAL AORTOGRAM;  Surgeon: Waynetta Sandy, MD;  Location: [place] [facility];  Service: Cardiovascular;  Laterality: N/A;  . LOWER EXTREMITY ANGIOGRAPHY Bilateral 05/18/2018   Procedure: LOWER EXTREMITY ANGIOGRAPHY;  Surgeon: Waynetta Sandy, MD;  Location: [place] [facility];  Service: Cardiovascular;  Laterality: Bilateral;  . THROMBECTOMY FEMORAL ARTERY Left 05/18/2018   Procedure: LEFT LEG THROMBECTOMY, BALLOON ANGIOPLASTY LEFT POSTERIOR TIBIAL ARTERY;  Surgeon: Waynetta Sandy, MD;  Location: [place];  Service: Vascular;  Laterality: Left;  . TUBAL LIGATION      Short Social History:  Social History   Tobacco Use  . Smoking status: Former Smoker    Packs/day: 1.00    Last attempt to quit: 07/02/2016    Years since quitting: 2.2  . Smokeless tobacco: Never Used  Substance Use Topics  . Alcohol use: No    No Known Allergies  Current Outpatient  Medications  Medication Sig Dispense Refill  . aspirin EC 81 MG EC tablet Take 1 tablet (81 mg total) by mouth daily.    . Cyclobenzaprine HCl (FLEXERIL PO) Take 10 mg by mouth 2 (two) times daily.     . hydrocortisone 2.5 % lotion Apply topically 2 (two) times daily. 118 mL 0  . hydrOXYzine (ATARAX/VISTARIL) 25 MG tablet Take 2 tablets (50 mg total) by mouth every 6 (six) hours as needed for itching. 30 tablet 0  . XARELTO 20 MG TABS tablet Take 1 tablet (20 mg total) by mouth daily with supper. 30 tablet 5   No current facility-administered medications for this visit.     Review of Systems  HENT: HENT negative.  Eyes: Eyes negative.  Respiratory: Positive for shortness of breath.  GI: Gastrointestinal negative.  Musculoskeletal: Positive for leg pain.  Skin: Negative for rash and wound.  Neurological: Neurological negative. Hematologic: Hematologic/lymphatic negative.  Psychiatric: Psychiatric negative.        Objective:  Objective   Vitals:   09/15/18 1450  BP: 109/80  Pulse: 91  Resp: 16  SpO2: 99%  Weight: 237 lb 8 oz (107.7 kg)  Height: 5\' 6"  (1.676 m)   Body mass index is 38.33 kg/m.  Physical Exam HENT:     Head: Normocephalic.  Eyes:     Pupils: Pupils are equal,  round, and reactive to light.  Neck:     Musculoskeletal: Neck supple.  Cardiovascular:     Rate and Rhythm: Normal rate.     Pulses:          Radial pulses are 2+ on the right side and 2+ on the left side.       Femoral pulses are 2+ on the right side and 2+ on the left side.      Dorsalis pedis pulses are 2+ on the right side and 0 on the left side.       Posterior tibial pulses are 0 on the left side.     Comments: Weak posterior tibial signal on the left Pulmonary:     Effort: Pulmonary effort is normal.  Abdominal:     Palpations: Abdomen is soft.  Skin:    General: Skin is warm and dry.  Neurological:     General: No focal deficit present.     Mental Status: She is alert and  oriented to person, place, and time.  Psychiatric:        Mood and Affect: Mood normal.        Behavior: Behavior normal.        Thought Content: Thought content normal.        Judgment: Judgment normal.     Data: I have independently interpreted her ABIs to be 1.3 right and 1.04 left on the left she has a toe pressure of 0  I have also independently interpreted her left lower extremity duplex which demonstrates triphasic waveforms to the PT artery proximally and then it is occluded reconstitutes distally via collaterals.     Assessment/Plan:     47 year old female follows up after left lower extremity thromboembolectomy.  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  Given that this is now chronic process I think we should begin with angiogram to see if we can open up her posterior tibial artery.  We will get her scheduled for the very near future we will hold Xarelto for 2 days prior.     Waynetta Sandy MD [facility]

## 2018-09-19 ENCOUNTER — Other Ambulatory Visit: Payer: Self-pay | Admitting: *Deleted

## 2018-09-20 ENCOUNTER — Emergency Department (HOSPITAL_COMMUNITY)
Admission: EM | Admit: 2018-09-20 | Discharge: 2018-09-20 | Disposition: A | Discharge status: Home / Self Care | Payer: Self-pay | Attending: Emergency Medicine | Admitting: Emergency Medicine

## 2018-09-20 ENCOUNTER — Encounter (HOSPITAL_COMMUNITY): Payer: Self-pay | Admitting: Emergency Medicine

## 2018-09-20 ENCOUNTER — Emergency Department (HOSPITAL_COMMUNITY): Payer: Self-pay

## 2018-09-20 ENCOUNTER — Other Ambulatory Visit: Payer: Self-pay

## 2018-09-20 DIAGNOSIS — Z86718 Personal history of other venous thrombosis and embolism: Secondary | ICD-10-CM | POA: Insufficient documentation

## 2018-09-20 DIAGNOSIS — Z7901 Long term (current) use of anticoagulants: Secondary | ICD-10-CM | POA: Insufficient documentation

## 2018-09-20 DIAGNOSIS — R059 Cough, unspecified: Secondary | ICD-10-CM

## 2018-09-20 DIAGNOSIS — Z87891 Personal history of nicotine dependence: Secondary | ICD-10-CM | POA: Insufficient documentation

## 2018-09-20 DIAGNOSIS — N938 Other specified abnormal uterine and vaginal bleeding: Secondary | ICD-10-CM | POA: Insufficient documentation

## 2018-09-20 DIAGNOSIS — N939 Abnormal uterine and vaginal bleeding, unspecified: Secondary | ICD-10-CM

## 2018-09-20 DIAGNOSIS — I10 Essential (primary) hypertension: Secondary | ICD-10-CM | POA: Insufficient documentation

## 2018-09-20 DIAGNOSIS — F1290 Cannabis use, unspecified, uncomplicated: Secondary | ICD-10-CM | POA: Insufficient documentation

## 2018-09-20 DIAGNOSIS — R05 Cough: Secondary | ICD-10-CM | POA: Insufficient documentation

## 2018-09-20 DIAGNOSIS — R0981 Nasal congestion: Secondary | ICD-10-CM | POA: Insufficient documentation

## 2018-09-20 LAB — COMPREHENSIVE METABOLIC PANEL
ALT: 12 U/L (ref 0–44)
AST: 19 U/L (ref 15–41)
Albumin: 3.5 g/dL (ref 3.5–5.0)
Alkaline Phosphatase: 66 U/L (ref 38–126)
Anion gap: 13 (ref 5–15)
BUN: <5 mg/dL — ABNORMAL LOW (ref 6–20)
CO2: 25 mmol/L (ref 22–32)
CREATININE: 0.84 mg/dL (ref 0.44–1.00)
Calcium: 8.8 mg/dL — ABNORMAL LOW (ref 8.9–10.3)
Chloride: 101 mmol/L (ref 98–111)
GFR calc Af Amer: >60 mL/min (ref >60)
GFR calc non Af Amer: >60 mL/min (ref >60)
Glucose, Bld: 92 mg/dL (ref 70–99)
Potassium: 2.8 mmol/L — ABNORMAL LOW (ref 3.5–5.1)
Sodium: 139 mmol/L (ref 135–145)
Total Bilirubin: 0.4 mg/dL (ref 0.3–1.2)
Total Protein: 7.6 g/dL (ref 6.5–8.1)

## 2018-09-20 LAB — CBC
HCT: 33.2 % — ABNORMAL LOW (ref 36.0–46.0)
Hemoglobin: 10 g/dL — ABNORMAL LOW (ref 12.0–15.0)
MCH: 21.2 pg — AB (ref 26.0–34.0)
MCHC: 30.1 g/dL (ref 30.0–36.0)
MCV: 70.3 fL — ABNORMAL LOW (ref 80.0–100.0)
Platelets: 418 10*3/uL — ABNORMAL HIGH (ref 150–400)
RBC: 4.72 MIL/uL (ref 3.87–5.11)
RDW: 19.9 % — ABNORMAL HIGH (ref 11.5–15.5)
WBC: 6.9 10*3/uL (ref 4.0–10.5)
nRBC: 0 % (ref 0.0–0.2)

## 2018-09-20 LAB — PROTIME-INR
INR: 1.72
Prothrombin Time: 19.9 seconds — ABNORMAL HIGH (ref 11.4–15.2)

## 2018-09-20 LAB — APTT: aPTT: 42 seconds — ABNORMAL HIGH (ref 24–36)

## 2018-09-20 LAB — POC URINE PREG, ED: Preg Test, Ur: NEGATIVE

## 2018-09-20 MED ORDER — ONDANSETRON 4 MG PO TBDP
4.0000 mg | ORAL_TABLET | Freq: Once | ORAL | Status: AC
  Administered 2018-09-20: 4 mg via ORAL
  Filled 2018-09-20: qty 1

## 2018-09-20 MED ORDER — ACETAMINOPHEN 325 MG PO TABS
650.0000 mg | ORAL_TABLET | Freq: Once | ORAL | Status: DC
  Filled 2018-09-20: qty 2

## 2018-09-20 MED ORDER — NORETHINDRONE ACETATE 5 MG PO TABS: 5.0000 mg | ORAL_TABLET | Freq: Two times a day (BID) | ORAL | 0 refills | Status: DC

## 2018-09-20 MED ORDER — SODIUM CHLORIDE 0.9 % IV BOLUS
1000.0000 mL | Freq: Once | INTRAVENOUS | Status: AC
  Administered 2018-09-20: 1000 mL via INTRAVENOUS

## 2018-09-20 MED ORDER — POTASSIUM CHLORIDE CRYS ER 20 MEQ PO TBCR
40.0000 meq | EXTENDED_RELEASE_TABLET | Freq: Once | ORAL | Status: AC
  Administered 2018-09-20: 40 meq via ORAL
  Filled 2018-09-20: qty 2

## 2018-09-20 NOTE — ED Provider Notes (Signed)
[facility] Emergency Department Provider Note MRN:  [id number]  Arrival date & time: 09/20/18     Chief Complaint   Vaginal Bleeding   History of Present Illness   Emma Medina is a 47 y.o. year-old female with a history of DVT presenting to the ED with chief complaint of vaginal bleeding.  Patient has had irregular periods for 2 months, ever since starting a blood thinner for her recent DVT.  Very heavy bleeding for the past 2 to 3 days, passing clots, soaking 2-3 pads per day.  Denies dizziness or lightheadedness, no headache or vision change.  5 to 6 days of cold-like symptoms, nasal congestion, cough.  Endorsing subjective fever last night.  More recent shortness of breath with the cough.  Denies chest pain, no abdominal pain, no dysuria.  Review of Systems  A complete 10 system review of systems was obtained and all systems are negative except as noted in the HPI and PMH.   Patient's Health History    Past Medical History:  Diagnosis Date  . Hypertension   . Obstructive thrombus   . Sciatica     Past Surgical History:  Procedure Laterality Date  . ABDOMINAL AORTOGRAM N/A 05/18/2018   Procedure: ABDOMINAL AORTOGRAM;  Surgeon: Waynetta Sandy, MD;  Location: [place] [facility];  Service: Cardiovascular;  Laterality: N/A;  . LOWER EXTREMITY ANGIOGRAPHY Bilateral 05/18/2018   Procedure: LOWER EXTREMITY ANGIOGRAPHY;  Surgeon: Waynetta Sandy, MD;  Location: [place] [facility];  Service: Cardiovascular;  Laterality: Bilateral;  . THROMBECTOMY FEMORAL ARTERY Left 05/18/2018   Procedure: LEFT LEG THROMBECTOMY, BALLOON ANGIOPLASTY LEFT POSTERIOR TIBIAL ARTERY;  Surgeon: Waynetta Sandy, MD;  Location: [place];  Service: Vascular;  Laterality: Left;  . TUBAL LIGATION      Family History  Problem Relation Age of Onset  . Cancer Mother        brain and uterine  . Stroke Brother   . Diabetes Brother   . Breast cancer Neg Hx     Social  History   Socioeconomic History  . Marital status: Single    Spouse name: Not on file  . Number of children: Not on file  . Years of education: Not on file  . Highest education level: Not on file  Occupational History  . Not on file  Social Needs  . Financial resource strain: Not on file  . Food insecurity:    Worry: Not on file    Inability: Not on file  . Transportation needs:    Medical: Not on file    Non-medical: Not on file  Tobacco Use  . Smoking status: Former Smoker    Packs/day: 1.00    Last attempt to quit: 07/02/2016    Years since quitting: 2.2  . Smokeless tobacco: Never Used  Substance and Sexual Activity  . Alcohol use: No  . Drug use: Yes    Frequency: 7.0 times per week    Types: Marijuana    Comment: smokes blunt on occasion  . Sexual activity: Not on file  Lifestyle  . Physical activity:    Days per week: Not on file    Minutes per session: Not on file  . Stress: Not on file  Relationships  . Social connections:    Talks on phone: Not on file    Gets together: Not on file    Attends religious service: Not on file    Active member of club or organization: Not  on file    Attends meetings of clubs or organizations: Not on file    Relationship status: Not on file  . Intimate partner violence:    Fear of current or ex partner: Not on file    Emotionally abused: Not on file    Physically abused: Not on file    Forced sexual activity: Not on file  Other Topics Concern  . Not on file  Social History Narrative  . Not on file     Physical Exam  Vital Signs and Nursing Notes reviewed Vitals:   09/20/18 1303 09/20/18 1304  BP: 107/84   Pulse: 86 82  Resp:    Temp:    SpO2: 100% 100%    CONSTITUTIONAL: Well-appearing, NAD NEURO:  Alert and oriented x 3, no focal deficits EYES:  eyes equal and reactive ENT/NECK:  no LAD, no JVD CARDIO: Tachycardic rate, well-perfused, normal S1 and S2 PULM:  CTAB no wheezing or rhonchi GI/GU:  normal bowel  sounds, non-distended, non-tender; normal-appearing external genitalia, no adnexal tenderness, no cervical motion tenderness, no vaginal wall lacerations, mild amount of blood in the vaginal vault MSK/SPINE:  No gross deformities, no edema SKIN:  no rash, atraumatic PSYCH:  Appropriate speech and behavior  Diagnostic and Interventional Summary    Labs Reviewed  CBC - Abnormal; Notable for the following components:      Result Value   Hemoglobin 10.0 (*)    HCT 33.2 (*)    MCV 70.3 (*)    MCH 21.2 (*)    RDW 19.9 (*)    Platelets 418 (*)    All other components within normal limits  COMPREHENSIVE METABOLIC PANEL - Abnormal; Notable for the following components:   Potassium 2.8 (*)    BUN <5 (*)    Calcium 8.8 (*)    All other components within normal limits  PROTIME-INR - Abnormal; Notable for the following components:   Prothrombin Time 19.9 (*)    All other components within normal limits  APTT - Abnormal; Notable for the following components:   aPTT 42 (*)    All other components within normal limits  POC URINE PREG, ED    US Pelvis Complete  Final Result    US PELVIS TRANSVANGINAL NON-OB (TV ONLY)  Final Result    DG Chest 2 View  Final Result      Medications  acetaminophen (TYLENOL) tablet 650 mg (0 mg Oral Hold 09/20/18 1302)  sodium chloride 0.9 % bolus 1,000 mL (0 mLs Intravenous Stopped 09/20/18 1441)  potassium chloride SA (K-DUR,KLOR-CON) CR tablet 40 mEq (40 mEq Oral Given 09/20/18 1454)  ondansetron (ZOFRAN-ODT) disintegrating tablet 4 mg (4 mg Oral Given 09/20/18 1604)     Procedures Critical Care  ED Course and Medical Decision Making  I have reviewed the triage vital signs and the nursing notes.  Pertinent labs & imaging results that were available during my care of the patient were reviewed by me and considered in my medical decision making (see below for details).  Favoring a regular uterine bleeding in the setting of perimenopause,  complicated by anticoagulant use.  Will ultrasound to exclude fibroid as the etiology of abnormal bleeding.  We will also chest x-ray today to exclude secondary bacterial pneumonia.  Labs pending.  Chest x-ray unremarkable, pelvic ultrasound with small fibroid, likely unrelated, no other issues.  hCG negative.  H&H reassuring.  Potassium repleted here in the ED.  Discussed case with Dr. Si Raider of GYN, will follow-up in  clinic shortly.  Prescribed norethindrone.  After the discussed management above, the patient was determined to be safe for discharge.  The patient was in agreement with this plan and all questions regarding their care were answered.  ED return precautions were discussed and the patient will return to the ED with any significant worsening of condition.  Barth Kirks. Sedonia Small, [place] [email or web address]  Final Clinical Impressions(s) / ED Diagnoses     ICD-10-CM   1. Abnormal uterine bleeding (AUB) N93.9   2. Cough R05 DG Chest 2 View    DG Chest 2 View  3. Abnormal uterine bleeding N93.9 US PELVIS TRANSVANGINAL NON-OB (TV ONLY)    US PELVIS TRANSVANGINAL NON-OB (TV ONLY)  4. Anticoagulated Z79.01     ED Discharge Orders         Ordered    norethindrone (AYGESTIN) 5 MG tablet  2 times daily     09/20/18 1612             Maudie Flakes, MD 09/20/18 1621

## 2018-09-20 NOTE — ED Triage Notes (Signed)
Pt here with c/o vaginal bleeding x48 hours.  Pt feeling sob and lethargic.  Pt denies any pain currently.

## 2018-09-20 NOTE — ED Notes (Signed)
Patient transported to X-ray 

## 2018-09-20 NOTE — ED Notes (Signed)
Pt stable, ambulatory, and verbalizes understanding of d/c instructions.   Pt declined wheelchair.

## 2018-09-20 NOTE — Discharge Instructions (Addendum)
You were evaluated in the Emergency Department and after careful evaluation, we did not find any emergent condition requiring admission or further testing in the hospital.  Your symptoms today seem to be due to abnormal uterine bleeding, made worse in the setting of anticoagulation.  Please take the medication provided as directed to help with your bleeding.  Please call the [place] office as soon as possible, Dr. Si Raider will be expecting you.  Please return to the Emergency Department if you experience any worsening of your condition.  We encourage you to follow up with a primary care provider.  Thank you for allowing [place] to be a part of your care.

## 2018-10-02 ENCOUNTER — Other Ambulatory Visit: Payer: Self-pay | Admitting: *Deleted

## 2018-10-11 ENCOUNTER — Telehealth: Payer: Self-pay | Admitting: Obstetrics and Gynecology

## 2018-10-11 NOTE — Telephone Encounter (Signed)
Appt was changed from January 20 to January 16 Due to the provider will not be available, called patient and left a message with a family member and a reminder letter was also mailed

## 2018-10-19 ENCOUNTER — Encounter: Payer: Self-pay | Admitting: Obstetrics and Gynecology

## 2018-10-23 ENCOUNTER — Encounter: Payer: Self-pay | Admitting: Obstetrics & Gynecology

## 2018-10-26 ENCOUNTER — Other Ambulatory Visit: Payer: Self-pay

## 2018-10-26 ENCOUNTER — Encounter (HOSPITAL_COMMUNITY): Payer: Self-pay | Admitting: Vascular Surgery

## 2018-10-26 ENCOUNTER — Ambulatory Visit (HOSPITAL_COMMUNITY)
Admission: RE | Admit: 2018-10-26 | Discharge: 2018-10-26 | Disposition: A | Discharge status: Home / Self Care | Payer: Self-pay | Attending: Vascular Surgery | Admitting: Vascular Surgery

## 2018-10-26 ENCOUNTER — Encounter (HOSPITAL_COMMUNITY)
Admission: RE | Disposition: A | Discharge status: Home / Self Care | Payer: Self-pay | Source: Home / Self Care | Attending: Vascular Surgery

## 2018-10-26 ENCOUNTER — Telehealth: Payer: Self-pay | Admitting: Vascular Surgery

## 2018-10-26 DIAGNOSIS — I70202 Unspecified atherosclerosis of native arteries of extremities, left leg: Secondary | ICD-10-CM

## 2018-10-26 DIAGNOSIS — I10 Essential (primary) hypertension: Secondary | ICD-10-CM | POA: Insufficient documentation

## 2018-10-26 DIAGNOSIS — Z7901 Long term (current) use of anticoagulants: Secondary | ICD-10-CM | POA: Insufficient documentation

## 2018-10-26 DIAGNOSIS — Z87891 Personal history of nicotine dependence: Secondary | ICD-10-CM | POA: Insufficient documentation

## 2018-10-26 DIAGNOSIS — M79605 Pain in left leg: Secondary | ICD-10-CM | POA: Insufficient documentation

## 2018-10-26 DIAGNOSIS — I771 Stricture of artery: Secondary | ICD-10-CM | POA: Insufficient documentation

## 2018-10-26 DIAGNOSIS — Z7982 Long term (current) use of aspirin: Secondary | ICD-10-CM | POA: Insufficient documentation

## 2018-10-26 HISTORY — PX: ABDOMINAL AORTOGRAM W/LOWER EXTREMITY: CATH118223

## 2018-10-26 LAB — POCT I-STAT 4, (NA,K, GLUC, HGB,HCT)
Glucose, Bld: 112 mg/dL — ABNORMAL HIGH (ref 70–99)
HCT: 26 % — ABNORMAL LOW (ref 36.0–46.0)
Hemoglobin: 8.8 g/dL — ABNORMAL LOW (ref 12.0–15.0)
Potassium: 3.6 mmol/L (ref 3.5–5.1)
Sodium: 142 mmol/L (ref 135–145)

## 2018-10-26 LAB — POCT I-STAT CREATININE: Creatinine, Ser: 0.8 mg/dL (ref 0.44–1.00)

## 2018-10-26 SURGERY — ABDOMINAL AORTOGRAM W/LOWER EXTREMITY
Anesthesia: LOCAL | Laterality: Left

## 2018-10-26 MED ORDER — LISINOPRIL-HYDROCHLOROTHIAZIDE 20-25 MG PO TABS: 1.0000 | ORAL_TABLET | Freq: Every day | ORAL | Status: DC

## 2018-10-26 MED ORDER — HEPARIN (PORCINE) IN NACL 1000-0.9 UT/500ML-% IV SOLN
INTRAVENOUS | Status: AC
  Filled 2018-10-26: qty 1000

## 2018-10-26 MED ORDER — NORETHINDRONE ACETATE 5 MG PO TABS: 5.0000 mg | ORAL_TABLET | Freq: Two times a day (BID) | ORAL | Status: DC

## 2018-10-26 MED ORDER — IODIXANOL 320 MG/ML IV SOLN
INTRAVENOUS | Status: DC | PRN
  Administered 2018-10-26: 110 mL via INTRAVENOUS

## 2018-10-26 MED ORDER — HYDRALAZINE HCL 20 MG/ML IJ SOLN: 5.0000 mg | INTRAMUSCULAR | Status: DC | PRN

## 2018-10-26 MED ORDER — SODIUM CHLORIDE 0.9 % IV SOLN: 250.0000 mL | INTRAVENOUS | Status: DC | PRN

## 2018-10-26 MED ORDER — LIDOCAINE HCL (PF) 1 % IJ SOLN
INTRAMUSCULAR | Status: DC | PRN
  Administered 2018-10-26: 15 mL

## 2018-10-26 MED ORDER — RIVAROXABAN 20 MG PO TABS: 20.0000 mg | ORAL_TABLET | Freq: Every day | ORAL | Status: DC

## 2018-10-26 MED ORDER — MIDAZOLAM HCL 2 MG/2ML IJ SOLN
INTRAMUSCULAR | Status: AC
  Filled 2018-10-26: qty 2

## 2018-10-26 MED ORDER — MIDAZOLAM HCL 2 MG/2ML IJ SOLN
INTRAMUSCULAR | Status: DC | PRN
  Administered 2018-10-26: 1.5 mg via INTRAVENOUS

## 2018-10-26 MED ORDER — SODIUM CHLORIDE 0.9 % WEIGHT BASED INFUSION: 1.0000 mL/kg/h | INTRAVENOUS | Status: DC

## 2018-10-26 MED ORDER — ASPIRIN 81 MG PO TBEC: 81.0000 mg | DELAYED_RELEASE_TABLET | Freq: Every day | ORAL | Status: DC

## 2018-10-26 MED ORDER — CYCLOBENZAPRINE HCL 10 MG PO TABS: 10.0000 mg | ORAL_TABLET | Freq: Every day | ORAL | Status: DC

## 2018-10-26 MED ORDER — OXYCODONE HCL 5 MG PO TABS: 5.0000 mg | ORAL_TABLET | ORAL | Status: DC | PRN

## 2018-10-26 MED ORDER — SODIUM CHLORIDE 0.9% FLUSH: 3.0000 mL | INTRAVENOUS | Status: DC | PRN

## 2018-10-26 MED ORDER — FENTANYL CITRATE (PF) 100 MCG/2ML IJ SOLN
INTRAMUSCULAR | Status: DC | PRN
  Administered 2018-10-26: 50 ug via INTRAVENOUS

## 2018-10-26 MED ORDER — SODIUM CHLORIDE 0.9% FLUSH: 3.0000 mL | Freq: Two times a day (BID) | INTRAVENOUS | Status: DC

## 2018-10-26 MED ORDER — LIDOCAINE HCL (PF) 1 % IJ SOLN
INTRAMUSCULAR | Status: AC
  Filled 2018-10-26: qty 30

## 2018-10-26 MED ORDER — ONDANSETRON HCL 4 MG/2ML IJ SOLN: 4.0000 mg | Freq: Four times a day (QID) | INTRAMUSCULAR | Status: DC | PRN

## 2018-10-26 MED ORDER — FENTANYL CITRATE (PF) 100 MCG/2ML IJ SOLN
INTRAMUSCULAR | Status: AC
  Filled 2018-10-26: qty 2

## 2018-10-26 MED ORDER — MEDROXYPROGESTERONE ACETATE 10 MG PO TABS: 10.0000 mg | ORAL_TABLET | Freq: Every day | ORAL | Status: DC

## 2018-10-26 MED ORDER — ACETAMINOPHEN 325 MG PO TABS: 650.0000 mg | ORAL_TABLET | ORAL | Status: DC | PRN

## 2018-10-26 MED ORDER — SODIUM CHLORIDE 0.9 % IV SOLN
INTRAVENOUS | Status: DC
  Administered 2018-10-26: 06:00:00 via INTRAVENOUS

## 2018-10-26 MED ORDER — HEPARIN (PORCINE) IN NACL 1000-0.9 UT/500ML-% IV SOLN
INTRAVENOUS | Status: DC | PRN
  Administered 2018-10-26 (×2): 500 mL

## 2018-10-26 MED ORDER — LABETALOL HCL 5 MG/ML IV SOLN: 10.0000 mg | INTRAVENOUS | Status: DC | PRN

## 2018-10-26 SURGICAL SUPPLY — 10 items
CATH OMNI FLUSH 5F 65CM (CATHETERS) ×2 IMPLANT
CLOSURE MYNX CONTROL 5F (Vascular Products) ×2 IMPLANT
KIT MICROPUNCTURE NIT STIFF (SHEATH) ×2 IMPLANT
KIT PV (KITS) ×2 IMPLANT
SHEATH PINNACLE 5F 10CM (SHEATH) ×2 IMPLANT
SHEATH PROBE COVER 6X72 (BAG) ×2 IMPLANT
SYR MEDRAD MARK V 150ML (SYRINGE) ×2 IMPLANT
TRANSDUCER W/STOPCOCK (MISCELLANEOUS) ×2 IMPLANT
TRAY PV CATH (CUSTOM PROCEDURE TRAY) ×2 IMPLANT
WIRE BENTSON .035X145CM (WIRE) ×2 IMPLANT

## 2018-10-26 NOTE — Telephone Encounter (Signed)
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 Donzetta Matters, MD Procedure Performed: 1.  Ultrasound-guided cannulation right common femoral artery 2.  Aortogram and left lower extremity runoff 3.  Selection of left common femoral artery 4.  Minx device closure right common femoral artery 5.  Moderate sedation with fentanyl Versed for 28 minutes.  F/u in 3-4 months with ABI. Can f/u with np or pa

## 2018-10-26 NOTE — Discharge Instructions (Signed)
Femoral Site Care °This sheet gives you information about how to care for yourself after your procedure. Your health care provider may also give you more specific instructions. If you have problems or questions, contact your health care provider. °What can I expect after the procedure? °After the procedure, it is common to have: °· Bruising that usually fades within 1-2 weeks. °· Tenderness at the site. °Follow these instructions at home: °Wound care °· Follow instructions from your health care provider about how to take care of your insertion site. Make sure you: °? Wash your hands with soap and water before you change your bandage (dressing). If soap and water are not available, use hand sanitizer. °? Change your dressing as told by your health care provider. °? Leave stitches (sutures), skin glue, or adhesive strips in place. These skin closures may need to stay in place for 2 weeks or longer. If adhesive strip edges start to loosen and curl up, you may trim the loose edges. Do not remove adhesive strips completely unless your health care provider tells you to do that. °· Do not take baths, swim, or use a hot tub until your health care provider approves. °· You may shower 24-48 hours after the procedure or as told by your health care provider. °? Gently wash the site with plain soap and water. °? Pat the area dry with a clean towel. °? Do not rub the site. This may cause bleeding. °· Do not apply powder or lotion to the site. Keep the site clean and dry. °· Check your femoral site every day for signs of infection. Check for: °? Redness, swelling, or pain. °? Fluid or blood. °? Warmth. °? Pus or a bad smell. °Activity °· For the first 2-3 days after your procedure, or as long as directed: °? Avoid climbing stairs as much as possible. °? Do not squat. °· Do not lift anything that is heavier than 10 lb (4.5 kg), or the limit that you are told, until your health care provider says that it is safe. °· Rest as  directed. °? Avoid sitting for a long time without moving. Get up to take short walks every 1-2 hours. °· Do not drive for 24 hours if you were given a medicine to help you relax (sedative). °General instructions °· Take over-the-counter and prescription medicines only as told by your health care provider. °· Keep all follow-up visits as told by your health care provider. This is important. °Contact a health care provider if you have: °· A fever or chills. °· You have redness, swelling, or pain around your insertion site. °Get help right away if: °· The catheter insertion area swells very fast. °· You pass out. °· You suddenly start to sweat or your skin gets clammy. °· The catheter insertion area is bleeding, and the bleeding does not stop when you hold steady pressure on the area. °· The area near or just beyond the catheter insertion site becomes pale, cool, tingly, or numb. °These symptoms may represent a serious problem that is an emergency. Do not wait to see if the symptoms will go away. Get medical help right away. Call your local emergency services (911 in the [place] Do not drive yourself to the hospital. °Summary °· After the procedure, it is common to have bruising that usually fades within 1-2 weeks. °· Check your femoral site every day for signs of infection. °· Do not lift anything that is heavier than 10 lb (4.5 kg), or the   limit that you are told, until your health care provider says that it is safe. °This information is not intended to replace advice given to you by your health care provider. Make sure you discuss any questions you have with your health care provider. °Document Released: 05/24/2014 Document Revised: 10/03/2017 Document Reviewed: 10/03/2017 °Elsevier Interactive Patient Education © 2019 [employer] ° °

## 2018-10-26 NOTE — H&P (Signed)
Patient ID: Emma Medina, female   DOB: [birth year], 48 y.o.   MRN: [id number]  Reason for Consult: Follow-up (3 month f/u)   Referred by Placey, Audrea Muscat, NP  Subjective:    Subjective    HPI:  Emma Medina is a 48 y.o. female underwent left lower extremity thromboembolectomy for acute limb ischemia as well as balloon angioplasty of the posterior tibial artery.  She is now on Xarelto and aspirin.  She has healed her wounds well but she has persistent pain in her posterior tibial access site on the left and also her left great toe is numb as is the dorsum of her foot.  She is able to walk without much limitation she does not have any wounds at this time.      Past Medical History:  Diagnosis Date  . Hypertension   . Obstructive thrombus   . Sciatica         Family History  Problem Relation Age of Onset  . Cancer Mother        brain and uterine  . Stroke Brother   . Diabetes Brother   . Breast cancer Neg Hx         Past Surgical History:  Procedure Laterality Date  . ABDOMINAL AORTOGRAM N/A 05/18/2018   Procedure: ABDOMINAL AORTOGRAM;  Surgeon: Waynetta Sandy, MD;  Location: [place] [facility];  Service: Cardiovascular;  Laterality: N/A;  . LOWER EXTREMITY ANGIOGRAPHY Bilateral 05/18/2018   Procedure: LOWER EXTREMITY ANGIOGRAPHY;  Surgeon: Waynetta Sandy, MD;  Location: [place] [facility];  Service: Cardiovascular;  Laterality: Bilateral;  . THROMBECTOMY FEMORAL ARTERY Left 05/18/2018   Procedure: LEFT LEG THROMBECTOMY, BALLOON ANGIOPLASTY LEFT POSTERIOR TIBIAL ARTERY;  Surgeon: Waynetta Sandy, MD;  Location: [place];  Service: Vascular;  Laterality: Left;  . TUBAL LIGATION      Short Social History:  Social History        Tobacco Use  . Smoking status: Former Smoker    Packs/day: 1.00    Last attempt to quit: 07/02/2016    Years since quitting: 2.2  . Smokeless tobacco: Never Used  Substance Use Topics    . Alcohol use: No    No Known Allergies        Current Outpatient Medications  Medication Sig Dispense Refill  . aspirin EC 81 MG EC tablet Take 1 tablet (81 mg total) by mouth daily.    . Cyclobenzaprine HCl (FLEXERIL PO) Take 10 mg by mouth 2 (two) times daily.     . hydrocortisone 2.5 % lotion Apply topically 2 (two) times daily. 118 mL 0  . hydrOXYzine (ATARAX/VISTARIL) 25 MG tablet Take 2 tablets (50 mg total) by mouth every 6 (six) hours as needed for itching. 30 tablet 0  . XARELTO 20 MG TABS tablet Take 1 tablet (20 mg total) by mouth daily with supper. 30 tablet 5   No current facility-administered medications for this visit.     Review of Systems  HENT: HENT negative.  Eyes: Eyes negative.  Respiratory: Positive for shortness of breath.  GI: Gastrointestinal negative.  Musculoskeletal: Positive for leg pain.  Skin: Negative for rash and wound.  Neurological: Neurological negative. Hematologic: Hematologic/lymphatic negative.  Psychiatric: Psychiatric negative.        Objective:   Objective        Vitals:   09/15/18 1450  BP: 109/80  Pulse: 91  Resp: 16  SpO2: 99%  Weight: 237 lb 8 oz (107.7  kg)  Height: 5\' 6"  (1.676 m)   Body mass index is 38.33 kg/m.  Physical Exam HENT:     Head: Normocephalic.  Eyes:     Pupils: Pupils are equal, round, and reactive to light.  Neck:     Musculoskeletal: Neck supple.  Cardiovascular:     Rate and Rhythm: Normal rate.     Pulses:          Radial pulses are 2+ on the right side and 2+ on the left side.       Femoral pulses are 2+ on the right side and 2+ on the left side.      Dorsalis pedis pulses are 2+ on the right side and 0 on the left side.       Posterior tibial pulses are 0 on the left side.     Comments: Weak posterior tibial signal on the left Pulmonary:     Effort: Pulmonary effort is normal.  Abdominal:     Palpations: Abdomen is soft.  Skin:    General: Skin is warm and  dry.  Neurological:     General: No focal deficit present.     Mental Status: She is alert and oriented to person, place, and time.  Psychiatric:        Mood and Affect: Mood normal.        Behavior: Behavior normal.        Thought Content: Thought content normal.        Judgment: Judgment normal.     Data: I have independently interpreted her ABIs to be 1.3 right and 1.04 left on the left she has a toe pressure of 0  I have also independently interpreted her left lower extremity duplex which demonstrates triphasic waveforms to the PT artery proximally and then it is occluded reconstitutes distally via collaterals.     Assessment/Plan:   Assessment    48 year old female follows up after left lower extremity thromboembolectomy.  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  Given that this is now chronic process I think we should begin with angiogram to see if we can open up her posterior tibial artery.  will begin with aortogram today with possible intervention today.     Waynetta Sandy MD [facility]

## 2018-10-26 NOTE — Op Note (Signed)
    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 Donzetta Matters, MD Procedure Performed: 1.  Ultrasound-guided cannulation right common femoral artery 2.  Aortogram and left lower extremity runoff 3.  Selection of left common femoral artery 4.  Minx device closure right common femoral artery 5.  Moderate sedation with fentanyl Versed for 28 minutes  Indications: 48 year old female initially presented with acutely occluded left lower extremity and underwent thromboembolectomy.  On recent follow-up she is noted to have pain and numbness in her left foot with a toe pressure of 0.  She is indicated for angiogram possible intervention.  Findings: The aorta and iliac segments are free of disease.  Left lower extremity has brisk flow through the profunda and SFA to the level of the popliteal artery where there is approximately 30% stenosis likely associated with previous thrombectomy site.  Runoff is via the anterior tibial artery in line only to the level of the foot where it is approximately 1 mm.  There does not appear to be much flow into the foot at all.  There is a peroneal artery that reconstitutes mid calf also gives out of the ankle.  There is no discernible posterior tibial artery.   Procedure:  The patient was identified in the holding area and taken to room 8.  The patient was then placed supine on the table and prepped and draped in the usual sterile fashion.  A time out was called.  Ultrasound was used to evaluate the right common femoral artery which was noted to be free of disease.  An image was saved the permanent record.  The area was anesthetized 1% lidocaine cannulated with direct ultrasound visualization a micropuncture needle followed by wire G.  A Bentson wire placed followed by 5 French sheath.  Omni Flush catheter was  placed to level of L1 aortogram was performed.  We crossed the bifurcation with Omni catheter and Bentson wire.  Left lower extremity angiogram was performed.  We performed spot views of the foot and of the below-knee popliteal area.  No intervention was undertaken.  Catheter was removed with wire.  Minx device was deployed in the right groin as she did tolerate well.  Contrast: 110cc   Lillie Bollig C. Donzetta Matters, MD [facility] Office: [phone] Pager: [phone]

## 2018-10-26 NOTE — Telephone Encounter (Signed)
sch appt spk to pt mld ltr 02/02/2019 10am ABI 1045am f/u NP

## 2018-10-26 NOTE — Progress Notes (Signed)
No signs or symptoms of bleeding or hematoma post ambulation

## 2019-01-18 ENCOUNTER — Other Ambulatory Visit: Payer: Self-pay

## 2019-01-18 DIAGNOSIS — I779 Disorder of arteries and arterioles, unspecified: Secondary | ICD-10-CM

## 2019-02-02 ENCOUNTER — Ambulatory Visit: Payer: Self-pay | Admitting: Family

## 2019-02-02 ENCOUNTER — Encounter (HOSPITAL_COMMUNITY): Payer: Self-pay

## 2019-02-20 ENCOUNTER — Encounter (HOSPITAL_COMMUNITY): Payer: Self-pay

## 2019-02-20 ENCOUNTER — Emergency Department (HOSPITAL_COMMUNITY): Payer: Self-pay

## 2019-02-20 ENCOUNTER — Other Ambulatory Visit: Payer: Self-pay

## 2019-02-20 ENCOUNTER — Emergency Department (HOSPITAL_COMMUNITY)
Admission: EM | Admit: 2019-02-20 | Discharge: 2019-02-20 | Disposition: A | Discharge status: Home / Self Care | Payer: Self-pay | Attending: Emergency Medicine | Admitting: Emergency Medicine

## 2019-02-20 DIAGNOSIS — F1290 Cannabis use, unspecified, uncomplicated: Secondary | ICD-10-CM | POA: Insufficient documentation

## 2019-02-20 DIAGNOSIS — I10 Essential (primary) hypertension: Secondary | ICD-10-CM | POA: Insufficient documentation

## 2019-02-20 DIAGNOSIS — Z87891 Personal history of nicotine dependence: Secondary | ICD-10-CM | POA: Insufficient documentation

## 2019-02-20 DIAGNOSIS — M25512 Pain in left shoulder: Secondary | ICD-10-CM | POA: Insufficient documentation

## 2019-02-20 DIAGNOSIS — M25522 Pain in left elbow: Secondary | ICD-10-CM | POA: Insufficient documentation

## 2019-02-20 MED ORDER — DICLOFENAC SODIUM 1 % TD GEL: 2.0000 g | Freq: Four times a day (QID) | TRANSDERMAL | 0 refills | Status: DC | PRN

## 2019-02-20 NOTE — Discharge Instructions (Signed)

## 2019-02-20 NOTE — ED Triage Notes (Signed)
Pt arrives POV for eval of L elbow pain. States she struck it on a piece of furniture 2 weeks ago and pain has not improved since that time. Has tried ice, OTC meds etc w/ no relief. No obvious deformity, denies numbness/tingling, states weakness d/t pain.

## 2019-02-20 NOTE — ED Provider Notes (Signed)
Emergency Department Provider Note   I have reviewed the triage vital signs and the nursing notes.   HISTORY  Chief Complaint Elbow Pain   HPI Emma Medina is a 48 y.o. female presents to the ED with left elbow and shoulder pain for the last several days. No fever or chills. No specific injury to start the symptoms. 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 Denies any back/neck pain. Has tried Tylenol at home without relief. She is not interested in taking a break from work. No joint redness. Pain is worse with movement.   Past Medical History:  Diagnosis Date  . Hypertension   . Obstructive thrombus   . Sciatica     Patient Active Problem List   Diagnosis Date Noted  . Obstructive thrombus 05/18/2018  . Ischemia of extremity 05/18/2018    Past Surgical History:  Procedure Laterality Date  . ABDOMINAL AORTOGRAM N/A 05/18/2018   Procedure: ABDOMINAL AORTOGRAM;  Surgeon: Waynetta Sandy, MD;  Location: [place] [facility];  Service: Cardiovascular;  Laterality: N/A;  . ABDOMINAL AORTOGRAM W/LOWER EXTREMITY Left 10/26/2018   Procedure: ABDOMINAL AORTOGRAM W/LOWER EXTREMITY;  Surgeon: Waynetta Sandy, MD;  Location: [facility];  Service: Cardiovascular;  Laterality: Left;  . LOWER EXTREMITY ANGIOGRAPHY Bilateral 05/18/2018   Procedure: LOWER EXTREMITY ANGIOGRAPHY;  Surgeon: Waynetta Sandy, MD;  Location: [place] [facility];  Service: Cardiovascular;  Laterality: Bilateral;  . THROMBECTOMY FEMORAL ARTERY Left 05/18/2018   Procedure: LEFT LEG THROMBECTOMY, BALLOON ANGIOPLASTY LEFT POSTERIOR TIBIAL ARTERY;  Surgeon: Waynetta Sandy, MD;  Location: [facility];  Service: Vascular;  Laterality: Left;  . TUBAL LIGATION      Allergies Patient has no known allergies.  Family History  Problem Relation Age of Onset  . Cancer Mother        brain and uterine  . Stroke Brother   . Diabetes Brother   . Breast  cancer Neg Hx     Social History Social History   Tobacco Use  . Smoking status: Former Smoker    Packs/day: 1.00    Last attempt to quit: 07/02/2016    Years since quitting: 2.6  . Smokeless tobacco: Never Used  Substance Use Topics  . Alcohol use: No  . Drug use: Yes    Frequency: 7.0 times per week    Types: Marijuana    Comment: smokes blunt on occasion    Review of Systems  Constitutional: No fever/chills Musculoskeletal: Negative for back pain. Positive left elbow pain.  Skin: Negative for rash. Neurological: Negative for focal weakness or numbness.  10-point ROS otherwise negative.  ____________________________________________   PHYSICAL EXAM:  VITAL SIGNS: ED Triage Vitals  Enc Vitals Group     BP 02/20/19 1303 100/66     Pulse Rate 02/20/19 1303 87     Resp 02/20/19 1303 18     Temp 02/20/19 1303 98.8 F (37.1 C)     Temp Source 02/20/19 1303 Oral     SpO2 02/20/19 1303 100 %     Weight 02/20/19 1309 235 lb (106.6 kg)     Height 02/20/19 1309 5\' 6"  (1.676 m)     Pain Score 02/20/19 1309 7   Constitutional: Alert and oriented. Well appearing and in no acute distress. Eyes: Conjunctivae are normal. Head: Atraumatic. Nose: No congestion/rhinnorhea. Mouth/Throat: Mucous membranes are moist.  Neck: No stridor. No cervical spine tenderness to palpation. Cardiovascular: Good peripheral circulation.  Respiratory: Normal respiratory effort.   Gastrointestinal: No distention.  Musculoskeletal: Normal ROM of the left shoulder and left elbow. No wrist tenderness. No joint redness, warmth, or swelling.  Neurologic: No gross focal neurologic deficits are appreciated.  Skin:  Skin is warm, dry and intact. No rash noted.  ____________________________________________  RADIOLOGY  Dg Elbow Complete Left  Result Date: 02/20/2019 CLINICAL DATA:  Injury to elbow 1 week ago.  Pain. EXAM: LEFT ELBOW - COMPLETE 3+ VIEW COMPARISON:  None. FINDINGS: There is no  evidence of fracture, dislocation, or joint effusion. There is no evidence of arthropathy or other focal bone abnormality. Soft tissues are unremarkable. IMPRESSION: Negative. Electronically Signed   By: Staci Righter M.D.   On: 02/20/2019 13:51    ____________________________________________   PROCEDURES  Procedure(s) performed:   Procedures  None ____________________________________________   INITIAL IMPRESSION / ASSESSMENT AND PLAN / ED COURSE  Pertinent labs & imaging results that were available during my care of the patient were reviewed by me and considered in my medical decision making (see chart for details).   Patient with left elbow pain. Plain film reviewed without bony abnormality. No evidence of septic joint. Plan for topical pain medication and sports med follow up. No bracing or immobilization. Patient encouraged to get assistance with lifting at work and take rest when needed. Discussed ED return precautions.    ____________________________________________  FINAL CLINICAL IMPRESSION(S) / ED DIAGNOSES  Final diagnoses:  None     MEDICATIONS GIVEN DURING THIS VISIT:  Medications - No data to display   NEW OUTPATIENT MEDICATIONS STARTED DURING THIS VISIT:  New Prescriptions   No medications on file    Note:  This document was prepared using Dragon voice recognition software and may include unintentional dictation errors.  Nanda Quinton, MD Emergency Medicine    Long, Wonda [facility], MD 02/21/19 [phone]

## 2019-03-21 ENCOUNTER — Encounter: Payer: Self-pay | Admitting: *Deleted

## 2019-03-27 ENCOUNTER — Other Ambulatory Visit: Payer: Self-pay | Admitting: Surgery

## 2019-04-23 ENCOUNTER — Encounter: Payer: Self-pay | Admitting: Obstetrics and Gynecology

## 2019-04-25 ENCOUNTER — Encounter: Payer: Self-pay | Admitting: Family

## 2019-04-25 ENCOUNTER — Encounter (HOSPITAL_COMMUNITY): Payer: Self-pay

## 2019-04-25 ENCOUNTER — Ambulatory Visit: Payer: Self-pay | Admitting: Family

## 2019-04-26 ENCOUNTER — Other Ambulatory Visit (HOSPITAL_COMMUNITY)
Admission: RE | Admit: 2019-04-26 | Discharge: 2019-04-26 | Disposition: A | Discharge status: Home / Self Care | Payer: Self-pay | Source: Ambulatory Visit | Attending: Obstetrics & Gynecology | Admitting: Obstetrics & Gynecology

## 2019-04-26 ENCOUNTER — Other Ambulatory Visit: Payer: Self-pay

## 2019-04-26 ENCOUNTER — Encounter: Payer: Self-pay | Admitting: Obstetrics & Gynecology

## 2019-04-26 ENCOUNTER — Ambulatory Visit (INDEPENDENT_AMBULATORY_CARE_PROVIDER_SITE_OTHER): Payer: Self-pay | Admitting: Obstetrics & Gynecology

## 2019-04-26 VITALS — BP 118/74 | HR 72 | Wt 222.4 lb

## 2019-04-26 DIAGNOSIS — N939 Abnormal uterine and vaginal bleeding, unspecified: Secondary | ICD-10-CM

## 2019-04-26 DIAGNOSIS — Z01812 Encounter for preprocedural laboratory examination: Secondary | ICD-10-CM

## 2019-04-26 DIAGNOSIS — C541 Malignant neoplasm of endometrium: Secondary | ICD-10-CM

## 2019-04-26 DIAGNOSIS — D500 Iron deficiency anemia secondary to blood loss (chronic): Secondary | ICD-10-CM | POA: Insufficient documentation

## 2019-04-26 DIAGNOSIS — D219 Benign neoplasm of connective and other soft tissue, unspecified: Secondary | ICD-10-CM

## 2019-04-26 LAB — POCT PREGNANCY, URINE: Preg Test, Ur: NEGATIVE

## 2019-04-26 MED ORDER — MEGESTROL ACETATE 40 MG PO TABS: 40.0000 mg | ORAL_TABLET | Freq: Two times a day (BID) | ORAL | 5 refills | Status: DC

## 2019-04-26 NOTE — Progress Notes (Signed)
Patient ID: Emma Medina, female   DOB: [birth year], 48 y.o.   MRN: [id number]  Chief Complaint  Patient presents with  . Metrorrhagia    HPI Emma Medina is a 48 y.o. single P8 (80 to 33 yo kids, 25 grands)  here to discuss DUB. She has anemia. She eats ice, tried OTC iron but it didn't sit well on her stomach.  She was diagnosed with a fibroid via u/s 12/19. She has pelvic pain with her period. She bleeds irregularly. She isn't taking any meds.  Past Medical History:  Diagnosis Date  . Hypertension   . Obstructive thrombus   . Sciatica     Past Surgical History:  Procedure Laterality Date  . ABDOMINAL AORTOGRAM N/A 05/18/2018   Procedure: ABDOMINAL AORTOGRAM;  Surgeon: Waynetta Sandy, MD;  Location: [facility];  Service: Cardiovascular;  Laterality: N/A;  . ABDOMINAL AORTOGRAM W/LOWER EXTREMITY Left 10/26/2018   Procedure: ABDOMINAL AORTOGRAM W/LOWER EXTREMITY;  Surgeon: Waynetta Sandy, MD;  Location: [facility] [facility];  Service: Cardiovascular;  Laterality: Left;  . LOWER EXTREMITY ANGIOGRAPHY Bilateral 05/18/2018   Procedure: LOWER EXTREMITY ANGIOGRAPHY;  Surgeon: Waynetta Sandy, MD;  Location: [facility];  Service: Cardiovascular;  Laterality: Bilateral;  . THROMBECTOMY FEMORAL ARTERY Left 05/18/2018   Procedure: LEFT LEG THROMBECTOMY, BALLOON ANGIOPLASTY LEFT POSTERIOR TIBIAL ARTERY;  Surgeon: Waynetta Sandy, MD;  Location: [facility];  Service: Vascular;  Laterality: Left;  . TUBAL LIGATION      Family History  Problem Relation Age of Onset  . Cancer Mother        brain and uterine  . Stroke Brother   . Diabetes Brother   . Breast cancer Neg Hx     Social History Social History   Tobacco Use  . Smoking status: Former Smoker    Packs/day: 1.00    Quit date: 07/02/2016    Years since quitting: 2.8  . Smokeless tobacco: Never Used  Substance Use Topics  . Alcohol use: No  . Drug use: Yes    Frequency: 7.0  times per week    Types: Marijuana    Comment: smokes blunt on occasion    No Known Allergies  Current Outpatient Medications  Medication Sig Dispense Refill  . aspirin EC 81 MG EC tablet Take 1 tablet (81 mg total) by mouth daily.    . cyclobenzaprine (FLEXERIL) 10 MG tablet Take 10 mg by mouth daily.     . diclofenac sodium (VOLTAREN) 1 % GEL Apply 2 g topically 4 (four) times daily as needed. 1 Tube 0  . hydrocortisone 2.5 % lotion Apply topically 2 (two) times daily. (Patient taking differently: Apply 1 application topically 2 (two) times daily. Applied to rash on legs) 118 mL 0  . hydrOXYzine (ATARAX/VISTARIL) 25 MG tablet Take 2 tablets (50 mg total) by mouth every 6 (six) hours as needed for itching. (Patient taking differently: Take 25 mg by mouth at bedtime as needed (sleep.). ) 30 tablet 0  . lisinopril-hydrochlorothiazide (PRINZIDE,ZESTORETIC) 20-25 MG tablet Take 1 tablet by mouth daily.    Alveda Reasons 20 MG TABS tablet Take 1 tablet by mouth once daily 30 tablet 0  . medroxyPROGESTERone (PROVERA) 10 MG tablet Take 10 mg by mouth daily.    . norethindrone (AYGESTIN) 5 MG tablet Take 1 tablet (5 mg total) by mouth 2 (two) times daily. (Patient not taking: Reported on 10/17/2018) 20 tablet 0   No current facility-administered medications for  this visit.     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  Physical Exam Physical Exam Breathing, conversing, and ambulating normally Well nourished, well hydrated Black female, no apparent distress Abd- benign, obese  UPT negative, consent signed, time out done Cervix prepped with betadine and grasped with a single tooth tenaculum Uterus sounded to 9 cm Pipelle used for 2 passes with a moderate amount of tissue obtained. She tolerated the procedure well.   Data Reviewed U/s and labs  Assessment Preventative care Anemia fibroids  Plan Mammogram  scholarship and referral to [facility] Iron infusion x 2 Recheck cbc EMBX today Megace 40 mg BID I have rec'd financial aid forms as well as applying for Medicaid Come back 4 weeks    Eloyce Bultman C Saivion Goettel 04/26/2019, 2:06 PM

## 2019-04-26 NOTE — Progress Notes (Signed)
Pt given Mammogram scholarship and faxed with successful transmission.  Info for [facility] given to call for pap smear.

## 2019-04-27 ENCOUNTER — Other Ambulatory Visit: Payer: Self-pay

## 2019-04-27 DIAGNOSIS — I779 Disorder of arteries and arterioles, unspecified: Secondary | ICD-10-CM

## 2019-05-01 ENCOUNTER — Other Ambulatory Visit (HOSPITAL_COMMUNITY): Payer: Self-pay | Admitting: *Deleted

## 2019-05-01 DIAGNOSIS — Z1231 Encounter for screening mammogram for malignant neoplasm of breast: Secondary | ICD-10-CM

## 2019-05-02 ENCOUNTER — Other Ambulatory Visit (HOSPITAL_COMMUNITY): Payer: Self-pay | Admitting: *Deleted

## 2019-05-02 NOTE — Discharge Instructions (Signed)

## 2019-05-03 ENCOUNTER — Telehealth (HOSPITAL_COMMUNITY): Payer: Self-pay

## 2019-05-03 ENCOUNTER — Other Ambulatory Visit: Payer: Self-pay

## 2019-05-03 ENCOUNTER — Ambulatory Visit (HOSPITAL_COMMUNITY)
Admission: RE | Admit: 2019-05-03 | Discharge: 2019-05-03 | Disposition: A | Discharge status: Home / Self Care | Payer: Self-pay | Source: Ambulatory Visit | Attending: Obstetrics & Gynecology | Admitting: Obstetrics & Gynecology

## 2019-05-03 DIAGNOSIS — I779 Disorder of arteries and arterioles, unspecified: Secondary | ICD-10-CM | POA: Insufficient documentation

## 2019-05-03 MED ORDER — SODIUM CHLORIDE 0.9 % IV SOLN
510.0000 mg | INTRAVENOUS | Status: DC
  Administered 2019-05-03: 510 mg via INTRAVENOUS
  Filled 2019-05-03: qty 17

## 2019-05-03 NOTE — Telephone Encounter (Signed)

## 2019-05-04 ENCOUNTER — Ambulatory Visit (INDEPENDENT_AMBULATORY_CARE_PROVIDER_SITE_OTHER): Payer: Self-pay | Admitting: Family

## 2019-05-04 ENCOUNTER — Ambulatory Visit (INDEPENDENT_AMBULATORY_CARE_PROVIDER_SITE_OTHER)
Admission: RE | Admit: 2019-05-04 | Discharge: 2019-05-04 | Disposition: A | Discharge status: Home / Self Care | Payer: Self-pay | Source: Ambulatory Visit | Attending: Obstetrics & Gynecology | Admitting: Obstetrics & Gynecology

## 2019-05-04 ENCOUNTER — Other Ambulatory Visit: Payer: Self-pay

## 2019-05-04 ENCOUNTER — Encounter: Payer: Self-pay | Admitting: Family

## 2019-05-04 VITALS — BP 108/74 | HR 61 | Temp 98.1°F | Resp 14 | Ht 66.0 in | Wt 220.0 lb

## 2019-05-04 DIAGNOSIS — I779 Disorder of arteries and arterioles, unspecified: Secondary | ICD-10-CM

## 2019-05-04 DIAGNOSIS — Z87891 Personal history of nicotine dependence: Secondary | ICD-10-CM

## 2019-05-04 NOTE — Progress Notes (Signed)
[facility]   CC: Follow up peripheral artery occlusive disease  History of Present Illness Emma Medina is a 48 y.o. female who is s/p aortogram on 10-26-18 by Dr. Donzetta Matters. She initially presented with acutely occluded left lower extremity and underwent thromboembolectomy on 05-18-18 by Dr. Donzetta Matters.  On follow-up she was noted to have pain and numbness in her left foot with a toe pressure of 0. Aortogram findings: The aorta and iliac segments are free of disease.  Left lower extremity has brisk flow through the profunda and SFA to the level of the popliteal artery where there is approximately 30% stenosis likely associated with previous thrombectomy site.  Runoff is via the anterior tibial artery in line only to the level of the foot where it is approximately 1 mm.  There does not appear to be much flow into the foot at all.  There is a peroneal artery that reconstitutes mid calf also gives out of the ankle.  There is no discernible posterior tibial artery. No intervention was undertaken.  She returns today for follow up.   She states she is bleeding vaginally for the last 2 months, was found to have fibroids in her uterus. She has iron infusion yesterday and next week, states she is eating less ice, is less dizzy, and has more energy since the infusion.  She is thinking about a hysterectomy, sees a gynecologist connected with [facility].   Her right knee swelling and pain seems to limit her walking, she thinks this is improving, does not seem to have claudication sx's with walking, has no rest pain, has no non healing wounds.   She reports known right sciatica, and receives injections in her low back which help.   She is on her feet and walking much of the time doing personal care.   Diabetic: No Tobacco use: former smoker, quit in 2017  Pt meds include: Statin :No Betablocker: No ASA: Yes, 81 mg Other anticoagulants/antiplatelets: Xarelto, states she is taking for  hx of arterial thrombus in leg.   Past Medical History:  Diagnosis Date  . Hypertension   . Obstructive thrombus   . Sciatica     Social History Social History   Tobacco Use  . Smoking status: Former Smoker    Packs/day: 1.00    Quit date: 07/02/2016    Years since quitting: 2.8  . Smokeless tobacco: Never Used  Substance Use Topics  . Alcohol use: No  . Drug use: Yes    Frequency: 7.0 times per week    Types: Marijuana    Comment: smokes blunt on occasion    Family History Family History  Problem Relation Age of Onset  . Cancer Mother        brain and uterine  . Stroke Brother   . Diabetes Brother   . Breast cancer Neg Hx     Past Surgical History:  Procedure Laterality Date  . ABDOMINAL AORTOGRAM N/A 05/18/2018   Procedure: ABDOMINAL AORTOGRAM;  Surgeon: Waynetta Sandy, MD;  Location: [facility];  Service: Cardiovascular;  Laterality: N/A;  . ABDOMINAL AORTOGRAM W/LOWER EXTREMITY Left 10/26/2018   Procedure: ABDOMINAL AORTOGRAM W/LOWER EXTREMITY;  Surgeon: Waynetta Sandy, MD;  Location: [facility];  Service: Cardiovascular;  Laterality: Left;  . LOWER EXTREMITY ANGIOGRAPHY Bilateral 05/18/2018   Procedure: LOWER EXTREMITY ANGIOGRAPHY;  Surgeon: Waynetta Sandy, MD;  Location: [facility];  Service: Cardiovascular;  Laterality: Bilateral;  . THROMBECTOMY  FEMORAL ARTERY Left 05/18/2018   Procedure: LEFT LEG THROMBECTOMY, BALLOON ANGIOPLASTY LEFT POSTERIOR TIBIAL ARTERY;  Surgeon: Waynetta Sandy, MD;  Location: [facility];  Service: Vascular;  Laterality: Left;  . TUBAL LIGATION      No Known Allergies  Current Outpatient Medications  Medication Sig Dispense Refill  . aspirin EC 81 MG EC tablet Take 1 tablet (81 mg total) by mouth daily.    . cyclobenzaprine (FLEXERIL) 10 MG tablet Take 10 mg by mouth daily.     . diclofenac sodium (VOLTAREN) 1 % GEL Apply 2 g topically 4 (four) times daily as needed. 1 Tube  0  . hydrocortisone 2.5 % lotion Apply topically 2 (two) times daily. (Patient taking differently: Apply 1 application topically 2 (two) times daily. Applied to rash on legs) 118 mL 0  . hydrOXYzine (ATARAX/VISTARIL) 25 MG tablet Take 2 tablets (50 mg total) by mouth every 6 (six) hours as needed for itching. (Patient taking differently: Take 25 mg by mouth at bedtime as needed (sleep.). ) 30 tablet 0  . lisinopril-hydrochlorothiazide (PRINZIDE,ZESTORETIC) 20-25 MG tablet Take 1 tablet by mouth daily.    . medroxyPROGESTERone (PROVERA) 10 MG tablet Take 10 mg by mouth daily.    . megestrol (MEGACE) 40 MG tablet Take 1 tablet (40 mg total) by mouth 2 (two) times daily. 60 tablet 5  . norethindrone (AYGESTIN) 5 MG tablet Take 1 tablet (5 mg total) by mouth 2 (two) times daily. 20 tablet 0  . XARELTO 20 MG TABS tablet Take 1 tablet by mouth once daily 30 tablet 0   No current facility-administered medications for this visit.     ROS: See HPI for pertinent positives and negatives.   Physical Examination  Vitals:   05/04/19 1144  BP: 108/74  Pulse: 61  Resp: 14  Temp: 98.1 F (36.7 C)  TempSrc: Temporal  SpO2: 100%  Weight: 220 lb (99.8 kg)  Height: 5\' 6"  (1.676 m)   Body mass index is 35.51 kg/m.  General: A&O x 3, WDWN, obese female. Gait: normal HEENT: No gross abnormalities.  Pulmonary: Respirations are non labored, CTAB, good air movement in all fields Cardiac: regular rhythm, no detected murmur.        Carotid Bruits Right Left   Negative Negative   Radial pulses are 1+ palpable bilaterally   Adominal aortic pulse is not palpable                         VASCULAR EXAM: Extremities without ischemic changes, without Gangrene; without open wounds.                                                                                                          LE Pulses Right Left       FEMORAL  not palpable   not palpable        POPLITEAL  not palpable   not palpable        POSTERIOR TIBIAL  1+ palpable   not palpable  DORSALIS PEDIS      ANTERIOR TIBIAL 1+ palpable  not palpable    Abdomen: soft, NT, no palpable masses. Skin: no rashes, no cellulitis, no ulcers noted. Musculoskeletal: no muscle wasting or atrophy.  Neurologic: A&O X 3; appropriate affect, Sensation is normal; MOTOR FUNCTION:  moving all extremities equally, motor strength 5/5 throughout. Speech is fluent/normal. CN 2-12 intact. Psychiatric: Thought content is normal, mood appropriate for clinical situation.    DATA  ABI (Date: 05/04/2019): ABI Findings: +---------+------------------+-----+---------+--------+ Right    Rt Pressure (mmHg)IndexWaveform Comment  +---------+------------------+-----+---------+--------+ Brachial 118                                      +---------+------------------+-----+---------+--------+ ATA      158               1.31 triphasic         +---------+------------------+-----+---------+--------+ PTA      158               1.31 triphasic         +---------+------------------+-----+---------+--------+ Great Toe107               0.88                   +---------+------------------+-----+---------+--------+  +---------+------------------+-----+----------+-------+ Left     Lt Pressure (mmHg)IndexWaveform  Comment +---------+------------------+-----+----------+-------+ Brachial 121                                      +---------+------------------+-----+----------+-------+ ATA      141               1.17 biphasic          +---------+------------------+-----+----------+-------+ PTA      126               1.04 monophasic        +---------+------------------+-----+----------+-------+ Great Toe33                0.27                   +---------+------------------+-----+----------+-------+  +-------+-----------+-----------+------------+------------+ ABI/TBIToday's ABIToday's TBIPrevious  ABIPrevious TBI +-------+-----------+-----------+------------+------------+ Right  1.31       0.88       1.30        0.81         +-------+-----------+-----------+------------+------------+ Left   1.04       0.27       1.04        0            +-------+-----------+-----------+------------+------------+  Bilateral ABIs appear essentially unchanged compared to prior study on 09/15/2018.   Summary: Right: Resting right ankle-brachial index indicates noncompressible right lower extremity arteries.The right toe-brachial index is normal. RT great toe pressure = 107 mmHg.  ABI is just above "normal" range but triphasic Doppler signals in the distal vessels suggest no significant stenosis. Left: Resting left ankle-brachial index is within normal range. No evidence of significant left lower extremity arterial disease. The left toe-brachial index is abnormal. LT Great toe pressure = 33 mmHg.   ASSESSMENT: Emma Medina is a 48 y.o. female who is s/p aortogram on 10-26-18 by Dr. Donzetta Matters. No intervention was undertaken. She initially presented with acutely occluded left lower extremity and underwent thromboembolectomy on 05-18-18 by Dr. Donzetta Matters.  On follow-up she was noted to have pain and numbness in her left foot with a toe pressure of 0.  She now has no claudication sx's with walking, her walking seems limited by right knee pain and swelling which is improving. 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  On ABI's today her left TBI has improved from 0 to 0.27. Bilateral ABI are normal, right with triphasic waveforms, left with bi and monophasic waveforms.  There are no signs of ischemia in her feet or legs.  Her bilateral femoral pulses are not palpable (she is obese), but her right popliteal and right pedal pulses are 1+ palpable.   Her atherosclerotic risk factors include former smoker and obesity. Fortunately she does not have DM and states she is losing weight.   She takes Xarelto and a daily 81 mg ASA.    PLAN:  Based on the patient's vascular studies and examination, pt will return to clinic in 6 months with ABI's. I advised her to notify us if she develops concerns re the circulation in her feet or legs.  Continue extensive walking.   I discussed in depth with the patient the nature of atherosclerosis, and emphasized the importance of maximal medical management including strict control of blood pressure, blood glucose, and lipid levels, obtaining regular exercise, and continued cessation of smoking.  The patient is aware that without maximal medical management the underlying atherosclerotic disease process will progress, limiting the benefit of any interventions.  The patient was given information about PAD including signs, symptoms, treatment, what symptoms should prompt the patient to seek immediate medical care, and risk reduction measures to take.  Clemon Chambers, RN, MSN, FNP-C [facility] Phone: [phone]  Clinic MD: Donzetta Matters  05/04/19 12:02 PM

## 2019-05-04 NOTE — Patient Instructions (Signed)
Peripheral Vascular Disease  Peripheral vascular disease (PVD) is a disease of the blood vessels that are not part of your heart and brain. A simple term for PVD is poor circulation. In most cases, PVD narrows the blood vessels that carry blood from your heart to the rest of your body. This can reduce the supply of blood to your arms, legs, and internal organs, like your stomach or kidneys. However, PVD most often affects a person's lower legs and feet. Without treatment, PVD tends to get worse. PVD can also lead to acute ischemic limb. This is when an arm or leg suddenly cannot get enough blood. This is a medical emergency. Follow these instructions at home: Lifestyle  Do not use any products that contain nicotine or tobacco, such as cigarettes and e-cigarettes. If you need help quitting, ask your doctor.  Lose weight if you are overweight. Or, stay at a healthy weight as told by your doctor.  Eat a diet that is low in fat and cholesterol. If you need help, ask your doctor.  Exercise regularly. Ask your doctor for activities that are right for you. General instructions  Take over-the-counter and prescription medicines only as told by your doctor.  Take good care of your feet: ? Wear comfortable shoes that fit well. ? Check your feet often for any cuts or sores.  Keep all follow-up visits as told by your doctor This is important. Contact a doctor if:  You have cramps in your legs when you walk.  You have leg pain when you are at rest.  You have coldness in a leg or foot.  Your skin changes.  You are unable to get or have an erection (erectile dysfunction).  You have cuts or sores on your feet that do not heal. Get help right away if:  Your arm or leg turns cold, numb, and blue.  Your arms or legs become red, warm, swollen, painful, or numb.  You have chest pain.  You have trouble breathing.  You suddenly have weakness in your face, arm, or leg.  You become very  confused or you cannot speak.  You suddenly have a very bad headache.  You suddenly cannot see. Summary  Peripheral vascular disease (PVD) is a disease of the blood vessels.  A simple term for PVD is poor circulation. Without treatment, PVD tends to get worse.  Treatment may include exercise, low fat and low cholesterol diet, and quitting smoking. This information is not intended to replace advice given to you by your health care provider. Make sure you discuss any questions you have with your health care provider. Document Released: 12/15/2009 Document Revised: 09/02/2017 Document Reviewed: 10/28/2016 Elsevier Patient Education  2020 Else[employer] Inc[employer]  

## 2019-05-09 ENCOUNTER — Other Ambulatory Visit: Payer: Self-pay

## 2019-05-10 ENCOUNTER — Encounter (HOSPITAL_COMMUNITY)
Admission: RE | Admit: 2019-05-10 | Discharge: 2019-05-10 | Disposition: A | Discharge status: Home / Self Care | Payer: Self-pay | Source: Ambulatory Visit | Attending: Obstetrics & Gynecology | Admitting: Obstetrics & Gynecology

## 2019-05-10 DIAGNOSIS — D649 Anemia, unspecified: Secondary | ICD-10-CM | POA: Insufficient documentation

## 2019-05-10 MED ORDER — SODIUM CHLORIDE 0.9 % IV SOLN
510.0000 mg | INTRAVENOUS | Status: AC
  Administered 2019-05-10: 510 mg via INTRAVENOUS
  Filled 2019-05-10: qty 17

## 2019-05-11 ENCOUNTER — Other Ambulatory Visit: Payer: Self-pay | Admitting: Vascular Surgery

## 2019-05-14 ENCOUNTER — Encounter: Payer: Self-pay | Admitting: *Deleted

## 2019-05-23 ENCOUNTER — Other Ambulatory Visit: Payer: Self-pay

## 2019-05-23 ENCOUNTER — Telehealth: Payer: Self-pay | Admitting: Family Medicine

## 2019-05-23 DIAGNOSIS — C55 Malignant neoplasm of uterus, part unspecified: Secondary | ICD-10-CM

## 2019-05-23 NOTE — Progress Notes (Signed)
Scheduled pt for GYN/ONC for Friday, August 28th @ 1015am.  Per Dr. Hulan Fray pt will notified of ONC appt at her provider visit.  Please inform pt that she will need to arrive early as she get COVID tested, she can not bring visitors with her however she can have visitor on speaker phone.    Mel Almond, RN 05/23/19

## 2019-05-23 NOTE — Telephone Encounter (Signed)
Called patient she is aware of her appointment and also instructed to wear a face mask and no visitors are allowed due to [place].

## 2019-05-24 ENCOUNTER — Ambulatory Visit (INDEPENDENT_AMBULATORY_CARE_PROVIDER_SITE_OTHER): Payer: Self-pay | Admitting: Obstetrics & Gynecology

## 2019-05-24 ENCOUNTER — Other Ambulatory Visit: Payer: Self-pay

## 2019-05-24 VITALS — BP 108/79 | HR 80 | Temp 98.5°F | Wt 217.2 lb

## 2019-05-24 DIAGNOSIS — D500 Iron deficiency anemia secondary to blood loss (chronic): Secondary | ICD-10-CM

## 2019-05-24 DIAGNOSIS — R634 Abnormal weight loss: Secondary | ICD-10-CM

## 2019-05-24 NOTE — Progress Notes (Signed)
   Subjective:    Patient ID: Emma Medina, female    DOB: 04/20/1971, 48 y.o.   MRN: [id number]  HPI 48 yo single P8 here for results of EMBX. She was seen here 04/26/19 with DUB and anemia. I started her on megace and that has helped her bleeding. Her biopsy showed endometrial ca. She has an appt with gyn onc next Friday.   Review of Systems     Objective:   Physical Exam Breathing, conversing, and ambulating normally Well nourished, well hydrated Black female, no apparent distress Abd- benign, obese     Assessment & Plan:  Anemia- check cbc Weight loss- check TSH  Endometrial ca- appt with gyn onc, continue megace

## 2019-05-25 LAB — CBC
Hematocrit: 37.5 % (ref 34.0–46.6)
Hemoglobin: 11.8 g/dL (ref 11.1–15.9)
MCH: 22.9 pg — ABNORMAL LOW (ref 26.6–33.0)
MCHC: 31.5 g/dL (ref 31.5–35.7)
MCV: 73 fL — ABNORMAL LOW (ref 79–97)
Platelets: 461 10*3/uL — ABNORMAL HIGH (ref 150–450)
RBC: 5.15 x10E6/uL (ref 3.77–5.28)
RDW: 28 % — ABNORMAL HIGH (ref 11.7–15.4)
WBC: 5.7 10*3/uL (ref 3.4–10.8)

## 2019-05-25 LAB — TSH: TSH: 0.567 u[IU]/mL (ref 0.450–4.500)

## 2019-06-01 ENCOUNTER — Inpatient Hospital Stay (HOSPITAL_BASED_OUTPATIENT_CLINIC_OR_DEPARTMENT_OTHER): Payer: Self-pay | Admitting: Gynecologic Oncology

## 2019-06-01 ENCOUNTER — Other Ambulatory Visit: Payer: Self-pay

## 2019-06-01 ENCOUNTER — Other Ambulatory Visit (HOSPITAL_COMMUNITY)
Admission: RE | Admit: 2019-06-01 | Discharge: 2019-06-01 | Disposition: A | Discharge status: Home / Self Care | Payer: Self-pay | Source: Ambulatory Visit | Attending: Gynecologic Oncology | Admitting: Gynecologic Oncology

## 2019-06-01 ENCOUNTER — Inpatient Hospital Stay: Payer: Self-pay | Attending: Gynecologic Oncology

## 2019-06-01 ENCOUNTER — Encounter: Payer: Self-pay | Admitting: Gynecologic Oncology

## 2019-06-01 VITALS — BP 127/73 | HR 62 | Temp 97.8°F | Resp 20 | Ht 66.0 in | Wt 224.0 lb

## 2019-06-01 DIAGNOSIS — Z7901 Long term (current) use of anticoagulants: Secondary | ICD-10-CM | POA: Insufficient documentation

## 2019-06-01 DIAGNOSIS — C538 Malignant neoplasm of overlapping sites of cervix uteri: Secondary | ICD-10-CM

## 2019-06-01 DIAGNOSIS — I10 Essential (primary) hypertension: Secondary | ICD-10-CM | POA: Insufficient documentation

## 2019-06-01 DIAGNOSIS — Z7982 Long term (current) use of aspirin: Secondary | ICD-10-CM | POA: Insufficient documentation

## 2019-06-01 DIAGNOSIS — Z79899 Other long term (current) drug therapy: Secondary | ICD-10-CM | POA: Insufficient documentation

## 2019-06-01 DIAGNOSIS — Z9189 Other specified personal risk factors, not elsewhere classified: Secondary | ICD-10-CM | POA: Insufficient documentation

## 2019-06-01 DIAGNOSIS — Z793 Long term (current) use of hormonal contraceptives: Secondary | ICD-10-CM | POA: Insufficient documentation

## 2019-06-01 DIAGNOSIS — E669 Obesity, unspecified: Secondary | ICD-10-CM | POA: Insufficient documentation

## 2019-06-01 DIAGNOSIS — Z86718 Personal history of other venous thrombosis and embolism: Secondary | ICD-10-CM | POA: Insufficient documentation

## 2019-06-01 DIAGNOSIS — Z87891 Personal history of nicotine dependence: Secondary | ICD-10-CM | POA: Insufficient documentation

## 2019-06-01 HISTORY — DX: Obesity, unspecified: E66.9

## 2019-06-01 HISTORY — DX: Malignant neoplasm of overlapping sites of cervix uteri: C53.8

## 2019-06-01 LAB — BASIC METABOLIC PANEL
Anion gap: 9 (ref 5–15)
BUN: 9 mg/dL (ref 6–20)
CO2: 23 mmol/L (ref 22–32)
Calcium: 9.3 mg/dL (ref 8.9–10.3)
Chloride: 110 mmol/L (ref 98–111)
Creatinine, Ser: 0.9 mg/dL (ref 0.44–1.00)
GFR calc Af Amer: >60 mL/min (ref >60)
GFR calc non Af Amer: >60 mL/min (ref >60)
Glucose, Bld: 107 mg/dL — ABNORMAL HIGH (ref 70–99)
Potassium: 4.5 mmol/L (ref 3.5–5.1)
Sodium: 142 mmol/L (ref 135–145)

## 2019-06-01 LAB — CBC WITH DIFFERENTIAL (CANCER CENTER ONLY)
Abs Immature Granulocytes: 0.02 10*3/uL (ref 0.00–0.07)
Basophils Absolute: 0.1 10*3/uL (ref 0.0–0.1)
Basophils Relative: 1 %
Eosinophils Absolute: 0.2 10*3/uL (ref 0.0–0.5)
Eosinophils Relative: 3 %
HCT: 37.2 % (ref 36.0–46.0)
Hemoglobin: 11.6 g/dL — ABNORMAL LOW (ref 12.0–15.0)
Immature Granulocytes: 0 %
Lymphocytes Relative: 47 %
Lymphs Abs: 3.3 10*3/uL (ref 0.7–4.0)
MCH: 23.4 pg — ABNORMAL LOW (ref 26.0–34.0)
MCHC: 31.2 g/dL (ref 30.0–36.0)
MCV: 75.2 fL — ABNORMAL LOW (ref 80.0–100.0)
Monocytes Absolute: 0.5 10*3/uL (ref 0.1–1.0)
Monocytes Relative: 8 %
Neutro Abs: 2.8 10*3/uL (ref 1.7–7.7)
Neutrophils Relative %: 41 %
Platelet Count: 329 10*3/uL (ref 150–400)
RBC: 4.95 MIL/uL (ref 3.87–5.11)
WBC Count: 6.9 10*3/uL (ref 4.0–10.5)
nRBC: 0 % (ref 0.0–0.2)

## 2019-06-01 NOTE — Progress Notes (Signed)
I reviewed CT scan MRI instructions with patient. I gave patient the CT contrast with written instructions. Pt also received written instructions for the MRI. PT verbalized understanding

## 2019-06-01 NOTE — Progress Notes (Signed)
Consult Note: Gyn-Onc  Consult was requested by Dr. Hulan Fray for the evaluation of Emma Medina 48 y.o. female  CC:  Chief Complaint  Patient presents with  . Cervical Cancer    Assessment/Plan:  Emma Medina  is a 48 y.o.  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  I suspect that this is either a locally advanced cervical cancer or the less common squamous cell carcinoma of the endometrium that is extended into the cervix at least stage II.  We will follow-up the results of today's cervical biopsy to help delineate this process.  A CT scan of the chest abdomen and pelvis has been ordered to evaluate for distant metastatic disease which is at a high probability given the apparent involvement of both the uterus and the cervix and this process.  I will also order an MRI of the pelvis to better characterize the primary location, size, and extent (including parametrial involvement) of this mass in order to determine whether a primary surgical approach such as hysterectomy, or primary chemoradiation is most appropriate.  Given her obesity, anticoagulation use, and risk of complicated VTE in recent history with ongoing vascular compromise to the left lower extremity, I am concerned about a radical primary surgical approach in this patient and the morbidity that might follow.  She will see me back after she is completed imaging studies to discuss most appropriate next steps in therapy.  HPI: Emma Medina is a 48 year old P8 who was seen in consultation at the request of Dr. 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  The patient history began in August 2019 when she was diagnosed with a complex left lower extremity DVT that required femoral artery thrombectomy and placement of a stent in the left femoral artery.  She was placed on Xarelto and aspirin for following this procedure.  She had already  been experiencing some irregular menses, however after commencing anticoagulant therapy her menstrual cycle became even heavier and more irregular with daily bleeding.  She cannot remember when she last had a Pap smear possibly in 2018 with her primary care provider [facility].  She denies ever having an abnormal Pap smear.  She developed issues with her left lower extremity in the early part of 2020, and underwent a repeat abdominal aortogram with left lower extremity evaluation on October 26, 2018.  She had been seen in the emergency department in December 2019 for abnormal uterine bleeding where an ultrasound scan had been performed at that time that showed a uterus measuring 11.1 x 6.7 x 6.4 cm with subserosal fibroids and an endometrial thickness of 14 to 16 mm with no focal abnormality visualized.  The left and right ovaries were grossly normal.  She followed up with Dr. Hulan Fray on April 26, 2019 due to persistent abnormal vaginal bleeding.  She was treated with Aygestin to control the bleeding.  An examination was performed no abnormalities on the cervix were noted in the examination findings from April 26, 2019.  An endometrial Pipelle biopsy was performed in the office at that time.  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  Is moderately differentiated.  The patient is obese with a BMI of 36 kg per metered squared.  She is an ex-smoker.  She lives with her son.  She has had 8 prior vaginal deliveries and a tubal ligation.  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  Her only prior abdominal surgery was a tubal ligation.  Current Meds:  Outpatient Encounter Medications as of 06/01/2019  Medication Sig  . aspirin EC 81 MG EC tablet Take 1 tablet (81 mg total) by mouth daily.  . cyclobenzaprine (FLEXERIL) 10 MG tablet Take 10 mg by mouth daily.   . hydrocortisone 2.5 % lotion Apply topically 2 (two) times daily. (Patient taking differently: Apply 1  application topically 2 (two) times daily. Applied to rash on legs)  . megestrol (MEGACE) 40 MG tablet Take 1 tablet (40 mg total) by mouth 2 (two) times daily.  Alveda Reasons 20 MG TABS tablet Take 1 tablet by mouth once daily  . diclofenac sodium (VOLTAREN) 1 % GEL Apply 2 g topically 4 (four) times daily as needed. (Patient not taking: Reported on 06/01/2019)  . hydrOXYzine (ATARAX/VISTARIL) 25 MG tablet Take 2 tablets (50 mg total) by mouth every 6 (six) hours as needed for itching. (Patient not taking: Reported on 05/24/2019)  . lisinopril-hydrochlorothiazide (PRINZIDE,ZESTORETIC) 20-25 MG tablet Take 1 tablet by mouth daily.  . medroxyPROGESTERone (PROVERA) 10 MG tablet Take 10 mg by mouth daily.  . norethindrone (AYGESTIN) 5 MG tablet Take 1 tablet (5 mg total) by mouth 2 (two) times daily. (Patient not taking: Reported on 05/24/2019)   No facility-administered encounter medications on file as of 06/01/2019.     Allergy: No Known Allergies  Social Hx:   Social History   Socioeconomic History  . Marital status: Single    Spouse name: Not on file  . Number of children: Not on file  . Years of education: Not on file  . Highest education level: Not on file  Occupational History  . Not on file  Social Needs  . Financial resource strain: Not on file  . Food insecurity    Worry: Not on file    Inability: Not on file  . Transportation needs    Medical: Not on file    Non-medical: Not on file  Tobacco Use  . Smoking status: Former Smoker    Packs/day: 1.00    Quit date: 07/02/2016    Years since quitting: 2.9  . Smokeless tobacco: Never Used  Substance and Sexual Activity  . Alcohol use: No  . Drug use: Yes    Frequency: 7.0 times per week    Types: Marijuana    Comment: smokes blunt on occasion  . Sexual activity: Not on file  Lifestyle  . Physical activity    Days per week: Not on file    Minutes per session: Not on file  . Stress: Not on file  Relationships  . Social  Herbalist on phone: Not on file    Gets together: Not on file    Attends religious service: Not on file    Active member of club or organization: Not on file    Attends meetings of clubs or organizations: Not on file    Relationship status: Not on file  . Intimate partner violence    Fear of current or ex partner: Not on file    Emotionally abused: Not on file    Physically abused: Not on file    Forced sexual activity: Not on file  Other Topics Concern  . Not on file  Social History Narrative  . Not on file    Past Surgical Hx:  Past Surgical History:  Procedure Laterality Date  . ABDOMINAL AORTOGRAM N/A 05/18/2018   Procedure: ABDOMINAL AORTOGRAM;  Surgeon: Waynetta Sandy, MD;  Location: [facility];  Service: Cardiovascular;  Laterality: N/A;  . ABDOMINAL AORTOGRAM W/LOWER EXTREMITY Left 10/26/2018   Procedure: ABDOMINAL AORTOGRAM W/LOWER EXTREMITY;  Surgeon: Waynetta Sandy, MD;  Location: [facility];  Service: Cardiovascular;  Laterality: Left;  . LOWER EXTREMITY ANGIOGRAPHY Bilateral 05/18/2018   Procedure: LOWER EXTREMITY ANGIOGRAPHY;  Surgeon: Waynetta Sandy, MD;  Location: [facility];  Service: Cardiovascular;  Laterality: Bilateral;  . THROMBECTOMY FEMORAL ARTERY Left 05/18/2018   Procedure: LEFT LEG THROMBECTOMY, BALLOON ANGIOPLASTY LEFT POSTERIOR TIBIAL ARTERY;  Surgeon: Waynetta Sandy, MD;  Location: [facility];  Service: Vascular;  Laterality: Left;  . TUBAL LIGATION      Past Medical Hx:  Past Medical History:  Diagnosis Date  . Cancer ([place])   . Hypertension   . Obstructive thrombus   . Sciatica     Past Gynecological History:  See HPI No LMP recorded. (Menstrual status: Irregular Periods).  Family Hx:  Family History  Problem Relation Age of Onset  . Cancer Mother        brain and uterine  . Stroke Brother   . Diabetes Brother   . Breast cancer Neg Hx     Review of  Systems:  Constitutional  Feels well,   ENT Normal appearing ears and nares bilaterally Skin/Breast  No rash, sores, jaundice, itching, dryness Cardiovascular  No chest pain, shortness of breath, or edema  Pulmonary  No cough or wheeze.  Gastro Intestinal  No nausea, vomitting, or diarrhoea. No bright red blood per rectum, no abdominal pain, change in bowel movement, or constipation.  Genito Urinary  No frequency, urgency, dysuria, + bleeding.  Musculo Skeletal  No myalgia, arthralgia, joint swelling or pain  Neurologic  No weakness, numbness, change in gait,  Psychology  No depression, anxiety, insomnia.   Vitals:  Blood pressure 127/73, pulse 62, temperature 97.8 F (36.6 C), temperature source Tympanic, resp. rate 20, height 5\' 6"  (1.676 m), weight 224 lb (101.6 kg), SpO2 100 %.  Physical Exam: WD in NAD Neck  Supple NROM, without any enlargements.  Lymph Node Survey No cervical supraclavicular or inguinal adenopathy Cardiovascular  Pulse normal rate, regularity and rhythm. S1 and S2 normal.  Lungs  Clear to auscultation bilateraly, without wheezes/crackles/rhonchi. Good air movement.  Skin  No rash/lesions/breakdown  Psychiatry  Alert and oriented to person, place, and time  Abdomen  Normoactive bowel sounds, abdomen soft, non-tender and obese without evidence of hernia.  Back No CVA tenderness Genito Urinary  Vulva/vagina: Normal external female genitalia.  No lesions. No discharge or bleeding.  Bladder/urethra:  No lesions or masses, well supported bladder  Vagina: grossly normal, posterior lip of cervix mass encroaches on posterior vaginal fornix.  Cervix: posterior lip of cervix replaced with 3.5cm cervical mass extending upwards into canal and lower uterine segment. Friable. No apparent parametrial extension.   Uterus:  Slightly bulky, mobile, no parametrial involvement or nodularity.  Adnexa: no discrete masses. Rectal  Good tone, no masses no cul de sac  nodularity.  Extremities  No bilateral cyanosis, clubbing or edema.  Procedure Note:  Preop Dx: cervical mass Postop Dx: same Procedure: cervical biospy Surgeon: Dorann Ou, MD EBL: 10cc Specimens: ectocervix 6 o'clock Complications: none Procedure Details: The patient provided verbal consent and verbal timeout was performed.  Speculum was inserted to the vagina and the cervix and its associated mass was visualized.  A Kevorkian biopsy forcep was used to take a  representative sample from the posterior lip of the cervix in the center of the mass.  Brisk bleeding ensued due to her anticoagulant therapy use.  It was made hemostatic with pressure, silver nitrate, and Monsel solution.  The specimen was sent for histopathology.  The patient tolerated the procedure well.   Thereasa Solo, MD  06/01/2019, 11:05 AM

## 2019-06-01 NOTE — Patient Instructions (Signed)
Dr Denman George sees a mass on the cervix which appears to be a cervical cancer. This will need to be treated with either radiation or surgery depending upon the size of the cancer and whether there is evidence that it has spread.   She has ordered scans (CT scans and MRI) and will see you back for an appointment after these to discuss which treatment is optimal to control this disease.  Her office can be reached at [phone].  It is normal to have bleeding and spotting after today's biopsy.

## 2019-06-05 ENCOUNTER — Other Ambulatory Visit: Payer: Self-pay

## 2019-06-05 ENCOUNTER — Ambulatory Visit (HOSPITAL_COMMUNITY)
Admission: RE | Admit: 2019-06-05 | Discharge: 2019-06-05 | Disposition: A | Discharge status: Home / Self Care | Payer: Self-pay | Source: Ambulatory Visit | Attending: Gynecologic Oncology | Admitting: Gynecologic Oncology

## 2019-06-05 DIAGNOSIS — C538 Malignant neoplasm of overlapping sites of cervix uteri: Secondary | ICD-10-CM | POA: Insufficient documentation

## 2019-06-05 MED ORDER — SODIUM CHLORIDE (PF) 0.9 % IJ SOLN
INTRAMUSCULAR | Status: AC
  Filled 2019-06-05: qty 50

## 2019-06-05 MED ORDER — IOHEXOL 300 MG/ML  SOLN
100.0000 mL | Freq: Once | INTRAMUSCULAR | Status: AC | PRN
  Administered 2019-06-05: 100 mL via INTRAVENOUS

## 2019-06-06 ENCOUNTER — Telehealth: Payer: Self-pay | Admitting: *Deleted

## 2019-06-06 LAB — CYTOLOGY - PAP: HPV: DETECTED — AB

## 2019-06-06 NOTE — Telephone Encounter (Signed)
Called and scheduled the patient for a follow up appt  

## 2019-06-08 ENCOUNTER — Other Ambulatory Visit: Payer: Self-pay

## 2019-06-08 ENCOUNTER — Ambulatory Visit (HOSPITAL_COMMUNITY)
Admission: RE | Admit: 2019-06-08 | Discharge: 2019-06-08 | Disposition: A | Discharge status: Home / Self Care | Payer: Self-pay | Source: Ambulatory Visit | Attending: Gynecologic Oncology | Admitting: Gynecologic Oncology

## 2019-06-08 DIAGNOSIS — C538 Malignant neoplasm of overlapping sites of cervix uteri: Secondary | ICD-10-CM | POA: Insufficient documentation

## 2019-06-08 MED ORDER — GADOBUTROL 1 MMOL/ML IV SOLN
10.0000 mL | Freq: Once | INTRAVENOUS | Status: AC | PRN
  Administered 2019-06-08: 10 mL via INTRAVENOUS

## 2019-06-20 ENCOUNTER — Other Ambulatory Visit: Payer: Self-pay

## 2019-06-20 ENCOUNTER — Encounter: Payer: Self-pay | Admitting: Oncology

## 2019-06-20 ENCOUNTER — Inpatient Hospital Stay: Payer: BLUE CROSS/BLUE SHIELD | Attending: Gynecologic Oncology | Admitting: Gynecologic Oncology

## 2019-06-20 ENCOUNTER — Encounter: Payer: Self-pay | Admitting: Gynecologic Oncology

## 2019-06-20 VITALS — BP 111/76 | HR 81 | Temp 98.3°F | Resp 18 | Ht 66.0 in | Wt 227.0 lb

## 2019-06-20 DIAGNOSIS — Z6836 Body mass index (BMI) 36.0-36.9, adult: Secondary | ICD-10-CM | POA: Insufficient documentation

## 2019-06-20 DIAGNOSIS — E669 Obesity, unspecified: Secondary | ICD-10-CM | POA: Insufficient documentation

## 2019-06-20 DIAGNOSIS — C539 Malignant neoplasm of cervix uteri, unspecified: Secondary | ICD-10-CM

## 2019-06-20 DIAGNOSIS — Z7982 Long term (current) use of aspirin: Secondary | ICD-10-CM | POA: Insufficient documentation

## 2019-06-20 DIAGNOSIS — Z79818 Long term (current) use of other agents affecting estrogen receptors and estrogen levels: Secondary | ICD-10-CM | POA: Diagnosis not present

## 2019-06-20 DIAGNOSIS — C538 Malignant neoplasm of overlapping sites of cervix uteri: Secondary | ICD-10-CM

## 2019-06-20 DIAGNOSIS — M5430 Sciatica, unspecified side: Secondary | ICD-10-CM | POA: Diagnosis not present

## 2019-06-20 DIAGNOSIS — Z7901 Long term (current) use of anticoagulants: Secondary | ICD-10-CM | POA: Insufficient documentation

## 2019-06-20 DIAGNOSIS — Z87891 Personal history of nicotine dependence: Secondary | ICD-10-CM | POA: Diagnosis not present

## 2019-06-20 DIAGNOSIS — Z86718 Personal history of other venous thrombosis and embolism: Secondary | ICD-10-CM | POA: Insufficient documentation

## 2019-06-20 DIAGNOSIS — I10 Essential (primary) hypertension: Secondary | ICD-10-CM | POA: Diagnosis not present

## 2019-06-20 NOTE — Progress Notes (Signed)
Met with Emma Medina after her appointment with Dr. Denman George.  Provided her with the [employer] and advised her to call with any questions.

## 2019-06-20 NOTE — Progress Notes (Signed)
Consult Note: Gyn-Onc  Consult was requested by Dr. Hulan Fray for the evaluation of Emma Medina 48 y.o. female  CC:  Chief Complaint  Patient presents with  . Cervical Cancer    Assessment/Plan:  Emma Medina  is a 48 y.o.  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   I am recommending primary chemoradiation with curative intent. We will facilitate referral to medical and radiation oncology.   Given her obesity, anticoagulation use, and risk of complicated VTE in recent history with ongoing vascular compromise to the left lower extremity, I am concerned about a radical primary surgical approach in this patient and the morbidity that might follow. Therefore I am not recommending interval extrafascial hysterectomy.   She will see me back after she is completed therapy.  HPI: [name] is a 48 year old P8 who was seen in consultation at the request of Dr. 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  The patient history began in August 2019 when she was diagnosed with a complex left lower extremity DVT that required femoral artery thrombectomy and placement of a stent in the left femoral artery.  She was placed on Xarelto and aspirin for following this procedure.  She had already been experiencing some irregular menses, however after commencing anticoagulant therapy her menstrual cycle became even heavier and more irregular with daily bleeding.  She cannot remember when she last had a Pap smear possibly in 2018 with her primary care provider [place].  She denies ever having an abnormal Pap smear.  She developed issues with her left lower extremity in the early part of 2020, and underwent a repeat abdominal aortogram with left lower extremity evaluation on October 26, 2018.  She had been seen in the emergency department in December 2019 for abnormal uterine bleeding where an ultrasound scan had been  performed at that time that showed a uterus measuring 11.1 x 6.7 x 6.4 cm with subserosal fibroids and an endometrial thickness of 14 to 16 mm with no focal abnormality visualized.  The left and right ovaries were grossly normal.  She followed up with Dr. Hulan Fray on April 26, 2019 due to persistent abnormal vaginal bleeding.  She was treated with Aygestin to control the bleeding.  An examination was performed no abnormalities on the cervix were noted in the examination findings from April 26, 2019.  An endometrial Pipelle biopsy was performed in the office at that time.  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  Is moderately differentiated.  The patient is obese with a BMI of 36 kg per metered squared.  She is an ex-smoker.  She lives with her son.  She has had 8 prior vaginal deliveries and a tubal ligation.  She works as a Librarian, academic.  Her only prior abdominal surgery was a tubal ligation.  Interval Hx:   A biopsy of a visible cervical lesion was performed on 06/01/19. This showed squamous cell carcinoma.   CT scan of the abdomen pelvis and chest was performed on June 05, 2019 and revealed no periaortic lymphadenopathy.  In the pelvis there was no gross lymphadenopathy, however several lymph nodes were visible less than 10 mm in short axis.  There is low attenuation within the endometrial canal of the uterine fundus.  The lower uterine segment and cervical regions did not appear to have extension beyond the however there was a rounded lesion  on the anterior wall of the fundus measuring 2 cm corresponding to a fibroid.  An MRI of the pelvis was then performed which confirmed a 4.5 cm endocervical tumor without gross parametrial extension.  This confirmed likely primary cervical site.  Current Meds:  Outpatient Encounter Medications as of 06/20/2019  Medication Sig  . aspirin EC 81 MG EC tablet Take 1 tablet (81 mg total) by mouth daily.  .  cyclobenzaprine (FLEXERIL) 10 MG tablet Take 10 mg by mouth daily.   . hydrocortisone 2.5 % lotion Apply topically 2 (two) times daily. (Patient taking differently: Apply 1 application topically 2 (two) times daily. Applied to rash on legs)  . megestrol (MEGACE) 40 MG tablet Take 1 tablet (40 mg total) by mouth 2 (two) times daily.  Alveda Reasons 20 MG TABS tablet Take 1 tablet by mouth once daily  . [DISCONTINUED] diclofenac sodium (VOLTAREN) 1 % GEL Apply 2 g topically 4 (four) times daily as needed. (Patient not taking: Reported on 06/01/2019)  . [DISCONTINUED] hydrOXYzine (ATARAX/VISTARIL) 25 MG tablet Take 2 tablets (50 mg total) by mouth every 6 (six) hours as needed for itching. (Patient not taking: Reported on 05/24/2019)  . [DISCONTINUED] lisinopril-hydrochlorothiazide (PRINZIDE,ZESTORETIC) 20-25 MG tablet Take 1 tablet by mouth daily.  . [DISCONTINUED] medroxyPROGESTERone (PROVERA) 10 MG tablet Take 10 mg by mouth daily.  . [DISCONTINUED] norethindrone (AYGESTIN) 5 MG tablet Take 1 tablet (5 mg total) by mouth 2 (two) times daily. (Patient not taking: Reported on 05/24/2019)   No facility-administered encounter medications on file as of 06/20/2019.     Allergy: No Known Allergies  Social Hx:   Social History   Socioeconomic History  . Marital status: Single    Spouse name: Not on file  . Number of children: Not on file  . Years of education: Not on file  . Highest education level: Not on file  Occupational History  . Not on file  Social Needs  . Financial resource strain: Not on file  . Food insecurity    Worry: Not on file    Inability: Not on file  . Transportation needs    Medical: Not on file    Non-medical: Not on file  Tobacco Use  . Smoking status: Former Smoker    Packs/day: 1.00    Quit date: 07/02/2016    Years since quitting: 2.9  . Smokeless tobacco: Never Used  Substance and Sexual Activity  . Alcohol use: No  . Drug use: Yes    Frequency: 7.0 times per week     Types: Marijuana    Comment: smokes blunt on occasion  . Sexual activity: Not on file  Lifestyle  . Physical activity    Days per week: Not on file    Minutes per session: Not on file  . Stress: Not on file  Relationships  . Social Herbalist on phone: Not on file    Gets together: Not on file    Attends religious service: Not on file    Active member of club or organization: Not on file    Attends meetings of clubs or organizations: Not on file    Relationship status: Not on file  . Intimate partner violence    Fear of current or ex partner: Not on file    Emotionally abused: Not on file    Physically abused: Not on file    Forced sexual activity: Not on file  Other Topics Concern  . Not on file  Social History Narrative  . Not on file    Past Surgical Hx:  Past Surgical History:  Procedure Laterality Date  . ABDOMINAL AORTOGRAM N/A 05/18/2018   Procedure: ABDOMINAL AORTOGRAM;  Surgeon: Waynetta Sandy, MD;  Location: [facility];  Service: Cardiovascular;  Laterality: N/A;  . ABDOMINAL AORTOGRAM W/LOWER EXTREMITY Left 10/26/2018   Procedure: ABDOMINAL AORTOGRAM W/LOWER EXTREMITY;  Surgeon: Waynetta Sandy, MD;  Location: [facility];  Service: Cardiovascular;  Laterality: Left;  . LOWER EXTREMITY ANGIOGRAPHY Bilateral 05/18/2018   Procedure: LOWER EXTREMITY ANGIOGRAPHY;  Surgeon: Waynetta Sandy, MD;  Location: [facility];  Service: Cardiovascular;  Laterality: Bilateral;  . THROMBECTOMY FEMORAL ARTERY Left 05/18/2018   Procedure: LEFT LEG THROMBECTOMY, BALLOON ANGIOPLASTY LEFT POSTERIOR TIBIAL ARTERY;  Surgeon: Waynetta Sandy, MD;  Location: [facility];  Service: Vascular;  Laterality: Left;  . TUBAL LIGATION      Past Medical Hx:  Past Medical History:  Diagnosis Date  . Cancer ([place])   . Hypertension   . Obstructive thrombus   . Sciatica     Past Gynecological History:  See HPI No LMP recorded.  (Menstrual status: Irregular Periods).  Family Hx:  Family History  Problem Relation Age of Onset  . Cancer Mother        brain and uterine  . Stroke Brother   . Diabetes Brother   . Breast cancer Neg Hx     Review of Systems:  Constitutional  Feels well,   ENT Normal appearing ears and nares bilaterally Skin/Breast  No rash, sores, jaundice, itching, dryness Cardiovascular  No chest pain, shortness of breath, or edema  Pulmonary  No cough or wheeze.  Gastro Intestinal  No nausea, vomitting, or diarrhoea. No bright red blood per rectum, no abdominal pain, change in bowel movement, or constipation.  Genito Urinary  No frequency, urgency, dysuria, + bleeding.  Musculo Skeletal  No myalgia, arthralgia, joint swelling or pain  Neurologic  No weakness, numbness, change in gait,  Psychology  No depression, anxiety, insomnia.   Vitals:  Blood pressure 111/76, pulse 81, temperature 98.3 F (36.8 C), temperature source Oral, resp. rate 18, height 5\' 6"  (1.676 m), weight 227 lb (103 kg), SpO2 100 %.  Physical Exam: WD in NAD Neck  Supple NROM, without any enlargements.  Lymph Node Survey No cervical supraclavicular or inguinal adenopathy Cardiovascular  Pulse normal rate, regularity and rhythm. S1 and S2 normal.  Lungs  Clear to auscultation bilateraly, without wheezes/crackles/rhonchi. Good air movement.  Skin  No rash/lesions/breakdown  Psychiatry  Alert and oriented to person, place, and time  Abdomen  Normoactive bowel sounds, abdomen soft, non-tender and obese without evidence of hernia.  Back No CVA tenderness Genito Urinary  Vulva/vagina: Normal external female genitalia.  No lesions. No discharge or bleeding.  Bladder/urethra:  No lesions or masses, well supported bladder  Vagina: grossly normal, posterior lip of cervix mass encroaches on posterior vaginal fornix.  Cervix: posterior lip of cervix replaced with 3.5cm cervical mass extending upwards into canal  and lower uterine segment. Friable. No apparent parametrial extension.   Uterus:  Slightly bulky, mobile, no parametrial involvement or nodularity.  Adnexa: no discrete masses. Rectal  Good tone, no masses no cul de sac nodularity.  Extremities  No bilateral cyanosis, clubbing or edema.   Thereasa Solo, MD  06/20/2019, 4:54 PM

## 2019-06-20 NOTE — Patient Instructions (Signed)
Review of your scans and biopsies suggest that this cancer is a stage IB3 cervical cancer. The treatment of this cancer is with radiation and weekly chemotherapy. We will facilitate appointments with you to see the radiation and chemotherapy doctors.  The chance of cure with this therapy is >90%.

## 2019-06-21 ENCOUNTER — Telehealth: Payer: Self-pay | Admitting: Oncology

## 2019-06-21 ENCOUNTER — Other Ambulatory Visit: Payer: Self-pay | Admitting: Hematology and Oncology

## 2019-06-21 DIAGNOSIS — C538 Malignant neoplasm of overlapping sites of cervix uteri: Secondary | ICD-10-CM

## 2019-06-21 NOTE — Telephone Encounter (Signed)
Called patient with appointment for PET scan on 06/28/19 at 7 am (6:30 arrival, nothing to eat after midnight, water only) and to see Dr. Alvy Bimler at 2 pm.  She verbalized understanding and agreement.

## 2019-06-22 NOTE — Progress Notes (Signed)
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   Emma Medina presented with symptoms of: The patient history began in August 2019 when she was diagnosed with a complex left lower extremity DVT that required femoral artery thrombectomy and placement of a stent in the left femoral artery.  She was placed on Xarelto and aspirin for following this procedure.  She had already been experiencing some irregular menses, however after commencing anticoagulant therapy her menstrual cycle became even heavier and more irregular with daily bleeding.  She cannot remember when she last had a Pap smear possibly in 2018 with her primary care provider Emma Medina.  She denies ever having an abnormal Pap smear.  She developed issues with her left lower extremity in the early part of 2020, and underwent a repeat abdominal aortogram with left lower extremity evaluation on October 26, 2018.  She had been seen in the emergency department in December 2019 for abnormal uterine bleeding where an ultrasound scan had been performed at that time that showed a uterus measuring 11.1 x 6.7 x 6.4 cm with subserosal fibroids and an endometrial thickness of 14 to 16 mm with no focal abnormality visualized.  The left and right ovaries were grossly normal.  She followed up with Emma Medina on April 26, 2019 due to persistent abnormal vaginal bleeding.  She was treated with Aygestin to control the bleeding.  An examination was performed no abnormalities on the cervix were noted in the examination findings from April 26, 2019.  An endometrial Pipelle biopsy was performed in the office at that time.  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  Is moderately differentiated.  Biopsies revealed: 06/01/19: Diagnosis Cervix, biopsy, ectocervix 6 o'clock posterior - SQUAMOUS CELL CARCINOMA WITH ULCERATION AND GRANULATION TISSUE Diagnosis SQUAMOUS  CELL CARCINOMA.Abnormal    Diagnosis THE FINDINGS CORRELATE WITH THE BIOPSY [phone]).Abnormal    HPV DETECTEDAbnormal    Comment: Normal Reference Range - NOT Detected  Material Submitted CervicoVaginal Pap [ThinPrep Imaged]Abnormal      Past/Anticipated interventions by Gyn/Onc surgery, if any: Per Emma Medina 06/20/19: I am recommending primary chemoradiation with curative intent. We will facilitate referral to medical and radiation oncology.   Given her obesity, anticoagulation use, and risk of complicated VTE in recent history with ongoing vascular compromise to the left lower extremity, I am concerned about a radical primary surgical approach in this patient and the morbidity that might follow. Therefore I am not recommending interval extrafascial hysterectomy.   She will see me back after she is completed therapy.   Past/Anticipated interventions by medical oncology, if any: Initial consult with Emma Medina 06/28/19  Weight changes, if any:  Wt Readings from Last 3 Encounters:  06/27/19 230 lb 2 oz (104.4 kg)  06/20/19 227 lb (103 kg)  06/01/19 224 lb (101.6 kg)     Bowel/Bladder complaints, if any: Pt denies dysuria/hematuria. Pt reports occasional vaginal spotting. Pt denies rectal bleeding, diarrhea/constipation.  Nausea/Vomiting, if any: Pt denies abdominal bloating, N/V.   Pain issues, if any:  Pt denies c/o pain.  SAFETY ISSUES:  Prior radiation? No  Pacemaker/ICD? No  Possible current pregnancy? No  Is the patient on methotrexate? No  Current Complaints / other details:  Pt presents today for initial consult with Emma Medina for Radiation Oncology.   BP 123/86 (Patient Position: Sitting)   Pulse 60   Temp 98.3 F (36.8 C) (Temporal)   Resp  18   Ht 5\' 6"  (1.676 m)   Wt 230 lb 2 oz (104.4 kg)   SpO2 100%   BMI 37.14 kg/m   Loma Sousa, RN BSN

## 2019-06-27 ENCOUNTER — Ambulatory Visit
Admission: RE | Admit: 2019-06-27 | Discharge: 2019-06-27 | Disposition: A | Discharge status: Home / Self Care | Payer: BLUE CROSS/BLUE SHIELD | Source: Ambulatory Visit | Attending: Radiation Oncology | Admitting: Radiation Oncology

## 2019-06-27 ENCOUNTER — Other Ambulatory Visit: Payer: Self-pay

## 2019-06-27 ENCOUNTER — Encounter: Payer: Self-pay | Admitting: Radiation Oncology

## 2019-06-27 ENCOUNTER — Ambulatory Visit
Admission: RE | Admit: 2019-06-27 | Discharge: 2019-06-27 | Disposition: A | Discharge status: Home / Self Care | Payer: BLUE CROSS/BLUE SHIELD | Source: Ambulatory Visit | Attending: Hematology and Oncology | Admitting: Hematology and Oncology

## 2019-06-27 VITALS — BP 123/86 | HR 60 | Temp 98.3°F | Resp 18 | Ht 66.0 in | Wt 230.1 lb

## 2019-06-27 DIAGNOSIS — I10 Essential (primary) hypertension: Secondary | ICD-10-CM | POA: Insufficient documentation

## 2019-06-27 DIAGNOSIS — C539 Malignant neoplasm of cervix uteri, unspecified: Secondary | ICD-10-CM | POA: Diagnosis not present

## 2019-06-27 DIAGNOSIS — C538 Malignant neoplasm of overlapping sites of cervix uteri: Secondary | ICD-10-CM

## 2019-06-27 DIAGNOSIS — Z87891 Personal history of nicotine dependence: Secondary | ICD-10-CM | POA: Diagnosis not present

## 2019-06-27 DIAGNOSIS — Z7901 Long term (current) use of anticoagulants: Secondary | ICD-10-CM | POA: Insufficient documentation

## 2019-06-27 DIAGNOSIS — Z809 Family history of malignant neoplasm, unspecified: Secondary | ICD-10-CM | POA: Insufficient documentation

## 2019-06-27 DIAGNOSIS — Z79899 Other long term (current) drug therapy: Secondary | ICD-10-CM | POA: Diagnosis not present

## 2019-06-27 NOTE — Patient Instructions (Signed)
Coronavirus (COVID-19) Are you at risk?  Are you at risk for the Coronavirus (COVID-19)?  To be considered HIGH RISK for Coronavirus (COVID-19), you have to meet the following criteria:  . Traveled to [place], [place], [place], [place] or [place]; or in the [place] to [place], [place], [place], or [place]; and have fever, cough, and shortness of breath within the last 2 weeks of travel OR . Been in close contact with a person diagnosed with COVID-19 within the last 2 weeks and have fever, cough, and shortness of breath . IF YOU DO NOT MEET THESE CRITERIA, YOU ARE CONSIDERED LOW RISK FOR COVID-19.  What to do if you are HIGH RISK for COVID-19?  . If you are having a medical emergency, call 911. . Seek medical care right away. Before you go to a doctor's office, urgent care or emergency department, call ahead and tell them about your recent travel, contact with someone diagnosed with COVID-19, and your symptoms. You should receive instructions from your physician's office regarding next steps of care.  . When you arrive at healthcare provider, tell the healthcare staff immediately you have returned from visiting [place], [place], [place], [place] or [place]; or traveled in the [place] to [place], [place], [place], or [place]; in the last two weeks or you have been in close contact with a person diagnosed with COVID-19 in the last 2 weeks.   . Tell the health care staff about your symptoms: fever, cough and shortness of breath. . After you have been seen by a medical provider, you will be either: o Tested for (COVID-19) and discharged home on quarantine except to seek medical care if symptoms worsen, and asked to  - Stay home and avoid contact with others until you get your results (4-5 days)  - Avoid travel on public transportation if possible (such as bus, train, or airplane) or o Sent to the Emergency Department by EMS for evaluation, COVID-19 testing, and possible  admission depending on your condition and test results.  What to do if you are LOW RISK for COVID-19?  Reduce your risk of any infection by using the same precautions used for avoiding the common cold or flu:  . Wash your hands often with soap and warm water for at least 20 seconds.  If soap and water are not readily available, use an alcohol-based hand sanitizer with at least 60% alcohol.  . If coughing or sneezing, cover your mouth and nose by coughing or sneezing into the elbow areas of your shirt or coat, into a tissue or into your sleeve (not your hands). . Avoid shaking hands with others and consider head nods or verbal greetings only. . Avoid touching your eyes, nose, or mouth with unwashed hands.  . Avoid close contact with people who are sick. . Avoid places or events with large numbers of people in one location, like concerts or sporting events. . Carefully consider travel plans you have or are making. . If you are planning any travel outside or inside the [place], visit the CDC's Travelers' Health webpage for the latest health notices. . If you have some symptoms but not all symptoms, continue to monitor at home and seek medical attention if your symptoms worsen. . If you are having a medical emergency, call 911.   ADDITIONAL HEALTHCARE OPTIONS FOR PATIENTS  [facility] / e-Visit: [email or web address]         [facility]: [phone]  [facility]: [phone]                   [facility]: [phone]   

## 2019-06-27 NOTE — Progress Notes (Addendum)
Radiation Oncology         [phone] ________________________________  Initial Outpatient Consultation  Name: Emma Medina MRN: [id number]  Date: 06/27/2019  DOB: January 14, 1971  [id number], Audrea Muscat, NP  Placey, Audrea Muscat, NP   REFERRING PHYSICIAN: Synthia Innocent Audrea Muscat, NP  DIAGNOSIS: The encounter diagnosis was Malignant neoplasm of overlapping sites of cervix [facility]).   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 female who is accompanied by no one due to COVID-19 restrictions. The patient presented to the ED with abnormal uterine bleeding on 09/20/2018. Pelvic ultrasound performed at the time showed a small fibroid. She was seen by Dr. Hulan Fray on 04/26/2019, who performed uterine biopsy. Pathology from the procedure showed invasive moderately differentiated squamous cell carcinoma.  She was referred to Dr. Denman George on 06/01/2019, who ordered staging scans and performed cervical biopsy. Pathology from the biopsy confirmed squamous cell carcinoma with ulceration and granulation tissue.  Chest/abdomen/pelvis CT was performed on 06/05/2019 and showed: no extension beyond the myometrium of the uterus, lower uterine segment, or cervix; no clear evidence of metastatic disease in the pelvis, or of distant metastatic disease or abdominal adenopathy, or of skeletal metastasis.  Pelvis MRI performed on 06/08/2019 revealed: 4.4 cm barrel-shaped soft tissue mass in the cervix, consistent with known cervical carcinoma; no evidence of parametrial involvement or other pelvic metastatic disease; FIGO stage IB3 by imaging.  Per Dr. Serita Grit note on 06/20/2019, the patient is not a good candidate for surgery given her obesity, anticoagulation use, and risk of complicated VTE in recent history with ongoing vascular compromise to the left lower extremity. Dr. Denman George recommends primary chemoradiation with curative intent.  She is scheduled to undergo PET scan  tomorrow, 06/28/2019, followed by consult with Dr. Alvy Bimler.   PREVIOUS RADIATION THERAPY: No  PAST MEDICAL HISTORY:  Past Medical History:  Diagnosis Date   Cancer ([place])    Hypertension    Obstructive thrombus    Sciatica     PAST SURGICAL HISTORY: Past Surgical History:  Procedure Laterality Date   ABDOMINAL AORTOGRAM N/A 05/18/2018   Procedure: ABDOMINAL AORTOGRAM;  Surgeon: Waynetta Sandy, MD;  Location: [place] [facility];  Service: Cardiovascular;  Laterality: N/A;   ABDOMINAL AORTOGRAM W/LOWER EXTREMITY Left 10/26/2018   Procedure: ABDOMINAL AORTOGRAM W/LOWER EXTREMITY;  Surgeon: Waynetta Sandy, MD;  Location: [place] [facility];  Service: Cardiovascular;  Laterality: Left;   LOWER EXTREMITY ANGIOGRAPHY Bilateral 05/18/2018   Procedure: LOWER EXTREMITY ANGIOGRAPHY;  Surgeon: Waynetta Sandy, MD;  Location: [place] [facility];  Service: Cardiovascular;  Laterality: Bilateral;   THROMBECTOMY FEMORAL ARTERY Left 05/18/2018   Procedure: LEFT LEG THROMBECTOMY, BALLOON ANGIOPLASTY LEFT POSTERIOR TIBIAL ARTERY;  Surgeon: Waynetta Sandy, MD;  Location[facility];  Service: Vascular;  Laterality: Left;   TUBAL LIGATION      FAMILY HISTORY:  Family History  Problem Relation Age of Onset   Cancer Mother        brain and uterine   Stroke Brother    Diabetes Brother    Breast cancer Neg Hx     SOCIAL HISTORY:  Social History   Tobacco Use   Smoking status: Former Smoker    Packs/day: 1.00    Quit date: 07/02/2016    Years since quitting: 2.9   Smokeless tobacco: Never Used  Substance Use Topics   Alcohol use: No   Drug use: Yes    Frequency: 7.0  times per week    Types: Marijuana    Comment: smokes blunt on occasion    ALLERGIES: No Known Allergies  MEDICATIONS:  Current Outpatient Medications  Medication Sig Dispense Refill   aspirin EC 81 MG EC tablet Take 1 tablet (81 mg total) by mouth daily.      cyclobenzaprine (FLEXERIL) 10 MG tablet Take 10 mg by mouth daily.      hydrocortisone 2.5 % lotion Apply topically 2 (two) times daily. (Patient taking differently: Apply 1 application topically 2 (two) times daily. Applied to rash on legs) 118 mL 0   megestrol (MEGACE) 40 MG tablet Take 1 tablet (40 mg total) by mouth 2 (two) times daily. 60 tablet 5   XARELTO 20 MG TABS tablet Take 1 tablet by mouth once daily 30 tablet 6   No current facility-administered medications for this encounter.     REVIEW OF SYSTEMS:  A 10+ POINT REVIEW OF SYSTEMS WAS OBTAINED including neurology, dermatology, psychiatry, cardiac, respiratory, lymph, extremities, GI, GU, musculoskeletal, constitutional, reproductive, HEENT. She reports occasional vaginal spotting. She denies dysuria or hematuria, rectal bleeding, diarrhea or constipation, abdominal bloating, nausea or vomiting, and any pain.  She reports postcoital bleeding.   PHYSICAL EXAM:  height is 5\' 6"  (1.676 m) and weight is 230 lb 2 oz (104.4 kg). Her temporal temperature is 98.3 F (36.8 C). Her blood pressure is 123/86 and her pulse is 60. Her respiration is 18 and oxygen saturation is 100%.   General: Alert and oriented, in no acute distress HEENT: Head is normocephalic. Extraocular movements are intact. Oropharynx is clear. Neck: Neck is supple, no palpable cervical or supraclavicular lymphadenopathy. Heart: Regular in rate and rhythm with no murmurs, rubs, or gallops. Chest: Clear to auscultation bilaterally, with no rhonchi, wheezes, or rales. Abdomen: Soft, nontender, nondistended, with no rigidity or guarding. Extremities: No cyanosis or edema.  Scars noted along the left lower extremity from her  vascular surgery Lymphatics: see Neck Exam Skin: No concerning lesions. Musculoskeletal: symmetric strength and muscle tone throughout. Neurologic: Cranial nerves II through XII are grossly intact. No obvious focalities. Speech is fluent. Coordination  is intact. Psychiatric: Judgment and insight are intact. Affect is appropriate. On pelvic examination the external genitalia were unremarkable. A speculum exam was performed. The  cervix is replaced with a 3.5 cm friable cervical mass without parametrial extension.  Rectovaginal exam confirms.  rectal sphincter tone normal.   ECOG = 1  0 - Asymptomatic (Fully active, able to carry on all predisease activities without restriction)  1 - Symptomatic but completely ambulatory (Restricted in physically strenuous activity but ambulatory and able to carry out work of a light or sedentary nature. For example, light housework, office work)  2 - Symptomatic, <50% in bed during the day (Ambulatory and capable of all self care but unable to carry out any work activities. Up and about more than 50% of waking hours)  3 - Symptomatic, >50% in bed, but not bedbound (Capable of only limited self-care, confined to bed or chair 50% or more of waking hours)  4 - Bedbound (Completely disabled. Cannot carry on any self-care. Totally confined to bed or chair)  5 - Death   Eustace Pen MM, Creech RH, Tormey DC, et al. [phone]). "Toxicity and response criteria of the [facility]". [place] Oncol. 5 (6): 649-55  LABORATORY DATA:  Lab Results  Component Value Date   WBC 6.9 06/01/2019   HGB 11.6 (L) 06/01/2019   HCT  37.2 06/01/2019   MCV 75.2 (L) 06/01/2019   PLT 329 06/01/2019   NEUTROABS 2.8 06/01/2019   Lab Results  Component Value Date   NA 142 06/01/2019   K 4.5 06/01/2019   CL 110 06/01/2019   CO2 23 06/01/2019   GLUCOSE 107 (H) 06/01/2019   CREATININE 0.90 06/01/2019   CALCIUM 9.3 06/01/2019      RADIOGRAPHY: Ct Chest W Contrast  Result Date: 06/05/2019 CLINICAL DATA:  Endometrial carcinoma. Gross cervical involvement suspected. New diagnosis. EXAM: CT CHEST, ABDOMEN, AND PELVIS WITH CONTRAST TECHNIQUE: Multidetector CT imaging of the chest, abdomen and pelvis was performed  following the standard protocol during bolus administration of intravenous contrast. CONTRAST:  170mL OMNIPAQUE IOHEXOL 300 MG/ML  SOLN COMPARISON:  None. FINDINGS: CT CHEST FINDINGS Cardiovascular: No significant vascular findings. Normal heart size. No pericardial effusion. Mediastinum/Nodes: No axillary or supraclavicular adenopathy. No mediastinal or hilar adenopathy. No pericardial effusion esophagus normal Lungs/Pleura: Small subpleural nodule in the RIGHT upper lobe measures 3 mm (image 47/6). No suspicious pulmonary nodules otherwise. Musculoskeletal: No aggressive osseous lesion. CT ABDOMEN AND PELVIS FINDINGS Hepatobiliary: No focal hepatic lesion. Multiple stones packed the lumen of the gallbladder. No inflammation. Common bile duct normal. Pancreas: Pancreas is normal. No ductal dilatation. No pancreatic inflammation. Spleen: Normal spleen Adrenals/urinary tract: Adrenal glands and kidneys are normal. The ureters and bladder normal. Stomach/Bowel: Stomach, small bowel, appendix, and cecum are normal. The colon and rectosigmoid colon are normal. Vascular/Lymphatic: Abdominal aorta normal caliber. No periaortic retroperitoneal adenopathy. No periportal adenopathy. In the pelvis, no iliac lymphadenopathy. several lymph nodes which are less than 10 mm short axis. Example RIGHT operator node measures 5 mm (image 106/2). Similar 4 mm node on the LEFT on image 26/2. Reproductive: There is low attenuation within the endometrial canal of the uterine fundus. There is no evidence of gross extension beyond the myometrium of the lower uterine segment or cervical uterus. There is a rounded lesion along the anterior wall the fundus measuring 2 cm which corresponds to leiomyoma on comparison ultrasound. The endometrium is poorly imaged by CT. Other: No free fluid Musculoskeletal: No aggressive osseous lesion. IMPRESSION: Chest Impression: 1. No evidence of thoracic metastasis. 2. Small RIGHT upper lobe pulmonary nodule  favored benign. Abdomen / Pelvis Impression: 1. No direct extension beyond the myometrium of the uterus, lower uterine segment or cervix. 2. No clear evidence metastatic adenopathy in the pelvis. Several subcentimeter iliac lymph nodes are noted. 3. No evidence distant metastatic disease or adenopathy in the abdomen pelvis. 4. Uterus is poorly evaluated by CT. Probable leiomyoma in the anterior wall. 5. No skeletal metastasis. Electronically Signed   By: Suzy Bouchard M.D.   On: 06/05/2019 16:58   Mr Pelvis W [id number] Contrast  Result Date: 06/11/2019 CLINICAL DATA:  Newly diagnosed cervical squamous cell carcinoma. EXAM: MRI PELVIS WITHOUT AND WITH CONTRAST TECHNIQUE: Multiplanar multisequence MR imaging of the pelvis was performed both before and after administration of intravenous contrast. CONTRAST:  10 mL Gadavist COMPARISON:  None. FINDINGS: Lower Urinary Tract: No urinary bladder or urethral abnormality identified. Bowel: Unremarkable appearance of rectum and other pelvic bowel loops. Vascular/Lymphatic: Unremarkable. No pathologically enlarged pelvic lymph nodes identified. Reproductive: -- Uterus: Measures 12.7 x 6.8 x 6.8 cm (volume = 310 cm^3). An intramural fibroid is seen in the left anterior corpus which measures 2.8 x 2.6 cm. Another tiny subserosal fibroid is seen in the fundus measuring approximately 1.7 cm. In addition, there is ill-defined thickening of the myometrial junctional  zone in the uterine fundus with a few tiny cystic foci, consistent with adenomyosis. Mild hematometros is seen. A barrel-shaped hypoenhancing soft tissue mass is seen in the cervix which measures 4.5 by 2.8 x 2.4 cm, consistent with cervical carcinoma. This appears to show deep invasion of the cervical fibrous stroma greater than 5 mm. No evidence of extra-uterine extension into the parametrial soft tissues or vagina. -- Right ovary: Appears normal. No mass or inflammatory process identified. -- Left ovary: Appears normal.  No mass or inflammatory process identified. Other: No peritoneal thickening or abnormal free fluid. Musculoskeletal:  Unremarkable. IMPRESSION: 4.4 cm barrel shaped soft tissue mass in the cervix, consistent with known cervical carcinoma. No evidence of parametrial involvement or other pelvic metastatic disease. FIGO stage IB3 by imaging. Mild hydrometros. Small uterine fibroids and adenomyosis. Normal appearance of both ovaries.  No adnexal mass identified. Electronically Signed   By: Marlaine Hind M.D.   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 New diagnosis. EXAM: CT CHEST, ABDOMEN, AND PELVIS WITH CONTRAST TECHNIQUE: Multidetector CT imaging of the chest, abdomen and pelvis was performed following the standard protocol during bolus administration of intravenous contrast. CONTRAST:  161mL OMNIPAQUE IOHEXOL 300 MG/ML  SOLN COMPARISON:  None. FINDINGS: CT CHEST FINDINGS Cardiovascular: No significant vascular findings. Normal heart size. No pericardial effusion. Mediastinum/Nodes: No axillary or supraclavicular adenopathy. No mediastinal or hilar adenopathy. No pericardial effusion esophagus normal Lungs/Pleura: Small subpleural nodule in the RIGHT upper lobe measures 3 mm (image 47/6). No suspicious pulmonary nodules otherwise. Musculoskeletal: No aggressive osseous lesion. CT ABDOMEN AND PELVIS FINDINGS Hepatobiliary: No focal hepatic lesion. Multiple stones packed the lumen of the gallbladder. No inflammation. Common bile duct normal. Pancreas: Pancreas is normal. No ductal dilatation. No pancreatic inflammation. Spleen: Normal spleen Adrenals/urinary tract: Adrenal glands and kidneys are normal. The ureters and bladder normal. Stomach/Bowel: Stomach, small bowel, appendix, and cecum are normal. The colon and rectosigmoid colon are normal. Vascular/Lymphatic: Abdominal aorta normal caliber. No  periaortic retroperitoneal adenopathy. No periportal adenopathy. In the pelvis, no iliac lymphadenopathy. several lymph nodes which are less than 10 mm short axis. Example RIGHT operator node measures 5 mm (image 106/2). Similar 4 mm node on the LEFT on image 26/2. Reproductive: There is low attenuation within the endometrial canal of the uterine fundus. There is no evidence of gross extension beyond the myometrium of the lower uterine segment or cervical uterus. There is a rounded lesion along the anterior wall the fundus measuring 2 cm which corresponds to leiomyoma on comparison ultrasound. The endometrium is poorly imaged by CT. Other: No free fluid Musculoskeletal: No aggressive osseous lesion. IMPRESSION: Chest Impression: 1. No evidence of thoracic metastasis. 2. Small RIGHT upper lobe pulmonary nodule favored benign. Abdomen / Pelvis Impression: 1. No direct extension beyond the myometrium of the uterus, lower uterine segment or cervix. 2. No clear evidence metastatic adenopathy in the pelvis. Several subcentimeter iliac lymph nodes are noted. 3. No evidence distant metastatic disease or adenopathy in the abdomen pelvis. 4. Uterus is poorly evaluated by CT. Probable leiomyoma in the anterior wall. 5. No skeletal metastasis. Electronically Signed   By: Suzy Bouchard M.D.   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 Radiation therapy would also  include 5 high-dose rate intracavitary treatments. The patient, after hearing discussion of her comprehensive treatment, is still wishing she could have surgery. I discussed that with the patient the size of her tumor, vascular issues, and the need for chronic anticoagulation, she would be at risk for significant complications from surgery. She would again like to talk with Dr. Denman George  concerning potential surgery. She said a phone consultation would be fine, and we will arrange this within the next few days.   Today, I talked to the patient about the findings and work-up thus far.  We discussed the natural history of cervical cancer and general treatment, highlighting the role of radiotherapy in the management.  We discussed the available radiation techniques, and focused on the details of logistics and delivery.  We reviewed the anticipated acute and late sequelae associated with radiation in this setting.  The patient was encouraged to ask questions that I answered to the best of my ability.  A patient consent form was discussed and signed.  We retained a copy for our records.  The patient would like to proceed with radiation and will be scheduled for CT simulation.  PLAN:  Patient will proceed with PET scan tomorrow and will see Dr. Alvy Bimler for evaluation and discussion of radiosensitizing chemotherapy. Patient is scheduled for CT simulation on Monday, 07/02/2019, with treatments to begin 10/5 or 07/10/2019 along with radiosensitizing chemotherapy.     ------------------------------------------------  Blair Promise, PhD, MD  This document serves as a record of services personally performed by Gery Pray, MD. It was created on his behalf by Wilburn Mylar, a trained medical scribe. The creation of this record is based on the scribe's personal observations and the provider's statements to them. This document has been checked and approved by the attending provider.

## 2019-06-28 ENCOUNTER — Encounter: Payer: Self-pay | Admitting: Hematology and Oncology

## 2019-06-28 ENCOUNTER — Inpatient Hospital Stay (HOSPITAL_BASED_OUTPATIENT_CLINIC_OR_DEPARTMENT_OTHER): Payer: BLUE CROSS/BLUE SHIELD | Admitting: Hematology and Oncology

## 2019-06-28 ENCOUNTER — Other Ambulatory Visit: Payer: Self-pay | Admitting: Hematology and Oncology

## 2019-06-28 ENCOUNTER — Other Ambulatory Visit: Payer: Self-pay

## 2019-06-28 ENCOUNTER — Telehealth: Payer: Self-pay | Admitting: Oncology

## 2019-06-28 ENCOUNTER — Ambulatory Visit (HOSPITAL_COMMUNITY)
Admission: RE | Admit: 2019-06-28 | Discharge: 2019-06-28 | Disposition: A | Discharge status: Home / Self Care | Payer: BLUE CROSS/BLUE SHIELD | Source: Ambulatory Visit | Attending: Hematology and Oncology | Admitting: Hematology and Oncology

## 2019-06-28 VITALS — BP 125/83 | HR 67 | Temp 98.5°F | Resp 18 | Ht 66.0 in | Wt 229.8 lb

## 2019-06-28 DIAGNOSIS — Z87891 Personal history of nicotine dependence: Secondary | ICD-10-CM

## 2019-06-28 DIAGNOSIS — C538 Malignant neoplasm of overlapping sites of cervix uteri: Secondary | ICD-10-CM | POA: Diagnosis not present

## 2019-06-28 DIAGNOSIS — M5430 Sciatica, unspecified side: Secondary | ICD-10-CM

## 2019-06-28 DIAGNOSIS — C539 Malignant neoplasm of cervix uteri, unspecified: Secondary | ICD-10-CM | POA: Diagnosis not present

## 2019-06-28 DIAGNOSIS — Z6836 Body mass index (BMI) 36.0-36.9, adult: Secondary | ICD-10-CM

## 2019-06-28 DIAGNOSIS — Z7901 Long term (current) use of anticoagulants: Secondary | ICD-10-CM

## 2019-06-28 DIAGNOSIS — I739 Peripheral vascular disease, unspecified: Secondary | ICD-10-CM | POA: Insufficient documentation

## 2019-06-28 DIAGNOSIS — E669 Obesity, unspecified: Secondary | ICD-10-CM

## 2019-06-28 DIAGNOSIS — Z79818 Long term (current) use of other agents affecting estrogen receptors and estrogen levels: Secondary | ICD-10-CM

## 2019-06-28 DIAGNOSIS — Z86718 Personal history of other venous thrombosis and embolism: Secondary | ICD-10-CM

## 2019-06-28 DIAGNOSIS — Z7982 Long term (current) use of aspirin: Secondary | ICD-10-CM

## 2019-06-28 DIAGNOSIS — I10 Essential (primary) hypertension: Secondary | ICD-10-CM

## 2019-06-28 DIAGNOSIS — D500 Iron deficiency anemia secondary to blood loss (chronic): Secondary | ICD-10-CM

## 2019-06-28 LAB — GLUCOSE, CAPILLARY: Glucose-Capillary: 142 mg/dL — ABNORMAL HIGH (ref 70–99)

## 2019-06-28 MED ORDER — FLUDEOXYGLUCOSE F - 18 (FDG) INJECTION
13.0000 | Freq: Once | INTRAVENOUS | Status: AC | PRN
  Administered 2019-06-28: 13 via INTRAVENOUS

## 2019-06-28 MED ORDER — ONDANSETRON HCL 8 MG PO TABS: 8.0000 mg | ORAL_TABLET | Freq: Three times a day (TID) | ORAL | 1 refills | Status: DC | PRN

## 2019-06-28 MED ORDER — PROCHLORPERAZINE MALEATE 10 MG PO TABS: 10.0000 mg | ORAL_TABLET | Freq: Four times a day (QID) | ORAL | 1 refills | Status: DC | PRN

## 2019-06-28 MED ORDER — LIDOCAINE-PRILOCAINE 2.5-2.5 % EX CREA: TOPICAL_CREAM | CUTANEOUS | 3 refills | Status: DC

## 2019-06-28 NOTE — Assessment & Plan Note (Signed)
I explained to the patient why she is not a surgical candidate The risk of losing her leg is an imaginable and would be catastrophic I agree with assessment of GYN oncologist not to pursue primary resection, rather, she should undergo concurrent chemoradiation therapy with curative intent Ultimately, after extensive discussion, she is in agreement to proceed  We discussed the role of chemotherapy. The intent is of curative intent.  We discussed some of the risks, benefits, side-effects of cisplatin and its role as chemo sensitizing agent. The plan for weekly cisplatin for x5 doses along with radiation treatment.  Some of the short term side-effects included, though not limited to, including weight loss, life threatening infections, risk of allergic reactions, need for transfusions of blood products, nausea, vomiting, change in bowel habits, loss of hair, admission to hospital for various reasons, and risks of death.   Long term side-effects are also discussed including risks of infertility, permanent damage to nerve function, hearing loss, chronic fatigue, kidney damage with possibility needing hemodialysis, and rare secondary malignancy including bone marrow disorders.  The patient is aware that the response rates discussed earlier is not guaranteed.  After a long discussion, patient made an informed decision to proceed with the prescribed plan of care.   Patient education material was dispensed.  I recommend port placement, chemo education class and baseline blood work to be done when she get port placement Per patient request, she will only allow chemotherapy on Tuesdays because she wants to continue to work I will try to get blood work done on Fridays, see me on Mondays and chemotherapy on Tuesday I plan 5 weekly dose of treatment I advised her to quit smoking

## 2019-06-28 NOTE — Assessment & Plan Note (Signed)
She has received several doses of intravenous iron Her recent blood counts show adequate replacement We will monitor her blood counts carefully She can stop Megace as soon as we start treatment

## 2019-06-28 NOTE — Progress Notes (Signed)
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 The intent is of curative intent.  We discussed some of the risks, benefits, side-effects of cisplatin and its role as chemo sensitizing agent. The plan for weekly cisplatin for x5 doses along with radiation treatment.  Some of the short term side-effects included, though not limited to, including weight loss, life threatening infections, risk of allergic reactions, need for transfusions of blood products, nausea, vomiting, change in bowel habits, loss of hair, admission to hospital for various reasons, and risks of death.   Long term side-effects are also discussed including risks of infertility, permanent damage to nerve function, hearing loss, chronic fatigue, kidney damage with possibility needing hemodialysis, and rare secondary malignancy including bone marrow disorders.  The patient is aware that the response rates discussed earlier is not guaranteed.  After a long discussion, patient made an informed decision to proceed with the prescribed plan of care.   Patient education material was dispensed.  I recommend port placement, chemo education class and baseline blood work to be done when she get port placement Per patient request, she will only allow chemotherapy on Tuesdays because she wants to continue to work I will try to get blood work done on Fridays, see me on Mondays and chemotherapy on Tuesday I plan 5 weekly  dose of treatment I advised her to quit smoking    Iron deficiency anemia due to chronic blood loss She has received several doses of intravenous iron Her recent blood counts show adequate replacement We will monitor her blood counts carefully She can stop Megace as soon as we start treatment  Peripheral vascular disease of lower extremity ([place]) She will continue on anticoagulation therapy We will hold her anticoagulation treatment for 24 hours prior to port placement   Orders Placed This Encounter  Procedures  . IR IMAGING GUIDED PORT INSERTION    Standing Status:   Future    Standing Expiration Date:   08/27/2020    Order Specific Question:   Reason for Exam (SYMPTOM  OR DIAGNOSIS REQUIRED)    Answer:   need port for chemo to start 10/6    Order Specific Question:   Is the patient pregnant?    Answer:   No    Order Specific Question:   Preferred Imaging Location?    Answer:   [facility]  . Magn[place]    Standing Status:   Standing    Number of Occurrences:   28    Standing Expiration Date:   06/27/2020     CHIEF COMPLAINTS/PURPOSE OF CONSULTATION:  Recent diagnosis of cervical cancer, for concurrent chemoradiation therapy  HISTORY OF PRESENTING ILLNESS:  Emma Medina 48 y.o. female is here because of recent diagnosis of cervical cancer The patient presented with postmenopausal bleeding She had extensive history of peripheral vascular disease last year requiring thrombectomy  I have reviewed her chart and materials related to her cancer extensively and collaborated history with the patient. 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  She was placed on Xarelto  and aspirin for following this procedure.  She had already been experiencing some irregular menses, however after commencing anticoagulant therapy her menstrual cycle became even heavier and more irregular with daily bleeding   09/20/2018 Imaging   US pelvis Negative aside from subserosal appearing 2.5 centimeter fibroid at the right fundus.   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 No evidence of thoracic metastasis. 2. Small RIGHT upper lobe pulmonary nodule favored benign.   Abdomen / Pelvis Impression:   1. No direct extension beyond the myometrium of the uterus, lower uterine segment or cervix. 2. No clear evidence metastatic adenopathy in the pelvis. Several subcentimeter iliac lymph nodes are noted. 3. No evidence distant metastatic disease or adenopathy in the abdomen pelvis. 4. Uterus is poorly evaluated by CT. Probable leiomyoma in the anterior wall. 5. No skeletal metastasis.   06/08/2019 Imaging   MRI pelvis 4.4 cm barrel shaped soft tissue mass in the cervix, consistent with known cervical carcinoma. No evidence of parametrial involvement or other pelvic metastatic disease. FIGO stage IB3 by imaging.   Mild hydrometros.   Small uterine fibroids and adenomyosis.   Normal appearance of both ovaries.  No adnexal mass identified.     06/28/2019 Cancer Staging   Staging form: Cervix Uteri, AJCC 8th Edition - Clinical: FIGO Stage IB1 (cT1b1, cN0, cM0) - Signed by Heath Lark, MD on 06/28/2019   06/28/2019 PET scan   1. Intense hypermetabolic lesion within the lower uterine segment/cervix  consistent with known cervical carcinoma. No evidence of extension beyond the myometrium. 2. No hypermetabolic pelvic lymphadenopathy or periaortic retroperitoneal adenopathy. 3. No evidence of distant metastatic disease or skeletal disease.        Since she started on Megace, she had minimum post menopausal bleeding She received 2 doses of intravenous iron with improvement of her energy She would like to continue working if possible  MEDICAL HISTORY:  Past Medical History:  Diagnosis Date  . Cancer ([place])   . Hypertension   . Obstructive thrombus   . Sciatica     SURGICAL HISTORY: Past Surgical History:  Procedure Laterality Date  . ABDOMINAL AORTOGRAM N/A 05/18/2018   Procedure: ABDOMINAL AORTOGRAM;  Surgeon: Waynetta Sandy, MD;  Location: [place] [facility];  Service: Cardiovascular;  Laterality: N/A;  . ABDOMINAL AORTOGRAM W/LOWER EXTREMITY Left 10/26/2018   Procedure: ABDOMINAL AORTOGRAM W/LOWER EXTREMITY;  Surgeon: Waynetta Sandy, MD;  Location: [place] [facility];  Service: Cardiovascular;  Laterality: Left;  . LOWER EXTREMITY ANGIOGRAPHY Bilateral 05/18/2018   Procedure: LOWER EXTREMITY ANGIOGRAPHY;  Surgeon: Waynetta Sandy, MD;  Location: [place] [facility];  Service: Cardiovascular;  Laterality: Bilateral;  . THROMBECTOMY FEMORAL ARTERY Left 05/18/2018   Procedure: LEFT LEG THROMBECTOMY, BALLOON ANGIOPLASTY LEFT POSTERIOR TIBIAL ARTERY;  Surgeon: Servando Snare  Harrell Gave, MD;  Location: [place];  Service: Vascular;  Laterality: Left;  . TUBAL LIGATION      SOCIAL HISTORY: Social History   Socioeconomic History  . Marital status: Single    Spouse name: Not on file  . Number of children: Not on file  . Years of education: Not on file  . Highest education level: Not on file  Occupational History  . Not on file  Social Needs  . Financial resource strain: Not on file  . Food insecurity    Worry: Not on file    Inability: Not on file  .  Transportation needs    Medical: Not on file    Non-medical: Not on file  Tobacco Use  . Smoking status: Former Smoker    Packs/day: 1.00    Quit date: 07/02/2016    Years since quitting: 2.9  . Smokeless tobacco: Never Used  Substance and Sexual Activity  . Alcohol use: No  . Drug use: Yes    Frequency: 7.0 times per week    Types: Marijuana    Comment: smokes blunt on occasion  . Sexual activity: Not on file  Lifestyle  . Physical activity    Days per week: Not on file    Minutes per session: Not on file  . Stress: Not on file  Relationships  . Social Herbalist on phone: Not on file    Gets together: Not on file    Attends religious service: Not on file    Active member of club or organization: Not on file    Attends meetings of clubs or organizations: Not on file    Relationship status: Not on file  . Intimate partner violence    Fear of current or ex partner: Not on file    Emotionally abused: Not on file    Physically abused: Not on file    Forced sexual activity: Not on file  Other Topics Concern  . Not on file  Social History Narrative  . Not on file    FAMILY HISTORY: Family History  Problem Relation Age of Onset  . Cancer Mother        brain and uterine  . Stroke Brother   . Diabetes Brother   . Breast cancer Neg Hx     ALLERGIES:  has No Known Allergies.  MEDICATIONS:  Current Outpatient Medications  Medication Sig Dispense Refill  . aspirin EC 81 MG EC tablet Take 1 tablet (81 mg total) by mouth daily.    . cyclobenzaprine (FLEXERIL) 10 MG tablet Take 10 mg by mouth daily.     . hydrocortisone 2.5 % lotion Apply topically 2 (two) times daily. (Patient taking differently: Apply 1 application topically 2 (two) times daily. Applied to rash on legs) 118 mL 0  . lidocaine-prilocaine (EMLA) cream Apply to affected area once 30 g 3  . megestrol (MEGACE) 40 MG tablet Take 1 tablet (40 mg total) by mouth 2 (two) times daily. 60 tablet 5  .  ondansetron (ZOFRAN) 8 MG tablet Take 1 tablet (8 mg total) by mouth every 8 (eight) hours as needed. Start on the third day after chemotherapy. 30 tablet 1  . prochlorperazine (COMPAZINE) 10 MG tablet Take 1 tablet (10 mg total) by mouth every 6 (six) hours as needed (Nausea or vomiting). 30 tablet 1  . XARELTO 20 MG TABS tablet Take 1 tablet by mouth once daily 30 tablet 6   No current facility-administered medications for  this visit.     REVIEW OF SYSTEMS:   Constitutional: Denies fevers, chills or abnormal night sweats Eyes: Denies blurriness of vision, double vision or watery eyes Ears, nose, mouth, throat, and face: Denies mucositis or sore throat Respiratory: Denies cough, dyspnea or wheezes Cardiovascular: Denies palpitation, chest discomfort or lower extremity swelling Gastrointestinal:  Denies nausea, heartburn or change in bowel habits Skin: Denies abnormal skin rashes Lymphatics: Denies new lymphadenopathy or easy bruising Neurological:Denies numbness, tingling or new weaknesses Behavioral/Psych: Mood is stable, no new changes  All other systems were reviewed with the patient and are negative.  PHYSICAL EXAMINATION: ECOG PERFORMANCE STATUS: 1 - Symptomatic but completely ambulatory  Vitals:   06/28/19 1435  BP: 125/83  Pulse: 67  Resp: 18  Temp: 98.5 F (36.9 C)  SpO2: 100%   Filed Weights   06/28/19 1435  Weight: 229 lb 12.8 oz (104.2 kg)    GENERAL:alert, no distress and comfortable SKIN: skin color, texture, turgor are normal, no rashes or significant lesions EYES: normal, conjunctiva are pink and non-injected, sclera clear OROPHARYNX:no exudate, no erythema and lips, buccal mucosa, and tongue normal  NECK: supple, thyroid normal size, non-tender, without nodularity LYMPH:  no palpable lymphadenopathy in the cervical, axillary or inguinal LUNGS: clear to auscultation and percussion with normal breathing effort HEART: regular rate & rhythm and no murmurs and  no lower extremity edema ABDOMEN:abdomen soft, non-tender and normal bowel sounds Musculoskeletal:no cyanosis of digits and no clubbing  PSYCH: alert & oriented x 3 with fluent speech NEURO: no focal motor/sensory deficits  LABORATORY DATA:  I have reviewed the data as listed Lab Results  Component Value Date   WBC 6.9 06/01/2019   HGB 11.6 (L) 06/01/2019   HCT 37.2 06/01/2019   MCV 75.2 (L) 06/01/2019   PLT 329 06/01/2019   Recent Labs    09/20/18 1326 10/26/18 0608 10/26/18 0612 06/01/19 1133  NA 139 142  --  142  K 2.8* 3.6  --  4.5  CL 101  --   --  110  CO2 25  --   --  23  GLUCOSE 92 112*  --  107*  BUN <5*  --   --  9  CREATININE 0.84  --  0.80 0.90  CALCIUM 8.8*  --   --  9.3  GFRNONAA >60  --   --  >60  GFRAA >60  --   --  >60  PROT 7.6  --   --   --   ALBUMIN 3.5  --   --   --   AST 19  --   --   --   ALT 12  --   --   --   ALKPHOS 66  --   --   --   BILITOT 0.4  --   --   --     RADIOGRAPHIC STUDIES: I have reviewed multiple imaging studies with the patient I have personally reviewed the radiological images as listed and agreed with the findings in the report. Ct Chest W Contrast  Result Date: 06/05/2019 CLINICAL DATA:  Endometrial carcinoma. Gross cervical involvement suspected. New diagnosis. EXAM: CT CHEST, ABDOMEN, AND PELVIS WITH CONTRAST TECHNIQUE: Multidetector CT imaging of the chest, abdomen and pelvis was performed following the standard protocol during bolus administration of intravenous contrast. CONTRAST:  158mL OMNIPAQUE IOHEXOL 300 MG/ML  SOLN COMPARISON:  None. FINDINGS: CT CHEST FINDINGS Cardiovascular: No significant vascular findings. Normal heart size. No pericardial effusion. Mediastinum/Nodes: No axillary  or supraclavicular adenopathy. No mediastinal or hilar adenopathy. No pericardial effusion esophagus normal Lungs/Pleura: Small subpleural nodule in the RIGHT upper lobe measures 3 mm (image 47/6). No suspicious pulmonary nodules otherwise.  Musculoskeletal: No aggressive osseous lesion. CT ABDOMEN AND PELVIS FINDINGS Hepatobiliary: No focal hepatic lesion. Multiple stones packed the lumen of the gallbladder. No inflammation. Common bile duct normal. Pancreas: Pancreas is normal. No ductal dilatation. No pancreatic inflammation. Spleen: Normal spleen Adrenals/urinary tract: Adrenal glands and kidneys are normal. The ureters and bladder normal. Stomach/Bowel: Stomach, small bowel, appendix, and cecum are normal. The colon and rectosigmoid colon are normal. Vascular/Lymphatic: Abdominal aorta normal caliber. No periaortic retroperitoneal adenopathy. No periportal adenopathy. In the pelvis, no iliac lymphadenopathy. several lymph nodes which are less than 10 mm short axis. Example RIGHT operator node measures 5 mm (image 106/2). Similar 4 mm node on the LEFT on image 26/2. Reproductive: There is low attenuation within the endometrial canal of the uterine fundus. There is no evidence of gross extension beyond the myometrium of the lower uterine segment or cervical uterus. There is a rounded lesion along the anterior wall the fundus measuring 2 cm which corresponds to leiomyoma on comparison ultrasound. The endometrium is poorly imaged by CT. Other: No free fluid Musculoskeletal: No aggressive osseous lesion. IMPRESSION: Chest Impression: 1. No evidence of thoracic metastasis. 2. Small RIGHT upper lobe pulmonary nodule favored benign. Abdomen / Pelvis Impression: 1. No direct extension beyond the myometrium of the uterus, lower uterine segment or cervix. 2. No clear evidence metastatic adenopathy in the pelvis. Several subcentimeter iliac lymph nodes are noted. 3. No evidence distant metastatic disease or adenopathy in the abdomen pelvis. 4. Uterus is poorly evaluated by CT. Probable leiomyoma in the anterior wall. 5. No skeletal metastasis. Electronically Signed   By: Suzy Bouchard M.D.   On: 06/05/2019 16:58   Mr Pelvis W [id number] Contrast  Result Date:  06/11/2019 CLINICAL DATA:  Newly diagnosed cervical squamous cell carcinoma. EXAM: MRI PELVIS WITHOUT AND WITH CONTRAST TECHNIQUE: Multiplanar multisequence MR imaging of the pelvis was performed both before and after administration of intravenous contrast. CONTRAST:  10 mL Gadavist COMPARISON:  None. FINDINGS: Lower Urinary Tract: No urinary bladder or urethral abnormality identified. Bowel: Unremarkable appearance of rectum and other pelvic bowel loops. Vascular/Lymphatic: Unremarkable. No pathologically enlarged pelvic lymph nodes identified. Reproductive: -- Uterus: Measures 12.7 x 6.8 x 6.8 cm (volume = 310 cm^3). An intramural fibroid is seen in the left anterior corpus which measures 2.8 x 2.6 cm. Another tiny subserosal fibroid is seen in the fundus measuring approximately 1.7 cm. In addition, there is ill-defined thickening of the myometrial junctional zone in the uterine fundus with a few tiny cystic foci, consistent with adenomyosis. Mild hematometros is seen. A barrel-shaped hypoenhancing soft tissue mass is seen in the cervix which measures 4.5 by 2.8 x 2.4 cm, consistent with cervical carcinoma. This appears to show deep invasion of the cervical fibrous stroma greater than 5 mm. No evidence of extra-uterine extension into the parametrial soft tissues or vagina. -- Right ovary: Appears normal. No mass or inflammatory process identified. -- Left ovary: Appears normal. No mass or inflammatory process identified. Other: No peritoneal thickening or abnormal free fluid. Musculoskeletal:  Unremarkable. IMPRESSION: 4.4 cm barrel shaped soft tissue mass in the cervix, consistent with known cervical carcinoma. No evidence of parametrial involvement or other pelvic metastatic disease. FIGO stage IB3 by imaging. Mild hydrometros. Small uterine fibroids and adenomyosis. Normal appearance of both ovaries.  No adnexal  mass identified. Electronically Signed   By: Marlaine Hind M.D.   On: 06/11/2019 09:16   Ct Abdomen  Pelvis W Contrast  Result Date: 06/05/2019 CLINICAL DATA:  Endometrial carcinoma. Gross cervical involvement suspected. New diagnosis. EXAM: CT CHEST, ABDOMEN, AND PELVIS WITH CONTRAST TECHNIQUE: Multidetector CT imaging of the chest, abdomen and pelvis was performed following the standard protocol during bolus administration of intravenous contrast. CONTRAST:  165mL OMNIPAQUE IOHEXOL 300 MG/ML  SOLN COMPARISON:  None. FINDINGS: CT CHEST FINDINGS Cardiovascular: No significant vascular findings. Normal heart size. No pericardial effusion. Mediastinum/Nodes: No axillary or supraclavicular adenopathy. No mediastinal or hilar adenopathy. No pericardial effusion esophagus normal Lungs/Pleura: Small subpleural nodule in the RIGHT upper lobe measures 3 mm (image 47/6). No suspicious pulmonary nodules otherwise. Musculoskeletal: No aggressive osseous lesion. CT ABDOMEN AND PELVIS FINDINGS Hepatobiliary: No focal hepatic lesion. Multiple stones packed the lumen of the gallbladder. No inflammation. Common bile duct normal. Pancreas: Pancreas is normal. No ductal dilatation. No pancreatic inflammation. Spleen: Normal spleen Adrenals/urinary tract: Adrenal glands and kidneys are normal. The ureters and bladder normal. Stomach/Bowel: Stomach, small bowel, appendix, and cecum are normal. The colon and rectosigmoid colon are normal. Vascular/Lymphatic: Abdominal aorta normal caliber. No periaortic retroperitoneal adenopathy. No periportal adenopathy. In the pelvis, no iliac lymphadenopathy. several lymph nodes which are less than 10 mm short axis. Example RIGHT operator node measures 5 mm (image 106/2). Similar 4 mm node on the LEFT on image 26/2. Reproductive: There is low attenuation within the endometrial canal of the uterine fundus. There is no evidence of gross extension beyond the myometrium of the lower uterine segment or cervical uterus. There is a rounded lesion along the anterior wall the fundus measuring 2 cm which  corresponds to leiomyoma on comparison ultrasound. The endometrium is poorly imaged by CT. Other: No free fluid Musculoskeletal: No aggressive osseous lesion. IMPRESSION: Chest Impression: 1. No evidence of thoracic metastasis. 2. Small RIGHT upper lobe pulmonary nodule favored benign. Abdomen / Pelvis Impression: 1. No direct extension beyond the myometrium of the uterus, lower uterine segment or cervix. 2. No clear evidence metastatic adenopathy in the pelvis. Several subcentimeter iliac lymph nodes are noted. 3. No evidence distant metastatic disease or adenopathy in the abdomen pelvis. 4. Uterus is poorly evaluated by CT. Probable leiomyoma in the anterior wall. 5. No skeletal metastasis. Electronically Signed   By: Suzy Bouchard M.D.   On: 06/05/2019 16:58   Nm Pet Image Initial (pi) Skull Base To Thigh  Result Date: 06/28/2019 CLINICAL DATA:  Initial treatment strategy for squamous cell carcinoma of the cervix. Staging 1b 3 by imaging. Initial staging with plans for chemo radiation therapy. EXAM: NUCLEAR MEDICINE PET SKULL BASE TO THIGH TECHNIQUE: 13.0 mCi F-18 FDG was injected intravenously. Full-ring PET imaging was performed from the skull base to thigh after the radiotracer. CT data was obtained and used for attenuation correction and anatomic localization. Fasting blood glucose: 142 mg/dl COMPARISON:  MRI pelvis June 08, 2019, CT June 05, 2019 FINDINGS: Mediastinal blood pool activity: SUV max 2.5 Liver activity: SUV max NA NECK: No hypermetabolic lymph nodes in the neck. Incidental CT findings: none CHEST: No hypermetabolic mediastinal or hilar nodes. No suspicious pulmonary nodules on the CT scan. Incidental CT findings: none ABDOMEN/PELVIS: Within the cervix/lower uterine segment, there is a 5 cm circumferential hypermetabolic lesion with SUV max equal 13.0. No evidence of extension beyond the myometrium. No evidence of soft tissue extension the pelvis. No hypermetabolic lymph nodes in  the pelvis.  No hypermetabolic periaortic lymph nodes. No evidence liver metastasis or soft tissue metastasis otherwise in the abdomen pelvis. Incidental CT findings: The gallbladder is packed with gallstones SKELETON: No focal hypermetabolic activity to suggest skeletal metastasis. Incidental CT findings: none IMPRESSION: 1. Intense hypermetabolic lesion within the lower uterine segment/cervix consistent with known cervical carcinoma. No evidence of extension beyond the myometrium. 2. No hypermetabolic pelvic lymphadenopathy or periaortic retroperitoneal adenopathy. 3. No evidence of distant metastatic disease or skeletal disease. Electronically Signed   By: Suzy Bouchard M.D.   On: 06/28/2019 09:00    I spent 55 minutes counseling the patient face to face. The total time spent in the appointment was 80 minutes and more than 50% was on counseling.  All questions were answered. The patient knows to call the clinic with any problems, questions or concerns.  Heath Lark, MD 06/28/2019 4:19 PM

## 2019-06-28 NOTE — Progress Notes (Addendum)
START OFF PATHWAY REGIMEN - Other   [id number]:Cisplatin 40 mg/m2 IV D1 q7 Days + RT:   A cycle is every 7 days:     Cisplatin   **Always confirm dose/schedule in your pharmacy ordering system**  Patient Characteristics: Intent of Therapy: Curative Intent, Discussed with Patient 

## 2019-06-28 NOTE — Assessment & Plan Note (Signed)
She will continue on anticoagulation therapy We will hold her anticoagulation treatment for 24 hours prior to port placement

## 2019-06-28 NOTE — Telephone Encounter (Signed)
Called Emma Medina and scheduled patient education class for 07/03/19 at 12 pm.    Also informed her of port insertion on 07/05/19 with arrival at 12 pm, npo after 7 am, hold Xarelto for 24 hours before procedure (last dose on 07/03/19) and that she will need a driver.  She verbalized understanding and agreement.

## 2019-06-29 ENCOUNTER — Telehealth: Payer: Self-pay | Admitting: Hematology and Oncology

## 2019-06-29 NOTE — Telephone Encounter (Signed)
I left a message regarding schedule  

## 2019-07-02 ENCOUNTER — Other Ambulatory Visit: Payer: Self-pay

## 2019-07-02 ENCOUNTER — Other Ambulatory Visit: Payer: Self-pay | Admitting: Oncology

## 2019-07-02 ENCOUNTER — Ambulatory Visit
Admission: RE | Admit: 2019-07-02 | Discharge: 2019-07-02 | Disposition: A | Discharge status: Home / Self Care | Payer: BLUE CROSS/BLUE SHIELD | Source: Ambulatory Visit | Attending: Radiation Oncology | Admitting: Radiation Oncology

## 2019-07-02 DIAGNOSIS — C538 Malignant neoplasm of overlapping sites of cervix uteri: Secondary | ICD-10-CM

## 2019-07-02 NOTE — Progress Notes (Signed)
  Radiation Oncology         [phone] ________________________________  Name: Emma Medina MRN: [id number]  Date: 07/02/2019  DOB: June 09, 1971  SIMULATION AND TREATMENT PLANNING NOTE    ICD-10-CM   1. 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  Identity was confirmed.  All relevant records and images related to the planned course of therapy were reviewed.  The patient freely provided informed written consent to proceed with treatment after reviewing the details related to the planned course of therapy. The consent form was witnessed and verified by the simulation staff.  Then, the patient was set-up in a stable reproducible  supine position for radiation therapy.  CT images were obtained.  Surface markings were placed.  The CT images were loaded into the planning software.  Then the target and avoidance structures were contoured.  Treatment planning then occurred.  The radiation prescription was entered and confirmed.  Then, I designed and supervised the construction of a total of 5 medically necessary complex treatment devices.  I have requested : 3D Simulation  I have requested a DVH of the following structures: Cervix, bladder, rectum, bowel.  I have ordered:dose calc.  PLAN:  The patient will receive 45 Gy in 25 fractions directed at the pelvis region along with radiosensitizing chemotherapy.  The patient will then proceed with 5 intracavitary high dose rate  treatments directed at cervical area using iridium 192 as the high-dose-rate source.   Special Treatment Procedure Note: The patient will be receiving radiosensitizing chemotherapy. Given the potential of increased toxicities related to combined therapy and the necessity for close monitoring of the patient and blood work, this constitutes a special treatment procedure.   Patient also be receiving brachii therapy treatments which also constitutes a special treatment procedure  -----------------------------------  Blair Promise, PhD, MD  This document serves as a record of services personally performed by Gery Pray, MD. It was created on his behalf by Wilburn Mylar, a trained medical scribe. The creation of this record is based on the scribe's personal observations and the provider's statements to them. This document has been checked and approved by the attending provider.

## 2019-07-02 NOTE — Progress Notes (Signed)
Gynecologic Oncology Multi-Disciplinary Disposition Conference Note  Date of the Conference: 07/02/2019  Patient Name: Emma Medina  Referring Provider: Dr. Hulan Fray Primary GYN Oncologist: Dr. Denman George  Stage/Disposition:  [place] [facility] IB3 cervical cancer. Disposition is to primary chemoradiation. PD L1 testing requested.   This Multidisciplinary conference took place involving physicians from [place], [place], Radiation Oncology, Pathology, Radiology along with the Gynecologic Oncology Nurse Practitioner and RN.  Comprehensive assessment of the patient's malignancy, staging, need for surgery, chemotherapy, radiation therapy, and need for further testing were reviewed. Supportive measures, both inpatient and following discharge were also discussed. The recommended plan of care is documented. Greater than 35 minutes were spent correlating and coordinating this patient's care.

## 2019-07-03 ENCOUNTER — Telehealth: Payer: Self-pay | Admitting: Oncology

## 2019-07-03 ENCOUNTER — Other Ambulatory Visit: Payer: Self-pay

## 2019-07-03 ENCOUNTER — Other Ambulatory Visit: Payer: Self-pay | Admitting: Radiology

## 2019-07-03 ENCOUNTER — Inpatient Hospital Stay: Payer: BLUE CROSS/BLUE SHIELD

## 2019-07-03 NOTE — Telephone Encounter (Signed)
Called Emma Medina and reviewed instructions to take her last dose of Xarelto today and then hold it until after she has her port placed on 07/05/19.

## 2019-07-05 ENCOUNTER — Encounter (HOSPITAL_COMMUNITY): Payer: Self-pay | Admitting: Radiology

## 2019-07-05 ENCOUNTER — Ambulatory Visit (HOSPITAL_COMMUNITY)
Admission: RE | Admit: 2019-07-05 | Discharge: 2019-07-05 | Disposition: A | Discharge status: Home / Self Care | Payer: BLUE CROSS/BLUE SHIELD | Source: Ambulatory Visit | Attending: Hematology and Oncology | Admitting: Hematology and Oncology

## 2019-07-05 ENCOUNTER — Telehealth: Payer: Self-pay | Admitting: *Deleted

## 2019-07-05 ENCOUNTER — Ambulatory Visit (HOSPITAL_COMMUNITY)
Admission: RE | Admit: 2019-07-05 | Discharge: 2019-07-05 | Disposition: A | Discharge status: Home / Self Care | Payer: BLUE CROSS/BLUE SHIELD | Source: Ambulatory Visit

## 2019-07-05 ENCOUNTER — Other Ambulatory Visit: Payer: Self-pay

## 2019-07-05 DIAGNOSIS — E669 Obesity, unspecified: Secondary | ICD-10-CM | POA: Insufficient documentation

## 2019-07-05 DIAGNOSIS — C538 Malignant neoplasm of overlapping sites of cervix uteri: Secondary | ICD-10-CM | POA: Diagnosis not present

## 2019-07-05 DIAGNOSIS — Z7901 Long term (current) use of anticoagulants: Secondary | ICD-10-CM | POA: Insufficient documentation

## 2019-07-05 DIAGNOSIS — Z87891 Personal history of nicotine dependence: Secondary | ICD-10-CM | POA: Diagnosis not present

## 2019-07-05 DIAGNOSIS — Z7982 Long term (current) use of aspirin: Secondary | ICD-10-CM | POA: Insufficient documentation

## 2019-07-05 DIAGNOSIS — I10 Essential (primary) hypertension: Secondary | ICD-10-CM | POA: Insufficient documentation

## 2019-07-05 DIAGNOSIS — Z6837 Body mass index (BMI) 37.0-37.9, adult: Secondary | ICD-10-CM | POA: Insufficient documentation

## 2019-07-05 DIAGNOSIS — Z79899 Other long term (current) drug therapy: Secondary | ICD-10-CM | POA: Insufficient documentation

## 2019-07-05 HISTORY — PX: IR IMAGING GUIDED PORT INSERTION: IMG5740

## 2019-07-05 LAB — CBC WITH DIFFERENTIAL/PLATELET
Abs Immature Granulocytes: 0.01 10*3/uL (ref 0.00–0.07)
Basophils Absolute: 0.1 10*3/uL (ref 0.0–0.1)
Basophils Relative: 1 %
Eosinophils Absolute: 0.2 10*3/uL (ref 0.0–0.5)
Eosinophils Relative: 3 %
HCT: 42.6 % (ref 36.0–46.0)
Hemoglobin: 13.5 g/dL (ref 12.0–15.0)
Immature Granulocytes: 0 %
Lymphocytes Relative: 49 %
Lymphs Abs: 3 10*3/uL (ref 0.7–4.0)
MCH: 25 pg — ABNORMAL LOW (ref 26.0–34.0)
MCHC: 31.7 g/dL (ref 30.0–36.0)
MCV: 79 fL — ABNORMAL LOW (ref 80.0–100.0)
Monocytes Absolute: 0.4 10*3/uL (ref 0.1–1.0)
Monocytes Relative: 7 %
Neutro Abs: 2.5 10*3/uL (ref 1.7–7.7)
Neutrophils Relative %: 40 %
Platelets: 367 10*3/uL (ref 150–400)
RBC: 5.39 MIL/uL — ABNORMAL HIGH (ref 3.87–5.11)
WBC: 6.2 10*3/uL (ref 4.0–10.5)
nRBC: 0 % (ref 0.0–0.2)

## 2019-07-05 LAB — PROTIME-INR
INR: 1 (ref 0.8–1.2)
Prothrombin Time: 12.6 seconds (ref 11.4–15.2)

## 2019-07-05 MED ORDER — HEPARIN SOD (PORK) LOCK FLUSH 100 UNIT/ML IV SOLN
INTRAVENOUS | Status: AC | PRN
  Administered 2019-07-05: 500 [IU] via INTRAVENOUS

## 2019-07-05 MED ORDER — MIDAZOLAM HCL 2 MG/2ML IJ SOLN
INTRAMUSCULAR | Status: AC
  Filled 2019-07-05: qty 4

## 2019-07-05 MED ORDER — CEFAZOLIN SODIUM-DEXTROSE 2-4 GM/100ML-% IV SOLN
2.0000 g | INTRAVENOUS | Status: AC
  Administered 2019-07-05: 14:00:00 2 g via INTRAVENOUS

## 2019-07-05 MED ORDER — SODIUM CHLORIDE 0.9 % IV SOLN
INTRAVENOUS | Status: DC
  Administered 2019-07-05: 13:00:00 via INTRAVENOUS

## 2019-07-05 MED ORDER — LIDOCAINE-EPINEPHRINE 1 %-1:100000 IJ SOLN
INTRAMUSCULAR | Status: AC
  Filled 2019-07-05: qty 1

## 2019-07-05 MED ORDER — FENTANYL CITRATE (PF) 100 MCG/2ML IJ SOLN
INTRAMUSCULAR | Status: AC | PRN
  Administered 2019-07-05: 50 ug via INTRAVENOUS

## 2019-07-05 MED ORDER — FENTANYL CITRATE (PF) 100 MCG/2ML IJ SOLN
INTRAMUSCULAR | Status: AC
  Filled 2019-07-05: qty 2

## 2019-07-05 MED ORDER — HEPARIN SOD (PORK) LOCK FLUSH 100 UNIT/ML IV SOLN
INTRAVENOUS | Status: AC
  Filled 2019-07-05: qty 5

## 2019-07-05 MED ORDER — MIDAZOLAM HCL 2 MG/2ML IJ SOLN
INTRAMUSCULAR | Status: AC | PRN
  Administered 2019-07-05 (×2): 1 mg via INTRAVENOUS

## 2019-07-05 MED ORDER — LIDOCAINE HCL (PF) 1 % IJ SOLN
INTRAMUSCULAR | Status: AC | PRN
  Administered 2019-07-05: 5 mL

## 2019-07-05 MED ORDER — LIDOCAINE-EPINEPHRINE (PF) 1 %-1:200000 IJ SOLN
INTRAMUSCULAR | Status: AC | PRN
  Administered 2019-07-05: 10 mL

## 2019-07-05 MED ORDER — LIDOCAINE HCL 1 % IJ SOLN
INTRAMUSCULAR | Status: AC
  Filled 2019-07-05: qty 20

## 2019-07-05 MED ORDER — CEFAZOLIN SODIUM-DEXTROSE 2-4 GM/100ML-% IV SOLN
INTRAVENOUS | Status: AC
  Administered 2019-07-05: 14:00:00 2 g via INTRAVENOUS
  Filled 2019-07-05: qty 100

## 2019-07-05 NOTE — Discharge Instructions (Signed)
Implanted Port Insertion, Care After °This sheet gives you information about how to care for yourself after your procedure. Your health care provider may also give you more specific instructions. If you have problems or questions, contact your health care provider. °What can I expect after the procedure? °After the procedure, it is common to have: °· Discomfort at the port insertion site. °· Bruising on the skin over the port. This should improve over 3-4 days. °Follow these instructions at home: °Port care °· After your port is placed, you will get a manufacturer's information card. The card has information about your port. Keep this card with you at all times. °· Take care of the port as told by your health care provider. Ask your health care provider if you or a family member can get training for taking care of the port at home. A home health care nurse may also take care of the port. °· Make sure to remember what type of port you have. °Incision care ° °  ° °· Follow instructions from your health care provider about how to take care of your port insertion site. Make sure you: °? Wash your hands with soap and water before and after you change your bandage (dressing). If soap and water are not available, use hand sanitizer. °? Change your dressing as told by your health care provider. °? Leave stitches (sutures), skin glue, or adhesive strips in place. These skin closures may need to stay in place for 2 weeks or longer. If adhesive strip edges start to loosen and curl up, you may trim the loose edges. Do not remove adhesive strips completely unless your health care provider tells you to do that. °· Check your port insertion site every day for signs of infection. Check for: °? Redness, swelling, or pain. °? Fluid or blood. °? Warmth. °? Pus or a bad smell. °Activity °· Return to your normal activities as told by your health care provider. Ask your health care provider what activities are safe for you. °· Do not  lift anything that is heavier than 10 lb (4.5 kg), or the limit that you are told, until your health care provider says that it is safe. °General instructions °· Take over-the-counter and prescription medicines only as told by your health care provider. °· Do not take baths, swim, or use a hot tub until your health care provider approves. Ask your health care provider if you may take showers. You may only be allowed to take sponge baths. °· Do not drive for 24 hours if you were given a sedative during your procedure. °· Wear a medical alert bracelet in case of an emergency. This will tell any health care providers that you have a port. °· Keep all follow-up visits as told by your health care provider. This is important. °Contact a health care provider if: °· You cannot flush your port with saline as directed, or you cannot draw blood from the port. °· You have a fever or chills. °· You have redness, swelling, or pain around your port insertion site. °· You have fluid or blood coming from your port insertion site. °· Your port insertion site feels warm to the touch. °· You have pus or a bad smell coming from the port insertion site. °Get help right away if: °· You have chest pain or shortness of breath. °· You have bleeding from your port that you cannot control. °Summary °· Take care of the port as told by your health   care provider. Keep the manufacturer's information card with you at all times. °· Change your dressing as told by your health care provider. °· Contact a health care provider if you have a fever or chills or if you have redness, swelling, or pain around your port insertion site. °· Keep all follow-up visits as told by your health care provider. °This information is not intended to replace advice given to you by your health care provider. Make sure you discuss any questions you have with your health care provider. °Document Released: 07/11/2013 Document Revised: 04/18/2018 Document Reviewed:  04/18/2018 °Elsevier Patient Education © 2020 [employer] ° ° ° °Implanted Port Home Guide °An implanted port is a device that is placed under the skin. It is usually placed in the chest. The device can be used to give IV medicine, to take blood, or for dialysis. You may have an implanted port if: °· You need IV medicine that would be irritating to the small veins in your hands or arms. °· You need IV medicines, such as antibiotics, for a long period of time. °· You need IV nutrition for a long period of time. °· You need dialysis. °Having a port means that your health care provider will not need to use the veins in your arms for these procedures. You may have fewer limitations when using a port than you would if you used other types of long-term IVs, and you will likely be able to return to normal activities after your incision heals. °An implanted port has two main parts: °· Reservoir. The reservoir is the part where a needle is inserted to give medicines or draw blood. The reservoir is round. After it is placed, it appears as a small, raised area under your skin. °· Catheter. The catheter is a thin, flexible tube that connects the reservoir to a vein. Medicine that is inserted into the reservoir goes into the catheter and then into the vein. °How is my port accessed? °To access your port: °· A numbing cream may be placed on the skin over the port site. °· Your health care provider will put on a mask and sterile gloves. °· The skin over your port will be cleaned carefully with a germ-killing soap and allowed to dry. °· Your health care provider will gently pinch the port and insert a needle into it. °· Your health care provider will check for a blood return to make sure the port is in the vein and is not clogged. °· If your port needs to remain accessed to get medicine continuously (constant infusion), your health care provider will place a clear bandage (dressing) over the needle site. The dressing and needle  will need to be changed every week, or as told by your health care provider. °What is flushing? °Flushing helps keep the port from getting clogged. Follow instructions from your health care provider about how and when to flush the port. Ports are usually flushed with saline solution or a medicine called heparin. The need for flushing will depend on how the port is used: °· If the port is only used from time to time to give medicines or draw blood, the port may need to be flushed: °? Before and after medicines have been given. °? Before and after blood has been drawn. °? As part of routine maintenance. Flushing may be recommended every 4-6 weeks. °· If a constant infusion is running, the port may not need to be flushed. °· Throw away any syringes in   a disposal container that is meant for sharp items (sharps container). You can buy a sharps container from a pharmacy, or you can make one by using an empty hard plastic bottle with a cover. °How long will my port stay implanted? °The port can stay in for as long as your health care provider thinks it is needed. When it is time for the port to come out, a surgery will be done to remove it. The surgery will be similar to the procedure that was done to put the port in. °Follow these instructions at home: ° °· Flush your port as told by your health care provider. °· If you need an infusion over several days, follow instructions from your health care provider about how to take care of your port site. Make sure you: °? Wash your hands with soap and water before you change your dressing. If soap and water are not available, use alcohol-based hand sanitizer. °? Change your dressing as told by your health care provider. °? Place any used dressings or infusion bags into a plastic bag. Throw that bag in the trash. °? Keep the dressing that covers the needle clean and dry. Do not get it wet. °? Do not use scissors or sharp objects near the tube. °? Keep the tube clamped, unless it  is being used. °· Check your port site every day for signs of infection. Check for: °? Redness, swelling, or pain. °? Fluid or blood. °? Pus or a bad smell. °· Protect the skin around the port site. °? Avoid wearing bra straps that rub or irritate the site. °? Protect the skin around your port from seat belts. Place a soft pad over your chest if needed. °· Bathe or shower as told by your health care provider. The site may get wet as long as you are not actively receiving an infusion. °· Return to your normal activities as told by your health care provider. Ask your health care provider what activities are safe for you. °· Carry a medical alert card or wear a medical alert bracelet at all times. This will let health care providers know that you have an implanted port in case of an emergency. °Get help right away if: °· You have redness, swelling, or pain at the port site. °· You have fluid or blood coming from your port site. °· You have pus or a bad smell coming from the port site. °· You have a fever. °Summary °· Implanted ports are usually placed in the chest for long-term IV access. °· Follow instructions from your health care provider about flushing the port and changing bandages (dressings). °· Take care of the area around your port by avoiding clothing that puts pressure on the area, and by watching for signs of infection. °· Protect the skin around your port from seat belts. Place a soft pad over your chest if needed. °· Get help right away if you have a fever or you have redness, swelling, pain, drainage, or a bad smell at the port site. °This information is not intended to replace advice given to you by your health care provider. Make sure you discuss any questions you have with your health care provider. °Document Released: 09/20/2005 Document Revised: 01/12/2019 Document Reviewed: 10/23/2016 °Elsevier Patient Education © 2020 [employer] ° ° °Moderate Conscious Sedation, Adult, Care After °These  instructions provide you with information about caring for yourself after your procedure. Your health care provider may also give you more specific instructions.   Your treatment has been planned according to current medical practices, but problems sometimes occur. Call your health care provider if you have any problems or questions after your procedure. °What can I expect after the procedure? °After your procedure, it is common: °· To feel sleepy for several hours. °· To feel clumsy and have poor balance for several hours. °· To have poor judgment for several hours. °· To vomit if you eat too soon. °Follow these instructions at home: °For at least 24 hours after the procedure: ° °· Do not: °? Participate in activities where you could fall or become injured. °? Drive. °? Use heavy machinery. °? Drink alcohol. °? Take sleeping pills or medicines that cause drowsiness. °? Make important decisions or sign legal documents. °? Take care of children on your own. °· Rest. °Eating and drinking °· Follow the diet recommended by your health care provider. °· If you vomit: °? Drink water, juice, or soup when you can drink without vomiting. °? Make sure you have little or no nausea before eating solid foods. °General instructions °· Have a responsible adult stay with you until you are awake and alert. °· Take over-the-counter and prescription medicines only as told by your health care provider. °· If you smoke, do not smoke without supervision. °· Keep all follow-up visits as told by your health care provider. This is important. °Contact a health care provider if: °· You keep feeling nauseous or you keep vomiting. °· You feel light-headed. °· You develop a rash. °· You have a fever. °Get help right away if: °· You have trouble breathing. °This information is not intended to replace advice given to you by your health care provider. Make sure you discuss any questions you have with your health care provider. °Document Released:  07/11/2013 Document Revised: 09/02/2017 Document Reviewed: 01/10/2016 °Elsevier Patient Education © 2020 [employer] ° °

## 2019-07-05 NOTE — Procedures (Signed)
Interventional Radiology Procedure Note  Procedure: Right IJ port placement  Complications: None immediate  Estimated Blood Loss: <10 mL  Recommendations: Port okay to use as clinically indicated.   Signed,  Constance Holster, MD

## 2019-07-05 NOTE — Consult Note (Signed)
Chief Complaint: Patient was seen in consultation today for Port-A-Cath placement  Referring Physician(s): [place]  Supervising Physician: Gennie Alma  Patient Status: [name]  History of Present Illness: ROSALINE Medina is a 48 y.o. female with history of hypertension and newly diagnosed cervical cancer who presents today for Port-A-Cath placement for chemotherapy. She took her last xarelto 2d ago and confirms NPO this morning.  Past Medical History:  Diagnosis Date   [facility])    Hypertension    Obstructive thrombus    Sciatica     Past Surgical History:  Procedure Laterality Date   ABDOMINAL AORTOGRAM N/A 05/18/2018   Procedure: ABDOMINAL AORTOGRAM;  Surgeon: Waynetta Sandy, MD;  Location: [place] [facility];  Service: Cardiovascular;  Laterality: N/A;   ABDOMINAL AORTOGRAM W/LOWER EXTREMITY Left 10/26/2018   Procedure: ABDOMINAL AORTOGRAM W/LOWER EXTREMITY;  Surgeon: Waynetta Sandy, MD;  Location: [place] [facility];  Service: Cardiovascular;  Laterality: Left;   LOWER EXTREMITY ANGIOGRAPHY Bilateral 05/18/2018   Procedure: LOWER EXTREMITY ANGIOGRAPHY;  Surgeon: Waynetta Sandy, MD;  Location: [place] [facility];  Service: Cardiovascular;  Laterality: Bilateral;   THROMBECTOMY FEMORAL ARTERY Left 05/18/2018   Procedure: LEFT LEG THROMBECTOMY, BALLOON ANGIOPLASTY LEFT POSTERIOR TIBIAL ARTERY;  Surgeon: Waynetta Sandy, MD;  Location: [place];  Service: Vascular;  Laterality: Left;   TUBAL LIGATION      Allergies: Patient has no known allergies.  Medications: Prior to Admission medications   Medication Sig Start Date End Date Taking? Authorizing Provider  cyclobenzaprine (FLEXERIL) 10 MG tablet Take 10 mg by mouth daily.    Yes [provider]  hydrocortisone 2.5 % lotion Apply topically 2 (two) times daily. Patient taking differently: Apply 1 application topically 2 (two) times daily. Applied to rash on  legs 09/05/18  Yes Pfeiffer, Jeannie Done, MD  aspirin EC 81 MG EC tablet Take 1 tablet (81 mg total) by mouth daily. 05/23/18   Ulyses Amor, PA-C  lidocaine-prilocaine (EMLA) cream Apply to affected area once 06/28/19   Heath Lark, MD  megestrol (MEGACE) 40 MG tablet Take 1 tablet (40 mg total) by mouth 2 (two) times daily. 04/26/19   Emily Filbert, MD  ondansetron (ZOFRAN) 8 MG tablet Take 1 tablet (8 mg total) by mouth every 8 (eight) hours as needed. Start on the third day after chemotherapy. 06/28/19   Heath Lark, MD  prochlorperazine (COMPAZINE) 10 MG tablet Take 1 tablet (10 mg total) by mouth every 6 (six) hours as needed (Nausea or vomiting). 06/28/19   Heath Lark, MD  XARELTO 20 MG TABS tablet Take 1 tablet by mouth once daily 05/11/19   Waynetta Sandy, MD     Family History  Problem Relation Age of Onset   Cancer Mother        brain and uterine   Stroke Brother    Diabetes Brother    Breast cancer Neg Hx     Social History   Socioeconomic History   Marital status: Single    Spouse name: Not on file   Number of children: Not on file   Years of education: Not on file   Highest education level: Not on file  Occupational History   Not on file  Social Needs   Financial resource strain: Not on file   Food insecurity    Worry: Not on file    Inability: Not on file   Transportation needs    Medical: Not on file  Non-medical: Not on file  Tobacco Use   Smoking status: Former Smoker    Packs/day: 1.00    Quit date: 07/02/2016    Years since quitting: 3.0   Smokeless tobacco: Never Used  Substance and Sexual Activity   Alcohol use: No   Drug use: Yes    Frequency: 7.0 times per week    Types: Marijuana    Comment: smokes blunt on occasion   Sexual activity: Not on file  Lifestyle   Physical activity    Days per week: Not on file    Minutes per session: Not on file   Stress: Not on file  Relationships   Social connections    Talks on  phone: Not on file    Gets together: Not on file    Attends religious service: Not on file    Active member of club or organization: Not on file    Attends meetings of clubs or organizations: Not on file    Relationship status: Not on file  Other Topics Concern   Not on file  Social History Narrative   Not on file      Review of Systems  Vital Signs: Ht 5\' 6"  (1.676 m)    Wt 230 lb (104.3 kg)    LMP 04/14/2019 (Approximate) Comment: pt states tubes are tied   BMI 37.12 kg/m   Physical Exam  Alert and oriented, obese. Throat clear - grade 2 tonsils. Heart- RRR, clear to auscultation bilaterally. Non-tender abdomen with no lower extremity edema.   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 New diagnosis. EXAM: CT CHEST, ABDOMEN, AND PELVIS WITH CONTRAST TECHNIQUE: Multidetector CT imaging of the chest, abdomen and pelvis was performed following the standard protocol during bolus administration of intravenous contrast. CONTRAST:  166mL OMNIPAQUE IOHEXOL 300 MG/ML  SOLN COMPARISON:  None. FINDINGS: CT CHEST FINDINGS Cardiovascular: No significant vascular findings. Normal heart size. No pericardial effusion. Mediastinum/Nodes: No axillary or supraclavicular adenopathy. No mediastinal or hilar adenopathy. No pericardial effusion esophagus normal Lungs/Pleura: Small subpleural nodule in the RIGHT upper lobe measures 3 mm (image 47/6). No suspicious pulmonary nodules otherwise. Musculoskeletal: No aggressive osseous lesion. CT ABDOMEN AND PELVIS FINDINGS Hepatobiliary: No focal hepatic lesion. Multiple stones packed the lumen of the gallbladder. No inflammation. Common bile duct normal. Pancreas: Pancreas is normal. No ductal dilatation. No pancreatic inflammation. Spleen: Normal spleen Adrenals/urinary tract: Adrenal glands and kidneys are normal. The ureters and bladder normal. Stomach/Bowel: Stomach, small  bowel, appendix, and cecum are normal. The colon and rectosigmoid colon are normal. Vascular/Lymphatic: Abdominal aorta normal caliber. No periaortic retroperitoneal adenopathy. No periportal adenopathy. In the pelvis, no iliac lymphadenopathy. several lymph nodes which are less than 10 mm short axis. Example RIGHT operator node measures 5 mm (image 106/2). Similar 4 mm node on the LEFT on image 26/2. Reproductive: There is low attenuation within the endometrial canal of the uterine fundus. There is no evidence of gross extension beyond the myometrium of the lower uterine segment or cervical uterus. There is a rounded lesion along the anterior wall the fundus measuring 2 cm which corresponds to leiomyoma on comparison ultrasound. The endometrium is poorly imaged by CT. Other: No free fluid Musculoskeletal: No aggressive osseous lesion. IMPRESSION: Chest Impression: 1. No evidence of thoracic metastasis. 2. Small RIGHT upper lobe pulmonary nodule favored benign. Abdomen / Pelvis Impression: 1. No direct extension beyond the myometrium of the uterus, lower uterine segment or  cervix. 2. No clear evidence metastatic adenopathy in the pelvis. Several subcentimeter iliac lymph nodes are noted. 3. No evidence distant metastatic disease or adenopathy in the abdomen pelvis. 4. Uterus is poorly evaluated by CT. Probable leiomyoma in the anterior wall. 5. No skeletal metastasis. Electronically Signed   By: Suzy Bouchard M.D.   On: 06/05/2019 16:58   Mr Pelvis W [id number] Contrast  Result Date: 06/11/2019 CLINICAL DATA:  Newly diagnosed cervical squamous cell carcinoma. EXAM: MRI PELVIS WITHOUT AND WITH CONTRAST TECHNIQUE: Multiplanar multisequence MR imaging of the pelvis was performed both before and after administration of intravenous contrast. CONTRAST:  10 mL Gadavist COMPARISON:  None. FINDINGS: Lower Urinary Tract: No urinary bladder or urethral abnormality identified. Bowel: Unremarkable appearance of rectum and other  pelvic bowel loops. Vascular/Lymphatic: Unremarkable. No pathologically enlarged pelvic lymph nodes identified. Reproductive: -- Uterus: Measures 12.7 x 6.8 x 6.8 cm (volume = 310 cm^3). An intramural fibroid is seen in the left anterior corpus which measures 2.8 x 2.6 cm. Another tiny subserosal fibroid is seen in the fundus measuring approximately 1.7 cm. In addition, there is ill-defined thickening of the myometrial junctional zone in the uterine fundus with a few tiny cystic foci, consistent with adenomyosis. Mild hematometros is seen. A barrel-shaped hypoenhancing soft tissue mass is seen in the cervix which measures 4.5 by 2.8 x 2.4 cm, consistent with cervical carcinoma. This appears to show deep invasion of the cervical fibrous stroma greater than 5 mm. No evidence of extra-uterine extension into the parametrial soft tissues or vagina. -- Right ovary: Appears normal. No mass or inflammatory process identified. -- Left ovary: Appears normal. No mass or inflammatory process identified. Other: No peritoneal thickening or abnormal free fluid. Musculoskeletal:  Unremarkable. IMPRESSION: 4.4 cm barrel shaped soft tissue mass in the cervix, consistent with known cervical carcinoma. No evidence of parametrial involvement or other pelvic metastatic disease. FIGO stage IB3 by imaging. Mild hydrometros. Small uterine fibroids and adenomyosis. Normal appearance of both ovaries.  No adnexal mass identified. Electronically Signed   By: Marlaine Hind M.D.   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 New diagnosis. EXAM: CT CHEST, ABDOMEN, AND PELVIS WITH CONTRAST TECHNIQUE: Multidetector CT imaging of the chest, abdomen and pelvis was performed following the standard protocol during bolus administration of intravenous contrast. CONTRAST:  142mL OMNIPAQUE IOHEXOL 300 MG/ML  SOLN COMPARISON:  None. FINDINGS: CT  CHEST FINDINGS Cardiovascular: No significant vascular findings. Normal heart size. No pericardial effusion. Mediastinum/Nodes: No axillary or supraclavicular adenopathy. No mediastinal or hilar adenopathy. No pericardial effusion esophagus normal Lungs/Pleura: Small subpleural nodule in the RIGHT upper lobe measures 3 mm (image 47/6). No suspicious pulmonary nodules otherwise. Musculoskeletal: No aggressive osseous lesion. CT ABDOMEN AND PELVIS FINDINGS Hepatobiliary: No focal hepatic lesion. Multiple stones packed the lumen of the gallbladder. No inflammation. Common bile duct normal. Pancreas: Pancreas is normal. No ductal dilatation. No pancreatic inflammation. Spleen: Normal spleen Adrenals/urinary tract: Adrenal glands and kidneys are normal. The ureters and bladder normal. Stomach/Bowel: Stomach, small bowel, appendix, and cecum are normal. The colon and rectosigmoid colon are normal. Vascular/Lymphatic: Abdominal aorta normal caliber. No periaortic retroperitoneal adenopathy. No periportal adenopathy. In the pelvis, no iliac lymphadenopathy. several lymph nodes which are less than 10 mm short axis. Example RIGHT operator node measures 5 mm (image 106/2). Similar 4 mm node on the LEFT on image 26/2. Reproductive: There is low attenuation within  the endometrial canal of the uterine fundus. There is no evidence of gross extension beyond the myometrium of the lower uterine segment or cervical uterus. There is a rounded lesion along the anterior wall the fundus measuring 2 cm which corresponds to leiomyoma on comparison ultrasound. The endometrium is poorly imaged by CT. Other: No free fluid Musculoskeletal: No aggressive osseous lesion. IMPRESSION: Chest Impression: 1. No evidence of thoracic metastasis. 2. Small RIGHT upper lobe pulmonary nodule favored benign. Abdomen / Pelvis Impression: 1. No direct extension beyond the myometrium of the uterus, lower uterine segment or cervix. 2. No clear evidence  metastatic adenopathy in the pelvis. Several subcentimeter iliac lymph nodes are noted. 3. No evidence distant metastatic disease or adenopathy in the abdomen pelvis. 4. Uterus is poorly evaluated by CT. Probable leiomyoma in the anterior wall. 5. No skeletal metastasis. Electronically Signed   By: Suzy Bouchard M.D.   On: 06/05/2019 16:58   Nm Pet Image Initial (pi) Skull Base To Thigh  Result Date: 06/28/2019 CLINICAL DATA:  Initial treatment strategy for squamous cell carcinoma of the cervix. Staging 1b 3 by imaging. Initial staging with plans for chemo radiation therapy. EXAM: NUCLEAR MEDICINE PET SKULL BASE TO THIGH TECHNIQUE: 13.0 mCi F-18 FDG was injected intravenously. Full-ring PET imaging was performed from the skull base to thigh after the radiotracer. CT data was obtained and used for attenuation correction and anatomic localization. Fasting blood glucose: 142 mg/dl COMPARISON:  MRI pelvis June 08, 2019, CT June 05, 2019 FINDINGS: Mediastinal blood pool activity: SUV max 2.5 Liver activity: SUV max NA NECK: No hypermetabolic lymph nodes in the neck. Incidental CT findings: none CHEST: No hypermetabolic mediastinal or hilar nodes. No suspicious pulmonary nodules on the CT scan. Incidental CT findings: none ABDOMEN/PELVIS: Within the cervix/lower uterine segment, there is a 5 cm circumferential hypermetabolic lesion with SUV max equal 13.0. No evidence of extension beyond the myometrium. No evidence of soft tissue extension the pelvis. No hypermetabolic lymph nodes in the pelvis. No hypermetabolic periaortic lymph nodes. No evidence liver metastasis or soft tissue metastasis otherwise in the abdomen pelvis. Incidental CT findings: The gallbladder is packed with gallstones SKELETON: No focal hypermetabolic activity to suggest skeletal metastasis. Incidental CT findings: none IMPRESSION: 1. Intense hypermetabolic lesion within the lower uterine segment/cervix consistent with known cervical  carcinoma. No evidence of extension beyond the myometrium. 2. No hypermetabolic pelvic lymphadenopathy or periaortic retroperitoneal adenopathy. 3. No evidence of distant metastatic disease or skeletal disease. Electronically Signed   By: Suzy Bouchard M.D.   On: 06/28/2019 09:00    Labs:  CBC: Recent Labs    09/20/18 1326 10/26/18 0608 05/24/19 1034 06/01/19 1133  WBC 6.9  --  5.7 6.9  HGB 10.0* 8.8* 11.8 11.6*  HCT 33.2* 26.0* 37.5 37.2  PLT 418*  --  461* 329    COAGS: Recent Labs    09/20/18 1326  INR 1.72  APTT 42*    BMP: Recent Labs    09/20/18 1326 10/26/18 0608 10/26/18 0612 06/01/19 1133  NA 139 142  --  142  K 2.8* 3.6  --  4.5  CL 101  --   --  110  CO2 25  --   --  23  GLUCOSE 92 112*  --  107*  BUN <5*  --   --  9  CALCIUM 8.8*  --   --  9.3  CREATININE 0.84  --  0.80 0.90  GFRNONAA >60  --   --  >  60  GFRAA >60  --   --  >60    LIVER FUNCTION TESTS: Recent Labs    09/20/18 1326  BILITOT 0.4  AST 19  ALT 12  ALKPHOS 66  PROT 7.6  ALBUMIN 3.5    TUMOR MARKERS: No results for input(s): AFPTM, CEA, CA199, CHROMGRNA in the last 8760 hours.   Assessment and Plan: Patient diagnosed with cervical cancer and will begin chemotherapy next week. She presents today for porta cath placement.   Risks and benefits of image guided port-a-catheter placement was discussed with the patient including, but not limited to bleeding, infection, pneumothorax, or fibrin sheath development and need for additional procedures.  All of the patient's questions were answered, patient is agreeable to proceed. Consent signed and in chart.   Thank you for this interesting consult.  I greatly enjoyed meeting Emma Medina and look forward to participating in their care.  A copy of this report was sent to the requesting provider on this date.  Electronically Signed: D. Rowe Robert, PA-C/ Mickie Kay PA-S 07/05/2019, 12:57 PM   I spent a total of 25 minutes in  face to face in clinical consultation, greater than 50% of which was counseling/coordinating care for port-a cath placement

## 2019-07-05 NOTE — Telephone Encounter (Signed)
Received call from patient inquiring about having anything to eat or drink prior to her port being placed today @ 12noon.  Advised patient that her instructions are for nothing to eat or drink after 7 am this morning.  Pt very upset that she could not have anything to drink for the next 2 hours as it was 10am when she called. Advised that it was for her safety during the procedure that she cannot have anything to eat or drink as she would be sedated during the procedure. Pt asked when she would be able to have something to drink. Advised that it would be after the procedure and the staff would give her something at the appropriate time.  Pt remained very upset despite explanations and hung up her phone.

## 2019-07-06 ENCOUNTER — Telehealth: Payer: Self-pay | Admitting: Oncology

## 2019-07-06 ENCOUNTER — Other Ambulatory Visit: Payer: Self-pay | Admitting: Hematology and Oncology

## 2019-07-06 NOTE — Telephone Encounter (Signed)
Called [place] after port placement.  She said the port site is sore and she is taking Tylenol.  She took her Xarelto this morning.  Advised her to call if she has any questions.

## 2019-07-09 ENCOUNTER — Ambulatory Visit
Admission: RE | Admit: 2019-07-09 | Discharge: 2019-07-09 | Disposition: A | Discharge status: Home / Self Care | Payer: BLUE CROSS/BLUE SHIELD | Source: Ambulatory Visit | Attending: Radiation Oncology | Admitting: Radiation Oncology

## 2019-07-09 ENCOUNTER — Other Ambulatory Visit: Payer: Self-pay

## 2019-07-09 ENCOUNTER — Telehealth: Payer: Self-pay | Admitting: Oncology

## 2019-07-09 DIAGNOSIS — C538 Malignant neoplasm of overlapping sites of cervix uteri: Secondary | ICD-10-CM | POA: Diagnosis not present

## 2019-07-09 NOTE — Telephone Encounter (Signed)
Called Emma Medina regarding her after hours call about her port dressing.  Advised her it is ok to remove the dressing.  She said she did remove it over the weekend.

## 2019-07-10 ENCOUNTER — Inpatient Hospital Stay: Payer: BLUE CROSS/BLUE SHIELD | Attending: Gynecologic Oncology

## 2019-07-10 ENCOUNTER — Other Ambulatory Visit: Payer: Self-pay

## 2019-07-10 ENCOUNTER — Ambulatory Visit
Admission: RE | Admit: 2019-07-10 | Discharge: 2019-07-10 | Disposition: A | Discharge status: Home / Self Care | Payer: BLUE CROSS/BLUE SHIELD | Source: Ambulatory Visit | Attending: Radiation Oncology | Admitting: Radiation Oncology

## 2019-07-10 ENCOUNTER — Inpatient Hospital Stay: Payer: BLUE CROSS/BLUE SHIELD

## 2019-07-10 ENCOUNTER — Telehealth: Payer: Self-pay | Admitting: Oncology

## 2019-07-10 ENCOUNTER — Other Ambulatory Visit: Payer: Self-pay | Admitting: Hematology and Oncology

## 2019-07-10 VITALS — BP 119/88 | HR 86 | Temp 98.9°F | Resp 20

## 2019-07-10 DIAGNOSIS — R109 Unspecified abdominal pain: Secondary | ICD-10-CM | POA: Diagnosis not present

## 2019-07-10 DIAGNOSIS — R197 Diarrhea, unspecified: Secondary | ICD-10-CM | POA: Diagnosis not present

## 2019-07-10 DIAGNOSIS — Z7982 Long term (current) use of aspirin: Secondary | ICD-10-CM | POA: Diagnosis not present

## 2019-07-10 DIAGNOSIS — Z5111 Encounter for antineoplastic chemotherapy: Secondary | ICD-10-CM | POA: Diagnosis present

## 2019-07-10 DIAGNOSIS — C538 Malignant neoplasm of overlapping sites of cervix uteri: Secondary | ICD-10-CM | POA: Diagnosis not present

## 2019-07-10 DIAGNOSIS — R918 Other nonspecific abnormal finding of lung field: Secondary | ICD-10-CM | POA: Diagnosis not present

## 2019-07-10 DIAGNOSIS — H9319 Tinnitus, unspecified ear: Secondary | ICD-10-CM | POA: Diagnosis not present

## 2019-07-10 DIAGNOSIS — Z86718 Personal history of other venous thrombosis and embolism: Secondary | ICD-10-CM | POA: Diagnosis not present

## 2019-07-10 DIAGNOSIS — Z7901 Long term (current) use of anticoagulants: Secondary | ICD-10-CM | POA: Diagnosis not present

## 2019-07-10 DIAGNOSIS — R112 Nausea with vomiting, unspecified: Secondary | ICD-10-CM | POA: Insufficient documentation

## 2019-07-10 DIAGNOSIS — Z79899 Other long term (current) drug therapy: Secondary | ICD-10-CM | POA: Insufficient documentation

## 2019-07-10 DIAGNOSIS — E876 Hypokalemia: Secondary | ICD-10-CM | POA: Insufficient documentation

## 2019-07-10 LAB — BASIC METABOLIC PANEL - CANCER CENTER ONLY
Anion gap: 8 (ref 5–15)
BUN: 11 mg/dL (ref 6–20)
CO2: 20 mmol/L — ABNORMAL LOW (ref 22–32)
Calcium: 8.9 mg/dL (ref 8.9–10.3)
Chloride: 112 mmol/L — ABNORMAL HIGH (ref 98–111)
Creatinine: 0.77 mg/dL (ref 0.44–1.00)
GFR, Est AFR Am: >60 mL/min (ref >60)
GFR, Estimated: >60 mL/min (ref >60)
Glucose, Bld: 137 mg/dL — ABNORMAL HIGH (ref 70–99)
Potassium: 3.6 mmol/L (ref 3.5–5.1)
Sodium: 140 mmol/L (ref 135–145)

## 2019-07-10 LAB — CBC WITH DIFFERENTIAL (CANCER CENTER ONLY)
Abs Immature Granulocytes: 0.01 10*3/uL (ref 0.00–0.07)
Basophils Absolute: 0.1 10*3/uL (ref 0.0–0.1)
Basophils Relative: 1 %
Eosinophils Absolute: 0.2 10*3/uL (ref 0.0–0.5)
Eosinophils Relative: 4 %
HCT: 36.8 % (ref 36.0–46.0)
Hemoglobin: 12.1 g/dL (ref 12.0–15.0)
Immature Granulocytes: 0 %
Lymphocytes Relative: 33 %
Lymphs Abs: 1.8 10*3/uL (ref 0.7–4.0)
MCH: 25.7 pg — ABNORMAL LOW (ref 26.0–34.0)
MCHC: 32.9 g/dL (ref 30.0–36.0)
MCV: 78.1 fL — ABNORMAL LOW (ref 80.0–100.0)
Monocytes Absolute: 0.4 10*3/uL (ref 0.1–1.0)
Monocytes Relative: 7 %
Neutro Abs: 3 10*3/uL (ref 1.7–7.7)
Neutrophils Relative %: 55 %
Platelet Count: 313 10*3/uL (ref 150–400)
RBC: 4.71 MIL/uL (ref 3.87–5.11)
RDW: 24.6 % — ABNORMAL HIGH (ref 11.5–15.5)
WBC Count: 5.4 10*3/uL (ref 4.0–10.5)
nRBC: 0 % (ref 0.0–0.2)

## 2019-07-10 LAB — MAGNESIUM: Magnesium: 1.8 mg/dL (ref 1.7–2.4)

## 2019-07-10 MED ORDER — HEPARIN SOD (PORK) LOCK FLUSH 100 UNIT/ML IV SOLN
500.0000 [IU] | Freq: Once | INTRAVENOUS | Status: AC | PRN
  Administered 2019-07-10: 17:00:00 500 [IU]
  Filled 2019-07-10: qty 5

## 2019-07-10 MED ORDER — SODIUM CHLORIDE 0.9 % IV SOLN
Freq: Once | INTRAVENOUS | Status: AC
  Administered 2019-07-10: 13:00:00 via INTRAVENOUS
  Filled 2019-07-10: qty 5

## 2019-07-10 MED ORDER — SODIUM CHLORIDE 0.9 % IV SOLN
40.0000 mg/m2 | Freq: Once | INTRAVENOUS | Status: AC
  Administered 2019-07-10: 76 mg via INTRAVENOUS
  Filled 2019-07-10: qty 76

## 2019-07-10 MED ORDER — SODIUM CHLORIDE 0.9% FLUSH
10.0000 mL | INTRAVENOUS | Status: DC | PRN
  Administered 2019-07-10: 17:00:00 10 mL
  Filled 2019-07-10: qty 10

## 2019-07-10 MED ORDER — SODIUM CHLORIDE 0.9 % IV SOLN
Freq: Once | INTRAVENOUS | Status: AC
  Administered 2019-07-10: 10:00:00 via INTRAVENOUS
  Filled 2019-07-10: qty 250

## 2019-07-10 MED ORDER — PALONOSETRON HCL INJECTION 0.25 MG/5ML
INTRAVENOUS | Status: AC
  Filled 2019-07-10: qty 5

## 2019-07-10 MED ORDER — PALONOSETRON HCL INJECTION 0.25 MG/5ML
0.2500 mg | Freq: Once | INTRAVENOUS | Status: AC
  Administered 2019-07-10: 13:00:00 0.25 mg via INTRAVENOUS

## 2019-07-10 MED ORDER — POTASSIUM CHLORIDE 2 MEQ/ML IV SOLN
Freq: Once | INTRAVENOUS | Status: AC
  Administered 2019-07-10: 11:00:00 via INTRAVENOUS
  Filled 2019-07-10: qty 10

## 2019-07-10 NOTE — Telephone Encounter (Signed)
Called Emma Medina and advised her that she can stop taking Megace now that she has started chemotherapy/radiation per Dr. Alvy Bimler.  Advised her to call if she has any heavy vaginal bleeding.  She verbalized agreement.

## 2019-07-10 NOTE — Patient Instructions (Signed)
[facility] Discharge Instructions for Patients Receiving Chemotherapy  Today you received the following chemotherapy agents Cisplatin (PLATINOL).  To help prevent nausea and vomiting after your treatment, we encourage you to take your nausea medication as prescribed.   If you develop nausea and vomiting that is not controlled by your nausea medication, call the clinic.   BELOW ARE SYMPTOMS THAT SHOULD BE REPORTED IMMEDIATELY:  *FEVER GREATER THAN 100.5 F  *CHILLS WITH OR WITHOUT FEVER  NAUSEA AND VOMITING THAT IS NOT CONTROLLED WITH YOUR NAUSEA MEDICATION  *UNUSUAL SHORTNESS OF BREATH  *UNUSUAL BRUISING OR BLEEDING  TENDERNESS IN MOUTH AND THROAT WITH OR WITHOUT PRESENCE OF ULCERS  *URINARY PROBLEMS  *BOWEL PROBLEMS  UNUSUAL RASH Items with * indicate a potential emergency and should be followed up as soon as possible.  Feel free to call the clinic should you have any questions or concerns. The clinic phone number is [phone].  Please show the CHEMO ALERT CARD at check-in to the Emergency Department and triage nurse.  Cisplatin injection What is this medicine? CISPLATIN (SIS pla tin) is a chemotherapy drug. It targets fast dividing cells, like cancer cells, and causes these cells to die. This medicine is used to treat many types of cancer like bladder, ovarian, and testicular cancers. This medicine may be used for other purposes; ask your health care provider or pharmacist if you have questions. COMMON BRAND NAME(S): Platinol, Platinol -AQ What should I tell my health care provider before I take this medicine? They need to know if you have any of these conditions:  blood disorders  hearing problems  kidney disease  recent or ongoing radiation therapy  an unusual or allergic reaction to cisplatin, carboplatin, other chemotherapy, other medicines, foods, dyes, or preservatives  pregnant or trying to get pregnant  breast-feeding How should I  use this medicine? This drug is given as an infusion into a vein. It is administered in a hospital or clinic by a specially trained health care professional. Talk to your pediatrician regarding the use of this medicine in children. Special care may be needed. Overdosage: If you think you have taken too much of this medicine contact a poison control center or emergency room at once. NOTE: This medicine is only for you. Do not share this medicine with others. What if I miss a dose? It is important not to miss a dose. Call your doctor or health care professional if you are unable to keep an appointment. What may interact with this medicine?  dofetilide  foscarnet  medicines for seizures  medicines to increase blood counts like filgrastim, pegfilgrastim, sargramostim  probenecid  pyridoxine used with altretamine  rituximab  some antibiotics like amikacin, gentamicin, neomycin, polymyxin B, streptomycin, tobramycin  sulfinpyrazone  vaccines  zalcitabine Talk to your doctor or health care professional before taking any of these medicines:  acetaminophen  aspirin  ibuprofen  ketoprofen  naproxen This list may not describe all possible interactions. Give your health care provider a list of all the medicines, herbs, non-prescription drugs, or dietary supplements you use. Also tell them if you smoke, drink alcohol, or use illegal drugs. Some items may interact with your medicine. What should I watch for while using this medicine? Your condition will be monitored carefully while you are receiving this medicine. You will need important blood work done while you are taking this medicine. This drug may make you feel generally unwell. This is not uncommon, as chemotherapy can affect healthy cells as well   as cancer cells. Report any side effects. Continue your course of treatment even though you feel ill unless your doctor tells you to stop. In some cases, you may be given additional  medicines to help with side effects. Follow all directions for their use. Call your doctor or health care professional for advice if you get a fever, chills or sore throat, or other symptoms of a cold or flu. Do not treat yourself. This drug decreases your body's ability to fight infections. Try to avoid being around people who are sick. This medicine may increase your risk to bruise or bleed. Call your doctor or health care professional if you notice any unusual bleeding. Be careful brushing and flossing your teeth or using a toothpick because you may get an infection or bleed more easily. If you have any dental work done, tell your dentist you are receiving this medicine. Avoid taking products that contain aspirin, acetaminophen, ibuprofen, naproxen, or ketoprofen unless instructed by your doctor. These medicines may hide a fever. Do not become pregnant while taking this medicine. Women should inform their doctor if they wish to become pregnant or think they might be pregnant. There is a potential for serious side effects to an unborn child. Talk to your health care professional or pharmacist for more information. Do not breast-feed an infant while taking this medicine. Drink fluids as directed while you are taking this medicine. This will help protect your kidneys. Call your doctor or health care professional if you get diarrhea. Do not treat yourself. What side effects may I notice from receiving this medicine? Side effects that you should report to your doctor or health care professional as soon as possible:  allergic reactions like skin rash, itching or hives, swelling of the face, lips, or tongue  signs of infection - fever or chills, cough, sore throat, pain or difficulty passing urine  signs of decreased platelets or bleeding - bruising, pinpoint red spots on the skin, black, tarry stools, nosebleeds  signs of decreased red blood cells - unusually weak or tired, fainting spells,  lightheadedness  breathing problems  changes in hearing  gout pain  low blood counts - This drug may decrease the number of white blood cells, red blood cells and platelets. You may be at increased risk for infections and bleeding.  nausea and vomiting  pain, swelling, redness or irritation at the injection site  pain, tingling, numbness in the hands or feet  problems with balance, movement  trouble passing urine or change in the amount of urine Side effects that usually do not require medical attention (report to your doctor or health care professional if they continue or are bothersome):  changes in vision  loss of appetite  metallic taste in the mouth or changes in taste This list may not describe all possible side effects. Call your doctor for medical advice about side effects. You may report side effects to FDA at 1-800-FDA-1088. Where should I keep my medicine? This drug is given in a hospital or clinic and will not be stored at home. NOTE: This sheet is a summary. It may not cover all possible information. If you have questions about this medicine, talk to your doctor, pharmacist, or health care provider.  2020 Elsevier/Gold Standard (2007-12-26 14:40:54)  Coronavirus (COVID-19) Are you at risk?  Are you at risk for the Coronavirus (COVID-19)?  To be considered HIGH RISK for Coronavirus (COVID-19), you have to meet the following criteria:  . Traveled to [place], [place], [place],   [place] or [place]; or in the [place] to [place], [place], [place], or [place]; and have fever, cough, and shortness of breath within the last 2 weeks of travel OR . Been in close contact with a person diagnosed with COVID-19 within the last 2 weeks and have fever, cough, and shortness of breath . IF YOU DO NOT MEET THESE CRITERIA, YOU ARE CONSIDERED LOW RISK FOR COVID-19.  What to do if you are HIGH RISK for COVID-19?  . If you are having a medical emergency, call 911. . Seek  medical care right away. Before you go to a doctor's office, urgent care or emergency department, call ahead and tell them about your recent travel, contact with someone diagnosed with COVID-19, and your symptoms. You should receive instructions from your physician's office regarding next steps of care.  . When you arrive at healthcare provider, tell the healthcare staff immediately you have returned from visiting [place], [place], [place], [place] or [place]; or traveled in the [place] to [place], [place], [place], or [place]; in the last two weeks or you have been in close contact with a person diagnosed with COVID-19 in the last 2 weeks.   . Tell the health care staff about your symptoms: fever, cough and shortness of breath. . After you have been seen by a medical provider, you will be either: o Tested for (COVID-19) and discharged home on quarantine except to seek medical care if symptoms worsen, and asked to  - Stay home and avoid contact with others until you get your results (4-5 days)  - Avoid travel on public transportation if possible (such as bus, train, or airplane) or o Sent to the Emergency Department by EMS for evaluation, COVID-19 testing, and possible admission depending on your condition and test results.  What to do if you are LOW RISK for COVID-19?  Reduce your risk of any infection by using the same precautions used for avoiding the common cold or flu:  . Wash your hands often with soap and warm water for at least 20 seconds.  If soap and water are not readily available, use an alcohol-based hand sanitizer with at least 60% alcohol.  . If coughing or sneezing, cover your mouth and nose by coughing or sneezing into the elbow areas of your shirt or coat, into a tissue or into your sleeve (not your hands). . Avoid shaking hands with others and consider head nods or verbal greetings only. . Avoid touching your eyes, nose, or mouth with unwashed hands.  . Avoid close  contact with people who are sick. . Avoid places or events with large numbers of people in one location, like concerts or sporting events. . Carefully consider travel plans you have or are making. . If you are planning any travel outside or inside the [place], visit the CDC's Travelers' Health webpage for the latest health notices. . If you have some symptoms but not all symptoms, continue to monitor at home and seek medical attention if your symptoms worsen. . If you are having a medical emergency, call 911.   ADDITIONAL HEALTHCARE OPTIONS FOR PATIENTS  [facility] / e-Visit: [email or web address]         [facility]: [phone]  [facility]: [phone]                   [facility]: [phone]   

## 2019-07-10 NOTE — Progress Notes (Signed)
Ok to start hydration while CMP is resulting per Dr. Alvy Bimler.

## 2019-07-11 ENCOUNTER — Ambulatory Visit
Admission: RE | Admit: 2019-07-11 | Discharge: 2019-07-11 | Disposition: A | Discharge status: Home / Self Care | Payer: BLUE CROSS/BLUE SHIELD | Source: Ambulatory Visit | Attending: Radiation Oncology | Admitting: Radiation Oncology

## 2019-07-11 ENCOUNTER — Other Ambulatory Visit: Payer: Self-pay

## 2019-07-11 ENCOUNTER — Telehealth: Payer: Self-pay | Admitting: *Deleted

## 2019-07-11 DIAGNOSIS — C538 Malignant neoplasm of overlapping sites of cervix uteri: Secondary | ICD-10-CM | POA: Diagnosis not present

## 2019-07-12 ENCOUNTER — Encounter: Payer: Self-pay | Admitting: *Deleted

## 2019-07-12 ENCOUNTER — Ambulatory Visit
Admission: RE | Admit: 2019-07-12 | Discharge: 2019-07-12 | Disposition: A | Discharge status: Home / Self Care | Payer: BLUE CROSS/BLUE SHIELD | Source: Ambulatory Visit | Attending: Radiation Oncology | Admitting: Radiation Oncology

## 2019-07-12 DIAGNOSIS — C538 Malignant neoplasm of overlapping sites of cervix uteri: Secondary | ICD-10-CM | POA: Diagnosis not present

## 2019-07-12 NOTE — Progress Notes (Signed)
[facility] Work  Clinical Social Work was referred by medical oncology RN for assessment of psychosocial needs.  Clinical Social Worker met with patient after radiation oncology to offer support and assess for needs.  Ms. Reigel shared she does not have medical insurance provided by her employer.  CSW referred patient to Ailene Ravel, financial advocate, to discuss Medicaid and [facility] financial assistance. CSW provided patient with [employer] card.    Gwinda Maine, LCSW  Clinical Social Worker [facility]

## 2019-07-13 ENCOUNTER — Inpatient Hospital Stay: Payer: BLUE CROSS/BLUE SHIELD

## 2019-07-13 ENCOUNTER — Other Ambulatory Visit: Payer: Self-pay

## 2019-07-13 ENCOUNTER — Ambulatory Visit
Admission: RE | Admit: 2019-07-13 | Discharge: 2019-07-13 | Disposition: A | Discharge status: Home / Self Care | Payer: BLUE CROSS/BLUE SHIELD | Source: Ambulatory Visit | Attending: Radiation Oncology | Admitting: Radiation Oncology

## 2019-07-13 DIAGNOSIS — C538 Malignant neoplasm of overlapping sites of cervix uteri: Secondary | ICD-10-CM

## 2019-07-13 LAB — BASIC METABOLIC PANEL - CANCER CENTER ONLY
Anion gap: 10 (ref 5–15)
BUN: 14 mg/dL (ref 6–20)
CO2: 22 mmol/L (ref 22–32)
Calcium: 8.6 mg/dL — ABNORMAL LOW (ref 8.9–10.3)
Chloride: 108 mmol/L (ref 98–111)
Creatinine: 0.82 mg/dL (ref 0.44–1.00)
GFR, Est AFR Am: >60 mL/min (ref >60)
GFR, Estimated: >60 mL/min (ref >60)
Glucose, Bld: 117 mg/dL — ABNORMAL HIGH (ref 70–99)
Potassium: 3.3 mmol/L — ABNORMAL LOW (ref 3.5–5.1)
Sodium: 140 mmol/L (ref 135–145)

## 2019-07-13 LAB — CBC WITH DIFFERENTIAL (CANCER CENTER ONLY)
Abs Immature Granulocytes: 0.01 10*3/uL (ref 0.00–0.07)
Basophils Absolute: 0 10*3/uL (ref 0.0–0.1)
Basophils Relative: 0 %
Eosinophils Absolute: 0 10*3/uL (ref 0.0–0.5)
Eosinophils Relative: 1 %
HCT: 37.2 % (ref 36.0–46.0)
Hemoglobin: 12.1 g/dL (ref 12.0–15.0)
Immature Granulocytes: 0 %
Lymphocytes Relative: 44 %
Lymphs Abs: 2.2 10*3/uL (ref 0.7–4.0)
MCH: 25.5 pg — ABNORMAL LOW (ref 26.0–34.0)
MCHC: 32.5 g/dL (ref 30.0–36.0)
MCV: 78.3 fL — ABNORMAL LOW (ref 80.0–100.0)
Monocytes Absolute: 0.3 10*3/uL (ref 0.1–1.0)
Monocytes Relative: 7 %
Neutro Abs: 2.4 10*3/uL (ref 1.7–7.7)
Neutrophils Relative %: 48 %
Platelet Count: 295 10*3/uL (ref 150–400)
RBC: 4.75 MIL/uL (ref 3.87–5.11)
RDW: 24.5 % — ABNORMAL HIGH (ref 11.5–15.5)
WBC Count: 5 10*3/uL (ref 4.0–10.5)
nRBC: 0 % (ref 0.0–0.2)

## 2019-07-13 LAB — MAGNESIUM: Magnesium: 1.8 mg/dL (ref 1.7–2.4)

## 2019-07-16 ENCOUNTER — Ambulatory Visit
Admission: RE | Admit: 2019-07-16 | Discharge: 2019-07-16 | Disposition: A | Discharge status: Home / Self Care | Payer: BLUE CROSS/BLUE SHIELD | Source: Ambulatory Visit | Attending: Radiation Oncology | Admitting: Radiation Oncology

## 2019-07-16 ENCOUNTER — Other Ambulatory Visit: Payer: Self-pay

## 2019-07-16 ENCOUNTER — Encounter: Payer: Self-pay | Admitting: Hematology and Oncology

## 2019-07-16 ENCOUNTER — Inpatient Hospital Stay (HOSPITAL_BASED_OUTPATIENT_CLINIC_OR_DEPARTMENT_OTHER): Payer: BLUE CROSS/BLUE SHIELD | Admitting: Hematology and Oncology

## 2019-07-16 DIAGNOSIS — C538 Malignant neoplasm of overlapping sites of cervix uteri: Secondary | ICD-10-CM | POA: Diagnosis not present

## 2019-07-16 DIAGNOSIS — R197 Diarrhea, unspecified: Secondary | ICD-10-CM

## 2019-07-16 DIAGNOSIS — E876 Hypokalemia: Secondary | ICD-10-CM

## 2019-07-16 DIAGNOSIS — R112 Nausea with vomiting, unspecified: Secondary | ICD-10-CM

## 2019-07-16 MED ORDER — DEXAMETHASONE 4 MG PO TABS: 4.0000 mg | ORAL_TABLET | Freq: Every day | ORAL | 0 refills | Status: DC

## 2019-07-16 NOTE — Assessment & Plan Note (Signed)
She tolerated treatment poorly with delayed nausea and diarrhea I recommend addition of dexamethasone for 3 days starting Friday every cycle We will continue to manage her side effects aggressively

## 2019-07-16 NOTE — Assessment & Plan Note (Signed)
She has severe nausea and vomiting with treatment Recommend additional dexamethasone I reinforced the importance of taking antiemetics

## 2019-07-16 NOTE — Progress Notes (Signed)
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 [facility]) She tolerated treatment poorly with delayed nausea and diarrhea I recommend addition of dexamethasone for 3 days starting Friday every cycle We will continue to manage her side effects aggressively  Nausea with vomiting She has severe nausea and vomiting with treatment Recommend additional dexamethasone I reinforced the importance of taking antiemetics  Hypokalemia due to excessive gastrointestinal loss of potassium Her hypokalemia is likely due to GI loss Recommend Imodium and potassium rich diet  Diarrhea This is multifactorial, likely due to side effects of treatment Recommend increase hydration and Imodium   No orders of the defined types were placed in this encounter.   INTERVAL HISTORY: Please see below for problem oriented charting. 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  She was placed on Xarelto and aspirin for following this procedure.  She had already been experiencing some irregular menses, however after commencing anticoagulant therapy her menstrual cycle became even heavier and more irregular with daily bleeding   09/20/2018 Imaging   US pelvis Negative aside from subserosal appearing 2.5 centimeter fibroid at the right fundus.   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 No evidence of thoracic metastasis. 2. Small RIGHT upper lobe pulmonary nodule favored benign.   Abdomen / Pelvis Impression:   1. No direct extension beyond the myometrium of the uterus, lower uterine segment or cervix. 2. No clear evidence metastatic adenopathy in the pelvis. Several subcentimeter iliac lymph nodes are noted. 3. No evidence distant metastatic disease or adenopathy in the abdomen pelvis. 4. Uterus is poorly evaluated by CT. Probable leiomyoma in the anterior wall. 5. No skeletal metastasis.   06/08/2019 Imaging   MRI pelvis 4.4 cm barrel shaped soft tissue mass in the cervix, consistent with known cervical carcinoma. No evidence of parametrial involvement or other pelvic metastatic disease. FIGO stage IB3 by imaging.   Mild hydrometros.   Small uterine fibroids and adenomyosis.   Normal appearance of both ovaries.  No adnexal mass identified.     06/28/2019 Cancer Staging   Staging form: Cervix Uteri, AJCC 8th Edition - Clinical: FIGO Stage IB1 (cT1b1, cN0, cM0) - Signed by Heath Lark, MD on 06/28/2019   06/28/2019 PET scan   1. Intense hypermetabolic lesion within the lower uterine segment/cervix consistent with known cervical carcinoma. No evidence of extension beyond the myometrium. 2. No hypermetabolic pelvic lymphadenopathy or periaortic retroperitoneal adenopathy. 3. No evidence of distant metastatic disease or skeletal disease.       07/05/2019 Procedure  Successful placement of a right internal jugular approach power injectable Port-A-Cath. The catheter is ready for  immediate use.      Genetic Testing   Patient has genetic testing done for PD-L1 on 06/01/19 biopsy. Results revealed: PD-L1 0%    07/10/2019 -  Chemotherapy   The patient had cisplatin for chemotherapy treatment.       REVIEW OF SYSTEMS:   Constitutional: Denies fevers, chills or abnormal weight loss Eyes: Denies blurriness of vision Ears, nose, mouth, throat, and face: Denies mucositis or sore throat Respiratory: Denies cough, dyspnea or wheezes Cardiovascular: Denies palpitation, chest discomfort or lower extremity swelling Skin: Denies abnormal skin rashes Lymphatics: Denies new lymphadenopathy or easy bruising Neurological:Denies numbness, tingling or new weaknesses Behavioral/Psych: Mood is stable, no new changes  All other systems were reviewed with the patient and are negative.  I have reviewed the past medical history, past surgical history, social history and family history with the patient and they are unchanged from previous note.  ALLERGIES:  has No Known Allergies.  MEDICATIONS:  Current Outpatient Medications  Medication Sig Dispense Refill  . aspirin EC 81 MG EC tablet Take 1 tablet (81 mg total) by mouth daily.    . cyclobenzaprine (FLEXERIL) 10 MG tablet Take 10 mg by mouth daily.     Marland Kitchen dexamethasone (DECADRON) 4 MG tablet Take 1 tablet (4 mg total) by mouth daily. 30 tablet 0  . hydrocortisone 2.5 % lotion Apply topically 2 (two) times daily. (Patient taking differently: Apply 1 application topically 2 (two) times daily. Applied to rash on legs) 118 mL 0  . lidocaine-prilocaine (EMLA) cream Apply to affected area once 30 g 3  . ondansetron (ZOFRAN) 8 MG tablet Take 1 tablet (8 mg total) by mouth every 8 (eight) hours as needed. Start on the third day after chemotherapy. 30 tablet 1  . prochlorperazine (COMPAZINE) 10 MG tablet Take 1 tablet (10 mg total) by mouth every 6 (six) hours as needed (Nausea or vomiting). 30 tablet 1  . XARELTO 20 MG TABS tablet Take  1 tablet by mouth once daily 30 tablet 6   No current facility-administered medications for this visit.     PHYSICAL EXAMINATION: ECOG PERFORMANCE STATUS: 1 - Symptomatic but completely ambulatory  Vitals:   07/16/19 1120  BP: 99/72  Pulse: 80  Resp: 18  Temp: 98.7 F (37.1 C)  SpO2: 100%   Filed Weights   07/16/19 1120  Weight: 226 lb 6.4 oz (102.7 kg)    GENERAL:alert, no distress and comfortable SKIN: skin color, texture, turgor are normal, no rashes or significant lesions EYES: normal, Conjunctiva are pink and non-injected, sclera clear OROPHARYNX:no exudate, no erythema and lips, buccal mucosa, and tongue normal  NECK: supple, thyroid normal size, non-tender, without nodularity LYMPH:  no palpable lymphadenopathy in the cervical, axillary or inguinal LUNGS: clear to auscultation and percussion with normal breathing effort HEART: regular rate & rhythm and no murmurs and no lower extremity edema ABDOMEN:abdomen soft, non-tender and normal bowel sounds Musculoskeletal:no cyanosis of digits and no clubbing  NEURO: alert & oriented x 3 with fluent speech, no focal motor/sensory deficits  LABORATORY DATA:  I have reviewed the data as listed    Component Value Date/Time   NA 140 07/13/2019 1043   K 3.3 (L) 07/13/2019 1043   CL 108 07/13/2019 1043   CO2 22 07/13/2019 1043   GLUCOSE 117 (H) 07/13/2019 1043   BUN 14 07/13/2019 1043   CREATININE 0.82 07/13/2019 1043  CALCIUM 8.6 (L) 07/13/2019 1043   PROT 7.6 09/20/2018 1326   ALBUMIN 3.5 09/20/2018 1326   AST 19 09/20/2018 1326   ALT 12 09/20/2018 1326   ALKPHOS 66 09/20/2018 1326   BILITOT 0.4 09/20/2018 1326   GFRNONAA >60 07/13/2019 1043   GFRAA >60 07/13/2019 1043    No results found for: SPEP, UPEP  Lab Results  Component Value Date   WBC 5.0 07/13/2019   NEUTROABS 2.4 07/13/2019   HGB 12.1 07/13/2019   HCT 37.2 07/13/2019   MCV 78.3 (L) 07/13/2019   PLT 295 07/13/2019      Chemistry       Component Value Date/Time   NA 140 07/13/2019 1043   K 3.3 (L) 07/13/2019 1043   CL 108 07/13/2019 1043   CO2 22 07/13/2019 1043   BUN 14 07/13/2019 1043   CREATININE 0.82 07/13/2019 1043      Component Value Date/Time   CALCIUM 8.6 (L) 07/13/2019 1043   ALKPHOS 66 09/20/2018 1326   AST 19 09/20/2018 1326   ALT 12 09/20/2018 1326   BILITOT 0.4 09/20/2018 1326       RADIOGRAPHIC STUDIES: I have personally reviewed the radiological images as listed and agreed with the findings in the report. Nm Pet Image Initial (pi) Skull Base To Thigh  Result Date: 06/28/2019 CLINICAL DATA:  Initial treatment strategy for squamous cell carcinoma of the cervix. Staging 1b 3 by imaging. Initial staging with plans for chemo radiation therapy. EXAM: NUCLEAR MEDICINE PET SKULL BASE TO THIGH TECHNIQUE: 13.0 mCi F-18 FDG was injected intravenously. Full-ring PET imaging was performed from the skull base to thigh after the radiotracer. CT data was obtained and used for attenuation correction and anatomic localization. Fasting blood glucose: 142 mg/dl COMPARISON:  MRI pelvis June 08, 2019, CT June 05, 2019 FINDINGS: Mediastinal blood pool activity: SUV max 2.5 Liver activity: SUV max NA NECK: No hypermetabolic lymph nodes in the neck. Incidental CT findings: none CHEST: No hypermetabolic mediastinal or hilar nodes. No suspicious pulmonary nodules on the CT scan. Incidental CT findings: none ABDOMEN/PELVIS: Within the cervix/lower uterine segment, there is a 5 cm circumferential hypermetabolic lesion with SUV max equal 13.0. No evidence of extension beyond the myometrium. No evidence of soft tissue extension the pelvis. No hypermetabolic lymph nodes in the pelvis. No hypermetabolic periaortic lymph nodes. No evidence liver metastasis or soft tissue metastasis otherwise in the abdomen pelvis. Incidental CT findings: The gallbladder is packed with gallstones SKELETON: No focal hypermetabolic activity to suggest  skeletal metastasis. Incidental CT findings: none IMPRESSION: 1. Intense hypermetabolic lesion within the lower uterine segment/cervix consistent with known cervical carcinoma. No evidence of extension beyond the myometrium. 2. No hypermetabolic pelvic lymphadenopathy or periaortic retroperitoneal adenopathy. 3. No evidence of distant metastatic disease or skeletal disease. Electronically Signed   By: Suzy Bouchard M.D.   On: 06/28/2019 09:00   [facility] Imaging Guided Port Insertion  Result Date: 07/05/2019 INDICATION: 48 year old female with cervical cancer. A request for Port-A-Cath placement was made to allow for chemotherapy treatment. EXAM: IMPLANTED PORT A CATH PLACEMENT WITH ULTRASOUND AND FLUOROSCOPIC GUIDANCE MEDICATIONS: Ancef 2 gm IV; The antibiotic was administered within an appropriate time interval prior to skin puncture. ANESTHESIA/SEDATION: Moderate (conscious) sedation was employed during this procedure. A total of Versed 2 mg and Fentanyl 100 mcg was administered intravenously. Moderate Sedation Time: 31 minutes. The patient's level of consciousness and vital signs were monitored continuously by radiology nursing throughout the procedure under my direct supervision.  FLUOROSCOPY TIME:  0 minutes, 30 seconds COMPLICATIONS: None immediate. PROCEDURE: The procedure, risks, benefits, and alternatives were explained to the patient. Questions regarding the procedure were encouraged and answered. The patient understands and consents to the procedure. The right neck and chest were prepped with chlorhexidine in a sterile fashion, and a sterile drape was applied covering the operative field. Maximum barrier sterile technique with sterile gowns and gloves were used for the procedure. A timeout was performed prior to the initiation of the procedure. Local anesthesia was provided with 1% lidocaine with epinephrine. After creating a small venotomy incision, a micropuncture kit was utilized to access the internal  jugular vein under direct, real-time ultrasound guidance. Ultrasound image documentation was performed. The microwire was kinked to measure appropriate catheter length. A subcutaneous port pocket was then created along the upper chest wall utilizing a combination of sharp and blunt dissection. The pocket was irrigated with sterile saline. A single lumen ISP power injectable port was chosen for placement. The 8 Fr catheter was tunneled from the port pocket site to the venotomy incision. The port was placed in the pocket. The external catheter was trimmed to appropriate length. At the venotomy, an 8 Fr peel-away sheath was placed over a guidewire under fluoroscopic guidance. The catheter was then placed through the sheath and the sheath was removed. Final catheter positioning was confirmed and documented with a fluoroscopic spot radiograph. The port was accessed with a Huber needle, aspirated and flushed with heparinized saline. The venotomy site was closed with Dermabond. The port pocket incision was closed with interrupted 3-0 Vicryl suture and the skin was opposed with a running subcuticular 4-0 Vicryl suture. Dermabond was applied to the port pocket incision. Dressings were placed. The patient tolerated the procedure well without immediate post procedural complication. IMPRESSION: Successful placement of a right internal jugular approach power injectable Port-A-Cath. The catheter is ready for immediate use. Electronically Signed   By: Constance Holster M.D.   On: 07/05/2019 14:20    All questions were answered. The patient knows to call the clinic with any problems, questions or concerns. No barriers to learning was detected.  I spent 15 minutes counseling the patient face to face. The total time spent in the appointment was 20 minutes and more than 50% was on counseling and review of test results  Heath Lark, MD 07/16/2019 12:49 PM

## 2019-07-16 NOTE — Assessment & Plan Note (Signed)
Her hypokalemia is likely due to GI loss Recommend Imodium and potassium rich diet

## 2019-07-16 NOTE — Assessment & Plan Note (Signed)
This is multifactorial, likely due to side effects of treatment Recommend increase hydration and Imodium

## 2019-07-17 ENCOUNTER — Other Ambulatory Visit: Payer: Self-pay | Admitting: Radiation Oncology

## 2019-07-17 ENCOUNTER — Ambulatory Visit
Admission: RE | Admit: 2019-07-17 | Discharge: 2019-07-17 | Disposition: A | Discharge status: Home / Self Care | Payer: BLUE CROSS/BLUE SHIELD | Source: Ambulatory Visit | Attending: Radiation Oncology | Admitting: Radiation Oncology

## 2019-07-17 ENCOUNTER — Other Ambulatory Visit (HOSPITAL_COMMUNITY): Payer: Self-pay | Admitting: Radiation Oncology

## 2019-07-17 ENCOUNTER — Inpatient Hospital Stay: Payer: BLUE CROSS/BLUE SHIELD

## 2019-07-17 ENCOUNTER — Other Ambulatory Visit: Payer: Self-pay

## 2019-07-17 VITALS — BP 106/81 | HR 92 | Temp 98.5°F | Resp 20

## 2019-07-17 DIAGNOSIS — C538 Malignant neoplasm of overlapping sites of cervix uteri: Secondary | ICD-10-CM

## 2019-07-17 DIAGNOSIS — Z8541 Personal history of malignant neoplasm of cervix uteri: Secondary | ICD-10-CM

## 2019-07-17 MED ORDER — PALONOSETRON HCL INJECTION 0.25 MG/5ML
INTRAVENOUS | Status: AC
  Filled 2019-07-17: qty 5

## 2019-07-17 MED ORDER — SODIUM CHLORIDE 0.9 % IV SOLN
Freq: Once | INTRAVENOUS | Status: AC
  Administered 2019-07-17: 12:00:00 via INTRAVENOUS
  Filled 2019-07-17: qty 5

## 2019-07-17 MED ORDER — SODIUM CHLORIDE 0.9 % IV SOLN
Freq: Once | INTRAVENOUS | Status: DC
  Filled 2019-07-17: qty 250

## 2019-07-17 MED ORDER — SODIUM CHLORIDE 0.9 % IV SOLN
40.0000 mg/m2 | Freq: Once | INTRAVENOUS | Status: AC
  Administered 2019-07-17: 76 mg via INTRAVENOUS
  Filled 2019-07-17: qty 76

## 2019-07-17 MED ORDER — HEPARIN SOD (PORK) LOCK FLUSH 100 UNIT/ML IV SOLN
500.0000 [IU] | Freq: Once | INTRAVENOUS | Status: AC | PRN
  Administered 2019-07-17: 500 [IU]
  Filled 2019-07-17: qty 5

## 2019-07-17 MED ORDER — PALONOSETRON HCL INJECTION 0.25 MG/5ML
0.2500 mg | Freq: Once | INTRAVENOUS | Status: AC
  Administered 2019-07-17: 0.25 mg via INTRAVENOUS

## 2019-07-17 MED ORDER — POTASSIUM CHLORIDE 2 MEQ/ML IV SOLN
Freq: Once | INTRAVENOUS | Status: AC
  Administered 2019-07-17: 10:00:00 via INTRAVENOUS
  Filled 2019-07-17: qty 10

## 2019-07-17 MED ORDER — SODIUM CHLORIDE 0.9% FLUSH
10.0000 mL | INTRAVENOUS | Status: DC | PRN
  Administered 2019-07-17: 10 mL
  Filled 2019-07-17: qty 10

## 2019-07-17 NOTE — Patient Instructions (Signed)
[facility] Discharge Instructions for Patients Receiving Chemotherapy  Today you received the following chemotherapy agents Cisplatin (PLATINOL).  To help prevent nausea and vomiting after your treatment, we encourage you to take your nausea medication as prescribed.   If you develop nausea and vomiting that is not controlled by your nausea medication, call the clinic.   BELOW ARE SYMPTOMS THAT SHOULD BE REPORTED IMMEDIATELY:  *FEVER GREATER THAN 100.5 F  *CHILLS WITH OR WITHOUT FEVER  NAUSEA AND VOMITING THAT IS NOT CONTROLLED WITH YOUR NAUSEA MEDICATION  *UNUSUAL SHORTNESS OF BREATH  *UNUSUAL BRUISING OR BLEEDING  TENDERNESS IN MOUTH AND THROAT WITH OR WITHOUT PRESENCE OF ULCERS  *URINARY PROBLEMS  *BOWEL PROBLEMS  UNUSUAL RASH Items with * indicate a potential emergency and should be followed up as soon as possible.  Feel free to call the clinic should you have any questions or concerns. The clinic phone number is [phone].  Please show the CHEMO ALERT CARD at check-in to the Emergency Department and triage nurse.  Cisplatin injection What is this medicine? CISPLATIN (SIS pla tin) is a chemotherapy drug. It targets fast dividing cells, like cancer cells, and causes these cells to die. This medicine is used to treat many types of cancer like bladder, ovarian, and testicular cancers. This medicine may be used for other purposes; ask your health care provider or pharmacist if you have questions. COMMON BRAND NAME(S): Platinol, Platinol -AQ What should I tell my health care provider before I take this medicine? They need to know if you have any of these conditions:  blood disorders  hearing problems  kidney disease  recent or ongoing radiation therapy  an unusual or allergic reaction to cisplatin, carboplatin, other chemotherapy, other medicines, foods, dyes, or preservatives  pregnant or trying to get pregnant  breast-feeding How should I  use this medicine? This drug is given as an infusion into a vein. It is administered in a hospital or clinic by a specially trained health care professional. Talk to your pediatrician regarding the use of this medicine in children. Special care may be needed. Overdosage: If you think you have taken too much of this medicine contact a poison control center or emergency room at once. NOTE: This medicine is only for you. Do not share this medicine with others. What if I miss a dose? It is important not to miss a dose. Call your doctor or health care professional if you are unable to keep an appointment. What may interact with this medicine?  dofetilide  foscarnet  medicines for seizures  medicines to increase blood counts like filgrastim, pegfilgrastim, sargramostim  probenecid  pyridoxine used with altretamine  rituximab  some antibiotics like amikacin, gentamicin, neomycin, polymyxin B, streptomycin, tobramycin  sulfinpyrazone  vaccines  zalcitabine Talk to your doctor or health care professional before taking any of these medicines:  acetaminophen  aspirin  ibuprofen  ketoprofen  naproxen This list may not describe all possible interactions. Give your health care provider a list of all the medicines, herbs, non-prescription drugs, or dietary supplements you use. Also tell them if you smoke, drink alcohol, or use illegal drugs. Some items may interact with your medicine. What should I watch for while using this medicine? Your condition will be monitored carefully while you are receiving this medicine. You will need important blood work done while you are taking this medicine. This drug may make you feel generally unwell. This is not uncommon, as chemotherapy can affect healthy cells as well   as cancer cells. Report any side effects. Continue your course of treatment even though you feel ill unless your doctor tells you to stop. In some cases, you may be given additional  medicines to help with side effects. Follow all directions for their use. Call your doctor or health care professional for advice if you get a fever, chills or sore throat, or other symptoms of a cold or flu. Do not treat yourself. This drug decreases your body's ability to fight infections. Try to avoid being around people who are sick. This medicine may increase your risk to bruise or bleed. Call your doctor or health care professional if you notice any unusual bleeding. Be careful brushing and flossing your teeth or using a toothpick because you may get an infection or bleed more easily. If you have any dental work done, tell your dentist you are receiving this medicine. Avoid taking products that contain aspirin, acetaminophen, ibuprofen, naproxen, or ketoprofen unless instructed by your doctor. These medicines may hide a fever. Do not become pregnant while taking this medicine. Women should inform their doctor if they wish to become pregnant or think they might be pregnant. There is a potential for serious side effects to an unborn child. Talk to your health care professional or pharmacist for more information. Do not breast-feed an infant while taking this medicine. Drink fluids as directed while you are taking this medicine. This will help protect your kidneys. Call your doctor or health care professional if you get diarrhea. Do not treat yourself. What side effects may I notice from receiving this medicine? Side effects that you should report to your doctor or health care professional as soon as possible:  allergic reactions like skin rash, itching or hives, swelling of the face, lips, or tongue  signs of infection - fever or chills, cough, sore throat, pain or difficulty passing urine  signs of decreased platelets or bleeding - bruising, pinpoint red spots on the skin, black, tarry stools, nosebleeds  signs of decreased red blood cells - unusually weak or tired, fainting spells,  lightheadedness  breathing problems  changes in hearing  gout pain  low blood counts - This drug may decrease the number of white blood cells, red blood cells and platelets. You may be at increased risk for infections and bleeding.  nausea and vomiting  pain, swelling, redness or irritation at the injection site  pain, tingling, numbness in the hands or feet  problems with balance, movement  trouble passing urine or change in the amount of urine Side effects that usually do not require medical attention (report to your doctor or health care professional if they continue or are bothersome):  changes in vision  loss of appetite  metallic taste in the mouth or changes in taste This list may not describe all possible side effects. Call your doctor for medical advice about side effects. You may report side effects to FDA at 1-800-FDA-1088. Where should I keep my medicine? This drug is given in a hospital or clinic and will not be stored at home. NOTE: This sheet is a summary. It may not cover all possible information. If you have questions about this medicine, talk to your doctor, pharmacist, or health care provider.  2020 Elsevier/Gold Standard (2007-12-26 14:40:54)  Coronavirus (COVID-19) Are you at risk?  Are you at risk for the Coronavirus (COVID-19)?  To be considered HIGH RISK for Coronavirus (COVID-19), you have to meet the following criteria:  . Traveled to [place], [place], [place],   [place] or [place]; or in the [place] to [place], [place], [place], or [place]; and have fever, cough, and shortness of breath within the last 2 weeks of travel OR . Been in close contact with a person diagnosed with COVID-19 within the last 2 weeks and have fever, cough, and shortness of breath . IF YOU DO NOT MEET THESE CRITERIA, YOU ARE CONSIDERED LOW RISK FOR COVID-19.  What to do if you are HIGH RISK for COVID-19?  . If you are having a medical emergency, call 911. . Seek  medical care right away. Before you go to a doctor's office, urgent care or emergency department, call ahead and tell them about your recent travel, contact with someone diagnosed with COVID-19, and your symptoms. You should receive instructions from your physician's office regarding next steps of care.  . When you arrive at healthcare provider, tell the healthcare staff immediately you have returned from visiting [place], [place], [place], [place] or [place]; or traveled in the [place] to [place], [place], [place], or [place]; in the last two weeks or you have been in close contact with a person diagnosed with COVID-19 in the last 2 weeks.   . Tell the health care staff about your symptoms: fever, cough and shortness of breath. . After you have been seen by a medical provider, you will be either: o Tested for (COVID-19) and discharged home on quarantine except to seek medical care if symptoms worsen, and asked to  - Stay home and avoid contact with others until you get your results (4-5 days)  - Avoid travel on public transportation if possible (such as bus, train, or airplane) or o Sent to the Emergency Department by EMS for evaluation, COVID-19 testing, and possible admission depending on your condition and test results.  What to do if you are LOW RISK for COVID-19?  Reduce your risk of any infection by using the same precautions used for avoiding the common cold or flu:  . Wash your hands often with soap and warm water for at least 20 seconds.  If soap and water are not readily available, use an alcohol-based hand sanitizer with at least 60% alcohol.  . If coughing or sneezing, cover your mouth and nose by coughing or sneezing into the elbow areas of your shirt or coat, into a tissue or into your sleeve (not your hands). . Avoid shaking hands with others and consider head nods or verbal greetings only. . Avoid touching your eyes, nose, or mouth with unwashed hands.  . Avoid close  contact with people who are sick. . Avoid places or events with large numbers of people in one location, like concerts or sporting events. . Carefully consider travel plans you have or are making. . If you are planning any travel outside or inside the [place], visit the CDC's Travelers' Health webpage for the latest health notices. . If you have some symptoms but not all symptoms, continue to monitor at home and seek medical attention if your symptoms worsen. . If you are having a medical emergency, call 911.   ADDITIONAL HEALTHCARE OPTIONS FOR PATIENTS  [facility] / e-Visit: [email or web address]         [facility]: [phone]  [facility]: [phone]                   [facility]: [phone]   

## 2019-07-18 ENCOUNTER — Ambulatory Visit
Admission: RE | Admit: 2019-07-18 | Discharge: 2019-07-18 | Disposition: A | Discharge status: Home / Self Care | Payer: BLUE CROSS/BLUE SHIELD | Source: Ambulatory Visit | Attending: Radiation Oncology | Admitting: Radiation Oncology

## 2019-07-18 ENCOUNTER — Other Ambulatory Visit: Payer: Self-pay

## 2019-07-18 DIAGNOSIS — C538 Malignant neoplasm of overlapping sites of cervix uteri: Secondary | ICD-10-CM | POA: Diagnosis not present

## 2019-07-19 ENCOUNTER — Other Ambulatory Visit: Payer: Self-pay

## 2019-07-19 ENCOUNTER — Ambulatory Visit
Admission: RE | Admit: 2019-07-19 | Discharge: 2019-07-19 | Disposition: A | Discharge status: Home / Self Care | Payer: BLUE CROSS/BLUE SHIELD | Source: Ambulatory Visit | Attending: Radiation Oncology | Admitting: Radiation Oncology

## 2019-07-19 ENCOUNTER — Ambulatory Visit (HOSPITAL_COMMUNITY): Payer: Self-pay

## 2019-07-19 ENCOUNTER — Other Ambulatory Visit (HOSPITAL_COMMUNITY): Payer: Self-pay | Admitting: *Deleted

## 2019-07-19 DIAGNOSIS — Z1231 Encounter for screening mammogram for malignant neoplasm of breast: Secondary | ICD-10-CM

## 2019-07-19 DIAGNOSIS — C538 Malignant neoplasm of overlapping sites of cervix uteri: Secondary | ICD-10-CM | POA: Diagnosis not present

## 2019-07-20 ENCOUNTER — Other Ambulatory Visit: Payer: Self-pay

## 2019-07-20 ENCOUNTER — Inpatient Hospital Stay: Payer: BLUE CROSS/BLUE SHIELD

## 2019-07-20 ENCOUNTER — Ambulatory Visit
Admission: RE | Admit: 2019-07-20 | Discharge: 2019-07-20 | Disposition: A | Discharge status: Home / Self Care | Payer: BLUE CROSS/BLUE SHIELD | Source: Ambulatory Visit | Attending: Radiation Oncology | Admitting: Radiation Oncology

## 2019-07-20 ENCOUNTER — Encounter: Payer: Self-pay | Admitting: Hematology and Oncology

## 2019-07-20 DIAGNOSIS — C538 Malignant neoplasm of overlapping sites of cervix uteri: Secondary | ICD-10-CM | POA: Diagnosis not present

## 2019-07-20 LAB — CBC WITH DIFFERENTIAL (CANCER CENTER ONLY)
Abs Immature Granulocytes: 0.01 10*3/uL (ref 0.00–0.07)
Basophils Absolute: 0 10*3/uL (ref 0.0–0.1)
Basophils Relative: 1 %
Eosinophils Absolute: 0.2 10*3/uL (ref 0.0–0.5)
Eosinophils Relative: 4 %
HCT: 37.6 % (ref 36.0–46.0)
Hemoglobin: 12.5 g/dL (ref 12.0–15.0)
Immature Granulocytes: 0 %
Lymphocytes Relative: 33 %
Lymphs Abs: 1.3 10*3/uL (ref 0.7–4.0)
MCH: 26.2 pg (ref 26.0–34.0)
MCHC: 33.2 g/dL (ref 30.0–36.0)
MCV: 78.8 fL — ABNORMAL LOW (ref 80.0–100.0)
Monocytes Absolute: 0.4 10*3/uL (ref 0.1–1.0)
Monocytes Relative: 11 %
Neutro Abs: 2 10*3/uL (ref 1.7–7.7)
Neutrophils Relative %: 51 %
Platelet Count: 236 10*3/uL (ref 150–400)
RBC: 4.77 MIL/uL (ref 3.87–5.11)
RDW: 22 % — ABNORMAL HIGH (ref 11.5–15.5)
WBC Count: 3.9 10*3/uL — ABNORMAL LOW (ref 4.0–10.5)
nRBC: 0 % (ref 0.0–0.2)

## 2019-07-20 LAB — BASIC METABOLIC PANEL - CANCER CENTER ONLY
Anion gap: 9 (ref 5–15)
BUN: 17 mg/dL (ref 6–20)
CO2: 21 mmol/L — ABNORMAL LOW (ref 22–32)
Calcium: 8.4 mg/dL — ABNORMAL LOW (ref 8.9–10.3)
Chloride: 108 mmol/L (ref 98–111)
Creatinine: 1.05 mg/dL — ABNORMAL HIGH (ref 0.44–1.00)
GFR, Est AFR Am: >60 mL/min (ref >60)
GFR, Estimated: >60 mL/min (ref >60)
Glucose, Bld: 125 mg/dL — ABNORMAL HIGH (ref 70–99)
Potassium: 3.5 mmol/L (ref 3.5–5.1)
Sodium: 138 mmol/L (ref 135–145)

## 2019-07-20 LAB — MAGNESIUM: Magnesium: 1.7 mg/dL (ref 1.7–2.4)

## 2019-07-20 NOTE — Progress Notes (Signed)
Met w/ pt to introduce myself as her Arboriculturist.  Pt is uninsured so I gave her a Medicaid application and advised she turn the completed app along with the required docs needed for processing to the DSS in her county.  I also gave her an application to apply for a discount thru the hospital.  I informed her of the [employer], went over what it covers, gave her the income requirement and an expense sheet.  She would like to apply so she will bring her proof of income to her next visit.  She has my card for any questions or concerns she may have in the future.

## 2019-07-23 ENCOUNTER — Other Ambulatory Visit: Payer: Self-pay

## 2019-07-23 ENCOUNTER — Ambulatory Visit
Admission: RE | Admit: 2019-07-23 | Discharge: 2019-07-23 | Disposition: A | Discharge status: Home / Self Care | Payer: BLUE CROSS/BLUE SHIELD | Source: Ambulatory Visit | Attending: Radiation Oncology | Admitting: Radiation Oncology

## 2019-07-23 ENCOUNTER — Inpatient Hospital Stay (HOSPITAL_BASED_OUTPATIENT_CLINIC_OR_DEPARTMENT_OTHER): Payer: BLUE CROSS/BLUE SHIELD | Admitting: Hematology and Oncology

## 2019-07-23 ENCOUNTER — Encounter: Payer: Self-pay | Admitting: Hematology and Oncology

## 2019-07-23 DIAGNOSIS — H9313 Tinnitus, bilateral: Secondary | ICD-10-CM | POA: Insufficient documentation

## 2019-07-23 DIAGNOSIS — R197 Diarrhea, unspecified: Secondary | ICD-10-CM

## 2019-07-23 DIAGNOSIS — C538 Malignant neoplasm of overlapping sites of cervix uteri: Secondary | ICD-10-CM | POA: Diagnosis not present

## 2019-07-23 DIAGNOSIS — R112 Nausea with vomiting, unspecified: Secondary | ICD-10-CM

## 2019-07-23 NOTE — Assessment & Plan Note (Signed)
She continues to have significant side effects from treatment including severe diarrhea, minimum nausea but tinnitus Plan to reduce the dose of cisplatin a little bit We will proceed with treatment as scheduled

## 2019-07-23 NOTE — Assessment & Plan Note (Signed)
This has improved since the addition of oral dexamethasone for 3 days after treatment She will continue the same

## 2019-07-23 NOTE — Assessment & Plan Note (Signed)
This is due to side effects of treatment She is advised to continue to take Imodium and increase hydration as tolerated

## 2019-07-23 NOTE — Progress Notes (Signed)
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   INTERVAL HISTORY: Please see below for problem oriented charting. She returns for cycle 3 of chemotherapy With the introduction of dexamethasone, she have less nausea.  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  She was placed on Xarelto and aspirin for following this procedure.  She had already been experiencing some irregular menses, however after commencing anticoagulant therapy her menstrual cycle became even heavier and more irregular with daily bleeding    09/20/2018 Imaging   US pelvis Negative aside from subserosal appearing 2.5 centimeter fibroid at the right fundus.   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 No evidence of thoracic metastasis. 2. Small RIGHT upper lobe pulmonary nodule favored benign.   Abdomen / Pelvis Impression:   1. No direct extension beyond the myometrium of the uterus, lower uterine segment or cervix. 2. No clear evidence metastatic adenopathy in the pelvis. Several subcentimeter iliac lymph nodes are noted. 3. No evidence distant metastatic disease or adenopathy in the abdomen pelvis. 4. Uterus is poorly evaluated by CT. Probable leiomyoma in the anterior wall. 5. No skeletal metastasis.   06/08/2019 Imaging   MRI pelvis 4.4 cm barrel shaped soft tissue mass in the cervix, consistent with known cervical carcinoma. No evidence of parametrial involvement or other pelvic metastatic disease. FIGO stage IB3 by imaging.   Mild hydrometros.   Small uterine fibroids and adenomyosis.   Normal appearance of both ovaries.  No adnexal mass identified.     06/28/2019 Cancer Staging   Staging form: Cervix Uteri, AJCC 8th Edition - Clinical: FIGO Stage IB1 (cT1b1, cN0, cM0) - Signed by Heath Lark, MD on 06/28/2019   06/28/2019 PET scan   1. Intense hypermetabolic lesion within the lower uterine segment/cervix consistent with known cervical  carcinoma. No evidence of extension beyond the myometrium. 2. No hypermetabolic pelvic lymphadenopathy or periaortic retroperitoneal adenopathy. 3. No evidence of distant metastatic disease or  skeletal disease.       07/05/2019 Procedure   Successful placement of a right internal jugular approach power injectable Port-A-Cath. The catheter is ready for immediate use.      Genetic Testing   Patient has genetic testing done for PD-L1 on 06/01/19 biopsy. Results revealed: PD-L1 0%    07/10/2019 -  Chemotherapy   The patient had cisplatin for chemotherapy treatment.       REVIEW OF SYSTEMS:   Constitutional: Denies fevers, chills or abnormal weight loss Eyes: Denies blurriness of vision Ears, nose, mouth, throat, and face: Denies mucositis or sore throat Respiratory: Denies cough, dyspnea or wheezes Cardiovascular: Denies palpitation, chest discomfort or lower extremity swelling Skin: Denies abnormal skin rashes Lymphatics: Denies new lymphadenopathy or easy bruising Behavioral/Psych: Mood is stable, no new changes  All other systems were reviewed with the patient and are negative.  I have reviewed the past medical history, past surgical history, social history and family history with the patient and they are unchanged from previous note.  ALLERGIES:  has No Known Allergies.  MEDICATIONS:  Current Outpatient Medications  Medication Sig Dispense Refill  . aspirin EC 81 MG EC tablet Take 1 tablet (81 mg total) by mouth daily.    . cyclobenzaprine (FLEXERIL) 10 MG tablet Take 10 mg by mouth daily.     Marland Kitchen dexamethasone (DECADRON) 4 MG tablet Take 1 tablet (4 mg total) by mouth daily. 30 tablet 0  . hydrocortisone 2.5 % lotion Apply topically 2 (two) times daily. (Patient taking differently: Apply 1 application topically 2 (two) times daily. Applied to rash on legs) 118 mL 0  . lidocaine-prilocaine (EMLA) cream Apply to affected area once 30 g 3  . ondansetron (ZOFRAN) 8 MG tablet Take 1 tablet (8 mg total) by mouth every 8 (eight) hours as needed. Start on the third day after chemotherapy. 30 tablet 1  . prochlorperazine (COMPAZINE) 10 MG tablet Take 1 tablet (10 mg total)  by mouth every 6 (six) hours as needed (Nausea or vomiting). 30 tablet 1  . XARELTO 20 MG TABS tablet Take 1 tablet by mouth once daily 30 tablet 6   No current facility-administered medications for this visit.     PHYSICAL EXAMINATION: ECOG PERFORMANCE STATUS: 1 - Symptomatic but completely ambulatory  Vitals:   07/23/19 1109  BP: 127/87  Pulse: 71  Resp: 18  Temp: 98.3 F (36.8 C)  SpO2: 100%   Filed Weights   07/23/19 1109  Weight: 224 lb (101.6 kg)    GENERAL:alert, no distress and comfortable SKIN: skin color, texture, turgor are normal, no rashes or significant lesions EYES: normal, Conjunctiva are pink and non-injected, sclera clear OROPHARYNX:no exudate, no erythema and lips, buccal mucosa, and tongue normal  NECK: supple, thyroid normal size, non-tender, without nodularity LYMPH:  no palpable lymphadenopathy in the cervical, axillary or inguinal LUNGS: clear to auscultation and percussion with normal breathing effort HEART: regular rate & rhythm and no murmurs and no lower extremity edema ABDOMEN:abdomen soft, non-tender and normal bowel sounds Musculoskeletal:no cyanosis of digits and no clubbing  NEURO: alert & oriented x 3 with fluent speech, no focal motor/sensory deficits  LABORATORY DATA:  I have reviewed the data as listed    Component Value Date/Time   NA 138 07/20/2019 1107   K 3.5 07/20/2019 1107   CL 108  07/20/2019 1107   CO2 21 (L) 07/20/2019 1107   GLUCOSE 125 (H) 07/20/2019 1107   BUN 17 07/20/2019 1107   CREATININE 1.05 (H) 07/20/2019 1107   CALCIUM 8.4 (L) 07/20/2019 1107   PROT 7.6 09/20/2018 1326   ALBUMIN 3.5 09/20/2018 1326   AST 19 09/20/2018 1326   ALT 12 09/20/2018 1326   ALKPHOS 66 09/20/2018 1326   BILITOT 0.4 09/20/2018 1326   GFRNONAA >60 07/20/2019 1107   GFRAA >60 07/20/2019 1107    No results found for: SPEP, UPEP  Lab Results  Component Value Date   WBC 3.9 (L) 07/20/2019   NEUTROABS 2.0 07/20/2019   HGB 12.5  07/20/2019   HCT 37.6 07/20/2019   MCV 78.8 (L) 07/20/2019   PLT 236 07/20/2019      Chemistry      Component Value Date/Time   NA 138 07/20/2019 1107   K 3.5 07/20/2019 1107   CL 108 07/20/2019 1107   CO2 21 (L) 07/20/2019 1107   BUN 17 07/20/2019 1107   CREATININE 1.05 (H) 07/20/2019 1107      Component Value Date/Time   CALCIUM 8.4 (L) 07/20/2019 1107   ALKPHOS 66 09/20/2018 1326   AST 19 09/20/2018 1326   ALT 12 09/20/2018 1326   BILITOT 0.4 09/20/2018 1326       RADIOGRAPHIC STUDIES: I have personally reviewed the radiological images as listed and agreed with the findings in the report. Nm Pet Image Initial (pi) Skull Base To Thigh  Result Date: 06/28/2019 CLINICAL DATA:  Initial treatment strategy for squamous cell carcinoma of the cervix. Staging 1b 3 by imaging. Initial staging with plans for chemo radiation therapy. EXAM: NUCLEAR MEDICINE PET SKULL BASE TO THIGH TECHNIQUE: 13.0 mCi F-18 FDG was injected intravenously. Full-ring PET imaging was performed from the skull base to thigh after the radiotracer. CT data was obtained and used for attenuation correction and anatomic localization. Fasting blood glucose: 142 mg/dl COMPARISON:  MRI pelvis June 08, 2019, CT June 05, 2019 FINDINGS: Mediastinal blood pool activity: SUV max 2.5 Liver activity: SUV max NA NECK: No hypermetabolic lymph nodes in the neck. Incidental CT findings: none CHEST: No hypermetabolic mediastinal or hilar nodes. No suspicious pulmonary nodules on the CT scan. Incidental CT findings: none ABDOMEN/PELVIS: Within the cervix/lower uterine segment, there is a 5 cm circumferential hypermetabolic lesion with SUV max equal 13.0. No evidence of extension beyond the myometrium. No evidence of soft tissue extension the pelvis. No hypermetabolic lymph nodes in the pelvis. No hypermetabolic periaortic lymph nodes. No evidence liver metastasis or soft tissue metastasis otherwise in the abdomen pelvis. Incidental  CT findings: The gallbladder is packed with gallstones SKELETON: No focal hypermetabolic activity to suggest skeletal metastasis. Incidental CT findings: none IMPRESSION: 1. Intense hypermetabolic lesion within the lower uterine segment/cervix consistent with known cervical carcinoma. No evidence of extension beyond the myometrium. 2. No hypermetabolic pelvic lymphadenopathy or periaortic retroperitoneal adenopathy. 3. No evidence of distant metastatic disease or skeletal disease. Electronically Signed   By: Suzy Bouchard M.D.   On: 06/28/2019 09:00   Ir Imaging Guided Port Insertion  Result Date: 07/05/2019 INDICATION: 48 year old female with cervical cancer. A request for Port-A-Cath placement was made to allow for chemotherapy treatment. EXAM: IMPLANTED PORT A CATH PLACEMENT WITH ULTRASOUND AND FLUOROSCOPIC GUIDANCE MEDICATIONS: Ancef 2 gm IV; The antibiotic was administered within an appropriate time interval prior to skin puncture. ANESTHESIA/SEDATION: Moderate (conscious) sedation was employed during this procedure. A total of Versed 2 mg  and Fentanyl 100 mcg was administered intravenously. Moderate Sedation Time: 31 minutes. The patient's level of consciousness and vital signs were monitored continuously by radiology nursing throughout the procedure under my direct supervision. FLUOROSCOPY TIME:  0 minutes, 30 seconds COMPLICATIONS: None immediate. PROCEDURE: The procedure, risks, benefits, and alternatives were explained to the patient. Questions regarding the procedure were encouraged and answered. The patient understands and consents to the procedure. The right neck and chest were prepped with chlorhexidine in a sterile fashion, and a sterile drape was applied covering the operative field. Maximum barrier sterile technique with sterile gowns and gloves were used for the procedure. A timeout was performed prior to the initiation of the procedure. Local anesthesia was provided with 1% lidocaine with  epinephrine. After creating a small venotomy incision, a micropuncture kit was utilized to access the internal jugular vein under direct, real-time ultrasound guidance. Ultrasound image documentation was performed. The microwire was kinked to measure appropriate catheter length. A subcutaneous port pocket was then created along the upper chest wall utilizing a combination of sharp and blunt dissection. The pocket was irrigated with sterile saline. A single lumen ISP power injectable port was chosen for placement. The 8 Fr catheter was tunneled from the port pocket site to the venotomy incision. The port was placed in the pocket. The external catheter was trimmed to appropriate length. At the venotomy, an 8 Fr peel-away sheath was placed over a guidewire under fluoroscopic guidance. The catheter was then placed through the sheath and the sheath was removed. Final catheter positioning was confirmed and documented with a fluoroscopic spot radiograph. The port was accessed with a Huber needle, aspirated and flushed with heparinized saline. The venotomy site was closed with Dermabond. The port pocket incision was closed with interrupted 3-0 Vicryl suture and the skin was opposed with a running subcuticular 4-0 Vicryl suture. Dermabond was applied to the port pocket incision. Dressings were placed. The patient tolerated the procedure well without immediate post procedural complication. IMPRESSION: Successful placement of a right internal jugular approach power injectable Port-A-Cath. The catheter is ready for immediate use. Electronically Signed   By: Constance Holster M.D.   On: 07/05/2019 14:20    All questions were answered. The patient knows to call the clinic with any problems, questions or concerns. No barriers to learning was detected.  I spent 25 minutes counseling the patient face to face. The total time spent in the appointment was 30 minutes and more than 50% was on counseling and review of test  results  Heath Lark, MD 07/23/2019 11:15 AM

## 2019-07-23 NOTE — Assessment & Plan Note (Signed)
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

## 2019-07-24 ENCOUNTER — Other Ambulatory Visit: Payer: Self-pay

## 2019-07-24 ENCOUNTER — Encounter: Payer: Self-pay | Admitting: Hematology and Oncology

## 2019-07-24 ENCOUNTER — Encounter (HOSPITAL_COMMUNITY): Payer: Self-pay | Admitting: Radiology

## 2019-07-24 ENCOUNTER — Inpatient Hospital Stay: Payer: BLUE CROSS/BLUE SHIELD

## 2019-07-24 ENCOUNTER — Ambulatory Visit
Admission: RE | Admit: 2019-07-24 | Discharge: 2019-07-24 | Disposition: A | Discharge status: Home / Self Care | Payer: BLUE CROSS/BLUE SHIELD | Source: Ambulatory Visit | Attending: Radiation Oncology | Admitting: Radiation Oncology

## 2019-07-24 ENCOUNTER — Other Ambulatory Visit: Payer: Self-pay | Admitting: Hematology and Oncology

## 2019-07-24 VITALS — BP 108/69 | HR 82 | Temp 98.5°F | Resp 17

## 2019-07-24 DIAGNOSIS — C538 Malignant neoplasm of overlapping sites of cervix uteri: Secondary | ICD-10-CM | POA: Diagnosis not present

## 2019-07-24 MED ORDER — HEPARIN SOD (PORK) LOCK FLUSH 100 UNIT/ML IV SOLN
500.0000 [IU] | Freq: Once | INTRAVENOUS | Status: AC | PRN
  Administered 2019-07-24: 17:00:00 500 [IU]
  Filled 2019-07-24: qty 5

## 2019-07-24 MED ORDER — POTASSIUM CHLORIDE 2 MEQ/ML IV SOLN
Freq: Once | INTRAVENOUS | Status: AC
  Administered 2019-07-24: 10:00:00 via INTRAVENOUS
  Filled 2019-07-24: qty 10

## 2019-07-24 MED ORDER — PALONOSETRON HCL INJECTION 0.25 MG/5ML
0.2500 mg | Freq: Once | INTRAVENOUS | Status: AC
  Administered 2019-07-24: 13:00:00 0.25 mg via INTRAVENOUS

## 2019-07-24 MED ORDER — SODIUM CHLORIDE 0.9 % IV SOLN
Freq: Once | INTRAVENOUS | Status: AC
  Administered 2019-07-24: 10:00:00 via INTRAVENOUS
  Filled 2019-07-24: qty 250

## 2019-07-24 MED ORDER — PALONOSETRON HCL INJECTION 0.25 MG/5ML
INTRAVENOUS | Status: AC
  Filled 2019-07-24: qty 5

## 2019-07-24 MED ORDER — SODIUM CHLORIDE 0.9 % IV SOLN
Freq: Once | INTRAVENOUS | Status: AC
  Administered 2019-07-24: 13:00:00 via INTRAVENOUS
  Filled 2019-07-24: qty 5

## 2019-07-24 MED ORDER — SODIUM CHLORIDE 0.9% FLUSH
10.0000 mL | INTRAVENOUS | Status: DC | PRN
  Administered 2019-07-24: 17:00:00 10 mL
  Filled 2019-07-24: qty 10

## 2019-07-24 MED ORDER — SODIUM CHLORIDE 0.9 % IV SOLN
30.0000 mg/m2 | Freq: Once | INTRAVENOUS | Status: AC
  Administered 2019-07-24: 14:00:00 57 mg via INTRAVENOUS
  Filled 2019-07-24: qty 57

## 2019-07-24 NOTE — Patient Instructions (Signed)
[facility] Discharge Instructions for Patients Receiving Chemotherapy  Today you received the following chemotherapy agents Cisplatin (PLATINOL).  To help prevent nausea and vomiting after your treatment, we encourage you to take your nausea medication as prescribed.   If you develop nausea and vomiting that is not controlled by your nausea medication, call the clinic.   BELOW ARE SYMPTOMS THAT SHOULD BE REPORTED IMMEDIATELY:  *FEVER GREATER THAN 100.5 F  *CHILLS WITH OR WITHOUT FEVER  NAUSEA AND VOMITING THAT IS NOT CONTROLLED WITH YOUR NAUSEA MEDICATION  *UNUSUAL SHORTNESS OF BREATH  *UNUSUAL BRUISING OR BLEEDING  TENDERNESS IN MOUTH AND THROAT WITH OR WITHOUT PRESENCE OF ULCERS  *URINARY PROBLEMS  *BOWEL PROBLEMS  UNUSUAL RASH Items with * indicate a potential emergency and should be followed up as soon as possible.  Feel free to call the clinic should you have any questions or concerns. The clinic phone number is [phone].  Please show the CHEMO ALERT CARD at check-in to the Emergency Department and triage nurse.  Coronavirus (COVID-19) Are you at risk?  Are you at risk for the Coronavirus (COVID-19)?  To be considered HIGH RISK for Coronavirus (COVID-19), you have to meet the following criteria:  . Traveled to [place], [place], [place], [place] or [place]; or in the [place] to [place], [place], [place], or [place]; and have fever, cough, and shortness of breath within the last 2 weeks of travel OR . Been in close contact with a person diagnosed with COVID-19 within the last 2 weeks and have fever, cough, and shortness of breath . IF YOU DO NOT MEET THESE CRITERIA, YOU ARE CONSIDERED LOW RISK FOR COVID-19.  What to do if you are HIGH RISK for COVID-19?  . If you are having a medical emergency, call 911. . Seek medical care right away. Before you go to a doctor's office, urgent care or emergency department, call ahead and tell them  about your recent travel, contact with someone diagnosed with COVID-19, and your symptoms. You should receive instructions from your physician's office regarding next steps of care.  . When you arrive at healthcare provider, tell the healthcare staff immediately you have returned from visiting [place], [place], [place], [place] or [place]; or traveled in the [place] to [place], [place], [place], or [place]; in the last two weeks or you have been in close contact with a person diagnosed with COVID-19 in the last 2 weeks.   . Tell the health care staff about your symptoms: fever, cough and shortness of breath. . After you have been seen by a medical provider, you will be either: o Tested for (COVID-19) and discharged home on quarantine except to seek medical care if symptoms worsen, and asked to  - Stay home and avoid contact with others until you get your results (4-5 days)  - Avoid travel on public transportation if possible (such as bus, train, or airplane) or o Sent to the Emergency Department by EMS for evaluation, COVID-19 testing, and possible admission depending on your condition and test results.  What to do if you are LOW RISK for COVID-19?  Reduce your risk of any infection by using the same precautions used for avoiding the common cold or flu:  . Wash your hands often with soap and warm water for at least 20 seconds.  If soap and water are not readily available, use an alcohol-based hand sanitizer with at least 60% alcohol.  . If coughing or   sneezing, cover your mouth and nose by coughing or sneezing into the elbow areas of your shirt or coat, into a tissue or into your sleeve (not your hands). . Avoid shaking hands with others and consider head nods or verbal greetings only. . Avoid touching your eyes, nose, or mouth with unwashed hands.  . Avoid close contact with people who are sick. . Avoid places or events with large numbers of people in one location, like concerts or  sporting events. . Carefully consider travel plans you have or are making. . If you are planning any travel outside or inside the [place], visit the CDC's Travelers' Health webpage for the latest health notices. . If you have some symptoms but not all symptoms, continue to monitor at home and seek medical attention if your symptoms worsen. . If you are having a medical emergency, call 911.   [place] / e-Visit: [email or web address]         [facility]: [facility]: [phone]                   [facility]: [phone]

## 2019-07-24 NOTE — Progress Notes (Signed)
Pt is approved for the $1000 [employer] grant.  

## 2019-07-25 ENCOUNTER — Ambulatory Visit
Admission: RE | Admit: 2019-07-25 | Discharge: 2019-07-25 | Disposition: A | Discharge status: Home / Self Care | Payer: BLUE CROSS/BLUE SHIELD | Source: Ambulatory Visit | Attending: Radiation Oncology | Admitting: Radiation Oncology

## 2019-07-25 ENCOUNTER — Other Ambulatory Visit: Payer: Self-pay

## 2019-07-25 DIAGNOSIS — C538 Malignant neoplasm of overlapping sites of cervix uteri: Secondary | ICD-10-CM | POA: Diagnosis not present

## 2019-07-26 ENCOUNTER — Other Ambulatory Visit: Payer: Self-pay

## 2019-07-26 ENCOUNTER — Ambulatory Visit
Admission: RE | Admit: 2019-07-26 | Discharge: 2019-07-26 | Disposition: A | Discharge status: Home / Self Care | Payer: BLUE CROSS/BLUE SHIELD | Source: Ambulatory Visit | Attending: Radiation Oncology | Admitting: Radiation Oncology

## 2019-07-26 DIAGNOSIS — C538 Malignant neoplasm of overlapping sites of cervix uteri: Secondary | ICD-10-CM | POA: Diagnosis not present

## 2019-07-27 ENCOUNTER — Other Ambulatory Visit: Payer: Self-pay

## 2019-07-27 ENCOUNTER — Ambulatory Visit
Admission: RE | Admit: 2019-07-27 | Discharge: 2019-07-27 | Disposition: A | Discharge status: Home / Self Care | Payer: BLUE CROSS/BLUE SHIELD | Source: Ambulatory Visit | Attending: Radiation Oncology | Admitting: Radiation Oncology

## 2019-07-27 ENCOUNTER — Inpatient Hospital Stay: Payer: BLUE CROSS/BLUE SHIELD

## 2019-07-27 DIAGNOSIS — C538 Malignant neoplasm of overlapping sites of cervix uteri: Secondary | ICD-10-CM

## 2019-07-27 LAB — CBC WITH DIFFERENTIAL (CANCER CENTER ONLY)
Abs Immature Granulocytes: 0.01 10*3/uL (ref 0.00–0.07)
Basophils Absolute: 0 10*3/uL (ref 0.0–0.1)
Basophils Relative: 0 %
Eosinophils Absolute: 0.3 10*3/uL (ref 0.0–0.5)
Eosinophils Relative: 8 %
HCT: 40.6 % (ref 36.0–46.0)
Hemoglobin: 13.7 g/dL (ref 12.0–15.0)
Immature Granulocytes: 0 %
Lymphocytes Relative: 14 %
Lymphs Abs: 0.5 10*3/uL — ABNORMAL LOW (ref 0.7–4.0)
MCH: 26.8 pg (ref 26.0–34.0)
MCHC: 33.7 g/dL (ref 30.0–36.0)
MCV: 79.5 fL — ABNORMAL LOW (ref 80.0–100.0)
Monocytes Absolute: 0.3 10*3/uL (ref 0.1–1.0)
Monocytes Relative: 9 %
Neutro Abs: 2.3 10*3/uL (ref 1.7–7.7)
Neutrophils Relative %: 69 %
Platelet Count: 189 10*3/uL (ref 150–400)
RBC: 5.11 MIL/uL (ref 3.87–5.11)
RDW: 19.7 % — ABNORMAL HIGH (ref 11.5–15.5)
WBC Count: 3.3 10*3/uL — ABNORMAL LOW (ref 4.0–10.5)
nRBC: 0 % (ref 0.0–0.2)

## 2019-07-27 LAB — BASIC METABOLIC PANEL - CANCER CENTER ONLY
Anion gap: 11 (ref 5–15)
BUN: 16 mg/dL (ref 6–20)
CO2: 22 mmol/L (ref 22–32)
Calcium: 8.6 mg/dL — ABNORMAL LOW (ref 8.9–10.3)
Chloride: 105 mmol/L (ref 98–111)
Creatinine: 0.85 mg/dL (ref 0.44–1.00)
GFR, Est AFR Am: >60 mL/min (ref >60)
GFR, Estimated: >60 mL/min (ref >60)
Glucose, Bld: 95 mg/dL (ref 70–99)
Potassium: 3.3 mmol/L — ABNORMAL LOW (ref 3.5–5.1)
Sodium: 138 mmol/L (ref 135–145)

## 2019-07-30 ENCOUNTER — Ambulatory Visit: Payer: BLUE CROSS/BLUE SHIELD

## 2019-07-30 ENCOUNTER — Other Ambulatory Visit: Payer: Self-pay | Admitting: Radiation Oncology

## 2019-07-30 ENCOUNTER — Ambulatory Visit
Admission: RE | Admit: 2019-07-30 | Discharge: 2019-07-30 | Disposition: A | Discharge status: Home / Self Care | Payer: BLUE CROSS/BLUE SHIELD | Source: Ambulatory Visit | Attending: Radiation Oncology | Admitting: Radiation Oncology

## 2019-07-30 ENCOUNTER — Encounter: Payer: Self-pay | Admitting: Hematology and Oncology

## 2019-07-30 ENCOUNTER — Other Ambulatory Visit: Payer: Self-pay

## 2019-07-30 ENCOUNTER — Inpatient Hospital Stay (HOSPITAL_BASED_OUTPATIENT_CLINIC_OR_DEPARTMENT_OTHER): Payer: BLUE CROSS/BLUE SHIELD | Admitting: Hematology and Oncology

## 2019-07-30 VITALS — BP 109/86 | HR 80 | Temp 98.7°F | Resp 18 | Ht 66.0 in | Wt 225.8 lb

## 2019-07-30 DIAGNOSIS — C538 Malignant neoplasm of overlapping sites of cervix uteri: Secondary | ICD-10-CM | POA: Diagnosis not present

## 2019-07-30 DIAGNOSIS — R197 Diarrhea, unspecified: Secondary | ICD-10-CM

## 2019-07-30 DIAGNOSIS — R109 Unspecified abdominal pain: Secondary | ICD-10-CM

## 2019-07-30 DIAGNOSIS — E876 Hypokalemia: Secondary | ICD-10-CM | POA: Diagnosis not present

## 2019-07-30 MED ORDER — TRAMADOL HCL 50 MG PO TABS: 100.0000 mg | ORAL_TABLET | Freq: Four times a day (QID) | ORAL | 0 refills | Status: DC | PRN

## 2019-07-30 NOTE — Assessment & Plan Note (Signed)
She is not taking scheduled Imodium I recommend her to take Imodium around-the-clock

## 2019-07-30 NOTE — Progress Notes (Signed)
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 [facility]) She continues to have significant side effects from treatment including severe diarrhea, nausea, crampy abdominal pain and tinnitus Plan to reduce on reduced dose of cisplatin  We will proceed with treatment as scheduled  Diarrhea She is not taking scheduled Imodium I recommend her to take Imodium around-the-clock  Abdominal pain She has crampy abdominal pain I recommend trial of tramadol  Hypokalemia due to excessive gastrointestinal loss of potassium Her hypokalemia is likely due to GI loss Recommend Imodium and potassium rich diet   No orders of the defined types were placed in this encounter.   INTERVAL HISTORY: Please see below for problem oriented charting. 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  She was placed on Xarelto and aspirin for following this procedure.  She had already been experiencing some irregular menses, however after commencing anticoagulant therapy her menstrual cycle became even heavier and more irregular with daily bleeding   09/20/2018 Imaging   US pelvis Negative aside from subserosal appearing 2.5 centimeter fibroid at the right fundus.   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 No evidence of thoracic metastasis. 2. Small RIGHT upper lobe pulmonary nodule favored benign.   Abdomen / Pelvis Impression:   1. No direct extension beyond the myometrium of the uterus, lower uterine segment or cervix. 2. No clear evidence metastatic adenopathy in the pelvis. Several subcentimeter iliac lymph nodes are noted. 3. No evidence distant metastatic disease or adenopathy in the abdomen pelvis. 4. Uterus is poorly evaluated by CT. Probable leiomyoma in the anterior wall. 5. No skeletal metastasis.   06/08/2019 Imaging   MRI pelvis 4.4 cm barrel shaped soft tissue mass in the cervix, consistent with known cervical carcinoma. No evidence of parametrial involvement or other pelvic metastatic disease. FIGO stage IB3 by imaging.   Mild hydrometros.   Small uterine fibroids and adenomyosis.   Normal appearance of both ovaries.  No adnexal mass identified.     06/28/2019 Cancer Staging   Staging form: Cervix Uteri, AJCC 8th Edition - Clinical: FIGO Stage IB1 (cT1b1, cN0, cM0) - Signed by Heath Lark, MD on 06/28/2019   06/28/2019 PET scan   1. Intense hypermetabolic lesion within the lower uterine segment/cervix consistent with known cervical carcinoma. No evidence of extension beyond the myometrium. 2. No hypermetabolic pelvic lymphadenopathy or periaortic retroperitoneal adenopathy. 3. No evidence of distant metastatic disease or skeletal disease.  07/05/2019 Procedure   Successful placement of a right internal jugular approach power injectable Port-A-Cath. The catheter is ready for immediate use.       Genetic Testing   Patient has genetic testing done for PD-L1 on 06/01/19 biopsy. Results revealed: PD-L1 0%    07/10/2019 -  Chemotherapy   The patient had cisplatin for chemotherapy treatment.       REVIEW OF SYSTEMS:   Constitutional: Denies fevers, chills or abnormal weight loss Eyes: Denies blurriness of vision Ears, nose, mouth, throat, and face: Denies mucositis or sore throat Respiratory: Denies cough, dyspnea or wheezes Cardiovascular: Denies palpitation, chest discomfort or lower extremity swelling Skin: Denies abnormal skin rashes Lymphatics: Denies new lymphadenopathy or easy bruising Behavioral/Psych: Mood is stable, no new changes  All other systems were reviewed with the patient and are negative.  I have reviewed the past medical history, past surgical history, social history and family history with the patient and they are unchanged from previous note.  ALLERGIES:  has No Known Allergies.  MEDICATIONS:  Current Outpatient Medications  Medication Sig Dispense Refill  . aspirin EC 81 MG EC tablet Take 1 tablet (81 mg total) by mouth daily.    . cyclobenzaprine (FLEXERIL) 10 MG tablet Take 10 mg by mouth daily.     Marland Kitchen dexamethasone (DECADRON) 4 MG tablet Take 1 tablet (4 mg total) by mouth daily. 30 tablet 0  . hydrocortisone 2.5 % lotion Apply topically 2 (two) times daily. (Patient taking differently: Apply 1 application topically 2 (two) times daily. Applied to rash on legs) 118 mL 0  . lidocaine-prilocaine (EMLA) cream Apply to affected area once 30 g 3  . ondansetron (ZOFRAN) 8 MG tablet Take 1 tablet (8 mg total) by mouth every 8 (eight) hours as needed. Start on the third day after chemotherapy. 30 tablet 1  . prochlorperazine (COMPAZINE) 10 MG tablet Take 1 tablet (10 mg total) by mouth every 6 (six) hours as needed (Nausea or vomiting). 30 tablet 1  . traMADol (ULTRAM) 50 MG tablet Take 2 tablets (100 mg total) by mouth every 6 (six) hours as needed. 60  tablet 0  . XARELTO 20 MG TABS tablet Take 1 tablet by mouth once daily 30 tablet 6   No current facility-administered medications for this visit.     PHYSICAL EXAMINATION: ECOG PERFORMANCE STATUS: 1 - Symptomatic but completely ambulatory  Vitals:   07/30/19 1236  BP: 109/86  Pulse: 80  Resp: 18  Temp: 98.7 F (37.1 C)  SpO2: 100%   Filed Weights   07/30/19 1236  Weight: 225 lb 12.8 oz (102.4 kg)    GENERAL:alert, no distress and comfortable SKIN: skin color, texture, turgor are normal, no rashes or significant lesions EYES: normal, Conjunctiva are pink and non-injected, sclera clear OROPHARYNX:no exudate, no erythema and lips, buccal mucosa, and tongue normal  NECK: supple, thyroid normal size, non-tender, without nodularity LYMPH:  no palpable lymphadenopathy in the cervical, axillary or inguinal LUNGS: clear to auscultation and percussion with normal breathing effort HEART: regular rate & rhythm and no murmurs and no lower extremity edema ABDOMEN:abdomen soft, non-tender and normal bowel sounds Musculoskeletal:no cyanosis of digits and no clubbing  NEURO: alert & oriented x 3 with fluent speech, no focal motor/sensory deficits  LABORATORY DATA:  I have reviewed the data as listed    Component Value Date/Time   NA 138 07/27/2019 1104   K 3.3 (L) 07/27/2019 1104   CL 105 07/27/2019 1104   CO2 22  07/27/2019 1104   GLUCOSE 95 07/27/2019 1104   BUN 16 07/27/2019 1104   CREATININE 0.85 07/27/2019 1104   CALCIUM 8.6 (L) 07/27/2019 1104   PROT 7.6 09/20/2018 1326   ALBUMIN 3.5 09/20/2018 1326   AST 19 09/20/2018 1326   ALT 12 09/20/2018 1326   ALKPHOS 66 09/20/2018 1326   BILITOT 0.4 09/20/2018 1326   GFRNONAA >60 07/27/2019 1104   GFRAA >60 07/27/2019 1104    No results found for: SPEP, UPEP  Lab Results  Component Value Date   WBC 3.3 (L) 07/27/2019   NEUTROABS 2.3 07/27/2019   HGB 13.7 07/27/2019   HCT 40.6 07/27/2019   MCV 79.5 (L) 07/27/2019   PLT  189 07/27/2019      Chemistry      Component Value Date/Time   NA 138 07/27/2019 1104   K 3.3 (L) 07/27/2019 1104   CL 105 07/27/2019 1104   CO2 22 07/27/2019 1104   BUN 16 07/27/2019 1104   CREATININE 0.85 07/27/2019 1104      Component Value Date/Time   CALCIUM 8.6 (L) 07/27/2019 1104   ALKPHOS 66 09/20/2018 1326   AST 19 09/20/2018 1326   ALT 12 09/20/2018 1326   BILITOT 0.4 09/20/2018 1326       RADIOGRAPHIC STUDIES: I have personally reviewed the radiological images as listed and agreed with the findings in the report. Ir Imaging Guided Port Insertion  Addendum Date: 07/24/2019   ADDENDUM REPORT: 07/24/2019 08:17 ADDENDUM: Imaging obtained during port insertion demonstrates the internal catheter tip to be located at the SVC/RA junction. Electronically Signed   By: Aletta Edouard M.D.   On: 07/24/2019 08:17   Result Date: 07/24/2019 INDICATION: 48 year old female with cervical cancer. A request for Port-A-Cath placement was made to allow for chemotherapy treatment. EXAM: IMPLANTED PORT A CATH PLACEMENT WITH ULTRASOUND AND FLUOROSCOPIC GUIDANCE MEDICATIONS: Ancef 2 gm IV; The antibiotic was administered within an appropriate time interval prior to skin puncture. ANESTHESIA/SEDATION: Moderate (conscious) sedation was employed during this procedure. A total of Versed 2 mg and Fentanyl 100 mcg was administered intravenously. Moderate Sedation Time: 31 minutes. The patient's level of consciousness and vital signs were monitored continuously by radiology nursing throughout the procedure under my direct supervision. FLUOROSCOPY TIME:  0 minutes, 30 seconds COMPLICATIONS: None immediate. PROCEDURE: The procedure, risks, benefits, and alternatives were explained to the patient. Questions regarding the procedure were encouraged and answered. The patient understands and consents to the procedure. The right neck and chest were prepped with chlorhexidine in a sterile fashion, and a sterile  drape was applied covering the operative field. Maximum barrier sterile technique with sterile gowns and gloves were used for the procedure. A timeout was performed prior to the initiation of the procedure. Local anesthesia was provided with 1% lidocaine with epinephrine. After creating a small venotomy incision, a micropuncture kit was utilized to access the internal jugular vein under direct, real-time ultrasound guidance. Ultrasound image documentation was performed. The microwire was kinked to measure appropriate catheter length. A subcutaneous port pocket was then created along the upper chest wall utilizing a combination of sharp and blunt dissection. The pocket was irrigated with sterile saline. A single lumen ISP power injectable port was chosen for placement. The 8 Fr catheter was tunneled from the port pocket site to the venotomy incision. The port was placed in the pocket. The external catheter was trimmed to appropriate length. At the venotomy, an 8 Fr peel-away sheath was placed over a guidewire under  fluoroscopic guidance. The catheter was then placed through the sheath and the sheath was removed. Final catheter positioning was confirmed and documented with a fluoroscopic spot radiograph. The port was accessed with a Huber needle, aspirated and flushed with heparinized saline. The venotomy site was closed with Dermabond. The port pocket incision was closed with interrupted 3-0 Vicryl suture and the skin was opposed with a running subcuticular 4-0 Vicryl suture. Dermabond was applied to the port pocket incision. Dressings were placed. The patient tolerated the procedure well without immediate post procedural complication. IMPRESSION: Successful placement of a right internal jugular approach power injectable Port-A-Cath. The catheter is ready for immediate use. Electronically Signed: By: Constance Holster M.D. On: 07/05/2019 14:20    All questions were answered. The patient knows to call the clinic  with any problems, questions or concerns. No barriers to learning was detected.  I spent 15 minutes counseling the patient face to face. The total time spent in the appointment was 20 minutes and more than 50% was on counseling and review of test results  Heath Lark, MD 07/30/2019 1:46 PM

## 2019-07-30 NOTE — Assessment & Plan Note (Signed)
Her hypokalemia is likely due to GI loss Recommend Imodium and potassium rich diet

## 2019-07-30 NOTE — Assessment & Plan Note (Signed)
She continues to have significant side effects from treatment including severe diarrhea, nausea, crampy abdominal pain and tinnitus Plan to reduce on reduced dose of cisplatin  We will proceed with treatment as scheduled

## 2019-07-30 NOTE — Assessment & Plan Note (Signed)
She has crampy abdominal pain I recommend trial of tramadol

## 2019-07-31 ENCOUNTER — Inpatient Hospital Stay: Payer: BLUE CROSS/BLUE SHIELD

## 2019-07-31 ENCOUNTER — Ambulatory Visit
Admission: RE | Admit: 2019-07-31 | Discharge: 2019-07-31 | Disposition: A | Discharge status: Home / Self Care | Payer: BLUE CROSS/BLUE SHIELD | Source: Ambulatory Visit | Attending: Radiation Oncology | Admitting: Radiation Oncology

## 2019-07-31 ENCOUNTER — Other Ambulatory Visit: Payer: Self-pay

## 2019-07-31 VITALS — BP 117/91 | HR 87 | Temp 95.6°F | Resp 18

## 2019-07-31 DIAGNOSIS — C538 Malignant neoplasm of overlapping sites of cervix uteri: Secondary | ICD-10-CM

## 2019-07-31 MED ORDER — LOPERAMIDE HCL 2 MG PO CAPS
4.0000 mg | ORAL_CAPSULE | Freq: Once | ORAL | Status: AC
  Administered 2019-07-31: 11:00:00 4 mg via ORAL

## 2019-07-31 MED ORDER — LOPERAMIDE HCL 2 MG PO CAPS
ORAL_CAPSULE | ORAL | Status: AC
  Filled 2019-07-31: qty 2

## 2019-07-31 MED ORDER — SODIUM CHLORIDE 0.9 % IV SOLN
Freq: Once | INTRAVENOUS | Status: AC
  Administered 2019-07-31: 13:00:00 via INTRAVENOUS
  Filled 2019-07-31: qty 5

## 2019-07-31 MED ORDER — POTASSIUM CHLORIDE 2 MEQ/ML IV SOLN
Freq: Once | INTRAVENOUS | Status: AC
  Administered 2019-07-31: 11:00:00 via INTRAVENOUS
  Filled 2019-07-31: qty 10

## 2019-07-31 MED ORDER — SODIUM CHLORIDE 0.9 % IV SOLN
30.0000 mg/m2 | Freq: Once | INTRAVENOUS | Status: AC
  Administered 2019-07-31: 57 mg via INTRAVENOUS
  Filled 2019-07-31: qty 57

## 2019-07-31 MED ORDER — PALONOSETRON HCL INJECTION 0.25 MG/5ML
0.2500 mg | Freq: Once | INTRAVENOUS | Status: AC
  Administered 2019-07-31: 0.25 mg via INTRAVENOUS

## 2019-07-31 MED ORDER — SODIUM CHLORIDE 0.9% FLUSH
10.0000 mL | INTRAVENOUS | Status: DC | PRN
  Administered 2019-07-31: 16:00:00 10 mL
  Filled 2019-07-31: qty 10

## 2019-07-31 MED ORDER — PALONOSETRON HCL INJECTION 0.25 MG/5ML
INTRAVENOUS | Status: AC
  Filled 2019-07-31: qty 5

## 2019-07-31 MED ORDER — SODIUM CHLORIDE 0.9 % IV SOLN
Freq: Once | INTRAVENOUS | Status: AC
  Administered 2019-07-31: 10:00:00 via INTRAVENOUS
  Filled 2019-07-31: qty 250

## 2019-07-31 MED ORDER — HEPARIN SOD (PORK) LOCK FLUSH 100 UNIT/ML IV SOLN
500.0000 [IU] | Freq: Once | INTRAVENOUS | Status: AC | PRN
  Administered 2019-07-31: 500 [IU]
  Filled 2019-07-31: qty 5

## 2019-07-31 NOTE — Patient Instructions (Signed)
[facility] Discharge Instructions for Patients Receiving Chemotherapy  Today you received the following chemotherapy agents Cisplatin  To help prevent nausea and vomiting after your treatment, we encourage you to take your nausea medication as directed  If you develop nausea and vomiting that is not controlled by your nausea medication, call the clinic.   BELOW ARE SYMPTOMS THAT SHOULD BE REPORTED IMMEDIATELY:  *FEVER GREATER THAN 100.5 F  *CHILLS WITH OR WITHOUT FEVER  NAUSEA AND VOMITING THAT IS NOT CONTROLLED WITH YOUR NAUSEA MEDICATION  *UNUSUAL SHORTNESS OF BREATH  *UNUSUAL BRUISING OR BLEEDING  TENDERNESS IN MOUTH AND THROAT WITH OR WITHOUT PRESENCE OF ULCERS  *URINARY PROBLEMS  *BOWEL PROBLEMS  UNUSUAL RASH Items with * indicate a potential emergency and should be followed up as soon as possible.  Feel free to call the clinic should you have any questions or concerns. The clinic phone number is [phone].  Please show the CHEMO ALERT CARD at check-in to the Emergency Department and triage nurse.   

## 2019-08-01 ENCOUNTER — Other Ambulatory Visit: Payer: Self-pay

## 2019-08-01 ENCOUNTER — Ambulatory Visit
Admission: RE | Admit: 2019-08-01 | Discharge: 2019-08-01 | Disposition: A | Discharge status: Home / Self Care | Payer: BLUE CROSS/BLUE SHIELD | Source: Ambulatory Visit | Attending: Radiation Oncology | Admitting: Radiation Oncology

## 2019-08-01 ENCOUNTER — Telehealth: Payer: Self-pay | Admitting: *Deleted

## 2019-08-01 DIAGNOSIS — C538 Malignant neoplasm of overlapping sites of cervix uteri: Secondary | ICD-10-CM

## 2019-08-01 LAB — CBC WITH DIFFERENTIAL (CANCER CENTER ONLY)
Abs Immature Granulocytes: 0.04 10*3/uL (ref 0.00–0.07)
Basophils Absolute: 0 10*3/uL (ref 0.0–0.1)
Basophils Relative: 0 %
Eosinophils Absolute: 0 10*3/uL (ref 0.0–0.5)
Eosinophils Relative: 0 %
HCT: 35.7 % — ABNORMAL LOW (ref 36.0–46.0)
Hemoglobin: 12.2 g/dL (ref 12.0–15.0)
Immature Granulocytes: 1 %
Lymphocytes Relative: 5 %
Lymphs Abs: 0.3 10*3/uL — ABNORMAL LOW (ref 0.7–4.0)
MCH: 26.9 pg (ref 26.0–34.0)
MCHC: 34.2 g/dL (ref 30.0–36.0)
MCV: 78.6 fL — ABNORMAL LOW (ref 80.0–100.0)
Monocytes Absolute: 0.6 10*3/uL (ref 0.1–1.0)
Monocytes Relative: 10 %
Neutro Abs: 5.2 10*3/uL (ref 1.7–7.7)
Neutrophils Relative %: 84 %
Platelet Count: 199 10*3/uL (ref 150–400)
RBC: 4.54 MIL/uL (ref 3.87–5.11)
RDW: 18.5 % — ABNORMAL HIGH (ref 11.5–15.5)
WBC Count: 6.1 10*3/uL (ref 4.0–10.5)
nRBC: 0 % (ref 0.0–0.2)

## 2019-08-01 NOTE — Telephone Encounter (Signed)
CALLED PATIENT TO INFORM OF LAB TODAY AFTER TX., SPOKE WITH PATIENT AND SHE IS AWARE OF THIS APPT.

## 2019-08-02 ENCOUNTER — Other Ambulatory Visit: Payer: Self-pay

## 2019-08-02 ENCOUNTER — Ambulatory Visit
Admission: RE | Admit: 2019-08-02 | Discharge: 2019-08-02 | Disposition: A | Discharge status: Home / Self Care | Payer: BLUE CROSS/BLUE SHIELD | Source: Ambulatory Visit | Attending: Radiation Oncology | Admitting: Radiation Oncology

## 2019-08-02 DIAGNOSIS — C538 Malignant neoplasm of overlapping sites of cervix uteri: Secondary | ICD-10-CM | POA: Diagnosis not present

## 2019-08-03 ENCOUNTER — Ambulatory Visit
Admission: RE | Admit: 2019-08-03 | Discharge: 2019-08-03 | Disposition: A | Discharge status: Home / Self Care | Payer: BLUE CROSS/BLUE SHIELD | Source: Ambulatory Visit | Attending: Radiation Oncology | Admitting: Radiation Oncology

## 2019-08-03 ENCOUNTER — Inpatient Hospital Stay: Payer: BLUE CROSS/BLUE SHIELD

## 2019-08-03 ENCOUNTER — Other Ambulatory Visit: Payer: Self-pay

## 2019-08-03 ENCOUNTER — Telehealth: Payer: Self-pay | Admitting: *Deleted

## 2019-08-03 DIAGNOSIS — C538 Malignant neoplasm of overlapping sites of cervix uteri: Secondary | ICD-10-CM | POA: Diagnosis not present

## 2019-08-03 LAB — CBC WITH DIFFERENTIAL (CANCER CENTER ONLY)
Abs Immature Granulocytes: 0 10*3/uL (ref 0.00–0.07)
Basophils Absolute: 0 10*3/uL (ref 0.0–0.1)
Basophils Relative: 1 %
Eosinophils Absolute: 0.3 10*3/uL (ref 0.0–0.5)
Eosinophils Relative: 8 %
HCT: 35.7 % — ABNORMAL LOW (ref 36.0–46.0)
Hemoglobin: 12.1 g/dL (ref 12.0–15.0)
Immature Granulocytes: 0 %
Lymphocytes Relative: 16 %
Lymphs Abs: 0.5 10*3/uL — ABNORMAL LOW (ref 0.7–4.0)
MCH: 27.1 pg (ref 26.0–34.0)
MCHC: 33.9 g/dL (ref 30.0–36.0)
MCV: 79.9 fL — ABNORMAL LOW (ref 80.0–100.0)
Monocytes Absolute: 0.3 10*3/uL (ref 0.1–1.0)
Monocytes Relative: 9 %
Neutro Abs: 2.1 10*3/uL (ref 1.7–7.7)
Neutrophils Relative %: 66 %
Platelet Count: 194 10*3/uL (ref 150–400)
RBC: 4.47 MIL/uL (ref 3.87–5.11)
RDW: 18.6 % — ABNORMAL HIGH (ref 11.5–15.5)
WBC Count: 3.2 10*3/uL — ABNORMAL LOW (ref 4.0–10.5)
nRBC: 0 % (ref 0.0–0.2)

## 2019-08-03 LAB — BASIC METABOLIC PANEL - CANCER CENTER ONLY
Anion gap: 15 (ref 5–15)
BUN: 14 mg/dL (ref 6–20)
CO2: 21 mmol/L — ABNORMAL LOW (ref 22–32)
Calcium: 8.3 mg/dL — ABNORMAL LOW (ref 8.9–10.3)
Chloride: 106 mmol/L (ref 98–111)
Creatinine: 0.82 mg/dL (ref 0.44–1.00)
GFR, Est AFR Am: >60 mL/min (ref >60)
GFR, Estimated: >60 mL/min (ref >60)
Glucose, Bld: 122 mg/dL — ABNORMAL HIGH (ref 70–99)
Potassium: 3.5 mmol/L (ref 3.5–5.1)
Sodium: 142 mmol/L (ref 135–145)

## 2019-08-03 LAB — MAGNESIUM: Magnesium: 1.5 mg/dL — ABNORMAL LOW (ref 1.7–2.4)

## 2019-08-03 MED ORDER — MAGNESIUM OXIDE 400 (241.3 MG) MG PO TABS: 400.0000 mg | ORAL_TABLET | Freq: Every day | ORAL | 0 refills | Status: DC

## 2019-08-03 NOTE — Telephone Encounter (Signed)
Per Dr. Alvy Bimler called patient with lab results and directions on new medication for magnesium sent to pharmacy. Patient verbalized an understanding

## 2019-08-06 ENCOUNTER — Other Ambulatory Visit: Payer: Self-pay

## 2019-08-06 ENCOUNTER — Encounter: Payer: Self-pay | Admitting: Hematology and Oncology

## 2019-08-06 ENCOUNTER — Ambulatory Visit
Admission: RE | Admit: 2019-08-06 | Discharge: 2019-08-06 | Disposition: A | Discharge status: Home / Self Care | Payer: BLUE CROSS/BLUE SHIELD | Source: Ambulatory Visit | Attending: Radiation Oncology | Admitting: Radiation Oncology

## 2019-08-06 ENCOUNTER — Inpatient Hospital Stay: Payer: BLUE CROSS/BLUE SHIELD | Attending: Gynecologic Oncology | Admitting: Hematology and Oncology

## 2019-08-06 DIAGNOSIS — D252 Subserosal leiomyoma of uterus: Secondary | ICD-10-CM | POA: Insufficient documentation

## 2019-08-06 DIAGNOSIS — D61818 Other pancytopenia: Secondary | ICD-10-CM | POA: Insufficient documentation

## 2019-08-06 DIAGNOSIS — Z510 Encounter for antineoplastic radiation therapy: Secondary | ICD-10-CM | POA: Diagnosis present

## 2019-08-06 DIAGNOSIS — Z86718 Personal history of other venous thrombosis and embolism: Secondary | ICD-10-CM | POA: Diagnosis not present

## 2019-08-06 DIAGNOSIS — Z79899 Other long term (current) drug therapy: Secondary | ICD-10-CM | POA: Insufficient documentation

## 2019-08-06 DIAGNOSIS — R918 Other nonspecific abnormal finding of lung field: Secondary | ICD-10-CM | POA: Diagnosis not present

## 2019-08-06 DIAGNOSIS — T451X5A Adverse effect of antineoplastic and immunosuppressive drugs, initial encounter: Secondary | ICD-10-CM

## 2019-08-06 DIAGNOSIS — Z7982 Long term (current) use of aspirin: Secondary | ICD-10-CM | POA: Diagnosis not present

## 2019-08-06 DIAGNOSIS — R300 Dysuria: Secondary | ICD-10-CM | POA: Insufficient documentation

## 2019-08-06 DIAGNOSIS — D701 Agranulocytosis secondary to cancer chemotherapy: Secondary | ICD-10-CM | POA: Diagnosis not present

## 2019-08-06 DIAGNOSIS — R197 Diarrhea, unspecified: Secondary | ICD-10-CM | POA: Diagnosis not present

## 2019-08-06 DIAGNOSIS — C538 Malignant neoplasm of overlapping sites of cervix uteri: Secondary | ICD-10-CM | POA: Insufficient documentation

## 2019-08-06 DIAGNOSIS — Z5111 Encounter for antineoplastic chemotherapy: Secondary | ICD-10-CM | POA: Insufficient documentation

## 2019-08-06 DIAGNOSIS — Z7901 Long term (current) use of anticoagulants: Secondary | ICD-10-CM | POA: Diagnosis not present

## 2019-08-06 NOTE — Assessment & Plan Note (Signed)
She has minimum diarrhea She will continue Imodium as needed

## 2019-08-06 NOTE — Progress Notes (Signed)
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 [facility]) So far, she tolerated treatment fairly well with expected side effects She has mild pancytopenia for which she is not symptomatic along with slight neuropathy We will proceed with treatment as schedule After she completes her last cycle of treatment, she will continue on radiation therapy The plan will be to repeat imaging study approximately 3 months after completion of radiation treatment  Diarrhea She has minimum diarrhea She will continue Imodium as needed  Hypomagnesemia This is likely due to cisplatin I recommend her to take oral magnesium supplement  Leukopenia due to antineoplastic chemotherapy [facility]) This is likely due to recent treatment. The patient denies recent history of fevers, cough, chills, diarrhea or dysuria. She is asymptomatic from the leukopenia. I will observe for now.  I will continue the chemotherapy at current dose without dosage adjustment.  If the leukopenia gets progressive worse in the future, I might have to delay her treatment or adjust the chemotherapy dose.     No orders of the defined types were placed in this encounter.   INTERVAL HISTORY: Please see below for problem oriented charting. 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  She was placed on  Xarelto and aspirin for following this procedure.  She had already been experiencing some irregular menses, however after commencing anticoagulant therapy her menstrual cycle became even heavier and more irregular with daily bleeding   09/20/2018 Imaging   US pelvis Negative aside from subserosal appearing 2.5 centimeter fibroid at the right fundus.   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 No evidence of thoracic metastasis. 2. Small RIGHT upper lobe pulmonary nodule favored benign.   Abdomen / Pelvis Impression:   1. No direct extension beyond the myometrium of the uterus, lower uterine segment or cervix. 2. No clear evidence metastatic adenopathy in the pelvis. Several subcentimeter iliac lymph nodes are noted. 3. No evidence distant metastatic disease or adenopathy in the abdomen pelvis. 4. Uterus is poorly evaluated by CT. Probable leiomyoma in the anterior wall. 5. No skeletal metastasis.   06/08/2019 Imaging   MRI pelvis 4.4 cm barrel shaped soft tissue mass in the cervix, consistent with known cervical carcinoma. No evidence of parametrial involvement or other pelvic metastatic disease. FIGO stage IB3 by imaging.   Mild hydrometros.   Small uterine fibroids and adenomyosis.   Normal appearance of both ovaries.  No adnexal mass identified.     06/28/2019 Cancer Staging   Staging form: Cervix Uteri, AJCC 8th  Edition - Clinical: FIGO Stage IB1 (cT1b1, cN0, cM0) - Signed by Heath Lark, MD on 06/28/2019   06/28/2019 PET scan   1. Intense hypermetabolic lesion within the lower uterine  segment/cervix consistent with known cervical carcinoma. No evidence of extension beyond the myometrium. 2. No hypermetabolic pelvic lymphadenopathy or periaortic retroperitoneal adenopathy. 3. No evidence of distant metastatic disease or skeletal disease.       07/05/2019 Procedure   Successful placement of a right internal jugular approach power injectable Port-A-Cath. The catheter is ready for immediate use.      Genetic Testing   Patient has genetic testing done for PD-L1 on 06/01/19 biopsy. Results revealed: PD-L1 0%    07/10/2019 -  Chemotherapy   The patient had cisplatin for chemotherapy treatment.       REVIEW OF SYSTEMS:   Constitutional: Denies fevers, chills or abnormal weight loss Eyes: Denies blurriness of vision Ears, nose, mouth, throat, and face: Denies mucositis or sore throat Respiratory: Denies cough, dyspnea or wheezes Cardiovascular: Denies palpitation, chest discomfort or lower extremity swelling Skin: Denies abnormal skin rashes Lymphatics: Denies new lymphadenopathy or easy bruising Neurological:Denies numbness, tingling or new weaknesses Behavioral/Psych: Mood is stable, no new changes  All other systems were reviewed with the patient and are negative.  I have reviewed the past medical history, past surgical history, social history and family history with the patient and they are unchanged from previous note.  ALLERGIES:  has No Known Allergies.  MEDICATIONS:  Current Outpatient Medications  Medication Sig Dispense Refill  . aspirin EC 81 MG EC tablet Take 1 tablet (81 mg total) by mouth daily.    . cyclobenzaprine (FLEXERIL) 10 MG tablet Take 10 mg by mouth daily.     Marland Kitchen dexamethasone (DECADRON) 4 MG tablet Take 1 tablet (4 mg total) by mouth daily. 30 tablet 0  . hydrocortisone 2.5 % lotion Apply topically 2 (two) times daily. (Patient taking differently: Apply 1 application topically 2 (two) times daily. Applied to rash on legs) 118 mL 0  .  lidocaine-prilocaine (EMLA) cream Apply to affected area once 30 g 3  . magnesium oxide (MAG-OX) 400 (241.3 Mg) MG tablet Take 1 tablet (400 mg total) by mouth daily. 30 tablet 0  . ondansetron (ZOFRAN) 8 MG tablet Take 1 tablet (8 mg total) by mouth every 8 (eight) hours as needed. Start on the third day after chemotherapy. 30 tablet 1  . prochlorperazine (COMPAZINE) 10 MG tablet Take 1 tablet (10 mg total) by mouth every 6 (six) hours as needed (Nausea or vomiting). 30 tablet 1  . traMADol (ULTRAM) 50 MG tablet Take 2 tablets (100 mg total) by mouth every 6 (six) hours as needed. 60 tablet 0  . XARELTO 20 MG TABS tablet Take 1 tablet by mouth once daily 30 tablet 6   No current facility-administered medications for this visit.     PHYSICAL EXAMINATION: ECOG PERFORMANCE STATUS: 1 - Symptomatic but completely ambulatory  Vitals:   08/06/19 1142  BP: (!) 152/96  Pulse: 71  Resp: 20  Temp: 98.5 F (36.9 C)  SpO2: 100%   Filed Weights   08/06/19 1142  Weight: 228 lb 6.4 oz (103.6 kg)    GENERAL:alert, no distress and comfortable SKIN: skin color, texture, turgor are normal, no rashes or significant lesions EYES: normal, Conjunctiva are pink and non-injected, sclera clear OROPHARYNX:no exudate, no erythema and lips, buccal mucosa, and tongue normal  NECK: supple, thyroid normal size, non-tender, without nodularity LYMPH:  no  palpable lymphadenopathy in the cervical, axillary or inguinal LUNGS: clear to auscultation and percussion with normal breathing effort HEART: regular rate & rhythm and no murmurs and no lower extremity edema ABDOMEN:abdomen soft, non-tender and normal bowel sounds Musculoskeletal:no cyanosis of digits and no clubbing  NEURO: alert & oriented x 3 with fluent speech, no focal motor/sensory deficits  LABORATORY DATA:  I have reviewed the data as listed    Component Value Date/Time   NA 142 08/03/2019 1115   K 3.5 08/03/2019 1115   CL 106 08/03/2019 1115    CO2 21 (L) 08/03/2019 1115   GLUCOSE 122 (H) 08/03/2019 1115   BUN 14 08/03/2019 1115   CREATININE 0.82 08/03/2019 1115   CALCIUM 8.3 (L) 08/03/2019 1115   PROT 7.6 09/20/2018 1326   ALBUMIN 3.5 09/20/2018 1326   AST 19 09/20/2018 1326   ALT 12 09/20/2018 1326   ALKPHOS 66 09/20/2018 1326   BILITOT 0.4 09/20/2018 1326   GFRNONAA >60 08/03/2019 1115   GFRAA >60 08/03/2019 1115    No results found for: SPEP, UPEP  Lab Results  Component Value Date   WBC 3.2 (L) 08/03/2019   NEUTROABS 2.1 08/03/2019   HGB 12.1 08/03/2019   HCT 35.7 (L) 08/03/2019   MCV 79.9 (L) 08/03/2019   PLT 194 08/03/2019      Chemistry      Component Value Date/Time   NA 142 08/03/2019 1115   K 3.5 08/03/2019 1115   CL 106 08/03/2019 1115   CO2 21 (L) 08/03/2019 1115   BUN 14 08/03/2019 1115   CREATININE 0.82 08/03/2019 1115      Component Value Date/Time   CALCIUM 8.3 (L) 08/03/2019 1115   ALKPHOS 66 09/20/2018 1326   AST 19 09/20/2018 1326   ALT 12 09/20/2018 1326   BILITOT 0.4 09/20/2018 1326      All questions were answered. The patient knows to call the clinic with any problems, questions or concerns. No barriers to learning was detected.  I spent 15 minutes counseling the patient face to face. The total time spent in the appointment was 20 minutes and more than 50% was on counseling and review of test results  Heath Lark, MD 08/06/2019 12:02 PM

## 2019-08-06 NOTE — Assessment & Plan Note (Signed)
This is likely due to recent treatment. The patient denies recent history of fevers, cough, chills, diarrhea or dysuria. She is asymptomatic from the leukopenia. I will observe for now.  I will continue the chemotherapy at current dose without dosage adjustment.  If the leukopenia gets progressive worse in the future, I might have to delay her treatment or adjust the chemotherapy dose.   

## 2019-08-06 NOTE — Assessment & Plan Note (Signed)
So far, she tolerated treatment fairly well with expected side effects She has mild pancytopenia for which she is not symptomatic along with slight neuropathy We will proceed with treatment as schedule After she completes her last cycle of treatment, she will continue on radiation therapy The plan will be to repeat imaging study approximately 3 months after completion of radiation treatment

## 2019-08-06 NOTE — Assessment & Plan Note (Signed)
This is likely due to cisplatin I recommend her to take oral magnesium supplement

## 2019-08-07 ENCOUNTER — Ambulatory Visit
Admission: RE | Admit: 2019-08-07 | Discharge: 2019-08-07 | Disposition: A | Discharge status: Home / Self Care | Payer: BLUE CROSS/BLUE SHIELD | Source: Ambulatory Visit | Attending: Radiation Oncology | Admitting: Radiation Oncology

## 2019-08-07 ENCOUNTER — Inpatient Hospital Stay: Payer: BLUE CROSS/BLUE SHIELD

## 2019-08-07 ENCOUNTER — Other Ambulatory Visit: Payer: Self-pay

## 2019-08-07 VITALS — BP 123/87 | HR 87 | Temp 97.8°F | Resp 19

## 2019-08-07 DIAGNOSIS — C538 Malignant neoplasm of overlapping sites of cervix uteri: Secondary | ICD-10-CM | POA: Diagnosis not present

## 2019-08-07 MED ORDER — HEPARIN SOD (PORK) LOCK FLUSH 100 UNIT/ML IV SOLN
500.0000 [IU] | Freq: Once | INTRAVENOUS | Status: DC | PRN
  Filled 2019-08-07: qty 5

## 2019-08-07 MED ORDER — PALONOSETRON HCL INJECTION 0.25 MG/5ML
INTRAVENOUS | Status: AC
  Filled 2019-08-07: qty 5

## 2019-08-07 MED ORDER — PALONOSETRON HCL INJECTION 0.25 MG/5ML
0.2500 mg | Freq: Once | INTRAVENOUS | Status: AC
  Administered 2019-08-07: 13:00:00 0.25 mg via INTRAVENOUS

## 2019-08-07 MED ORDER — SODIUM CHLORIDE 0.9 % IV SOLN
Freq: Once | INTRAVENOUS | Status: AC
  Administered 2019-08-07: 10:00:00 via INTRAVENOUS
  Filled 2019-08-07: qty 250

## 2019-08-07 MED ORDER — SODIUM CHLORIDE 0.9 % IV SOLN
Freq: Once | INTRAVENOUS | Status: AC
  Administered 2019-08-07: 13:00:00 via INTRAVENOUS
  Filled 2019-08-07: qty 5

## 2019-08-07 MED ORDER — SODIUM CHLORIDE 0.9% FLUSH
10.0000 mL | INTRAVENOUS | Status: DC | PRN
  Filled 2019-08-07: qty 10

## 2019-08-07 MED ORDER — SODIUM CHLORIDE 0.9 % IV SOLN
30.0000 mg/m2 | Freq: Once | INTRAVENOUS | Status: AC
  Administered 2019-08-07: 14:00:00 57 mg via INTRAVENOUS
  Filled 2019-08-07: qty 57

## 2019-08-07 MED ORDER — LOPERAMIDE HCL 2 MG PO CAPS
2.0000 mg | ORAL_CAPSULE | Freq: Once | ORAL | Status: AC
  Administered 2019-08-07: 2 mg via ORAL

## 2019-08-07 MED ORDER — LOPERAMIDE HCL 2 MG PO CAPS
ORAL_CAPSULE | ORAL | Status: AC
  Filled 2019-08-07: qty 1

## 2019-08-07 MED ORDER — POTASSIUM CHLORIDE 2 MEQ/ML IV SOLN
Freq: Once | INTRAVENOUS | Status: AC
  Administered 2019-08-07: 11:00:00 via INTRAVENOUS
  Filled 2019-08-07: qty 10

## 2019-08-07 NOTE — Patient Instructions (Signed)
[facility] Discharge Instructions for Patients Receiving Chemotherapy  Today you received the following chemotherapy agents Cisplatin  To help prevent nausea and vomiting after your treatment, we encourage you to take your nausea medication as directed  If you develop nausea and vomiting that is not controlled by your nausea medication, call the clinic.   BELOW ARE SYMPTOMS THAT SHOULD BE REPORTED IMMEDIATELY:  *FEVER GREATER THAN 100.5 F  *CHILLS WITH OR WITHOUT FEVER  NAUSEA AND VOMITING THAT IS NOT CONTROLLED WITH YOUR NAUSEA MEDICATION  *UNUSUAL SHORTNESS OF BREATH  *UNUSUAL BRUISING OR BLEEDING  TENDERNESS IN MOUTH AND THROAT WITH OR WITHOUT PRESENCE OF ULCERS  *URINARY PROBLEMS  *BOWEL PROBLEMS  UNUSUAL RASH Items with * indicate a potential emergency and should be followed up as soon as possible.  Feel free to call the clinic should you have any questions or concerns. The clinic phone number is [phone].  Please show the CHEMO ALERT CARD at check-in to the Emergency Department and triage nurse.   

## 2019-08-08 ENCOUNTER — Other Ambulatory Visit: Payer: Self-pay

## 2019-08-08 ENCOUNTER — Ambulatory Visit
Admission: RE | Admit: 2019-08-08 | Discharge: 2019-08-08 | Disposition: A | Discharge status: Home / Self Care | Payer: BLUE CROSS/BLUE SHIELD | Source: Ambulatory Visit | Attending: Radiation Oncology | Admitting: Radiation Oncology

## 2019-08-08 DIAGNOSIS — C538 Malignant neoplasm of overlapping sites of cervix uteri: Secondary | ICD-10-CM | POA: Diagnosis not present

## 2019-08-09 ENCOUNTER — Ambulatory Visit
Admission: RE | Admit: 2019-08-09 | Discharge: 2019-08-09 | Disposition: A | Discharge status: Home / Self Care | Payer: BLUE CROSS/BLUE SHIELD | Source: Ambulatory Visit | Attending: Radiation Oncology | Admitting: Radiation Oncology

## 2019-08-09 ENCOUNTER — Other Ambulatory Visit: Payer: Self-pay

## 2019-08-09 DIAGNOSIS — C538 Malignant neoplasm of overlapping sites of cervix uteri: Secondary | ICD-10-CM | POA: Diagnosis not present

## 2019-08-10 ENCOUNTER — Ambulatory Visit
Admission: RE | Admit: 2019-08-10 | Discharge: 2019-08-10 | Disposition: A | Discharge status: Home / Self Care | Payer: BLUE CROSS/BLUE SHIELD | Source: Ambulatory Visit | Attending: Radiation Oncology | Admitting: Radiation Oncology

## 2019-08-10 ENCOUNTER — Encounter: Payer: Self-pay | Admitting: Radiation Oncology

## 2019-08-10 ENCOUNTER — Other Ambulatory Visit: Payer: Self-pay

## 2019-08-10 ENCOUNTER — Inpatient Hospital Stay: Payer: BLUE CROSS/BLUE SHIELD

## 2019-08-10 ENCOUNTER — Other Ambulatory Visit (HOSPITAL_COMMUNITY)
Admission: RE | Admit: 2019-08-10 | Discharge: 2019-08-10 | Disposition: A | Discharge status: Home / Self Care | Payer: BLUE CROSS/BLUE SHIELD | Source: Ambulatory Visit | Attending: Radiation Oncology | Admitting: Radiation Oncology

## 2019-08-10 DIAGNOSIS — C538 Malignant neoplasm of overlapping sites of cervix uteri: Secondary | ICD-10-CM | POA: Diagnosis not present

## 2019-08-10 DIAGNOSIS — Z20828 Contact with and (suspected) exposure to other viral communicable diseases: Secondary | ICD-10-CM | POA: Insufficient documentation

## 2019-08-10 DIAGNOSIS — Z923 Personal history of irradiation: Secondary | ICD-10-CM

## 2019-08-10 DIAGNOSIS — Z01812 Encounter for preprocedural laboratory examination: Secondary | ICD-10-CM | POA: Insufficient documentation

## 2019-08-10 HISTORY — DX: Personal history of irradiation: Z92.3

## 2019-08-10 LAB — BASIC METABOLIC PANEL - CANCER CENTER ONLY
Anion gap: 13 (ref 5–15)
BUN: 16 mg/dL (ref 6–20)
CO2: 24 mmol/L (ref 22–32)
Calcium: 8.6 mg/dL — ABNORMAL LOW (ref 8.9–10.3)
Chloride: 102 mmol/L (ref 98–111)
Creatinine: 0.84 mg/dL (ref 0.44–1.00)
GFR, Est AFR Am: >60 mL/min (ref >60)
GFR, Estimated: >60 mL/min (ref >60)
Glucose, Bld: 107 mg/dL — ABNORMAL HIGH (ref 70–99)
Potassium: 3.5 mmol/L (ref 3.5–5.1)
Sodium: 139 mmol/L (ref 135–145)

## 2019-08-10 LAB — CBC WITH DIFFERENTIAL (CANCER CENTER ONLY)
Abs Immature Granulocytes: 0.02 10*3/uL (ref 0.00–0.07)
Basophils Absolute: 0 10*3/uL (ref 0.0–0.1)
Basophils Relative: 1 %
Eosinophils Absolute: 0.1 10*3/uL (ref 0.0–0.5)
Eosinophils Relative: 4 %
HCT: 38.8 % (ref 36.0–46.0)
Hemoglobin: 13 g/dL (ref 12.0–15.0)
Immature Granulocytes: 1 %
Lymphocytes Relative: 15 %
Lymphs Abs: 0.5 10*3/uL — ABNORMAL LOW (ref 0.7–4.0)
MCH: 27.8 pg (ref 26.0–34.0)
MCHC: 33.5 g/dL (ref 30.0–36.0)
MCV: 83.1 fL (ref 80.0–100.0)
Monocytes Absolute: 0.4 10*3/uL (ref 0.1–1.0)
Monocytes Relative: 11 %
Neutro Abs: 2.3 10*3/uL (ref 1.7–7.7)
Neutrophils Relative %: 68 %
Platelet Count: 180 10*3/uL (ref 150–400)
RBC: 4.67 MIL/uL (ref 3.87–5.11)
RDW: 17.8 % — ABNORMAL HIGH (ref 11.5–15.5)
WBC Count: 3.3 10*3/uL — ABNORMAL LOW (ref 4.0–10.5)
nRBC: 0 % (ref 0.0–0.2)

## 2019-08-10 LAB — MAGNESIUM: Magnesium: 1.5 mg/dL — ABNORMAL LOW (ref 1.7–2.4)

## 2019-08-11 LAB — NOVEL CORONAVIRUS, NAA (HOSP ORDER, SEND-OUT TO REF LAB; TAT 18-24 HRS): SARS-CoV-2, NAA: NOT DETECTED

## 2019-08-13 ENCOUNTER — Inpatient Hospital Stay (HOSPITAL_BASED_OUTPATIENT_CLINIC_OR_DEPARTMENT_OTHER): Payer: BLUE CROSS/BLUE SHIELD | Admitting: Hematology and Oncology

## 2019-08-13 ENCOUNTER — Inpatient Hospital Stay: Payer: BLUE CROSS/BLUE SHIELD

## 2019-08-13 ENCOUNTER — Other Ambulatory Visit: Payer: Self-pay

## 2019-08-13 ENCOUNTER — Encounter (HOSPITAL_BASED_OUTPATIENT_CLINIC_OR_DEPARTMENT_OTHER): Payer: Self-pay | Admitting: *Deleted

## 2019-08-13 VITALS — BP 108/76 | HR 91 | Temp 98.2°F | Resp 18 | Ht 66.0 in | Wt 229.6 lb

## 2019-08-13 DIAGNOSIS — R300 Dysuria: Secondary | ICD-10-CM

## 2019-08-13 DIAGNOSIS — T451X5A Adverse effect of antineoplastic and immunosuppressive drugs, initial encounter: Secondary | ICD-10-CM

## 2019-08-13 DIAGNOSIS — C538 Malignant neoplasm of overlapping sites of cervix uteri: Secondary | ICD-10-CM | POA: Diagnosis not present

## 2019-08-13 DIAGNOSIS — D701 Agranulocytosis secondary to cancer chemotherapy: Secondary | ICD-10-CM | POA: Diagnosis not present

## 2019-08-13 LAB — URINALYSIS, COMPLETE (UACMP) WITH MICROSCOPIC
Bacteria, UA: NONE SEEN
Bilirubin Urine: NEGATIVE
Glucose, UA: NEGATIVE mg/dL
Ketones, ur: NEGATIVE mg/dL
Nitrite: NEGATIVE
Protein, ur: 30 mg/dL — AB
Specific Gravity, Urine: 1.032 — ABNORMAL HIGH (ref 1.005–1.030)
pH: 5 (ref 5.0–8.0)

## 2019-08-13 MED ORDER — MAGNESIUM OXIDE 400 (241.3 MG) MG PO TABS: 400.0000 mg | ORAL_TABLET | Freq: Two times a day (BID) | ORAL | 11 refills | Status: DC

## 2019-08-13 NOTE — Progress Notes (Signed)
Spoke with dr Sondra Come and he spoke with dr Gaylyn Rong gyn oncology and she is ok with patient taking lat dose xarelto today for procedure tomorrow, called patient and made aware last dose of xarelto was to be this am 08-13-2019, patient vocalized understanding

## 2019-08-13 NOTE — Progress Notes (Signed)
Spoke w/ via phone for pre-op interview---Emma Medina needs dos----   Urine poct           Medina results------cbc with dif, bmet, 08-10-2019 chart/epic, ua 08-13-2019 chart/epic, urine culture pending, abdominal aortagram lower extremity 10-26-18 chart/epic, ekg 10-26-18 chart/epic COVID test ------08-10-2019  negative Arrive at -------800 am 08-14-2019 NPO after ------midnight Medications to take morning of surgery -----none Diabetic medication -----n/a Patient Special Instructions ----- Pre-Op special Istructions -----called [facility] and left message patient took xarelto today Patient verbalized understanding of instructions that were given at this phone interview. Patient denies shortness of breath, chest pain, fever, cough a this phone interview.

## 2019-08-13 NOTE — Anesthesia Preprocedure Evaluation (Addendum)
Anesthesia Evaluation  Patient identified by MRN, date of birth, ID band Patient awake    Reviewed: Allergy & Precautions, NPO status , Patient's Chart, lab work & pertinent test results  Airway Mallampati: I  TM Distance: >3 FB Neck ROM: Full    Dental no notable dental hx. (+) Edentulous Upper   Pulmonary former smoker,    Pulmonary exam normal breath sounds clear to auscultation       Cardiovascular hypertension, Normal cardiovascular exam Rhythm:Regular Rate:Normal     Neuro/Psych negative neurological ROS  negative psych ROS   GI/Hepatic   Endo/Other    Renal/GU K+ 3.5 Cr 0.84     Musculoskeletal   Abdominal (+) + obese,   Peds  Hematology Hgb 13.0 Plt 100   Anesthesia Other Findings   Reproductive/Obstetrics                            Anesthesia Physical Anesthesia Plan  ASA: II  Anesthesia Plan: General   Post-op Pain Management:    Induction: Intravenous  PONV Risk Score and Plan: 2 and Treatment may vary due to age or medical condition, Ondansetron and Dexamethasone  Airway Management Planned: LMA  Additional Equipment: None  Intra-op Plan:   Post-operative Plan:   Informed Consent: I have reviewed the patients History and Physical, chart, labs and discussed the procedure including the risks, benefits and alternatives for the proposed anesthesia with the patient or authorized representative who has indicated his/her understanding and acceptance.     Dental advisory given  Plan Discussed with:   Anesthesia Plan Comments: (GA w LMA + 0.3 mcg/kg Dexmedetomidine)       Anesthesia Quick Evaluation

## 2019-08-14 ENCOUNTER — Other Ambulatory Visit (HOSPITAL_COMMUNITY): Payer: Self-pay | Admitting: Radiation Oncology

## 2019-08-14 ENCOUNTER — Ambulatory Visit (HOSPITAL_BASED_OUTPATIENT_CLINIC_OR_DEPARTMENT_OTHER): Payer: BLUE CROSS/BLUE SHIELD | Admitting: Anesthesiology

## 2019-08-14 ENCOUNTER — Ambulatory Visit (HOSPITAL_BASED_OUTPATIENT_CLINIC_OR_DEPARTMENT_OTHER)
Admission: RE | Admit: 2019-08-14 | Discharge: 2019-08-14 | Disposition: A | Discharge status: Other Acute Inpatient Hospital | Payer: BLUE CROSS/BLUE SHIELD | Attending: Radiation Oncology | Admitting: Radiation Oncology

## 2019-08-14 ENCOUNTER — Encounter (HOSPITAL_BASED_OUTPATIENT_CLINIC_OR_DEPARTMENT_OTHER): Payer: Self-pay | Admitting: *Deleted

## 2019-08-14 ENCOUNTER — Ambulatory Visit
Admission: RE | Admit: 2019-08-14 | Discharge: 2019-08-14 | Disposition: A | Discharge status: Home / Self Care | Payer: BLUE CROSS/BLUE SHIELD | Source: Ambulatory Visit | Attending: Radiation Oncology | Admitting: Radiation Oncology

## 2019-08-14 ENCOUNTER — Encounter: Payer: Self-pay | Admitting: Hematology and Oncology

## 2019-08-14 ENCOUNTER — Ambulatory Visit (HOSPITAL_COMMUNITY)
Admission: RE | Admit: 2019-08-14 | Discharge: 2019-08-14 | Disposition: A | Discharge status: Home / Self Care | Payer: BLUE CROSS/BLUE SHIELD | Source: Ambulatory Visit | Attending: Radiation Oncology | Admitting: Radiation Oncology

## 2019-08-14 ENCOUNTER — Encounter (HOSPITAL_BASED_OUTPATIENT_CLINIC_OR_DEPARTMENT_OTHER)
Admission: RE | Disposition: A | Discharge status: Other Acute Inpatient Hospital | Payer: Self-pay | Source: Home / Self Care | Attending: Radiation Oncology

## 2019-08-14 DIAGNOSIS — Z79899 Other long term (current) drug therapy: Secondary | ICD-10-CM | POA: Insufficient documentation

## 2019-08-14 DIAGNOSIS — I10 Essential (primary) hypertension: Secondary | ICD-10-CM | POA: Diagnosis not present

## 2019-08-14 DIAGNOSIS — Z7901 Long term (current) use of anticoagulants: Secondary | ICD-10-CM | POA: Insufficient documentation

## 2019-08-14 DIAGNOSIS — Z8541 Personal history of malignant neoplasm of cervix uteri: Secondary | ICD-10-CM

## 2019-08-14 DIAGNOSIS — Z87891 Personal history of nicotine dependence: Secondary | ICD-10-CM | POA: Diagnosis not present

## 2019-08-14 DIAGNOSIS — C539 Malignant neoplasm of cervix uteri, unspecified: Secondary | ICD-10-CM | POA: Diagnosis not present

## 2019-08-14 DIAGNOSIS — C538 Malignant neoplasm of overlapping sites of cervix uteri: Secondary | ICD-10-CM

## 2019-08-14 DIAGNOSIS — Z6836 Body mass index (BMI) 36.0-36.9, adult: Secondary | ICD-10-CM | POA: Diagnosis not present

## 2019-08-14 DIAGNOSIS — Z7982 Long term (current) use of aspirin: Secondary | ICD-10-CM | POA: Diagnosis not present

## 2019-08-14 DIAGNOSIS — Z86718 Personal history of other venous thrombosis and embolism: Secondary | ICD-10-CM | POA: Insufficient documentation

## 2019-08-14 DIAGNOSIS — E669 Obesity, unspecified: Secondary | ICD-10-CM | POA: Insufficient documentation

## 2019-08-14 HISTORY — PX: OPERATIVE ULTRASOUND: SHX5996

## 2019-08-14 HISTORY — PX: TANDEM RING INSERTION: SHX6199

## 2019-08-14 LAB — URINALYSIS, COMPLETE (UACMP) WITH MICROSCOPIC
Bacteria, UA: NONE SEEN
Bilirubin Urine: NEGATIVE
Glucose, UA: NEGATIVE mg/dL
Ketones, ur: NEGATIVE mg/dL
Leukocytes,Ua: NEGATIVE
Nitrite: NEGATIVE
Protein, ur: NEGATIVE mg/dL
Specific Gravity, Urine: 1.025 (ref 1.005–1.030)
pH: 5 (ref 5.0–8.0)

## 2019-08-14 LAB — URINE CULTURE: Culture: NO GROWTH

## 2019-08-14 LAB — POCT PREGNANCY, URINE: Preg Test, Ur: NEGATIVE

## 2019-08-14 SURGERY — INSERTION, UTERINE TANDEM AND RING OR CYLINDER, FOR BRACHYTHERAPY
Anesthesia: General | Site: Vagina

## 2019-08-14 MED ORDER — HYDROMORPHONE HCL 1 MG/ML IJ SOLN
0.5000 mg | Freq: Once | INTRAMUSCULAR | Status: AC
  Administered 2019-08-14: 0.5 mg via INTRAVENOUS
  Filled 2019-08-14: qty 1

## 2019-08-14 MED ORDER — FENTANYL CITRATE (PF) 100 MCG/2ML IJ SOLN
INTRAMUSCULAR | Status: DC | PRN
  Administered 2019-08-14 (×8): 25 ug via INTRAVENOUS

## 2019-08-14 MED ORDER — OXYCODONE HCL 5 MG/5ML PO SOLN
5.0000 mg | Freq: Once | ORAL | Status: DC | PRN
  Filled 2019-08-14: qty 5

## 2019-08-14 MED ORDER — KETOROLAC TROMETHAMINE 30 MG/ML IJ SOLN
INTRAMUSCULAR | Status: AC
  Filled 2019-08-14: qty 1

## 2019-08-14 MED ORDER — ESTRADIOL 0.1 MG/GM VA CREA
TOPICAL_CREAM | VAGINAL | Status: AC
  Filled 2019-08-14: qty 42.5

## 2019-08-14 MED ORDER — KETOROLAC TROMETHAMINE 30 MG/ML IJ SOLN
30.0000 mg | Freq: Once | INTRAMUSCULAR | Status: AC | PRN
  Administered 2019-08-14: 12:00:00 30 mg via INTRAVENOUS
  Filled 2019-08-14: qty 1

## 2019-08-14 MED ORDER — LIDOCAINE 2% (20 MG/ML) 5 ML SYRINGE
INTRAMUSCULAR | Status: DC | PRN
  Administered 2019-08-14: 60 mg via INTRAVENOUS

## 2019-08-14 MED ORDER — PHENAZOPYRIDINE HCL 200 MG PO TABS: 200.0000 mg | ORAL_TABLET | Freq: Three times a day (TID) | ORAL | 1 refills | Status: DC | PRN

## 2019-08-14 MED ORDER — PHENYLEPHRINE 40 MCG/ML (10ML) SYRINGE FOR IV PUSH (FOR BLOOD PRESSURE SUPPORT)
PREFILLED_SYRINGE | INTRAVENOUS | Status: DC | PRN
  Administered 2019-08-14 (×2): 80 ug via INTRAVENOUS
  Administered 2019-08-14: 40 ug via INTRAVENOUS

## 2019-08-14 MED ORDER — ACETAMINOPHEN 500 MG PO TABS
ORAL_TABLET | ORAL | Status: AC
  Filled 2019-08-14: qty 2

## 2019-08-14 MED ORDER — PROPOFOL 10 MG/ML IV BOLUS
INTRAVENOUS | Status: DC | PRN
  Administered 2019-08-14: 200 mg via INTRAVENOUS

## 2019-08-14 MED ORDER — MIDAZOLAM HCL 5 MG/5ML IJ SOLN
INTRAMUSCULAR | Status: DC | PRN
  Administered 2019-08-14: 2 mg via INTRAVENOUS

## 2019-08-14 MED ORDER — PHENYLEPHRINE 40 MCG/ML (10ML) SYRINGE FOR IV PUSH (FOR BLOOD PRESSURE SUPPORT)
PREFILLED_SYRINGE | INTRAVENOUS | Status: AC
  Filled 2019-08-14: qty 10

## 2019-08-14 MED ORDER — DEXAMETHASONE SODIUM PHOSPHATE 10 MG/ML IJ SOLN
INTRAMUSCULAR | Status: AC
  Filled 2019-08-14: qty 1

## 2019-08-14 MED ORDER — FENTANYL CITRATE (PF) 100 MCG/2ML IJ SOLN
INTRAMUSCULAR | Status: AC
  Filled 2019-08-14: qty 2

## 2019-08-14 MED ORDER — ONDANSETRON HCL 4 MG/2ML IJ SOLN
INTRAMUSCULAR | Status: AC
  Filled 2019-08-14: qty 2

## 2019-08-14 MED ORDER — ONDANSETRON HCL 4 MG/2ML IJ SOLN
4.0000 mg | Freq: Once | INTRAMUSCULAR | Status: DC | PRN
  Filled 2019-08-14: qty 2

## 2019-08-14 MED ORDER — DEXMEDETOMIDINE HCL IN NACL 200 MCG/50ML IV SOLN
INTRAVENOUS | Status: DC | PRN
  Administered 2019-08-14 (×4): 8 ug via INTRAVENOUS

## 2019-08-14 MED ORDER — LACTATED RINGERS IV SOLN
INTRAVENOUS | Status: DC
  Administered 2019-08-14: 13:00:00 via INTRAVENOUS
  Filled 2019-08-14 (×2): qty 250

## 2019-08-14 MED ORDER — 0.9 % SODIUM CHLORIDE (POUR BTL) OPTIME
TOPICAL | Status: DC | PRN
  Administered 2019-08-14: 10:00:00 500 mL

## 2019-08-14 MED ORDER — LACTATED RINGERS IV SOLN
INTRAVENOUS | Status: DC
  Administered 2019-08-14 (×2): via INTRAVENOUS
  Filled 2019-08-14: qty 1000

## 2019-08-14 MED ORDER — PROPOFOL 10 MG/ML IV BOLUS
INTRAVENOUS | Status: AC
  Filled 2019-08-14: qty 20

## 2019-08-14 MED ORDER — ONDANSETRON HCL 4 MG/2ML IJ SOLN
INTRAMUSCULAR | Status: DC | PRN
  Administered 2019-08-14: 4 mg via INTRAVENOUS

## 2019-08-14 MED ORDER — ACETAMINOPHEN 500 MG PO TABS
1000.0000 mg | ORAL_TABLET | Freq: Once | ORAL | Status: AC
  Administered 2019-08-14: 1000 mg via ORAL
  Filled 2019-08-14: qty 2

## 2019-08-14 MED ORDER — MIDAZOLAM HCL 2 MG/2ML IJ SOLN
INTRAMUSCULAR | Status: AC
  Filled 2019-08-14: qty 2

## 2019-08-14 MED ORDER — OXYCODONE HCL 5 MG PO TABS
5.0000 mg | ORAL_TABLET | Freq: Once | ORAL | Status: DC | PRN
  Filled 2019-08-14: qty 1

## 2019-08-14 MED ORDER — HYDROMORPHONE HCL 1 MG/ML IJ SOLN
INTRAMUSCULAR | Status: AC
  Filled 2019-08-14: qty 1

## 2019-08-14 MED ORDER — SODIUM CHLORIDE 0.9 % IR SOLN
Status: DC | PRN
  Administered 2019-08-14: 400 mL via INTRAVESICAL

## 2019-08-14 MED ORDER — LIDOCAINE 2% (20 MG/ML) 5 ML SYRINGE
INTRAMUSCULAR | Status: AC
  Filled 2019-08-14: qty 5

## 2019-08-14 MED ORDER — DEXAMETHASONE SODIUM PHOSPHATE 10 MG/ML IJ SOLN
INTRAMUSCULAR | Status: DC | PRN
  Administered 2019-08-14: 10 mg via INTRAVENOUS

## 2019-08-14 MED ORDER — LORAZEPAM 0.5 MG PO TABS
0.5000 mg | ORAL_TABLET | Freq: Once | ORAL | Status: AC
  Administered 2019-08-14: 0.5 mg via ORAL
  Filled 2019-08-14: qty 1

## 2019-08-14 MED ORDER — HYDROMORPHONE HCL 1 MG/ML IJ SOLN
0.2500 mg | INTRAMUSCULAR | Status: DC | PRN
  Administered 2019-08-14 (×2): 0.5 mg via INTRAVENOUS
  Filled 2019-08-14: qty 0.5

## 2019-08-14 SURGICAL SUPPLY — 20 items
BNDG CONFORM 2 STRL LF (GAUZE/BANDAGES/DRESSINGS) IMPLANT
CONT SPEC 4OZ CLIKSEAL STRL BL (MISCELLANEOUS) ×4 IMPLANT
COVER WAND RF STERILE (DRAPES) ×4 IMPLANT
DILATOR CANAL MILEX (MISCELLANEOUS) IMPLANT
GAUZE 4X4 16PLY RFD (DISPOSABLE) ×4 IMPLANT
GLOVE BIO SURGEON STRL SZ7.5 (GLOVE) ×8 IMPLANT
GOWN STRL REUS W/TWL LRG LVL3 (GOWN DISPOSABLE) ×4 IMPLANT
HOLDER FOLEY CATH W/STRAP (MISCELLANEOUS) ×4 IMPLANT
HOVERMATT SINGLE USE (MISCELLANEOUS) ×4 IMPLANT
IV NS 1000ML (IV SOLUTION) ×2
IV NS 1000ML BAXH (IV SOLUTION) ×2 IMPLANT
IV SET EXTENSION GRAVITY 40 LF (IV SETS) ×4 IMPLANT
KIT TURNOVER CYSTO (KITS) ×4 IMPLANT
PACK VAGINAL MINOR WOMEN LF (CUSTOM PROCEDURE TRAY) ×4 IMPLANT
PACKING VAGINAL (PACKING) IMPLANT
PAD ABD 8X10 STRL (GAUZE/BANDAGES/DRESSINGS) ×4 IMPLANT
PAD OB MATERNITY 4.3X12.25 (PERSONAL CARE ITEMS) IMPLANT
TOWEL OR 17X26 10 PK STRL BLUE (TOWEL DISPOSABLE) ×4 IMPLANT
TRAY FOLEY W/BAG SLVR 14FR (SET/KITS/TRAYS/PACK) ×4 IMPLANT
WATER STERILE IRR 500ML POUR (IV SOLUTION) ×4 IMPLANT

## 2019-08-14 NOTE — H&P (View-Only) (Signed)
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 female who is accompanied by no one due to COVID-19 restrictions. The patient presented to the ED with abnormal uterine bleeding on 09/20/2018. Pelvic ultrasound performed at the time showed a small fibroid. She was seen by Dr. Hulan Fray on 04/26/2019, who performed uterine biopsy. Pathology from the procedure showed invasive moderately differentiated squamous cell carcinoma.  She was referred to Dr. Denman George on 06/01/2019, who ordered staging scans and performed cervical biopsy. Pathology from the biopsy confirmed squamous cell carcinoma with ulceration and granulation tissue.  Chest/abdomen/pelvis CT was performed on 06/05/2019 and showed: no extension beyond the myometrium of the uterus, lower uterine segment, or cervix; no clear evidence of metastatic disease in the pelvis, or of distant metastatic disease or abdominal adenopathy, or of skeletal metastasis.  Pelvis MRI performed on 06/08/2019 revealed: 4.4 cm barrel-shaped soft tissue mass in the cervix, consistent with known cervical carcinoma; no evidence of parametrial involvement or other pelvic metastatic disease; FIGO stage IB3 by imaging.  Per Dr. Serita Grit note on 06/20/2019, the patient is not a good candidate for surgery given her obesity, anticoagulation use, and risk of complicated VTE in recent history with ongoing vascular compromise to the left lower extremity. Dr. Denman George recommends primary chemoradiation with curative intent.  The patient has recently completed her external beam radiation therapy along with radiosensitizing chemotherapy.  The patient now presents for her first brachytherapy procedure.   PREVIOUS RADIATION THERAPY: No   PAST MEDICAL HISTORY:  Past Medical History:  Diagnosis Date  . Cancer (HCC)    cervical  . Hypertension   . Obstructive thrombus 2019   left leg  . Sciatica    back    PAST SURGICAL HISTORY: Past Surgical History:  Procedure Laterality Date  . ABDOMINAL AORTOGRAM N/A 05/18/2018   Procedure: ABDOMINAL AORTOGRAM;  Surgeon: Waynetta Sandy, MD;  Location: [place] [facility];  Service: Cardiovascular;  Laterality: N/A;  . ABDOMINAL AORTOGRAM W/LOWER EXTREMITY Left 10/26/2018   Procedure: ABDOMINAL AORTOGRAM W/LOWER EXTREMITY;  Surgeon: Waynetta Sandy, MD;  Location: [place] [facility];  Service: Cardiovascular;  Laterality: Left;  . IR IMAGING GUIDED PORT INSERTION  07/05/2019  . LOWER EXTREMITY ANGIOGRAPHY Bilateral 05/18/2018   Procedure: LOWER EXTREMITY ANGIOGRAPHY;  Surgeon: Waynetta Sandy, MD;  Location: [place] [facility];  Service: Cardiovascular;  Laterality: Bilateral;  . THROMBECTOMY FEMORAL ARTERY Left 05/18/2018   Procedure: LEFT LEG THROMBECTOMY, BALLOON ANGIOPLASTY LEFT POSTERIOR TIBIAL ARTERY;  Surgeon: Waynetta Sandy, MD;  Location: [place];  Service: Vascular;  Laterality: Left;  . TUBAL LIGATION      FAMILY HISTORY:  Family History  Problem Relation Age of Onset  . Cancer Mother        brain and uterine  . Stroke Brother   . Diabetes Brother   . Breast cancer Neg Hx     SOCIAL HISTORY:  Social History   Tobacco Use  . Smoking status: Former Smoker    Packs/day: 1.00    Years: 15.00    Pack years: 15.00    Quit date: 07/02/2016    Years since quitting: 3.1  . Smokeless tobacco: Never Used  Substance Use Topics  . Alcohol use: Yes  Comment: rare  . Drug use: Yes    Frequency: 7.0 times per week    Types: Marijuana    Comment: smokes blunt on occasion last blunt 07-12-2019    ALLERGIES: No Known Allergies  MEDICATIONS:  No current facility-administered medications for this encounter.    Current Outpatient  Medications  Medication Sig Dispense Refill  . aspirin EC 81 MG EC tablet Take 1 tablet (81 mg total) by mouth daily.    . cyclobenzaprine (FLEXERIL) 10 MG tablet Take 10 mg by mouth daily.     Marland Kitchen lidocaine-prilocaine (EMLA) cream Apply to affected area once 30 g 3  . magnesium oxide (MAG-OX) 400 (241.3 Mg) MG tablet Take 1 tablet (400 mg total) by mouth 2 (two) times daily. 60 tablet 11  . ondansetron (ZOFRAN) 8 MG tablet Take 1 tablet (8 mg total) by mouth every 8 (eight) hours as needed. Start on the third day after chemotherapy. 30 tablet 1  . prochlorperazine (COMPAZINE) 10 MG tablet Take 1 tablet (10 mg total) by mouth every 6 (six) hours as needed (Nausea or vomiting). 30 tablet 1  . traMADol (ULTRAM) 50 MG tablet Take 2 tablets (100 mg total) by mouth every 6 (six) hours as needed. 60 tablet 0  . XARELTO 20 MG TABS tablet Take 1 tablet by mouth once daily 30 tablet 6    REVIEW OF SYSTEMS:  A 10+ POINT REVIEW OF SYSTEMS WAS OBTAINED including neurology, dermatology, psychiatry, cardiac, respiratory, lymph, extremities, GI, GU, musculoskeletal, constitutional, reproductive, HEENT. She reports occasional vaginal spotting. She denies dysuria or hematuria, rectal bleeding, diarrhea or constipation, abdominal bloating, nausea or vomiting, and any pain.  She reports postcoital bleeding.   PHYSICAL EXAM:  height is 5\' 6"  (1.676 m) and weight is 229 lb (103.9 kg).   General: Alert and oriented, in no acute distress HEENT: Head is normocephalic. Extraocular movements are intact. Oropharynx is clear. Neck: Neck is supple, no palpable cervical or supraclavicular lymphadenopathy. Heart: Regular in rate and rhythm with no murmurs, rubs, or gallops. Chest: Clear to auscultation bilaterally, with no rhonchi, wheezes, or rales. Abdomen: Soft, nontender, nondistended, with no rigidity or guarding. Extremities: No cyanosis or edema.  Scars noted along the left lower extremity from her  vascular surgery  Lymphatics: see Neck Exam Skin: No concerning lesions. Musculoskeletal: symmetric strength and muscle tone throughout. Neurologic: Cranial nerves II through XII are grossly intact. No obvious focalities. Speech is fluent. Coordination is intact. Psychiatric: Judgment and insight are intact. Affect is appropriate. On pelvic examination the external genitalia were unremarkable. A speculum exam was performed. The  cervix is replaced with a 3.5 cm friable cervical mass without parametrial extension.  Rectovaginal exam confirms.  rectal sphincter tone normal.   ECOG = 1  0 - Asymptomatic (Fully active, able to carry on all predisease activities without restriction)  1 - Symptomatic but completely ambulatory (Restricted in physically strenuous activity but ambulatory and able to carry out work of a light or sedentary nature. For example, light housework, office work)  2 - Symptomatic, <50% in bed during the day (Ambulatory and capable of all self care but unable to carry out any work activities. Up and about more than 50% of waking hours)  3 - Symptomatic, >50% in bed, but not bedbound (Capable of only limited self-care, confined to bed or chair 50% or more of waking hours)  4 - Bedbound (Completely disabled. Cannot carry on any self-care. Totally confined to bed or chair)  5 -  Death   Eustace Pen MM, Creech RH, Tormey DC, et al. [phone]). "Toxicity and response criteria of the [facility]". [place] Oncol. 5 (6): 649-55  LABORATORY DATA:  Lab Results  Component Value Date   WBC 3.3 (L) 08/10/2019   HGB 13.0 08/10/2019   HCT 38.8 08/10/2019   MCV 83.1 08/10/2019   PLT 180 08/10/2019   NEUTROABS 2.3 08/10/2019   Lab Results  Component Value Date   NA 139 08/10/2019   K 3.5 08/10/2019   CL 102 08/10/2019   CO2 24 08/10/2019   GLUCOSE 107 (H) 08/10/2019   CREATININE 0.84 08/10/2019   CALCIUM 8.6 (L) 08/10/2019      RADIOGRAPHY: No results found.     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  The procedure was discussed in detail with the patient and she agrees with proceeding with this  treatment.  PLAN: The patient will be taken to the operating room on November 10 at 10 AM for exam under anesthesia and placement of tandem ring in preparation for high-dose-rate radiation therapy.  Patient is scheduled to have 5 brachytherapy treatments which will complete her definitive course of radiation therapy.  Iridium 192 will be the high-dose-rate source.   ------------------------------------------------  Blair Promise, PhD, MD

## 2019-08-14 NOTE — Op Note (Signed)
08/14/2019  11:17 AM  PATIENT:  Emma Medina  48 y.o. female  PRE-OPERATIVE DIAGNOSIS:  CERVICAL CANCER  POST-OPERATIVE DIAGNOSIS:  CERVICAL CANCER  PROCEDURE:  Procedure(s): TANDEM RING INSERTION - FIRST (N/A) OPERATIVE ULTRASOUND (N/A)  SURGEON:  Surgeon(s) and Role:    * Gery Pray, MD - Primary  PHYSICIAN ASSISTANT:   ASSISTANTS: none   ANESTHESIA:   general  EBL:  2 mL   BLOOD ADMINISTERED:none  DRAINS: Urinary Catheter (Foley)   LOCAL MEDICATIONS USED:  NONE  SPECIMEN:  No Specimen  DISPOSITION OF SPECIMEN:  N/A  COUNTS:  YES  TOURNIQUET:  * No tourniquets in log *  DICTATION: The patient was taken to the outpatient OR #1.  Timeout was performed for the procedure,  preoperative antibiotics and preoperative medications.  Patient was prepped and draped in the usual sterile fashion and placed in the dorsolithotomy position.  Under sterile conditions a Foley catheter was placed.  The patient has been complaining of some dysuria and therefore a cath urinalysis culture and sensitivity will be performed on catheterized urine .  Exam under anesthesia revealed that the cervical mass was approximately 2.5 x 2.5 cm.  There is no parametrial extension noted.  Patient proceeded to undergo dilation and sounding of the uterus.  The uterus was anteverted and measured approximately 8.5 cm on sounding.  Patient did have approximately 250 cc of sterile water placed in the bladder for imaging purposes.  Excellent ultrasound imaging was obtained during the procedure.  After dilation of the cervical os the patient had a 60 mm 30 degree tandem placed within the endometrial canal.  Placement was verified with axial and sagittal images.  Prior to placement of the tandem the patient had a 60 mm cervical sleeve placed within the endocervical and endometrial canal.  Patient then had a 30 degree ring with a small shielding cap placed at the region of the cervix and attached to the tandem.   Patient then had placement of a rectal paddle to displace the rectum out of the high-dose radiation field.  Patient tolerated procedure well.  After recovery in PACU the patient will then be taken to the radiation oncology department for planning and her first high-dose-rate treatment.  The patient is scheduled to receive 5 brachytherapy procedures to complete her definitive course of radiation therapy.   PLAN OF CARE: Transferred to radiation oncology for planning and treatment  PATIENT DISPOSITION:  PACU - hemodynamically stable.   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

## 2019-08-14 NOTE — Progress Notes (Signed)
Transported pt from [facility] OP PACU to CT Sim suite. Pt awake and aware of surroundings. Pt's daughter [employer] employee) briefly visited with pt in PACU. Foley catheter patent and draining clear light yellow urine. PIV intact with IVF infusing without difficulty. Loma Sousa, RN BSN

## 2019-08-14 NOTE — Discharge Instructions (Signed)

## 2019-08-14 NOTE — Transfer of Care (Signed)
Immediate Anesthesia Transfer of Care Note  Patient: Emma Medina  Procedure(s) Performed: TANDEM RING INSERTION - FIRST (N/A Vagina ) OPERATIVE ULTRASOUND (N/A )  Patient Location: PACU  Anesthesia Type:General  Level of Consciousness: drowsy and patient cooperative  Airway & Oxygen Therapy: Patient Spontanous Breathing and Patient connected to face mask oxygen  Post-op Assessment: Report given to RN and Post -op Vital signs reviewed and stable  Post vital signs: Reviewed and stable  Last Vitals:  Vitals Value Taken Time  BP 123/95 08/14/19 1052  Temp    Pulse 87 08/14/19 1053  Resp 20 08/14/19 1053  SpO2 100 % 08/14/19 1053  Vitals shown include unvalidated device data.  Last Pain:  Vitals:   08/14/19 0835  TempSrc: Oral  PainSc: 0-No pain         Complications: No apparent anesthesia complications

## 2019-08-14 NOTE — H&P (View-Only) (Signed)
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 female who is accompanied by no one due to COVID-19 restrictions. The patient presented to the ED with abnormal uterine bleeding on 09/20/2018. Pelvic ultrasound performed at the time showed a small fibroid. She was seen by Dr. Hulan Fray on 04/26/2019, who performed uterine biopsy. Pathology from the procedure showed invasive moderately differentiated squamous cell carcinoma.  She was referred to Dr. Denman George on 06/01/2019, who ordered staging scans and performed cervical biopsy. Pathology from the biopsy confirmed squamous cell carcinoma with ulceration and granulation tissue.  Chest/abdomen/pelvis CT was performed on 06/05/2019 and showed: no extension beyond the myometrium of the uterus, lower uterine segment, or cervix; no clear evidence of metastatic disease in the pelvis, or of distant metastatic disease or abdominal adenopathy, or of skeletal metastasis.  Pelvis MRI performed on 06/08/2019 revealed: 4.4 cm barrel-shaped soft tissue mass in the cervix, consistent with known cervical carcinoma; no evidence of parametrial involvement or other pelvic metastatic disease; FIGO stage IB3 by imaging.  Per Dr. Serita Grit note on 06/20/2019, the patient is not a good candidate for surgery given her obesity, anticoagulation use, and risk of complicated VTE in recent history with ongoing vascular compromise to the left lower extremity. Dr. Denman George recommends primary chemoradiation with curative intent.  The patient has recently completed her external beam radiation therapy along with radiosensitizing chemotherapy.  The patient now presents for her first brachytherapy procedure.   PREVIOUS RADIATION THERAPY: No   PAST MEDICAL HISTORY:  Past Medical History:  Diagnosis Date  . Cancer (HCC)    cervical  . Hypertension   . Obstructive thrombus 2019   left leg  . Sciatica    back    PAST SURGICAL HISTORY: Past Surgical History:  Procedure Laterality Date  . ABDOMINAL AORTOGRAM N/A 05/18/2018   Procedure: ABDOMINAL AORTOGRAM;  Surgeon: Waynetta Sandy, MD;  Location: [facility];  Service: Cardiovascular;  Laterality: N/A;  . ABDOMINAL AORTOGRAM W/LOWER EXTREMITY Left 10/26/2018   Procedure: ABDOMINAL AORTOGRAM W/LOWER EXTREMITY;  Surgeon: Waynetta Sandy, MD;  Location: [facility];  Service: Cardiovascular;  Laterality: Left;  . IR IMAGING GUIDED PORT INSERTION  07/05/2019  . LOWER EXTREMITY ANGIOGRAPHY Bilateral 05/18/2018   Procedure: LOWER EXTREMITY ANGIOGRAPHY;  Surgeon: Waynetta Sandy, MD;  Location: [facility];  Service: Cardiovascular;  Laterality: Bilateral;  . THROMBECTOMY FEMORAL ARTERY Left 05/18/2018   Procedure: LEFT LEG THROMBECTOMY, BALLOON ANGIOPLASTY LEFT POSTERIOR TIBIAL ARTERY;  Surgeon: Waynetta Sandy, MD;  Location: [facility];  Service: Vascular;  Laterality: Left;  . TUBAL LIGATION      FAMILY HISTORY:  Family History  Problem Relation Age of Onset  . Cancer Mother        brain and uterine  . Stroke Brother   . Diabetes Brother   . Breast cancer Neg Hx     SOCIAL HISTORY:  Social History   Tobacco Use  . Smoking status: Former Smoker    Packs/day: 1.00    Years: 15.00    Pack years: 15.00    Quit date: 07/02/2016    Years since quitting: 3.1  . Smokeless tobacco: Never Used  Substance Use Topics  . Alcohol use: Yes  Comment: rare  . Drug use: Yes    Frequency: 7.0 times per week    Types: Marijuana    Comment: smokes blunt on occasion last blunt 07-12-2019    ALLERGIES: No Known Allergies  MEDICATIONS:  No current facility-administered medications for this encounter.    Current Outpatient  Medications  Medication Sig Dispense Refill  . aspirin EC 81 MG EC tablet Take 1 tablet (81 mg total) by mouth daily.    . cyclobenzaprine (FLEXERIL) 10 MG tablet Take 10 mg by mouth daily.     Marland Kitchen lidocaine-prilocaine (EMLA) cream Apply to affected area once 30 g 3  . magnesium oxide (MAG-OX) 400 (241.3 Mg) MG tablet Take 1 tablet (400 mg total) by mouth 2 (two) times daily. 60 tablet 11  . ondansetron (ZOFRAN) 8 MG tablet Take 1 tablet (8 mg total) by mouth every 8 (eight) hours as needed. Start on the third day after chemotherapy. 30 tablet 1  . prochlorperazine (COMPAZINE) 10 MG tablet Take 1 tablet (10 mg total) by mouth every 6 (six) hours as needed (Nausea or vomiting). 30 tablet 1  . traMADol (ULTRAM) 50 MG tablet Take 2 tablets (100 mg total) by mouth every 6 (six) hours as needed. 60 tablet 0  . XARELTO 20 MG TABS tablet Take 1 tablet by mouth once daily 30 tablet 6    REVIEW OF SYSTEMS:  A 10+ POINT REVIEW OF SYSTEMS WAS OBTAINED including neurology, dermatology, psychiatry, cardiac, respiratory, lymph, extremities, GI, GU, musculoskeletal, constitutional, reproductive, HEENT. She reports occasional vaginal spotting. She denies dysuria or hematuria, rectal bleeding, diarrhea or constipation, abdominal bloating, nausea or vomiting, and any pain.  She reports postcoital bleeding.   PHYSICAL EXAM:  height is 5\' 6"  (1.676 m) and weight is 229 lb (103.9 kg).   General: Alert and oriented, in no acute distress HEENT: Head is normocephalic. Extraocular movements are intact. Oropharynx is clear. Neck: Neck is supple, no palpable cervical or supraclavicular lymphadenopathy. Heart: Regular in rate and rhythm with no murmurs, rubs, or gallops. Chest: Clear to auscultation bilaterally, with no rhonchi, wheezes, or rales. Abdomen: Soft, nontender, nondistended, with no rigidity or guarding. Extremities: No cyanosis or edema.  Scars noted along the left lower extremity from her  vascular surgery  Lymphatics: see Neck Exam Skin: No concerning lesions. Musculoskeletal: symmetric strength and muscle tone throughout. Neurologic: Cranial nerves II through XII are grossly intact. No obvious focalities. Speech is fluent. Coordination is intact. Psychiatric: Judgment and insight are intact. Affect is appropriate. On pelvic examination the external genitalia were unremarkable. A speculum exam was performed. The  cervix is replaced with a 3.5 cm friable cervical mass without parametrial extension.  Rectovaginal exam confirms.  rectal sphincter tone normal.   ECOG = 1  0 - Asymptomatic (Fully active, able to carry on all predisease activities without restriction)  1 - Symptomatic but completely ambulatory (Restricted in physically strenuous activity but ambulatory and able to carry out work of a light or sedentary nature. For example, light housework, office work)  2 - Symptomatic, <50% in bed during the day (Ambulatory and capable of all self care but unable to carry out any work activities. Up and about more than 50% of waking hours)  3 - Symptomatic, >50% in bed, but not bedbound (Capable of only limited self-care, confined to bed or chair 50% or more of waking hours)  4 - Bedbound (Completely disabled. Cannot carry on any self-care. Totally confined to bed or chair)  5 -  Death   Eustace Pen MM, Creech RH, Tormey DC, et al. [phone]). "Toxicity and response criteria of the [facility]". [place] Oncol. 5 (6): 649-55  LABORATORY DATA:  Lab Results  Component Value Date   WBC 3.3 (L) 08/10/2019   HGB 13.0 08/10/2019   HCT 38.8 08/10/2019   MCV 83.1 08/10/2019   PLT 180 08/10/2019   NEUTROABS 2.3 08/10/2019   Lab Results  Component Value Date   NA 139 08/10/2019   K 3.5 08/10/2019   CL 102 08/10/2019   CO2 24 08/10/2019   GLUCOSE 107 (H) 08/10/2019   CREATININE 0.84 08/10/2019   CALCIUM 8.6 (L) 08/10/2019      RADIOGRAPHY: No results found.     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  The procedure was discussed in detail with the patient and she agrees with proceeding with this  treatment.  PLAN: The patient will be taken to the operating room on November 10 at 10 AM for exam under anesthesia and placement of tandem ring in preparation for high-dose-rate radiation therapy.  Patient is scheduled to have 5 brachytherapy treatments which will complete her definitive course of radiation therapy.  Iridium 192 will be the high-dose-rate source.   ------------------------------------------------  Blair Promise, PhD, MD

## 2019-08-14 NOTE — Assessment & Plan Note (Signed)
She is not symptomatic Observe only for now 

## 2019-08-14 NOTE — Interval H&P Note (Signed)
History and Physical Interval Note:  08/14/2019 9:52 AM  Emma Medina  has presented today for surgery, with the diagnosis of CERVICAL CANCER.  The various methods of treatment have been discussed with the patient and family. After consideration of risks, benefits and other options for treatment, the patient has consented to  Procedure(s): TANDEM RING INSERTION (N/A) OPERATIVE ULTRASOUND (N/A) as a surgical intervention.  The patient's history has been reviewed, patient examined, no change in status, stable for surgery.  I have reviewed the patient's chart and labs.  Questions were answered to the patient's satisfaction.     Gery Pray

## 2019-08-14 NOTE — Progress Notes (Signed)
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 Urine culture    Standing Status:   Future    Number of Occurrences:   1    Standing Expiration Date:   09/16/2020  . Urinalysis, Complete w Microscopic    Standing Status:   Future    Number of Occurrences:   1    Standing Expiration Date:   08/12/2020    INTERVAL HISTORY: Please see below for problem oriented charting. She returns for follow-up She completed her last cycle of chemotherapy last week She complained of urinary frequency and dysuria Denies vaginal bleeding/hematuria No recent infection, fever or chills  SUMMARY OF ONCOLOGIC HISTORY: Oncology History Overview Note  PD L1 - 0%   Malignant neoplasm of overlapping sites of cervix ([place])  05/22/2018 Initial Diagnosis   The patient history began in August 2019 when she was diagnosed with a complex left lower extremity DVT that required femoral artery thrombectomy and placement of a stent in the  left femoral artery.  She was placed on Xarelto and aspirin for following this procedure.  She had already been experiencing some irregular menses, however after commencing anticoagulant therapy her menstrual cycle became even heavier and more irregular with daily bleeding   09/20/2018 Imaging   US pelvis Negative aside from subserosal appearing 2.5 centimeter fibroid at the right fundus.   04/26/2019 Pathology Results   Endometrium, biopsy - INVASIVE MODERATELY DIFFERENTIATED SQUAMOUS CELL CARCINOMA   06/01/2019 Initial Diagnosis   Malignant neoplasm of overlapping sites of cervix ([place])   06/01/2019 Pathology Results   Cervix, biopsy, ectocervix 6 o'clock posterior - SQUAMOUS CELL CARCINOMA WITH ULCERATION AND GRANULATION TISSUE   06/05/2019 Imaging   Ct scan of chest, abdomen and pelvis Chest Impression:   1. No evidence of thoracic metastasis. 2. Small RIGHT upper lobe pulmonary nodule favored benign.   Abdomen / Pelvis Impression:   1. No direct extension beyond the myometrium of the uterus, lower uterine segment or cervix. 2. No clear evidence metastatic adenopathy in the pelvis. Several subcentimeter iliac lymph nodes are noted. 3. No evidence distant metastatic disease or adenopathy in the abdomen pelvis. 4. Uterus is poorly evaluated by CT. Probable leiomyoma in the anterior wall. 5. No skeletal metastasis.   06/08/2019 Imaging   MRI pelvis 4.4 cm barrel shaped soft tissue mass in the cervix, consistent with known cervical carcinoma. No evidence of parametrial involvement or other pelvic metastatic disease. FIGO stage IB3 by imaging.   Mild hydrometros.   Small uterine fibroids and adenomyosis.   Normal appearance  of both ovaries.  No adnexal mass identified.     06/28/2019 Cancer Staging   Staging form: Cervix Uteri, AJCC 8th Edition - Clinical: FIGO Stage IB1 (cT1b1, cN0, cM0) - Signed by Heath Lark, MD on 06/28/2019   06/28/2019 PET scan   1. Intense hypermetabolic  lesion within the lower uterine segment/cervix consistent with known cervical carcinoma. No evidence of extension beyond the myometrium. 2. No hypermetabolic pelvic lymphadenopathy or periaortic retroperitoneal adenopathy. 3. No evidence of distant metastatic disease or skeletal disease.       07/05/2019 Procedure   Successful placement of a right internal jugular approach power injectable Port-A-Cath. The catheter is ready for immediate use.      Genetic Testing   Patient has genetic testing done for PD-L1 on 06/01/19 biopsy. Results revealed: PD-L1 0%    07/10/2019 -  Chemotherapy   The patient had cisplatin for chemotherapy treatment.       REVIEW OF SYSTEMS:   Constitutional: Denies fevers, chills or abnormal weight loss Eyes: Denies blurriness of vision Ears, nose, mouth, throat, and face: Denies mucositis or sore throat Respiratory: Denies cough, dyspnea or wheezes Cardiovascular: Denies palpitation, chest discomfort or lower extremity swelling Gastrointestinal:  Denies nausea, heartburn or change in bowel habits Skin: Denies abnormal skin rashes Lymphatics: Denies new lymphadenopathy or easy bruising Neurological:Denies numbness, tingling or new weaknesses Behavioral/Psych: Mood is stable, no new changes  All other systems were reviewed with the patient and are negative.  I have reviewed the past medical history, past surgical history, social history and family history with the patient and they are unchanged from previous note.  ALLERGIES:  has No Known Allergies.  MEDICATIONS:  No current facility-administered medications for this visit.    No current outpatient medications on file.   Facility-Administered Medications Ordered in Other Visits  Medication Dose Route Frequency Provider Last Rate Last Dose  . acetaminophen (TYLENOL) tablet 1,000 mg  1,000 mg Oral Once Barnet Glasgow, MD      . lactated ringers infusion   Intravenous Continuous Barnet Glasgow, MD         PHYSICAL EXAMINATION: ECOG PERFORMANCE STATUS: 1 - Symptomatic but completely ambulatory  Vitals:   08/13/19 1215  BP: 108/76  Pulse: 91  Resp: 18  Temp: 98.2 F (36.8 C)  SpO2: 100%   Filed Weights   08/13/19 1215  Weight: 229 lb 9.6 oz (104.1 kg)    GENERAL:alert, no distress and comfortable SKIN: skin color, texture, turgor are normal, no rashes or significant lesions EYES: normal, Conjunctiva are pink and non-injected, sclera clear OROPHARYNX:no exudate, no erythema and lips, buccal mucosa, and tongue normal  NECK: supple, thyroid normal size, non-tender, without nodularity LYMPH:  no palpable lymphadenopathy in the cervical, axillary or inguinal LUNGS: clear to auscultation and percussion with normal breathing effort HEART: regular rate & rhythm and no murmurs and no lower extremity edema ABDOMEN:abdomen soft, non-tender and normal bowel sounds Musculoskeletal:no cyanosis of digits and no clubbing  NEURO: alert & oriented x 3 with fluent speech, no focal motor/sensory deficits  LABORATORY DATA:  I have reviewed the data as listed    Component Value Date/Time   NA 139 08/10/2019 1114   K 3.5 08/10/2019 1114   CL 102 08/10/2019 1114   CO2 24 08/10/2019 1114   GLUCOSE 107 (H) 08/10/2019 1114   BUN 16 08/10/2019 1114   CREATININE 0.84 08/10/2019 1114   CALCIUM 8.6 (L) 08/10/2019 1114   PROT 7.6 09/20/2018 1326  ALBUMIN 3.5 09/20/2018 1326   AST 19 09/20/2018 1326   ALT 12 09/20/2018 1326   ALKPHOS 66 09/20/2018 1326   BILITOT 0.4 09/20/2018 1326   GFRNONAA >60 08/10/2019 1114   GFRAA >60 08/10/2019 1114    No results found for: SPEP, UPEP  Lab Results  Component Value Date   WBC 3.3 (L) 08/10/2019   NEUTROABS 2.3 08/10/2019   HGB 13.0 08/10/2019   HCT 38.8 08/10/2019   MCV 83.1 08/10/2019   PLT 180 08/10/2019      Chemistry      Component Value Date/Time   NA 139 08/10/2019 1114   K 3.5 08/10/2019 1114   CL 102 08/10/2019 1114   CO2 24  08/10/2019 1114   BUN 16 08/10/2019 1114   CREATININE 0.84 08/10/2019 1114      Component Value Date/Time   CALCIUM 8.6 (L) 08/10/2019 1114   ALKPHOS 66 09/20/2018 1326   AST 19 09/20/2018 1326   ALT 12 09/20/2018 1326   BILITOT 0.4 09/20/2018 1326     All questions were answered. The patient knows to call the clinic with any problems, questions or concerns. No barriers to learning was detected.  I spent 15 minutes counseling the patient face to face. The total time spent in the appointment was 20 minutes and more than 50% was on counseling and review of test results  Heath Lark, MD 08/14/2019 8:24 AM

## 2019-08-14 NOTE — Assessment & Plan Note (Signed)
She has completed recent chemo sensitizing cisplatin for 5 cycles She is recovering well from recent side effects of chemotherapy She will continue weekly blood draw and the rest of her radiation treatment Once she is finished with radiation treatment, she will take a break for approximately 3 months before we repeat imaging study I will call her next week with test results

## 2019-08-14 NOTE — Anesthesia Postprocedure Evaluation (Signed)
Anesthesia Post Note  Patient: Emma Medina  Procedure(s) Performed: TANDEM RING INSERTION - FIRST (N/A Vagina ) OPERATIVE ULTRASOUND (N/A )     Patient location during evaluation: PACU Anesthesia Type: General Level of consciousness: awake and alert Pain management: pain level controlled Vital Signs Assessment: post-procedure vital signs reviewed and stable Respiratory status: spontaneous breathing, nonlabored ventilation, respiratory function stable and patient connected to nasal cannula oxygen Cardiovascular status: blood pressure returned to baseline and stable Postop Assessment: no apparent nausea or vomiting Anesthetic complications: no    Last Vitals:  Vitals:   08/14/19 1145 08/14/19 1200  BP: (!) 133/91   Pulse: 80 82  Resp: (!) 23 20  Temp:    SpO2: 93% 94%    Last Pain:  Vitals:   08/14/19 1200  TempSrc:   PainSc: 5                  Barnet Glasgow

## 2019-08-14 NOTE — H&P (Signed)
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 female who is accompanied by no one due to COVID-19 restrictions. The patient presented to the ED with abnormal uterine bleeding on 09/20/2018. Pelvic ultrasound performed at the time showed a small fibroid. She was seen by Dr. Hulan Fray on 04/26/2019, who performed uterine biopsy. Pathology from the procedure showed invasive moderately differentiated squamous cell carcinoma.  She was referred to Dr. Denman George on 06/01/2019, who ordered staging scans and performed cervical biopsy. Pathology from the biopsy confirmed squamous cell carcinoma with ulceration and granulation tissue.  Chest/abdomen/pelvis CT was performed on 06/05/2019 and showed: no extension beyond the myometrium of the uterus, lower uterine segment, or cervix; no clear evidence of metastatic disease in the pelvis, or of distant metastatic disease or abdominal adenopathy, or of skeletal metastasis.  Pelvis MRI performed on 06/08/2019 revealed: 4.4 cm barrel-shaped soft tissue mass in the cervix, consistent with known cervical carcinoma; no evidence of parametrial involvement or other pelvic metastatic disease; FIGO stage IB3 by imaging.  Per Dr. Serita Grit note on 06/20/2019, the patient is not a good candidate for surgery given her obesity, anticoagulation use, and risk of complicated VTE in recent history with ongoing vascular compromise to the left lower extremity. Dr. Denman George recommends primary chemoradiation with curative intent.  The patient has recently completed her external beam radiation therapy along with radiosensitizing chemotherapy.  The patient now presents for her first brachytherapy procedure.   PREVIOUS RADIATION THERAPY: No   PAST MEDICAL HISTORY:  Past Medical History:  Diagnosis Date  . Cancer (HCC)    cervical  . Hypertension   . Obstructive thrombus 2019   left leg  . Sciatica    back    PAST SURGICAL HISTORY: Past Surgical History:  Procedure Laterality Date  . ABDOMINAL AORTOGRAM N/A 05/18/2018   Procedure: ABDOMINAL AORTOGRAM;  Surgeon: Waynetta Sandy, MD;  Location: [facility];  Service: Cardiovascular;  Laterality: N/A;  . ABDOMINAL AORTOGRAM W/LOWER EXTREMITY Left 10/26/2018   Procedure: ABDOMINAL AORTOGRAM W/LOWER EXTREMITY;  Surgeon: Waynetta Sandy, MD;  Location: [facility];  Service: Cardiovascular;  Laterality: Left;  . IR IMAGING GUIDED PORT INSERTION  07/05/2019  . LOWER EXTREMITY ANGIOGRAPHY Bilateral 05/18/2018   Procedure: LOWER EXTREMITY ANGIOGRAPHY;  Surgeon: Waynetta Sandy, MD;  Location: [facility];  Service: Cardiovascular;  Laterality: Bilateral;  . THROMBECTOMY FEMORAL ARTERY Left 05/18/2018   Procedure: LEFT LEG THROMBECTOMY, BALLOON ANGIOPLASTY LEFT POSTERIOR TIBIAL ARTERY;  Surgeon: Waynetta Sandy, MD;  Location: [facility];  Service: Vascular;  Laterality: Left;  . TUBAL LIGATION      FAMILY HISTORY:  Family History  Problem Relation Age of Onset  . Cancer Mother        brain and uterine  . Stroke Brother   . Diabetes Brother   . Breast cancer Neg Hx     SOCIAL HISTORY:  Social History   Tobacco Use  . Smoking status: Former Smoker    Packs/day: 1.00    Years: 15.00    Pack years: 15.00    Quit date: 07/02/2016    Years since quitting: 3.1  . Smokeless tobacco: Never Used  Substance Use Topics  . Alcohol use: Yes  Comment: rare  . Drug use: Yes    Frequency: 7.0 times per week    Types: Marijuana    Comment: smokes blunt on occasion last blunt 07-12-2019    ALLERGIES: No Known Allergies  MEDICATIONS:  No current facility-administered medications for this encounter.    Current Outpatient  Medications  Medication Sig Dispense Refill  . aspirin EC 81 MG EC tablet Take 1 tablet (81 mg total) by mouth daily.    . cyclobenzaprine (FLEXERIL) 10 MG tablet Take 10 mg by mouth daily.     Marland Kitchen lidocaine-prilocaine (EMLA) cream Apply to affected area once 30 g 3  . magnesium oxide (MAG-OX) 400 (241.3 Mg) MG tablet Take 1 tablet (400 mg total) by mouth 2 (two) times daily. 60 tablet 11  . ondansetron (ZOFRAN) 8 MG tablet Take 1 tablet (8 mg total) by mouth every 8 (eight) hours as needed. Start on the third day after chemotherapy. 30 tablet 1  . prochlorperazine (COMPAZINE) 10 MG tablet Take 1 tablet (10 mg total) by mouth every 6 (six) hours as needed (Nausea or vomiting). 30 tablet 1  . traMADol (ULTRAM) 50 MG tablet Take 2 tablets (100 mg total) by mouth every 6 (six) hours as needed. 60 tablet 0  . XARELTO 20 MG TABS tablet Take 1 tablet by mouth once daily 30 tablet 6    REVIEW OF SYSTEMS:  A 10+ POINT REVIEW OF SYSTEMS WAS OBTAINED including neurology, dermatology, psychiatry, cardiac, respiratory, lymph, extremities, GI, GU, musculoskeletal, constitutional, reproductive, HEENT. She reports occasional vaginal spotting. She denies dysuria or hematuria, rectal bleeding, diarrhea or constipation, abdominal bloating, nausea or vomiting, and any pain.  She reports postcoital bleeding.   PHYSICAL EXAM:  height is 5\' 6"  (1.676 m) and weight is 229 lb (103.9 kg).   General: Alert and oriented, in no acute distress HEENT: Head is normocephalic. Extraocular movements are intact. Oropharynx is clear. Neck: Neck is supple, no palpable cervical or supraclavicular lymphadenopathy. Heart: Regular in rate and rhythm with no murmurs, rubs, or gallops. Chest: Clear to auscultation bilaterally, with no rhonchi, wheezes, or rales. Abdomen: Soft, nontender, nondistended, with no rigidity or guarding. Extremities: No cyanosis or edema.  Scars noted along the left lower extremity from her  vascular surgery  Lymphatics: see Neck Exam Skin: No concerning lesions. Musculoskeletal: symmetric strength and muscle tone throughout. Neurologic: Cranial nerves II through XII are grossly intact. No obvious focalities. Speech is fluent. Coordination is intact. Psychiatric: Judgment and insight are intact. Affect is appropriate. On pelvic examination the external genitalia were unremarkable. A speculum exam was performed. The  cervix is replaced with a 3.5 cm friable cervical mass without parametrial extension.  Rectovaginal exam confirms.  rectal sphincter tone normal.   ECOG = 1  0 - Asymptomatic (Fully active, able to carry on all predisease activities without restriction)  1 - Symptomatic but completely ambulatory (Restricted in physically strenuous activity but ambulatory and able to carry out work of a light or sedentary nature. For example, light housework, office work)  2 - Symptomatic, <50% in bed during the day (Ambulatory and capable of all self care but unable to carry out any work activities. Up and about more than 50% of waking hours)  3 - Symptomatic, >50% in bed, but not bedbound (Capable of only limited self-care, confined to bed or chair 50% or more of waking hours)  4 - Bedbound (Completely disabled. Cannot carry on any self-care. Totally confined to bed or chair)  5 -  Death   Eustace Pen MM, Creech RH, Tormey DC, et al. [phone]). "Toxicity and response criteria of the [facility]". [place] Oncol. 5 (6): 649-55  LABORATORY DATA:  Lab Results  Component Value Date   WBC 3.3 (L) 08/10/2019   HGB 13.0 08/10/2019   HCT 38.8 08/10/2019   MCV 83.1 08/10/2019   PLT 180 08/10/2019   NEUTROABS 2.3 08/10/2019   Lab Results  Component Value Date   NA 139 08/10/2019   K 3.5 08/10/2019   CL 102 08/10/2019   CO2 24 08/10/2019   GLUCOSE 107 (H) 08/10/2019   CREATININE 0.84 08/10/2019   CALCIUM 8.6 (L) 08/10/2019      RADIOGRAPHY: No results found.     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  The procedure was discussed in detail with the patient and she agrees with proceeding with this  treatment.  PLAN: The patient will be taken to the operating room on November 10 at 10 AM for exam under anesthesia and placement of tandem ring in preparation for high-dose-rate radiation therapy.  Patient is scheduled to have 5 brachytherapy treatments which will complete her definitive course of radiation therapy.  Iridium 192 will be the high-dose-rate source.   ------------------------------------------------  Blair Promise, PhD, MD

## 2019-08-14 NOTE — Anesthesia Procedure Notes (Signed)
Procedure Name: LMA Insertion Date/Time: 08/14/2019 10:01 AM Performed by: Genelle Bal, CRNA Pre-anesthesia Checklist: Patient identified, Emergency Drugs available, Suction available and Patient being monitored Patient Re-evaluated:Patient Re-evaluated prior to induction Oxygen Delivery Method: Circle system utilized Preoxygenation: Pre-oxygenation with 100% oxygen Induction Type: IV induction Ventilation: Mask ventilation without difficulty LMA: LMA inserted LMA Size: 4.0 Number of attempts: 1 Airway Equipment and Method: Bite block Placement Confirmation: positive ETCO2 Tube secured with: Tape Dental Injury: Teeth and Oropharynx as per pre-operative assessment

## 2019-08-14 NOTE — Progress Notes (Signed)
  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 Identity was confirmed. All relevant records and images related to the planned course of therapy were reviewed. The patient freely provided informed written consent to proceed with treatment after reviewing the details related to the planned course of therapy. The consent form was witnessed and verified by the simulation staff. Then, the patient was set-up in a stable reproducible supine position for radiation therapy. The tandem ring system was accessed and fiducial markers were placed within the tandem and ring.   Simple treatment device note: On the operating room the patient had construction of her custom tandem ring system. She will be treated with a 30 tandem/ring system. The patient had placement of a 60 mm tandem. A cervical ring with a small shielding was used for her treatment. A rectal paddle was also part of her custom set up device.  Verification simulation note: An AP and lateral film was obtained through the pelvis area. This was compared to the patient's planning films documenting accurate position of the tandem/ring system for treatment.  High-dose-rate brachytherapy treatment note:  The remote afterloading device was accessed through catheter system and attached to the tandem ring system. Patient then proceeded to undergo her first high-dose-rate treatment directed at the cervix. The patient was prescribed a dose of 5.5 gray to be delivered to the [place].Marland Kitchen Patient was treated with 2 channels using 28 dwell positions. Treatment time was 415.6 seconds. The patient tolerated the procedure well. After completion of her therapy, a radiation survey  was performed documenting return of the iridium source into the [employer] safe. The patient was then transferred to the nursing suite. She then had removal of the rectal paddle followed by the tandem and ring system. The patient tolerated the removal well with minimal bleeding.  PLAN: The patient will return next week for her second high-dose-rate treatment. ________________________________  Blair Promise, PhD, MD

## 2019-08-14 NOTE — Progress Notes (Signed)
  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  Identity was confirmed.  All relevant records and images related to the planned course of therapy were reviewed.  The patient freely provided informed written consent to proceed with treatment after reviewing the details related to the planned course of therapy. The consent form was witnessed and verified by the simulation staff.  Then, the patient was set-up in a stable reproducible  supine position for radiation therapy.  CT images were obtained.  Surface markings were placed.  The CT images were loaded into the planning software.  Then the target and avoidance structures were contoured.  Treatment planning then occurred.  The radiation prescription was entered and confirmed.   I have requested : Brachytherapy Isodose Plan and Dosimetry Calculations to plan the radiation distribution.    PLAN:  The patient will receive 5.5 Gy in 1 fraction directed at the high risk clinical target volume.  Iridium 192 will be the high-dose-rate source.    ________________________________  Blair Promise, PhD, MD

## 2019-08-14 NOTE — Assessment & Plan Note (Signed)
She has persistent hypomagnesemia She will continue oral magnesium replacement therapy

## 2019-08-14 NOTE — H&P (View-Only) (Signed)
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 female who is accompanied by no one due to COVID-19 restrictions. The patient presented to the ED with abnormal uterine bleeding on 09/20/2018. Pelvic ultrasound performed at the time showed a small fibroid. She was seen by Dr. Hulan Fray on 04/26/2019, who performed uterine biopsy. Pathology from the procedure showed invasive moderately differentiated squamous cell carcinoma.  She was referred to Dr. Denman George on 06/01/2019, who ordered staging scans and performed cervical biopsy. Pathology from the biopsy confirmed squamous cell carcinoma with ulceration and granulation tissue.  Chest/abdomen/pelvis CT was performed on 06/05/2019 and showed: no extension beyond the myometrium of the uterus, lower uterine segment, or cervix; no clear evidence of metastatic disease in the pelvis, or of distant metastatic disease or abdominal adenopathy, or of skeletal metastasis.  Pelvis MRI performed on 06/08/2019 revealed: 4.4 cm barrel-shaped soft tissue mass in the cervix, consistent with known cervical carcinoma; no evidence of parametrial involvement or other pelvic metastatic disease; FIGO stage IB3 by imaging.  Per Dr. Serita Grit note on 06/20/2019, the patient is not a good candidate for surgery given her obesity, anticoagulation use, and risk of complicated VTE in recent history with ongoing vascular compromise to the left lower extremity. Dr. Denman George recommends primary chemoradiation with curative intent.  The patient has recently completed her external beam radiation therapy along with radiosensitizing chemotherapy.  The patient now presents for her first brachytherapy procedure.   PREVIOUS RADIATION THERAPY: No   PAST MEDICAL HISTORY:  Past Medical History:  Diagnosis Date  . Cancer (HCC)    cervical  . Hypertension   . Obstructive thrombus 2019   left leg  . Sciatica    back    PAST SURGICAL HISTORY: Past Surgical History:  Procedure Laterality Date  . ABDOMINAL AORTOGRAM N/A 05/18/2018   Procedure: ABDOMINAL AORTOGRAM;  Surgeon: Waynetta Sandy, MD;  Location: [facility];  Service: Cardiovascular;  Laterality: N/A;  . ABDOMINAL AORTOGRAM W/LOWER EXTREMITY Left 10/26/2018   Procedure: ABDOMINAL AORTOGRAM W/LOWER EXTREMITY;  Surgeon: Waynetta Sandy, MD;  Location: [facility];  Service: Cardiovascular;  Laterality: Left;  . IR IMAGING GUIDED PORT INSERTION  07/05/2019  . LOWER EXTREMITY ANGIOGRAPHY Bilateral 05/18/2018   Procedure: LOWER EXTREMITY ANGIOGRAPHY;  Surgeon: Waynetta Sandy, MD;  Location: [facility];  Service: Cardiovascular;  Laterality: Bilateral;  . THROMBECTOMY FEMORAL ARTERY Left 05/18/2018   Procedure: LEFT LEG THROMBECTOMY, BALLOON ANGIOPLASTY LEFT POSTERIOR TIBIAL ARTERY;  Surgeon: Waynetta Sandy, MD;  Location: [facility];  Service: Vascular;  Laterality: Left;  . TUBAL LIGATION      FAMILY HISTORY:  Family History  Problem Relation Age of Onset  . Cancer Mother        brain and uterine  . Stroke Brother   . Diabetes Brother   . Breast cancer Neg Hx     SOCIAL HISTORY:  Social History   Tobacco Use  . Smoking status: Former Smoker    Packs/day: 1.00    Years: 15.00    Pack years: 15.00    Quit date: 07/02/2016    Years since quitting: 3.1  . Smokeless tobacco: Never Used  Substance Use Topics  . Alcohol use: Yes  Comment: rare  . Drug use: Yes    Frequency: 7.0 times per week    Types: Marijuana    Comment: smokes blunt on occasion last blunt 07-12-2019    ALLERGIES: No Known Allergies  MEDICATIONS:  No current facility-administered medications for this encounter.    Current Outpatient  Medications  Medication Sig Dispense Refill  . aspirin EC 81 MG EC tablet Take 1 tablet (81 mg total) by mouth daily.    . cyclobenzaprine (FLEXERIL) 10 MG tablet Take 10 mg by mouth daily.     Marland Kitchen lidocaine-prilocaine (EMLA) cream Apply to affected area once 30 g 3  . magnesium oxide (MAG-OX) 400 (241.3 Mg) MG tablet Take 1 tablet (400 mg total) by mouth 2 (two) times daily. 60 tablet 11  . ondansetron (ZOFRAN) 8 MG tablet Take 1 tablet (8 mg total) by mouth every 8 (eight) hours as needed. Start on the third day after chemotherapy. 30 tablet 1  . prochlorperazine (COMPAZINE) 10 MG tablet Take 1 tablet (10 mg total) by mouth every 6 (six) hours as needed (Nausea or vomiting). 30 tablet 1  . traMADol (ULTRAM) 50 MG tablet Take 2 tablets (100 mg total) by mouth every 6 (six) hours as needed. 60 tablet 0  . XARELTO 20 MG TABS tablet Take 1 tablet by mouth once daily 30 tablet 6    REVIEW OF SYSTEMS:  A 10+ POINT REVIEW OF SYSTEMS WAS OBTAINED including neurology, dermatology, psychiatry, cardiac, respiratory, lymph, extremities, GI, GU, musculoskeletal, constitutional, reproductive, HEENT. She reports occasional vaginal spotting. She denies dysuria or hematuria, rectal bleeding, diarrhea or constipation, abdominal bloating, nausea or vomiting, and any pain.  She reports postcoital bleeding.   PHYSICAL EXAM:  height is 5\' 6"  (1.676 m) and weight is 229 lb (103.9 kg).   General: Alert and oriented, in no acute distress HEENT: Head is normocephalic. Extraocular movements are intact. Oropharynx is clear. Neck: Neck is supple, no palpable cervical or supraclavicular lymphadenopathy. Heart: Regular in rate and rhythm with no murmurs, rubs, or gallops. Chest: Clear to auscultation bilaterally, with no rhonchi, wheezes, or rales. Abdomen: Soft, nontender, nondistended, with no rigidity or guarding. Extremities: No cyanosis or edema.  Scars noted along the left lower extremity from her  vascular surgery  Lymphatics: see Neck Exam Skin: No concerning lesions. Musculoskeletal: symmetric strength and muscle tone throughout. Neurologic: Cranial nerves II through XII are grossly intact. No obvious focalities. Speech is fluent. Coordination is intact. Psychiatric: Judgment and insight are intact. Affect is appropriate. On pelvic examination the external genitalia were unremarkable. A speculum exam was performed. The  cervix is replaced with a 3.5 cm friable cervical mass without parametrial extension.  Rectovaginal exam confirms.  rectal sphincter tone normal.   ECOG = 1  0 - Asymptomatic (Fully active, able to carry on all predisease activities without restriction)  1 - Symptomatic but completely ambulatory (Restricted in physically strenuous activity but ambulatory and able to carry out work of a light or sedentary nature. For example, light housework, office work)  2 - Symptomatic, <50% in bed during the day (Ambulatory and capable of all self care but unable to carry out any work activities. Up and about more than 50% of waking hours)  3 - Symptomatic, >50% in bed, but not bedbound (Capable of only limited self-care, confined to bed or chair 50% or more of waking hours)  4 - Bedbound (Completely disabled. Cannot carry on any self-care. Totally confined to bed or chair)  5 -  Death   Eustace Pen MM, Creech RH, Tormey DC, et al. [phone]). "Toxicity and response criteria of the [facility]". [place] Oncol. 5 (6): 649-55  LABORATORY DATA:  Lab Results  Component Value Date   WBC 3.3 (L) 08/10/2019   HGB 13.0 08/10/2019   HCT 38.8 08/10/2019   MCV 83.1 08/10/2019   PLT 180 08/10/2019   NEUTROABS 2.3 08/10/2019   Lab Results  Component Value Date   NA 139 08/10/2019   K 3.5 08/10/2019   CL 102 08/10/2019   CO2 24 08/10/2019   GLUCOSE 107 (H) 08/10/2019   CREATININE 0.84 08/10/2019   CALCIUM 8.6 (L) 08/10/2019      RADIOGRAPHY: No results found.     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  The procedure was discussed in detail with the patient and she agrees with proceeding with this  treatment.  PLAN: The patient will be taken to the operating room on November 10 at 10 AM for exam under anesthesia and placement of tandem ring in preparation for high-dose-rate radiation therapy.  Patient is scheduled to have 5 brachytherapy treatments which will complete her definitive course of radiation therapy.  Iridium 192 will be the high-dose-rate source.   ------------------------------------------------  Blair Promise, PhD, MD

## 2019-08-14 NOTE — Assessment & Plan Note (Signed)
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

## 2019-08-14 NOTE — H&P (View-Only) (Signed)
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 female who is accompanied by no one due to COVID-19 restrictions. The patient presented to the ED with abnormal uterine bleeding on 09/20/2018. Pelvic ultrasound performed at the time showed a small fibroid. She was seen by Dr. Hulan Fray on 04/26/2019, who performed uterine biopsy. Pathology from the procedure showed invasive moderately differentiated squamous cell carcinoma.  She was referred to Dr. Denman George on 06/01/2019, who ordered staging scans and performed cervical biopsy. Pathology from the biopsy confirmed squamous cell carcinoma with ulceration and granulation tissue.  Chest/abdomen/pelvis CT was performed on 06/05/2019 and showed: no extension beyond the myometrium of the uterus, lower uterine segment, or cervix; no clear evidence of metastatic disease in the pelvis, or of distant metastatic disease or abdominal adenopathy, or of skeletal metastasis.  Pelvis MRI performed on 06/08/2019 revealed: 4.4 cm barrel-shaped soft tissue mass in the cervix, consistent with known cervical carcinoma; no evidence of parametrial involvement or other pelvic metastatic disease; FIGO stage IB3 by imaging.  Per Dr. Serita Grit note on 06/20/2019, the patient is not a good candidate for surgery given her obesity, anticoagulation use, and risk of complicated VTE in recent history with ongoing vascular compromise to the left lower extremity. Dr. Denman George recommends primary chemoradiation with curative intent.  The patient has recently completed her external beam radiation therapy along with radiosensitizing chemotherapy.  The patient now presents for her first brachytherapy procedure.   PREVIOUS RADIATION THERAPY: No   PAST MEDICAL HISTORY:  Past Medical History:  Diagnosis Date  . Cancer (HCC)    cervical  . Hypertension   . Obstructive thrombus 2019   left leg  . Sciatica    back    PAST SURGICAL HISTORY: Past Surgical History:  Procedure Laterality Date  . ABDOMINAL AORTOGRAM N/A 05/18/2018   Procedure: ABDOMINAL AORTOGRAM;  Surgeon: Waynetta Sandy, MD;  Location: [facility];  Service: Cardiovascular;  Laterality: N/A;  . ABDOMINAL AORTOGRAM W/LOWER EXTREMITY Left 10/26/2018   Procedure: ABDOMINAL AORTOGRAM W/LOWER EXTREMITY;  Surgeon: Waynetta Sandy, MD;  Location: [facility];  Service: Cardiovascular;  Laterality: Left;  . IR IMAGING GUIDED PORT INSERTION  07/05/2019  . LOWER EXTREMITY ANGIOGRAPHY Bilateral 05/18/2018   Procedure: LOWER EXTREMITY ANGIOGRAPHY;  Surgeon: Waynetta Sandy, MD;  Location: [facility];  Service: Cardiovascular;  Laterality: Bilateral;  . THROMBECTOMY FEMORAL ARTERY Left 05/18/2018   Procedure: LEFT LEG THROMBECTOMY, BALLOON ANGIOPLASTY LEFT POSTERIOR TIBIAL ARTERY;  Surgeon: Waynetta Sandy, MD;  Location: [facility];  Service: Vascular;  Laterality: Left;  . TUBAL LIGATION      FAMILY HISTORY:  Family History  Problem Relation Age of Onset  . Cancer Mother        brain and uterine  . Stroke Brother   . Diabetes Brother   . Breast cancer Neg Hx     SOCIAL HISTORY:  Social History   Tobacco Use  . Smoking status: Former Smoker    Packs/day: 1.00    Years: 15.00    Pack years: 15.00    Quit date: 07/02/2016    Years since quitting: 3.1  . Smokeless tobacco: Never Used  Substance Use Topics  . Alcohol use: Yes  Comment: rare  . Drug use: Yes    Frequency: 7.0 times per week    Types: Marijuana    Comment: smokes blunt on occasion last blunt 07-12-2019    ALLERGIES: No Known Allergies  MEDICATIONS:  No current facility-administered medications for this encounter.    Current Outpatient  Medications  Medication Sig Dispense Refill  . aspirin EC 81 MG EC tablet Take 1 tablet (81 mg total) by mouth daily.    . cyclobenzaprine (FLEXERIL) 10 MG tablet Take 10 mg by mouth daily.     Marland Kitchen lidocaine-prilocaine (EMLA) cream Apply to affected area once 30 g 3  . magnesium oxide (MAG-OX) 400 (241.3 Mg) MG tablet Take 1 tablet (400 mg total) by mouth 2 (two) times daily. 60 tablet 11  . ondansetron (ZOFRAN) 8 MG tablet Take 1 tablet (8 mg total) by mouth every 8 (eight) hours as needed. Start on the third day after chemotherapy. 30 tablet 1  . prochlorperazine (COMPAZINE) 10 MG tablet Take 1 tablet (10 mg total) by mouth every 6 (six) hours as needed (Nausea or vomiting). 30 tablet 1  . traMADol (ULTRAM) 50 MG tablet Take 2 tablets (100 mg total) by mouth every 6 (six) hours as needed. 60 tablet 0  . XARELTO 20 MG TABS tablet Take 1 tablet by mouth once daily 30 tablet 6    REVIEW OF SYSTEMS:  A 10+ POINT REVIEW OF SYSTEMS WAS OBTAINED including neurology, dermatology, psychiatry, cardiac, respiratory, lymph, extremities, GI, GU, musculoskeletal, constitutional, reproductive, HEENT. She reports occasional vaginal spotting. She denies dysuria or hematuria, rectal bleeding, diarrhea or constipation, abdominal bloating, nausea or vomiting, and any pain.  She reports postcoital bleeding.   PHYSICAL EXAM:  height is 5\' 6"  (1.676 m) and weight is 229 lb (103.9 kg).   General: Alert and oriented, in no acute distress HEENT: Head is normocephalic. Extraocular movements are intact. Oropharynx is clear. Neck: Neck is supple, no palpable cervical or supraclavicular lymphadenopathy. Heart: Regular in rate and rhythm with no murmurs, rubs, or gallops. Chest: Clear to auscultation bilaterally, with no rhonchi, wheezes, or rales. Abdomen: Soft, nontender, nondistended, with no rigidity or guarding. Extremities: No cyanosis or edema.  Scars noted along the left lower extremity from her  vascular surgery  Lymphatics: see Neck Exam Skin: No concerning lesions. Musculoskeletal: symmetric strength and muscle tone throughout. Neurologic: Cranial nerves II through XII are grossly intact. No obvious focalities. Speech is fluent. Coordination is intact. Psychiatric: Judgment and insight are intact. Affect is appropriate. On pelvic examination the external genitalia were unremarkable. A speculum exam was performed. The  cervix is replaced with a 3.5 cm friable cervical mass without parametrial extension.  Rectovaginal exam confirms.  rectal sphincter tone normal.   ECOG = 1  0 - Asymptomatic (Fully active, able to carry on all predisease activities without restriction)  1 - Symptomatic but completely ambulatory (Restricted in physically strenuous activity but ambulatory and able to carry out work of a light or sedentary nature. For example, light housework, office work)  2 - Symptomatic, <50% in bed during the day (Ambulatory and capable of all self care but unable to carry out any work activities. Up and about more than 50% of waking hours)  3 - Symptomatic, >50% in bed, but not bedbound (Capable of only limited self-care, confined to bed or chair 50% or more of waking hours)  4 - Bedbound (Completely disabled. Cannot carry on any self-care. Totally confined to bed or chair)  5 -  Death   Eustace Pen MM, Creech RH, Tormey DC, et al. [phone]). "Toxicity and response criteria of the [facility]". [place] Oncol. 5 (6): 649-55  LABORATORY DATA:  Lab Results  Component Value Date   WBC 3.3 (L) 08/10/2019   HGB 13.0 08/10/2019   HCT 38.8 08/10/2019   MCV 83.1 08/10/2019   PLT 180 08/10/2019   NEUTROABS 2.3 08/10/2019   Lab Results  Component Value Date   NA 139 08/10/2019   K 3.5 08/10/2019   CL 102 08/10/2019   CO2 24 08/10/2019   GLUCOSE 107 (H) 08/10/2019   CREATININE 0.84 08/10/2019   CALCIUM 8.6 (L) 08/10/2019      RADIOGRAPHY: No results found.     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  The procedure was discussed in detail with the patient and she agrees with proceeding with this  treatment.  PLAN: The patient will be taken to the operating room on November 10 at 10 AM for exam under anesthesia and placement of tandem ring in preparation for high-dose-rate radiation therapy.  Patient is scheduled to have 5 brachytherapy treatments which will complete her definitive course of radiation therapy.  Iridium 192 will be the high-dose-rate source.   ------------------------------------------------  Blair Promise, PhD, MD

## 2019-08-15 ENCOUNTER — Ambulatory Visit: Payer: BLUE CROSS/BLUE SHIELD | Admitting: Radiation Oncology

## 2019-08-15 ENCOUNTER — Telehealth: Payer: Self-pay

## 2019-08-15 ENCOUNTER — Encounter (HOSPITAL_BASED_OUTPATIENT_CLINIC_OR_DEPARTMENT_OTHER): Payer: Self-pay | Admitting: Radiation Oncology

## 2019-08-15 ENCOUNTER — Other Ambulatory Visit: Payer: Self-pay

## 2019-08-15 LAB — URINE CULTURE: Culture: NO GROWTH

## 2019-08-15 NOTE — Progress Notes (Signed)
Called patient to schedule covid test for 08-20-19 procedure, needs repeat covid test per anesthesia and beverly harrelson, patient refused to repeat covid test, left message with shirley for dr Sondra Come

## 2019-08-15 NOTE — Telephone Encounter (Signed)
Contacted pt to discuss pt's refusal of Covid test pre-op. This RN discussed hospital policy for all pts to have pre-procedure testing. Pt reluctantly agreed to proceed with testing. Pt requests surgery scheduling to contact her to schedule Covid test. This RN relayed message to [place] in pre-op. Loma Sousa, RN BSN

## 2019-08-15 NOTE — Progress Notes (Signed)
Spoke w/ via phone for pre-op interview---[facility] needs dos----              Lab results------ COVID test ------08-16-2019 1030 am (spoke with patient and scheduled covid test) Arrive at -------730 am 08-20-2019 NPO after ------midnight Medications to take morning of surgery -----none Diabetic medication -----n/a Patient Special Instructions ----- Pre-Op special Istructions ----- Patient verbalized understanding of instructions that were given at this phone interview. Patient denies shortness of breath, chest pain, fever, cough a this phone interview.

## 2019-08-16 ENCOUNTER — Other Ambulatory Visit (HOSPITAL_COMMUNITY)
Admission: RE | Admit: 2019-08-16 | Discharge: 2019-08-16 | Disposition: A | Discharge status: Home / Self Care | Payer: BLUE CROSS/BLUE SHIELD | Source: Ambulatory Visit | Attending: Radiation Oncology | Admitting: Radiation Oncology

## 2019-08-16 DIAGNOSIS — Z01812 Encounter for preprocedural laboratory examination: Secondary | ICD-10-CM | POA: Insufficient documentation

## 2019-08-16 DIAGNOSIS — Z20828 Contact with and (suspected) exposure to other viral communicable diseases: Secondary | ICD-10-CM | POA: Insufficient documentation

## 2019-08-17 ENCOUNTER — Telehealth: Payer: Self-pay | Admitting: *Deleted

## 2019-08-17 ENCOUNTER — Telehealth: Payer: Self-pay | Admitting: Hematology and Oncology

## 2019-08-17 ENCOUNTER — Other Ambulatory Visit: Payer: Self-pay

## 2019-08-17 ENCOUNTER — Inpatient Hospital Stay: Payer: BLUE CROSS/BLUE SHIELD

## 2019-08-17 DIAGNOSIS — C538 Malignant neoplasm of overlapping sites of cervix uteri: Secondary | ICD-10-CM | POA: Diagnosis not present

## 2019-08-17 LAB — BASIC METABOLIC PANEL - CANCER CENTER ONLY
Anion gap: 10 (ref 5–15)
BUN: 12 mg/dL (ref 6–20)
CO2: 27 mmol/L (ref 22–32)
Calcium: 8.4 mg/dL — ABNORMAL LOW (ref 8.9–10.3)
Chloride: 106 mmol/L (ref 98–111)
Creatinine: 0.77 mg/dL (ref 0.44–1.00)
GFR, Est AFR Am: >60 mL/min (ref >60)
GFR, Estimated: >60 mL/min (ref >60)
Glucose, Bld: 118 mg/dL — ABNORMAL HIGH (ref 70–99)
Potassium: 4 mmol/L (ref 3.5–5.1)
Sodium: 143 mmol/L (ref 135–145)

## 2019-08-17 LAB — CBC WITH DIFFERENTIAL (CANCER CENTER ONLY)
Abs Immature Granulocytes: 0.02 10*3/uL (ref 0.00–0.07)
Basophils Absolute: 0 10*3/uL (ref 0.0–0.1)
Basophils Relative: 1 %
Eosinophils Absolute: 0 10*3/uL (ref 0.0–0.5)
Eosinophils Relative: 1 %
HCT: 35.7 % — ABNORMAL LOW (ref 36.0–46.0)
Hemoglobin: 11.6 g/dL — ABNORMAL LOW (ref 12.0–15.0)
Immature Granulocytes: 1 %
Lymphocytes Relative: 10 %
Lymphs Abs: 0.4 10*3/uL — ABNORMAL LOW (ref 0.7–4.0)
MCH: 27.8 pg (ref 26.0–34.0)
MCHC: 32.5 g/dL (ref 30.0–36.0)
MCV: 85.4 fL (ref 80.0–100.0)
Monocytes Absolute: 0.3 10*3/uL (ref 0.1–1.0)
Monocytes Relative: 10 %
Neutro Abs: 2.7 10*3/uL (ref 1.7–7.7)
Neutrophils Relative %: 77 %
Platelet Count: 156 10*3/uL (ref 150–400)
RBC: 4.18 MIL/uL (ref 3.87–5.11)
RDW: 17.4 % — ABNORMAL HIGH (ref 11.5–15.5)
WBC Count: 3.4 10*3/uL — ABNORMAL LOW (ref 4.0–10.5)
nRBC: 0 % (ref 0.0–0.2)

## 2019-08-17 LAB — MAGNESIUM: Magnesium: 1.7 mg/dL (ref 1.7–2.4)

## 2019-08-17 NOTE — Telephone Encounter (Signed)
Scheduled appt per 11/13 sch message - pt is aware of appt date and time

## 2019-08-17 NOTE — Telephone Encounter (Signed)
Per Dr. Alvy Bimler, called and made pt aware of lab results being good. Also that her lab appt was cancelled for next week and that she will be seen the week after Christmas to discuss plan. Pt verbalized understanding.

## 2019-08-17 NOTE — Telephone Encounter (Signed)
-----   Message from Heath Lark, MD sent at 08/17/2019 11:57 AM EST ----- Regarding: labs are good Pls call her Labs are good I have cancelled her lab appt next week I sent scheduling msg to see her the week after Christmas to discuss plan

## 2019-08-18 LAB — NOVEL CORONAVIRUS, NAA (HOSP ORDER, SEND-OUT TO REF LAB; TAT 18-24 HRS): SARS-CoV-2, NAA: NOT DETECTED

## 2019-08-20 ENCOUNTER — Ambulatory Visit (HOSPITAL_BASED_OUTPATIENT_CLINIC_OR_DEPARTMENT_OTHER): Payer: BLUE CROSS/BLUE SHIELD | Admitting: Anesthesiology

## 2019-08-20 ENCOUNTER — Other Ambulatory Visit: Payer: Self-pay

## 2019-08-20 ENCOUNTER — Ambulatory Visit: Payer: Self-pay | Admitting: Hematology and Oncology

## 2019-08-20 ENCOUNTER — Encounter (HOSPITAL_BASED_OUTPATIENT_CLINIC_OR_DEPARTMENT_OTHER): Payer: Self-pay

## 2019-08-20 ENCOUNTER — Encounter (HOSPITAL_BASED_OUTPATIENT_CLINIC_OR_DEPARTMENT_OTHER)
Admission: RE | Disposition: A | Discharge status: Other Acute Inpatient Hospital | Payer: Self-pay | Source: Home / Self Care | Attending: Radiation Oncology

## 2019-08-20 ENCOUNTER — Ambulatory Visit
Admission: RE | Admit: 2019-08-20 | Discharge: 2019-08-20 | Disposition: A | Discharge status: Home / Self Care | Payer: BLUE CROSS/BLUE SHIELD | Source: Ambulatory Visit | Attending: Radiation Oncology | Admitting: Radiation Oncology

## 2019-08-20 ENCOUNTER — Ambulatory Visit (HOSPITAL_BASED_OUTPATIENT_CLINIC_OR_DEPARTMENT_OTHER)
Admission: RE | Admit: 2019-08-20 | Discharge: 2019-08-20 | Disposition: A | Discharge status: Other Acute Inpatient Hospital | Payer: BLUE CROSS/BLUE SHIELD | Attending: Radiation Oncology | Admitting: Radiation Oncology

## 2019-08-20 ENCOUNTER — Ambulatory Visit (HOSPITAL_COMMUNITY)
Admission: RE | Admit: 2019-08-20 | Discharge: 2019-08-20 | Disposition: A | Discharge status: Home / Self Care | Payer: BLUE CROSS/BLUE SHIELD | Source: Ambulatory Visit | Attending: Radiation Oncology | Admitting: Radiation Oncology

## 2019-08-20 DIAGNOSIS — Z8541 Personal history of malignant neoplasm of cervix uteri: Secondary | ICD-10-CM

## 2019-08-20 DIAGNOSIS — I739 Peripheral vascular disease, unspecified: Secondary | ICD-10-CM | POA: Diagnosis not present

## 2019-08-20 DIAGNOSIS — Z7901 Long term (current) use of anticoagulants: Secondary | ICD-10-CM | POA: Diagnosis not present

## 2019-08-20 DIAGNOSIS — C539 Malignant neoplasm of cervix uteri, unspecified: Secondary | ICD-10-CM | POA: Insufficient documentation

## 2019-08-20 DIAGNOSIS — Z923 Personal history of irradiation: Secondary | ICD-10-CM | POA: Insufficient documentation

## 2019-08-20 DIAGNOSIS — Z79899 Other long term (current) drug therapy: Secondary | ICD-10-CM | POA: Insufficient documentation

## 2019-08-20 DIAGNOSIS — C538 Malignant neoplasm of overlapping sites of cervix uteri: Secondary | ICD-10-CM

## 2019-08-20 DIAGNOSIS — Z8049 Family history of malignant neoplasm of other genital organs: Secondary | ICD-10-CM | POA: Insufficient documentation

## 2019-08-20 DIAGNOSIS — Z6837 Body mass index (BMI) 37.0-37.9, adult: Secondary | ICD-10-CM | POA: Insufficient documentation

## 2019-08-20 DIAGNOSIS — Z9221 Personal history of antineoplastic chemotherapy: Secondary | ICD-10-CM | POA: Insufficient documentation

## 2019-08-20 DIAGNOSIS — Z86718 Personal history of other venous thrombosis and embolism: Secondary | ICD-10-CM | POA: Diagnosis not present

## 2019-08-20 DIAGNOSIS — Z87891 Personal history of nicotine dependence: Secondary | ICD-10-CM | POA: Diagnosis not present

## 2019-08-20 DIAGNOSIS — I10 Essential (primary) hypertension: Secondary | ICD-10-CM | POA: Diagnosis not present

## 2019-08-20 DIAGNOSIS — Z7982 Long term (current) use of aspirin: Secondary | ICD-10-CM | POA: Diagnosis not present

## 2019-08-20 HISTORY — PX: TANDEM RING INSERTION: SHX6199

## 2019-08-20 HISTORY — PX: OPERATIVE ULTRASOUND: SHX5996

## 2019-08-20 SURGERY — INSERTION, UTERINE TANDEM AND RING OR CYLINDER, FOR BRACHYTHERAPY
Anesthesia: General | Site: Vagina

## 2019-08-20 MED ORDER — DEXAMETHASONE SODIUM PHOSPHATE 10 MG/ML IJ SOLN
INTRAMUSCULAR | Status: AC
  Filled 2019-08-20: qty 1

## 2019-08-20 MED ORDER — PROPOFOL 10 MG/ML IV BOLUS
INTRAVENOUS | Status: AC
  Filled 2019-08-20: qty 40

## 2019-08-20 MED ORDER — FENTANYL CITRATE (PF) 100 MCG/2ML IJ SOLN
INTRAMUSCULAR | Status: AC
  Filled 2019-08-20: qty 2

## 2019-08-20 MED ORDER — LACTATED RINGERS IV SOLN
INTRAVENOUS | Status: DC
  Administered 2019-08-20: 50 mL/h via INTRAVENOUS
  Administered 2019-08-20 (×2): via INTRAVENOUS
  Filled 2019-08-20: qty 1000

## 2019-08-20 MED ORDER — FENTANYL CITRATE (PF) 100 MCG/2ML IJ SOLN
INTRAMUSCULAR | Status: DC | PRN
  Administered 2019-08-20 (×3): 25 ug via INTRAVENOUS
  Administered 2019-08-20: 50 ug via INTRAVENOUS
  Administered 2019-08-20 (×3): 25 ug via INTRAVENOUS

## 2019-08-20 MED ORDER — MIDAZOLAM HCL 2 MG/2ML IJ SOLN
INTRAMUSCULAR | Status: AC
  Filled 2019-08-20: qty 2

## 2019-08-20 MED ORDER — FENTANYL CITRATE (PF) 100 MCG/2ML IJ SOLN
25.0000 ug | INTRAMUSCULAR | Status: DC | PRN
  Administered 2019-08-20: 50 ug via INTRAVENOUS
  Filled 2019-08-20: qty 1

## 2019-08-20 MED ORDER — KETOROLAC TROMETHAMINE 30 MG/ML IJ SOLN
INTRAMUSCULAR | Status: DC | PRN
  Administered 2019-08-20: 30 mg via INTRAVENOUS

## 2019-08-20 MED ORDER — MIDAZOLAM HCL 5 MG/5ML IJ SOLN
INTRAMUSCULAR | Status: DC | PRN
  Administered 2019-08-20: 2 mg via INTRAVENOUS

## 2019-08-20 MED ORDER — OXYCODONE HCL 5 MG PO TABS
5.0000 mg | ORAL_TABLET | Freq: Once | ORAL | Status: DC | PRN
  Filled 2019-08-20: qty 1

## 2019-08-20 MED ORDER — ONDANSETRON HCL 4 MG/2ML IJ SOLN
INTRAMUSCULAR | Status: AC
  Filled 2019-08-20: qty 2

## 2019-08-20 MED ORDER — LIDOCAINE 2% (20 MG/ML) 5 ML SYRINGE
INTRAMUSCULAR | Status: AC
  Filled 2019-08-20: qty 5

## 2019-08-20 MED ORDER — KETOROLAC TROMETHAMINE 30 MG/ML IJ SOLN
INTRAMUSCULAR | Status: AC
  Filled 2019-08-20: qty 1

## 2019-08-20 MED ORDER — SODIUM CHLORIDE 0.9 % IR SOLN
Status: DC | PRN
  Administered 2019-08-20: 1000 mL via INTRAVESICAL

## 2019-08-20 MED ORDER — PROPOFOL 10 MG/ML IV BOLUS
INTRAVENOUS | Status: DC | PRN
  Administered 2019-08-20: 20 mg via INTRAVENOUS
  Administered 2019-08-20: 50 mg via INTRAVENOUS
  Administered 2019-08-20: 200 mg via INTRAVENOUS
  Administered 2019-08-20: 30 mg via INTRAVENOUS

## 2019-08-20 MED ORDER — METOCLOPRAMIDE HCL 5 MG/ML IJ SOLN
10.0000 mg | Freq: Once | INTRAMUSCULAR | Status: DC | PRN
  Filled 2019-08-20: qty 2

## 2019-08-20 MED ORDER — LIDOCAINE HCL (CARDIAC) PF 100 MG/5ML IV SOSY
PREFILLED_SYRINGE | INTRAVENOUS | Status: DC | PRN
  Administered 2019-08-20: 100 mg via INTRAVENOUS

## 2019-08-20 MED ORDER — ONDANSETRON HCL 4 MG/2ML IJ SOLN
INTRAMUSCULAR | Status: DC | PRN
  Administered 2019-08-20: 4 mg via INTRAVENOUS

## 2019-08-20 MED ORDER — HYDROMORPHONE HCL 1 MG/ML IJ SOLN
0.5000 mg | Freq: Once | INTRAMUSCULAR | Status: AC
  Administered 2019-08-20: 0.5 mg via INTRAVENOUS
  Filled 2019-08-20: qty 1

## 2019-08-20 MED ORDER — LACTATED RINGERS IV SOLN
INTRAVENOUS | Status: DC
  Administered 2019-08-20: 13:00:00 via INTRAVENOUS
  Filled 2019-08-20 (×2): qty 250

## 2019-08-20 MED ORDER — DEXAMETHASONE SODIUM PHOSPHATE 10 MG/ML IJ SOLN
INTRAMUSCULAR | Status: DC | PRN
  Administered 2019-08-20: 10 mg via INTRAVENOUS

## 2019-08-20 MED ORDER — OXYCODONE HCL 5 MG/5ML PO SOLN
5.0000 mg | Freq: Once | ORAL | Status: DC | PRN
  Filled 2019-08-20: qty 5

## 2019-08-20 SURGICAL SUPPLY — 19 items
BNDG CONFORM 2 STRL LF (GAUZE/BANDAGES/DRESSINGS) IMPLANT
COVER WAND RF STERILE (DRAPES) ×3 IMPLANT
DILATOR CANAL MILEX (MISCELLANEOUS) IMPLANT
DRSG PAD ABDOMINAL 8X10 ST (GAUZE/BANDAGES/DRESSINGS) ×3 IMPLANT
GAUZE 4X4 16PLY RFD (DISPOSABLE) ×3 IMPLANT
GLOVE BIO SURGEON STRL SZ7.5 (GLOVE) ×6 IMPLANT
GOWN STRL REUS W/TWL LRG LVL3 (GOWN DISPOSABLE) ×3 IMPLANT
HOLDER FOLEY CATH W/STRAP (MISCELLANEOUS) ×3 IMPLANT
HOVERMATT SINGLE USE (MISCELLANEOUS) ×3 IMPLANT
IV NS 1000ML (IV SOLUTION) ×2
IV NS 1000ML BAXH (IV SOLUTION) ×1 IMPLANT
IV SET EXTENSION GRAVITY 40 LF (IV SETS) ×3 IMPLANT
KIT TURNOVER CYSTO (KITS) ×3 IMPLANT
PACK VAGINAL MINOR WOMEN LF (CUSTOM PROCEDURE TRAY) ×3 IMPLANT
PACKING VAGINAL (PACKING) IMPLANT
PAD OB MATERNITY 4.3X12.25 (PERSONAL CARE ITEMS) IMPLANT
TOWEL OR 17X26 10 PK STRL BLUE (TOWEL DISPOSABLE) ×3 IMPLANT
TRAY FOLEY W/BAG SLVR 14FR (SET/KITS/TRAYS/PACK) ×3 IMPLANT
WATER STERILE IRR 500ML POUR (IV SOLUTION) ×3 IMPLANT

## 2019-08-20 NOTE — Anesthesia Postprocedure Evaluation (Signed)
Anesthesia Post Note  Patient: Emma Medina  Procedure(s) Performed: TANDEM RING INSERTION (N/A Vagina ) OPERATIVE ULTRASOUND (N/A Vagina )     Patient location during evaluation: PACU Anesthesia Type: General Level of consciousness: awake and alert and oriented Pain management: pain level controlled Vital Signs Assessment: post-procedure vital signs reviewed and stable Respiratory status: spontaneous breathing, nonlabored ventilation and respiratory function stable Cardiovascular status: blood pressure returned to baseline and stable Postop Assessment: no apparent nausea or vomiting Anesthetic complications: no    Last Vitals:  Vitals:   08/20/19 1016 08/20/19 1030  BP: (!) 134/96 (!) 155/97  Pulse: 70 61  Resp: (!) 24 (!) 21  Temp: 36.6 C   SpO2: 100% 100%    Last Pain:  Vitals:   08/20/19 1030  TempSrc:   PainSc: 7                  Kadon Andrus A.

## 2019-08-20 NOTE — Transfer of Care (Signed)
Immediate Anesthesia Transfer of Care Note  Patient: Emma Medina  Procedure(s) Performed: Procedure(s) (LRB): TANDEM RING INSERTION (N/A) OPERATIVE ULTRASOUND (N/A)  Patient Location: PACU  Anesthesia Type: General  Level of Consciousness: awake, sedated, patient cooperative and responds to stimulation  Airway & Oxygen Therapy: Patient Spontanous Breathing and Patient connected to [place] 02 and soft FM   Post-op Assessment: Report given to PACU RN, Post -op Vital signs reviewed and stable and Patient moving all extremities  Post vital signs: Reviewed and stable  Complications: No apparent anesthesia complications

## 2019-08-20 NOTE — Progress Notes (Addendum)
Pt received from [facility] OP PACU in no apparent distress. Transported without incident to CT simulation suite. Loma Sousa, RN BSN   Pt received from [place] to [place] room 1. VSS. Foley patent and draining clear light yellow urine. IVF infusing without difficulty via PIV. Pt taking PO with difficulty. Loma Sousa, RN BSN   Pt unhappy with lunch tray and contacted [facility] kitchen herself via personal cell phone. Loma Sousa, RN BSN   Pt returned from treatment. Foley removed without difficulty per this RN. PIV removed per this RN, tip intact. Tandem and ring device removed per Dr. Sondra Come, pt tolerated well. Pt assisted with dressing. Pt assisted into personal car driven by son. Pt aware of discharge instructions and able to verbalized concerning s/s that would prompt medical attention. Loma Sousa, RN BSN

## 2019-08-20 NOTE — Anesthesia Procedure Notes (Signed)
Procedure Name: LMA Insertion Date/Time: 08/20/2019 9:36 AM Performed by: Justice Rocher, CRNA Pre-anesthesia Checklist: Patient identified, Emergency Drugs available, Suction available and Patient being monitored Patient Re-evaluated:Patient Re-evaluated prior to induction Oxygen Delivery Method: Circle system utilized Preoxygenation: Pre-oxygenation with 100% oxygen Induction Type: IV induction Ventilation: Mask ventilation without difficulty LMA: LMA inserted LMA Size: 4.0 Number of attempts: 1 Airway Equipment and Method: Bite block Placement Confirmation: positive ETCO2 and breath sounds checked- equal and bilateral Tube secured with: Tape Dental Injury: Teeth and Oropharynx as per pre-operative assessment

## 2019-08-20 NOTE — Discharge Instructions (Signed)

## 2019-08-20 NOTE — Op Note (Signed)
08/20/2019  10:34 AM  PATIENT:  Emma Medina  48 y.o. female  PRE-OPERATIVE DIAGNOSIS:  CERVICAL CANCER  POST-OPERATIVE DIAGNOSIS:  CERVICAL CANCER  PROCEDURE:  Procedure(s): TANDEM RING INSERTION (N/A) OPERATIVE ULTRASOUND (N/A)  SURGEON:  Surgeon(s) and Role:    * Gery Pray, MD - Primary  PHYSICIAN ASSISTANT:   ASSISTANTS: none   ANESTHESIA:   general  EBL:  3 mL   BLOOD ADMINISTERED:none  DRAINS: Urinary Catheter (Foley)   LOCAL MEDICATIONS USED:  NONE  SPECIMEN:  No Specimen  DISPOSITION OF SPECIMEN:  N/A  COUNTS:  YES  TOURNIQUET:  * No tourniquets in log *  DICTATION: The patient was taken to the outpatient OR #1.  Timeout was performed for the procedure,  preoperative antibiotics and preoperative medications.  Patient was prepped and draped in the usual sterile fashion and placed in the dorsolithotomy position.  Under sterile conditions a Foley catheter was placed.  Exam under anesthesia revealed that the cervical mass was approximately 2.5 x 2.5 cm.  There is no parametrial extension noted.  Patient proceeded to undergo dilation and sounding of the uterus.  The uterus was anteverted and measured approximately 8.5 cm on sounding.  Patient did have approximately 250 cc of sterile water placed in the bladder for imaging purposes.  Excellent ultrasound imaging was obtained during the procedure.  After dilation of the cervical os the patient had a 60 mm 30 degree tandem placed within the endometrial canal.  Placement was verified with axial and sagittal images.  Prior to placement of the tandem the patient had a 60 mm cervical sleeve placed within the endocervical and endometrial canal.  Patient then had a 30 degree ring with a small shielding cap placed at the region of the cervix and attached to the tandem.  Patient then had placement of a rectal paddle to displace the rectum out of the high-dose radiation field.  Patient tolerated procedure well.  After recovery in  PACU the patient will then be taken to the radiation oncology department for planning and her second high-dose-rate treatment.  The patient is scheduled to receive 5 brachytherapy procedures to complete her definitive course of radiation therapy.  PLAN OF CARE: Transferred to radiation oncology for planning and treatment  PATIENT DISPOSITION:  PACU - hemodynamically stable.   Delay start of Pharmacological VTE agent (>24hrs) due to surgical blood loss or risk of bleeding: not applicable

## 2019-08-20 NOTE — Progress Notes (Signed)
  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 Identity was confirmed. All relevant records and images related to the planned course of therapy were reviewed. The patient freely provided informed written consent to proceed with treatment after reviewing the details related to the planned course of therapy. The consent form was witnessed and verified by the simulation staff. Then, the patient was set-up in a stable reproducible supine position for radiation therapy. The tandem ring system was accessed and fiducial markers were placed within the tandem and ring.   Simple treatment device note: On the operating room the patient had construction of her custom tandem ring system. She will be treated with a 30 tandem/ring system. The patient had placement of a 60 mm tandem. A cervical ring with a small shielding was used for her treatment. A rectal paddle was also part of her custom set up device.  Verification simulation note: An AP and lateral film was obtained through the pelvis area. This was compared to the patient's planning films documenting accurate position of the tandem/ring system for treatment.  High-dose-rate brachytherapy treatment note:  The remote afterloading device was accessed through catheter system and attached to the tandem ring system. Patient then proceeded to undergo her second high-dose-rate treatment directed at the cervix. The patient was prescribed a dose of 5.5 gray to be delivered to the [place].Marland Kitchen Patient was treated with 2 channels using 28 dwell positions. Treatment time was 487.4 seconds. The patient tolerated the procedure well. After completion of her therapy, a radiation  survey was performed documenting return of the iridium source into the Gamma[employer] safe. The patient was then transferred to the nursing suite. She then had removal of the rectal paddle followed by the tandem and ring system. The patient tolerated the removal well with minimal bleeding.  PLAN: The patient will return next week for her third high-dose-rate treatment.  -----------------------------------  Blair Promise, PhD, MD  This document serves as a record of services personally performed by Gery Pray, MD. It was created on his behalf by Clerance Lav, a trained medical scribe. The creation of this record is based on the scribe's personal observations and the provider's statements to them. This document has been checked and approved by the attending provider.

## 2019-08-20 NOTE — Interval H&P Note (Signed)
History and Physical Interval Note:  08/20/2019 9:25 AM  Emma Medina  has presented today for surgery, with the diagnosis of CERVICAL CANCER.  The various methods of treatment have been discussed with the patient and family. After consideration of risks, benefits and other options for treatment, the patient has consented to  Procedure(s): TANDEM RING INSERTION (N/A) OPERATIVE ULTRASOUND (N/A) as a surgical intervention.  The patient's history has been reviewed, patient examined, no change in status, stable for surgery.  I have reviewed the patient's chart and labs.  Questions were answered to the patient's satisfaction.     Gery Pray

## 2019-08-20 NOTE — Anesthesia Preprocedure Evaluation (Addendum)
Anesthesia Evaluation  Patient identified by MRN, date of birth, ID band Patient awake    Reviewed: Allergy & Precautions, NPO status , Patient's Chart, lab work & pertinent test results, reviewed documented beta blocker date and time   Airway Mallampati: II  TM Distance: >3 FB Neck ROM: Full    Dental  (+) Edentulous Upper   Pulmonary neg pulmonary ROS, former smoker,    Pulmonary exam normal breath sounds clear to auscultation       Cardiovascular hypertension, Pt. on medications + Peripheral Vascular Disease  Normal cardiovascular exam Rhythm:Regular Rate:Normal  Hx/o LLE thrombus in 2019- S/P thrombectomy takes xarelto   Neuro/Psych  Neuromuscular disease negative psych ROS   GI/Hepatic negative GI ROS, Neg liver ROS,   Endo/Other  Obesity  Renal/GU negative Renal ROS  negative genitourinary   Musculoskeletal negative musculoskeletal ROS (+)   Abdominal (+) + obese,   Peds  Hematology  (+) anemia , Xarelto therapy- last dose 2 days ago   Anesthesia Other Findings   Reproductive/Obstetrics Cervical Ca                            Anesthesia Physical Anesthesia Plan  ASA: II  Anesthesia Plan: General   Post-op Pain Management:    Induction: Intravenous  PONV Risk Score and Plan: 4 or greater and Ondansetron, Treatment may vary due to age or medical condition, Dexamethasone and Midazolam  Airway Management Planned: LMA  Additional Equipment:   Intra-op Plan:   Post-operative Plan:   Informed Consent: I have reviewed the patients History and Physical, chart, labs and discussed the procedure including the risks, benefits and alternatives for the proposed anesthesia with the patient or authorized representative who has indicated his/her understanding and acceptance.     Dental advisory given  Plan Discussed with: CRNA and Surgeon  Anesthesia Plan Comments:          Anesthesia Quick Evaluation

## 2019-08-20 NOTE — Progress Notes (Signed)
  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  Identity was confirmed.  All relevant records and images related to the planned course of therapy were reviewed.  The patient freely provided informed written consent to proceed with treatment after reviewing the details related to the planned course of therapy. The consent form was witnessed and verified by the simulation staff.  Then, the patient was set-up in a stable reproducible  supine position for radiation therapy.  CT images were obtained.  Surface markings were placed.  The CT images were loaded into the planning software.  Then the target and avoidance structures were contoured.  Treatment planning then occurred.  The radiation prescription was entered and confirmed.   I have requested : Brachytherapy Isodose Plan and Dosimetry Calculations to plan the radiation distribution.    PLAN:  The patient will receive 5.5 Gy in 1 fraction directed at the high risk clinical target volume. Iridium 192 will be the high-dose-rate source.     -----------------------------------  Blair Promise, PhD, MD  This document serves as a record of services personally performed by Gery Pray, MD. It was created on his behalf by Clerance Lav, a trained medical scribe. The creation of this record is based on the scribe's personal observations and the provider's statements to them. This document has been checked and approved by the attending provider.

## 2019-08-21 ENCOUNTER — Ambulatory Visit: Payer: BLUE CROSS/BLUE SHIELD | Admitting: Radiation Oncology

## 2019-08-21 ENCOUNTER — Encounter (HOSPITAL_BASED_OUTPATIENT_CLINIC_OR_DEPARTMENT_OTHER): Payer: Self-pay | Admitting: Radiation Oncology

## 2019-08-22 ENCOUNTER — Encounter (HOSPITAL_BASED_OUTPATIENT_CLINIC_OR_DEPARTMENT_OTHER): Payer: Self-pay | Admitting: *Deleted

## 2019-08-22 ENCOUNTER — Ambulatory Visit: Payer: BLUE CROSS/BLUE SHIELD | Admitting: Radiation Oncology

## 2019-08-22 NOTE — Progress Notes (Signed)
Spoke w/ via phone for pre-op interview---Emma Medina needs dos----urine poct              [facility] results------ COVID test ------08-25-2019 Arrive at -------530 am 08-29-2019 NPO after ------mdinight Medications to take morning of surgery -----none Diabetic medication -----n/a Patient Special Instructions ----- Pre-Op special Istructions ----- Patient verbalized understanding of instructions that were given at this phone interview. Patient denies shortness of breath, chest pain, fever, cough a this phone interview.

## 2019-08-24 ENCOUNTER — Other Ambulatory Visit: Payer: Self-pay

## 2019-08-25 ENCOUNTER — Other Ambulatory Visit (HOSPITAL_COMMUNITY)
Admission: RE | Admit: 2019-08-25 | Discharge: 2019-08-25 | Disposition: A | Discharge status: Home / Self Care | Payer: BLUE CROSS/BLUE SHIELD | Source: Ambulatory Visit | Attending: Radiation Oncology | Admitting: Radiation Oncology

## 2019-08-25 DIAGNOSIS — Z20828 Contact with and (suspected) exposure to other viral communicable diseases: Secondary | ICD-10-CM | POA: Insufficient documentation

## 2019-08-25 DIAGNOSIS — Z01812 Encounter for preprocedural laboratory examination: Secondary | ICD-10-CM | POA: Insufficient documentation

## 2019-08-27 LAB — NOVEL CORONAVIRUS, NAA (HOSP ORDER, SEND-OUT TO REF LAB; TAT 18-24 HRS): SARS-CoV-2, NAA: NOT DETECTED

## 2019-08-28 ENCOUNTER — Ambulatory Visit: Payer: BLUE CROSS/BLUE SHIELD | Admitting: Radiation Oncology

## 2019-08-29 ENCOUNTER — Ambulatory Visit (HOSPITAL_COMMUNITY)
Admission: RE | Admit: 2019-08-29 | Discharge: 2019-08-29 | Disposition: A | Discharge status: Home / Self Care | Payer: BLUE CROSS/BLUE SHIELD | Source: Ambulatory Visit | Attending: Radiation Oncology | Admitting: Radiation Oncology

## 2019-08-29 ENCOUNTER — Ambulatory Visit (HOSPITAL_BASED_OUTPATIENT_CLINIC_OR_DEPARTMENT_OTHER): Payer: BLUE CROSS/BLUE SHIELD | Admitting: Anesthesiology

## 2019-08-29 ENCOUNTER — Other Ambulatory Visit: Payer: Self-pay

## 2019-08-29 ENCOUNTER — Ambulatory Visit
Admission: RE | Admit: 2019-08-29 | Discharge: 2019-08-29 | Disposition: A | Discharge status: Home / Self Care | Payer: BLUE CROSS/BLUE SHIELD | Source: Ambulatory Visit | Attending: Radiation Oncology | Admitting: Radiation Oncology

## 2019-08-29 ENCOUNTER — Encounter (HOSPITAL_BASED_OUTPATIENT_CLINIC_OR_DEPARTMENT_OTHER): Payer: Self-pay | Admitting: Emergency Medicine

## 2019-08-29 ENCOUNTER — Ambulatory Visit (HOSPITAL_BASED_OUTPATIENT_CLINIC_OR_DEPARTMENT_OTHER)
Admission: RE | Admit: 2019-08-29 | Discharge: 2019-08-29 | Disposition: A | Discharge status: Still In Hospital | Payer: BLUE CROSS/BLUE SHIELD | Attending: Radiation Oncology | Admitting: Radiation Oncology

## 2019-08-29 ENCOUNTER — Encounter (HOSPITAL_BASED_OUTPATIENT_CLINIC_OR_DEPARTMENT_OTHER): Payer: Self-pay | Admitting: *Deleted

## 2019-08-29 ENCOUNTER — Encounter (HOSPITAL_BASED_OUTPATIENT_CLINIC_OR_DEPARTMENT_OTHER)
Admission: RE | Disposition: A | Discharge status: Still In Hospital | Payer: Self-pay | Source: Home / Self Care | Attending: Radiation Oncology

## 2019-08-29 VITALS — BP 107/69 | HR 66 | Temp 98.4°F | Resp 20

## 2019-08-29 DIAGNOSIS — Z86718 Personal history of other venous thrombosis and embolism: Secondary | ICD-10-CM | POA: Diagnosis not present

## 2019-08-29 DIAGNOSIS — I739 Peripheral vascular disease, unspecified: Secondary | ICD-10-CM | POA: Insufficient documentation

## 2019-08-29 DIAGNOSIS — I10 Essential (primary) hypertension: Secondary | ICD-10-CM | POA: Diagnosis not present

## 2019-08-29 DIAGNOSIS — Z8541 Personal history of malignant neoplasm of cervix uteri: Secondary | ICD-10-CM

## 2019-08-29 DIAGNOSIS — Z87891 Personal history of nicotine dependence: Secondary | ICD-10-CM | POA: Insufficient documentation

## 2019-08-29 DIAGNOSIS — Z7901 Long term (current) use of anticoagulants: Secondary | ICD-10-CM | POA: Insufficient documentation

## 2019-08-29 DIAGNOSIS — C539 Malignant neoplasm of cervix uteri, unspecified: Secondary | ICD-10-CM | POA: Insufficient documentation

## 2019-08-29 DIAGNOSIS — C538 Malignant neoplasm of overlapping sites of cervix uteri: Secondary | ICD-10-CM

## 2019-08-29 DIAGNOSIS — Z7982 Long term (current) use of aspirin: Secondary | ICD-10-CM | POA: Diagnosis not present

## 2019-08-29 HISTORY — PX: TANDEM RING INSERTION: SHX6199

## 2019-08-29 HISTORY — PX: OPERATIVE ULTRASOUND: SHX5996

## 2019-08-29 LAB — POCT PREGNANCY, URINE: Preg Test, Ur: NEGATIVE

## 2019-08-29 SURGERY — INSERTION, UTERINE TANDEM AND RING OR CYLINDER, FOR BRACHYTHERAPY
Anesthesia: General | Site: Vagina

## 2019-08-29 MED ORDER — ACETAMINOPHEN 325 MG PO TABS
ORAL_TABLET | ORAL | Status: DC | PRN
  Administered 2019-08-29: 1000 mg via ORAL

## 2019-08-29 MED ORDER — FENTANYL CITRATE (PF) 100 MCG/2ML IJ SOLN
INTRAMUSCULAR | Status: DC | PRN
  Administered 2019-08-29 (×2): 25 ug via INTRAVENOUS
  Administered 2019-08-29 (×2): 50 ug via INTRAVENOUS

## 2019-08-29 MED ORDER — ARTIFICIAL TEARS OPHTHALMIC OINT
TOPICAL_OINTMENT | OPHTHALMIC | Status: AC
  Filled 2019-08-29: qty 3.5

## 2019-08-29 MED ORDER — FENTANYL CITRATE (PF) 100 MCG/2ML IJ SOLN
INTRAMUSCULAR | Status: AC
  Filled 2019-08-29: qty 2

## 2019-08-29 MED ORDER — OXYCODONE HCL 5 MG/5ML PO SOLN
5.0000 mg | Freq: Once | ORAL | Status: DC | PRN
  Filled 2019-08-29: qty 5

## 2019-08-29 MED ORDER — LIDOCAINE 2% (20 MG/ML) 5 ML SYRINGE
INTRAMUSCULAR | Status: DC | PRN
  Administered 2019-08-29: 100 mg via INTRAVENOUS

## 2019-08-29 MED ORDER — LACTATED RINGERS IV SOLN
INTRAVENOUS | Status: DC
  Administered 2019-08-29: 07:00:00 via INTRAVENOUS
  Filled 2019-08-29: qty 1000

## 2019-08-29 MED ORDER — MIDAZOLAM HCL 2 MG/2ML IJ SOLN
INTRAMUSCULAR | Status: AC
  Filled 2019-08-29: qty 2

## 2019-08-29 MED ORDER — DEXAMETHASONE SODIUM PHOSPHATE 10 MG/ML IJ SOLN
INTRAMUSCULAR | Status: DC | PRN
  Administered 2019-08-29: 5 mg via INTRAVENOUS

## 2019-08-29 MED ORDER — KETOROLAC TROMETHAMINE 30 MG/ML IJ SOLN
INTRAMUSCULAR | Status: DC | PRN
  Administered 2019-08-29: 30 mg via INTRAVENOUS

## 2019-08-29 MED ORDER — FENTANYL CITRATE (PF) 100 MCG/2ML IJ SOLN
25.0000 ug | INTRAMUSCULAR | Status: DC | PRN
  Administered 2019-08-29: 09:00:00 25 ug via INTRAVENOUS
  Filled 2019-08-29: qty 1

## 2019-08-29 MED ORDER — ONDANSETRON HCL 4 MG/2ML IJ SOLN
INTRAMUSCULAR | Status: DC | PRN
  Administered 2019-08-29: 4 mg via INTRAVENOUS

## 2019-08-29 MED ORDER — SODIUM CHLORIDE 0.9 % IR SOLN
Status: DC | PRN
  Administered 2019-08-29: 250 mL via INTRAVESICAL

## 2019-08-29 MED ORDER — OXYCODONE HCL 5 MG PO TABS
5.0000 mg | ORAL_TABLET | Freq: Once | ORAL | Status: DC | PRN
  Filled 2019-08-29: qty 1

## 2019-08-29 MED ORDER — HYDROMORPHONE HCL 1 MG/ML IJ SOLN
0.5000 mg | Freq: Once | INTRAMUSCULAR | Status: AC
  Administered 2019-08-29: 11:00:00 0.5 mg via INTRAVENOUS
  Filled 2019-08-29: qty 1

## 2019-08-29 MED ORDER — HYDROMORPHONE HCL 1 MG/ML IJ SOLN
0.5000 mg | Freq: Once | INTRAMUSCULAR | Status: AC
  Administered 2019-08-29: 14:00:00 0.5 mg via INTRAVENOUS
  Filled 2019-08-29: qty 1

## 2019-08-29 MED ORDER — LIDOCAINE 2% (20 MG/ML) 5 ML SYRINGE
INTRAMUSCULAR | Status: AC
  Filled 2019-08-29: qty 5

## 2019-08-29 MED ORDER — LACTATED RINGERS IV SOLN
Freq: Once | INTRAVENOUS | Status: AC
  Administered 2019-08-29: 11:00:00 via INTRAVENOUS
  Filled 2019-08-29: qty 250

## 2019-08-29 MED ORDER — PROPOFOL 10 MG/ML IV BOLUS
INTRAVENOUS | Status: DC | PRN
  Administered 2019-08-29: 50 mg via INTRAVENOUS
  Administered 2019-08-29: 150 mg via INTRAVENOUS

## 2019-08-29 MED ORDER — MIDAZOLAM HCL 2 MG/2ML IJ SOLN
INTRAMUSCULAR | Status: DC | PRN
  Administered 2019-08-29: 2 mg via INTRAVENOUS

## 2019-08-29 MED ORDER — ACETAMINOPHEN 500 MG PO TABS
ORAL_TABLET | ORAL | Status: AC
  Filled 2019-08-29: qty 2

## 2019-08-29 MED ORDER — ONDANSETRON HCL 4 MG/2ML IJ SOLN
INTRAMUSCULAR | Status: AC
  Filled 2019-08-29: qty 2

## 2019-08-29 MED ORDER — PROPOFOL 10 MG/ML IV BOLUS
INTRAVENOUS | Status: AC
  Filled 2019-08-29: qty 20

## 2019-08-29 MED ORDER — KETOROLAC TROMETHAMINE 30 MG/ML IJ SOLN
INTRAMUSCULAR | Status: AC
  Filled 2019-08-29: qty 1

## 2019-08-29 MED ORDER — DEXAMETHASONE SODIUM PHOSPHATE 10 MG/ML IJ SOLN
INTRAMUSCULAR | Status: AC
  Filled 2019-08-29: qty 1

## 2019-08-29 SURGICAL SUPPLY — 19 items
BNDG CONFORM 2 STRL LF (GAUZE/BANDAGES/DRESSINGS) IMPLANT
COVER WAND RF STERILE (DRAPES) ×4 IMPLANT
DILATOR CANAL MILEX (MISCELLANEOUS) IMPLANT
DRSG PAD ABDOMINAL 8X10 ST (GAUZE/BANDAGES/DRESSINGS) ×4 IMPLANT
GAUZE 4X4 16PLY RFD (DISPOSABLE) ×4 IMPLANT
GLOVE BIO SURGEON STRL SZ7.5 (GLOVE) ×8 IMPLANT
GOWN STRL REUS W/TWL LRG LVL3 (GOWN DISPOSABLE) ×4 IMPLANT
HOLDER FOLEY CATH W/STRAP (MISCELLANEOUS) ×4 IMPLANT
HOVERMATT SINGLE USE (MISCELLANEOUS) ×4 IMPLANT
IV NS 1000ML (IV SOLUTION) ×2
IV NS 1000ML BAXH (IV SOLUTION) ×2 IMPLANT
IV SET EXTENSION GRAVITY 40 LF (IV SETS) ×4 IMPLANT
KIT TURNOVER CYSTO (KITS) ×4 IMPLANT
PACK VAGINAL MINOR WOMEN LF (CUSTOM PROCEDURE TRAY) ×4 IMPLANT
PACKING VAGINAL (PACKING) IMPLANT
PAD OB MATERNITY 4.3X12.25 (PERSONAL CARE ITEMS) IMPLANT
TOWEL OR 17X26 10 PK STRL BLUE (TOWEL DISPOSABLE) ×4 IMPLANT
TRAY FOLEY W/BAG SLVR 14FR (SET/KITS/TRAYS/PACK) ×4 IMPLANT
WATER STERILE IRR 500ML POUR (IV SOLUTION) IMPLANT

## 2019-08-29 NOTE — Transfer of Care (Signed)
Immediate Anesthesia Transfer of Care Note  Patient: Emma Medina  Procedure(s) Performed: TANDEM RING INSERTION (N/A Vagina ) OPERATIVE ULTRASOUND (N/A Abdomen)  Patient Location: PACU  Anesthesia Type:General  Level of Consciousness: awake, alert , oriented and patient cooperative  Airway & Oxygen Therapy: Patient Spontanous Breathing and Patient connected to nasal cannula oxygen  Post-op Assessment: Report given to RN and Post -op Vital signs reviewed and stable  Post vital signs: Reviewed and stable  Last Vitals:  Vitals Value Taken Time  BP    Temp    Pulse 94 08/29/19 0822  Resp 23 08/29/19 0822  SpO2 99 % 08/29/19 0822  Vitals shown include unvalidated device data.  Last Pain:  Vitals:   08/29/19 0643  TempSrc: Oral      Patients Stated Pain Goal: 5 ([id number])  Complications: No apparent anesthesia complications

## 2019-08-29 NOTE — Interval H&P Note (Signed)
History and Physical Interval Note:  08/29/2019 7:24 AM  Emma Medina  has presented today for surgery, with the diagnosis of CERVICAL CANCER.  The various methods of treatment have been discussed with the patient and family. After consideration of risks, benefits and other options for treatment, the patient has consented to  Procedure(s): TANDEM RING INSERTION (N/A) OPERATIVE ULTRASOUND (N/A) as a surgical intervention.  The patient's history has been reviewed, patient examined, no change in status, stable for surgery.  I have reviewed the patient's chart and labs.  Questions were answered to the patient's satisfaction.     Gery Pray

## 2019-08-29 NOTE — Progress Notes (Signed)
Pt was received from [facility] OP PACU and transported to [place]. Pt in no apparent distress. Loma Sousa, RN BSN   Pt received from [employer]. VSS. Pt taking PO without difficulty. Foley catheter patent and draining clear yellow urine. IVF infusing via PIV without difficulty. Loma Sousa, RN BSN   Pt tolerated HDR treatment. Foley catheter was removed without difficulty per this RN. PIV removed without difficulty per this RN, tip intact. Tandem and Ring device removed per Dr. Sondra Come, pt tolerated well. Pt assisted with dressing. Pt was discharged to family member in no apparent distress via WC. Pt aware and able to verbalize discharge instructions. Loma Sousa, RN BSN

## 2019-08-29 NOTE — Anesthesia Procedure Notes (Signed)
Procedure Name: LMA Insertion Date/Time: 08/29/2019 7:41 AM Performed by: Wanita Chamberlain, CRNA Pre-anesthesia Checklist: Patient identified, Timeout performed, Emergency Drugs available, Suction available and Patient being monitored Patient Re-evaluated:Patient Re-evaluated prior to induction Oxygen Delivery Method: Circle system utilized Preoxygenation: Pre-oxygenation with 100% oxygen Induction Type: IV induction Ventilation: Mask ventilation without difficulty LMA: LMA inserted LMA Size: 4.0 Number of attempts: 1

## 2019-08-29 NOTE — Progress Notes (Signed)
   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 Identity was confirmed. All relevant records and images related to the planned course of therapy were reviewed. The patient freely provided informed written consent to proceed with treatment after reviewing the details related to the planned course of therapy. The consent form was witnessed and verified by the simulation staff. Then, the patient was set-up in a stable reproducible supine position for radiation therapy. The tandem ring system was accessed and fiducial markers were placed within the tandem and ring.   Simple treatment device note: On the operating room the patient had construction of her custom tandem ring system. She will be treated with a 30 tandem/ring system. The patient had placement of a 60 mm tandem. A cervical ring with a small shielding was used for her treatment. A rectal paddle was also part of her custom set up device.  Verification simulation note: An AP and lateral film was obtained through the pelvis area. This was compared to the patient's planning films documenting accurate position of the tandem/ring system for treatment.  High-dose-rate brachytherapy treatment note:  The remote afterloading device was accessed through catheter system and attached to the tandem ring system. Patient then proceeded to undergo her third high-dose-rate treatment directed at the cervix. The patient was prescribed a dose of 5.5 gray to be delivered to the [place]. Patient was treated with 2 channels using 27 dwell positions. Treatment time was 480.1 seconds. The patient tolerated the procedure well. After completion of her therapy, a radiation  survey was performed documenting return of the iridium source into the [employer] safe. The patient was then transferred to the nursing suite. She then had removal of the rectal paddle followed by the tandem and ring system. The patient tolerated the removal well with minimal bleeding.  PLAN: The patient will return next week for her fourth high-dose-rate treatment directed at the cervix. ________________________________    Blair Promise, PhD, MD  This document serves as a record of services personally performed by Gery Pray, MD. It was created on his behalf by Clerance Lav, a trained medical scribe. The creation of this record is based on the scribe's personal observations and the provider's statements to them. This document has been checked and approved by the attending provider.

## 2019-08-29 NOTE — Brief Op Note (Signed)
08/29/2019  8:42 AM  PATIENT:  Emma Medina  48 y.o. female  PRE-OPERATIVE DIAGNOSIS:  CERVICAL CANCER  POST-OPERATIVE DIAGNOSIS:  CERVICAL CANCER  PROCEDURE:  Procedure(s): TANDEM RING INSERTION (N/A) OPERATIVE ULTRASOUND (N/A)  SURGEON:  Surgeon(s) and Role:    * Gery Pray, MD - Primary  PHYSICIAN ASSISTANT:   ASSISTANTS: none   ANESTHESIA:   general  EBL:  5 mL   BLOOD ADMINISTERED:none  DRAINS: Urinary Catheter (Foley)   LOCAL MEDICATIONS USED:  NONE  SPECIMEN:  No Specimen  DISPOSITION OF SPECIMEN:  N/A  COUNTS:  YES  TOURNIQUET:  * No tourniquets in log *  DICTATION: The patient was taken to the outpatient OR #4. Timeout was performedfor the procedure,preoperative antibiotics and preoperative medications. Patient was prepped and draped in the usual sterile fashion and placed in the dorsolithotomy position. Under sterile conditions a Foley catheter was placed. Exam under anesthesia revealed that the cervical mass was approximately 2.5 x 2.5 cm. There is no parametrial extension noted. Patient proceeded to undergo dilation and sounding of the uterus. The uterus was anteverted and measured approximately 8.5 cm on sounding. Patient did have approximately 250 cc of sterile water placed in the bladder for imaging purposes. Excellent ultrasound imaging was obtained during the procedure. After dilation of the cervical osthe patient had a 60 mm 30 degree tandem placed within the endometrial canal. Placement was verified with axial and sagittal images. Prior to placement of the tandem the patient had a 60 mm cervical sleeve placed within the endocervical and endometrial canal. Patient then had a 30 degree ring with a small shielding cap placed at the region of the cervix and attached to the tandem. Patient then had placement of a rectal paddle to displace the rectum out of the high-dose radiation field. Patient tolerated procedure well. After recovery in  PACU the patient will then be taken to the radiation oncology department for planning and her third high-dose-rate treatment. The patient is scheduled to receive 5 brachytherapy procedures to complete her definitive course of radiation therapy.  PLAN OF CARE: Transferred to radiation oncology for planning and treatment  PATIENT DISPOSITION:  PACU - hemodynamically stable.   Delay start of Pharmacological VTE agent (>24hrs) due to surgical blood loss or risk of bleeding: not applicable

## 2019-08-29 NOTE — Anesthesia Preprocedure Evaluation (Addendum)
Anesthesia Evaluation  Patient identified by MRN, date of birth, ID band Patient awake    Reviewed: Allergy & Precautions, NPO status , Patient's Chart, lab work & pertinent test results  History of Anesthesia Complications Negative for: history of anesthetic complications  Airway Mallampati: II  TM Distance: >3 FB Neck ROM: Full    Dental  (+) Teeth Intact, Dental Advisory Given   Pulmonary neg recent URI, former smoker,    breath sounds clear to auscultation       Cardiovascular hypertension, + Peripheral Vascular Disease and + DVT   Rhythm:Regular     Neuro/Psych  Neuromuscular disease negative psych ROS   GI/Hepatic negative GI ROS, Neg liver ROS,   Endo/Other  Morbid obesity  Renal/GU negative Renal ROS     Musculoskeletal negative musculoskeletal ROS (+)   Abdominal   Peds  Hematology  (+) Blood dyscrasia, anemia ,   Anesthesia Other Findings - Left ventricle: The cavity size was normal. Wall thickness was   normal. Systolic function was normal. The estimated ejection   fraction was in the range of 60% to 65%. Wall motion was normal;   there were no regional wall motion abnormalities. Features are   consistent with a pseudonormal left ventricular filling pattern,   with concomitant abnormal relaxation and increased filling   pressure (grade 2 diastolic dysfunction). - Aortic valve: There was no stenosis. - Mitral valve: Mildly calcified annulus. There was no significant   regurgitation. - Left atrium: The atrium was mildly dilated. - Right ventricle: The cavity size was normal. Systolic function   was normal. - Pulmonary arteries: No complete TR doppler jet so unable to   estimate PA systolic pressure. - Inferior vena cava: The vessel was normal in size. The   respirophasic diameter changes were in the normal range (>= 50%),   consistent with normal central venous pressure.  Reproductive/Obstetrics                          Anesthesia Physical Anesthesia Plan  ASA: III  Anesthesia Plan: General   Post-op Pain Management:    Induction: Intravenous  PONV Risk Score and Plan: 3 and Ondansetron and Dexamethasone  Airway Management Planned: LMA  Additional Equipment: None  Intra-op Plan:   Post-operative Plan: Extubation in OR  Informed Consent: I have reviewed the patients History and Physical, chart, labs and discussed the procedure including the risks, benefits and alternatives for the proposed anesthesia with the patient or authorized representative who has indicated his/her understanding and acceptance.     Dental advisory given  Plan Discussed with: CRNA, Surgeon and Anesthesiologist  Anesthesia Plan Comments:        Anesthesia Quick Evaluation

## 2019-08-29 NOTE — Progress Notes (Signed)
Spoke w/ via phone for pre-op interview---Emma Medina needs dos----urine poct              Medina results------ COVID test ------09-03-2019 Arrive at -------740 am 09-06-2019 NPO after ------midnight Medications to take morning of surgery -----none Diabetic medication -----n/a Patient Special Instructions ----- Pre-Op special Istructions ----- Patient verbalized understanding of instructions that were given at this phone interview. Patient denies shortness of breath, chest pain, fever, cough a this phone interview.

## 2019-08-29 NOTE — Progress Notes (Signed)
  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  Identity was confirmed.  All relevant records and images related to the planned course of therapy were reviewed.  The patient freely provided informed written consent to proceed with treatment after reviewing the details related to the planned course of therapy. The consent form was witnessed and verified by the simulation staff.  Then, the patient was set-up in a stable reproducible  supine position for radiation therapy.  CT images were obtained.  Surface markings were placed.  The CT images were loaded into the planning software.  Then the target and avoidance structures were contoured.  Treatment planning then occurred.  The radiation prescription was entered and confirmed.   I have requested : Brachytherapy Isodose Plan and Dosimetry Calculations to plan the radiation distribution.    PLAN: The patient will receive 5.5 Gy in 1 fraction directed at the high risk clinical target volume. Iridium 192 will be the high-dose-rate source.  _________________________   Blair Promise, PhD, MD  This document serves as a record of services personally performed by Gery Pray, MD. It was created on his behalf by Clerance Lav, a trained medical scribe. The creation of this record is based on the scribe's personal observations and the provider's statements to them. This document has been checked and approved by the attending provider.

## 2019-09-03 ENCOUNTER — Other Ambulatory Visit (HOSPITAL_COMMUNITY)
Admission: RE | Admit: 2019-09-03 | Discharge: 2019-09-03 | Disposition: A | Discharge status: Home / Self Care | Payer: BLUE CROSS/BLUE SHIELD | Source: Ambulatory Visit | Attending: Radiation Oncology | Admitting: Radiation Oncology

## 2019-09-03 DIAGNOSIS — Z20828 Contact with and (suspected) exposure to other viral communicable diseases: Secondary | ICD-10-CM | POA: Insufficient documentation

## 2019-09-03 DIAGNOSIS — Z01812 Encounter for preprocedural laboratory examination: Secondary | ICD-10-CM | POA: Insufficient documentation

## 2019-09-04 ENCOUNTER — Encounter (HOSPITAL_BASED_OUTPATIENT_CLINIC_OR_DEPARTMENT_OTHER): Payer: Self-pay | Admitting: Radiation Oncology

## 2019-09-04 LAB — NOVEL CORONAVIRUS, NAA (HOSP ORDER, SEND-OUT TO REF LAB; TAT 18-24 HRS): SARS-CoV-2, NAA: NOT DETECTED

## 2019-09-06 ENCOUNTER — Ambulatory Visit
Admission: RE | Admit: 2019-09-06 | Discharge: 2019-09-06 | Disposition: A | Discharge status: Home / Self Care | Payer: BLUE CROSS/BLUE SHIELD | Source: Ambulatory Visit | Attending: Radiation Oncology | Admitting: Radiation Oncology

## 2019-09-06 ENCOUNTER — Ambulatory Visit (HOSPITAL_BASED_OUTPATIENT_CLINIC_OR_DEPARTMENT_OTHER): Payer: BLUE CROSS/BLUE SHIELD | Admitting: Anesthesiology

## 2019-09-06 ENCOUNTER — Encounter (HOSPITAL_BASED_OUTPATIENT_CLINIC_OR_DEPARTMENT_OTHER): Payer: Self-pay | Admitting: Anesthesiology

## 2019-09-06 ENCOUNTER — Encounter (HOSPITAL_BASED_OUTPATIENT_CLINIC_OR_DEPARTMENT_OTHER)
Admission: RE | Disposition: A | Discharge status: Home / Self Care | Payer: Self-pay | Source: Home / Self Care | Attending: Radiation Oncology

## 2019-09-06 ENCOUNTER — Ambulatory Visit (HOSPITAL_BASED_OUTPATIENT_CLINIC_OR_DEPARTMENT_OTHER)
Admission: RE | Admit: 2019-09-06 | Discharge: 2019-09-06 | Disposition: A | Discharge status: Home / Self Care | Payer: BLUE CROSS/BLUE SHIELD | Attending: Radiation Oncology | Admitting: Radiation Oncology

## 2019-09-06 ENCOUNTER — Ambulatory Visit (HOSPITAL_COMMUNITY)
Admission: RE | Admit: 2019-09-06 | Discharge: 2019-09-06 | Disposition: A | Discharge status: Home / Self Care | Payer: BLUE CROSS/BLUE SHIELD | Source: Ambulatory Visit | Attending: Radiation Oncology | Admitting: Radiation Oncology

## 2019-09-06 ENCOUNTER — Other Ambulatory Visit: Payer: Self-pay

## 2019-09-06 DIAGNOSIS — Z87891 Personal history of nicotine dependence: Secondary | ICD-10-CM | POA: Diagnosis not present

## 2019-09-06 DIAGNOSIS — Z8541 Personal history of malignant neoplasm of cervix uteri: Secondary | ICD-10-CM

## 2019-09-06 DIAGNOSIS — I739 Peripheral vascular disease, unspecified: Secondary | ICD-10-CM | POA: Diagnosis not present

## 2019-09-06 DIAGNOSIS — Z6837 Body mass index (BMI) 37.0-37.9, adult: Secondary | ICD-10-CM | POA: Insufficient documentation

## 2019-09-06 DIAGNOSIS — Z9221 Personal history of antineoplastic chemotherapy: Secondary | ICD-10-CM | POA: Insufficient documentation

## 2019-09-06 DIAGNOSIS — I10 Essential (primary) hypertension: Secondary | ICD-10-CM | POA: Diagnosis not present

## 2019-09-06 DIAGNOSIS — Z7901 Long term (current) use of anticoagulants: Secondary | ICD-10-CM | POA: Diagnosis not present

## 2019-09-06 DIAGNOSIS — Z7982 Long term (current) use of aspirin: Secondary | ICD-10-CM | POA: Insufficient documentation

## 2019-09-06 DIAGNOSIS — Z923 Personal history of irradiation: Secondary | ICD-10-CM | POA: Insufficient documentation

## 2019-09-06 DIAGNOSIS — C538 Malignant neoplasm of overlapping sites of cervix uteri: Secondary | ICD-10-CM

## 2019-09-06 DIAGNOSIS — Z79899 Other long term (current) drug therapy: Secondary | ICD-10-CM | POA: Diagnosis not present

## 2019-09-06 DIAGNOSIS — Z510 Encounter for antineoplastic radiation therapy: Secondary | ICD-10-CM | POA: Insufficient documentation

## 2019-09-06 DIAGNOSIS — C539 Malignant neoplasm of cervix uteri, unspecified: Secondary | ICD-10-CM | POA: Insufficient documentation

## 2019-09-06 HISTORY — PX: OPERATIVE ULTRASOUND: SHX5996

## 2019-09-06 HISTORY — PX: TANDEM RING INSERTION: SHX6199

## 2019-09-06 SURGERY — INSERTION, UTERINE TANDEM AND RING OR CYLINDER, FOR BRACHYTHERAPY
Anesthesia: General | Site: Cervix

## 2019-09-06 MED ORDER — KETOROLAC TROMETHAMINE 30 MG/ML IJ SOLN
INTRAMUSCULAR | Status: DC | PRN
  Administered 2019-09-06: 30 mg via INTRAVENOUS

## 2019-09-06 MED ORDER — SODIUM CHLORIDE 0.9 % IR SOLN
Status: DC | PRN
  Administered 2019-09-06: 200 mL

## 2019-09-06 MED ORDER — HYDROMORPHONE HCL 1 MG/ML IJ SOLN
0.5000 mg | Freq: Once | INTRAMUSCULAR | Status: AC
  Administered 2019-09-06: 0.5 mg via INTRAVENOUS
  Filled 2019-09-06: qty 1

## 2019-09-06 MED ORDER — MEPERIDINE HCL 25 MG/ML IJ SOLN
6.2500 mg | INTRAMUSCULAR | Status: DC | PRN
  Filled 2019-09-06: qty 1

## 2019-09-06 MED ORDER — SCOPOLAMINE 1 MG/3DAYS TD PT72
MEDICATED_PATCH | TRANSDERMAL | Status: AC
  Filled 2019-09-06: qty 1

## 2019-09-06 MED ORDER — ACETAMINOPHEN 500 MG PO TABS
ORAL_TABLET | ORAL | Status: AC
  Filled 2019-09-06: qty 2

## 2019-09-06 MED ORDER — FENTANYL CITRATE (PF) 100 MCG/2ML IJ SOLN
INTRAMUSCULAR | Status: AC
  Filled 2019-09-06: qty 2

## 2019-09-06 MED ORDER — MIDAZOLAM HCL 2 MG/2ML IJ SOLN
INTRAMUSCULAR | Status: AC
  Filled 2019-09-06: qty 2

## 2019-09-06 MED ORDER — MIDAZOLAM HCL 2 MG/2ML IJ SOLN
0.5000 mg | Freq: Once | INTRAMUSCULAR | Status: DC | PRN
  Filled 2019-09-06: qty 2

## 2019-09-06 MED ORDER — LACTATED RINGERS IV SOLN
INTRAVENOUS | Status: DC
  Administered 2019-09-06: 50 mL/h via INTRAVENOUS
  Filled 2019-09-06 (×2): qty 1000

## 2019-09-06 MED ORDER — MIDAZOLAM HCL 5 MG/5ML IJ SOLN
INTRAMUSCULAR | Status: DC | PRN
  Administered 2019-09-06: 2 mg via INTRAVENOUS

## 2019-09-06 MED ORDER — LIDOCAINE 2% (20 MG/ML) 5 ML SYRINGE
INTRAMUSCULAR | Status: DC | PRN
  Administered 2019-09-06: 60 mg via INTRAVENOUS

## 2019-09-06 MED ORDER — ONDANSETRON HCL 4 MG/2ML IJ SOLN
INTRAMUSCULAR | Status: AC
  Filled 2019-09-06: qty 2

## 2019-09-06 MED ORDER — HYDROMORPHONE HCL 1 MG/ML IJ SOLN
INTRAMUSCULAR | Status: AC
  Filled 2019-09-06: qty 1

## 2019-09-06 MED ORDER — SCOPOLAMINE 1 MG/3DAYS TD PT72
1.0000 | MEDICATED_PATCH | TRANSDERMAL | Status: DC
  Administered 2019-09-06: 1.5 mg via TRANSDERMAL
  Filled 2019-09-06: qty 1

## 2019-09-06 MED ORDER — LACTATED RINGERS IV SOLN
INTRAVENOUS | Status: DC
  Administered 2019-09-06: 12:00:00 via INTRAVENOUS
  Filled 2019-09-06 (×2): qty 250

## 2019-09-06 MED ORDER — KETOROLAC TROMETHAMINE 30 MG/ML IJ SOLN
INTRAMUSCULAR | Status: AC
  Filled 2019-09-06: qty 1

## 2019-09-06 MED ORDER — LIDOCAINE 2% (20 MG/ML) 5 ML SYRINGE
INTRAMUSCULAR | Status: AC
  Filled 2019-09-06: qty 5

## 2019-09-06 MED ORDER — DEXAMETHASONE SODIUM PHOSPHATE 10 MG/ML IJ SOLN
INTRAMUSCULAR | Status: AC
  Filled 2019-09-06: qty 1

## 2019-09-06 MED ORDER — ONDANSETRON HCL 4 MG/2ML IJ SOLN
INTRAMUSCULAR | Status: DC | PRN
  Administered 2019-09-06: 4 mg via INTRAVENOUS

## 2019-09-06 MED ORDER — PROPOFOL 10 MG/ML IV BOLUS
INTRAVENOUS | Status: AC
  Filled 2019-09-06: qty 20

## 2019-09-06 MED ORDER — DEXAMETHASONE SODIUM PHOSPHATE 4 MG/ML IJ SOLN
INTRAMUSCULAR | Status: DC | PRN
  Administered 2019-09-06: 10 mg via INTRAVENOUS

## 2019-09-06 MED ORDER — FENTANYL CITRATE (PF) 100 MCG/2ML IJ SOLN
INTRAMUSCULAR | Status: DC | PRN
  Administered 2019-09-06: 25 ug via INTRAVENOUS
  Administered 2019-09-06: 50 ug via INTRAVENOUS
  Administered 2019-09-06 (×5): 25 ug via INTRAVENOUS

## 2019-09-06 MED ORDER — PROMETHAZINE HCL 25 MG/ML IJ SOLN
6.2500 mg | INTRAMUSCULAR | Status: DC | PRN
  Filled 2019-09-06: qty 1

## 2019-09-06 MED ORDER — HYDROMORPHONE HCL 1 MG/ML IJ SOLN
0.2500 mg | INTRAMUSCULAR | Status: DC | PRN
  Administered 2019-09-06: 0.25 mg via INTRAVENOUS
  Filled 2019-09-06: qty 0.5

## 2019-09-06 MED ORDER — ACETAMINOPHEN 500 MG PO TABS
1000.0000 mg | ORAL_TABLET | Freq: Once | ORAL | Status: AC
  Administered 2019-09-06: 1000 mg via ORAL
  Filled 2019-09-06: qty 2

## 2019-09-06 MED ORDER — PROPOFOL 10 MG/ML IV BOLUS
INTRAVENOUS | Status: DC | PRN
  Administered 2019-09-06: 150 mg via INTRAVENOUS

## 2019-09-06 SURGICAL SUPPLY — 20 items
COVER WAND RF STERILE (DRAPES) ×4 IMPLANT
DRSG PAD ABDOMINAL 8X10 ST (GAUZE/BANDAGES/DRESSINGS) ×4 IMPLANT
GAUZE 4X4 16PLY RFD (DISPOSABLE) ×4 IMPLANT
GLOVE BIO SURGEON STRL SZ 6.5 (GLOVE) ×3 IMPLANT
GLOVE BIO SURGEON STRL SZ7.5 (GLOVE) ×8 IMPLANT
GLOVE BIO SURGEONS STRL SZ 6.5 (GLOVE) ×1
GLOVE BIOGEL PI IND STRL 6.5 (GLOVE) ×4 IMPLANT
GLOVE BIOGEL PI INDICATOR 6.5 (GLOVE) ×4
GOWN STRL REUS W/TWL LRG LVL3 (GOWN DISPOSABLE) ×8 IMPLANT
HOLDER FOLEY CATH W/STRAP (MISCELLANEOUS) ×4 IMPLANT
HOVERMATT SINGLE USE (MISCELLANEOUS) ×4 IMPLANT
IV NS 1000ML (IV SOLUTION) ×2
IV NS 1000ML BAXH (IV SOLUTION) ×2 IMPLANT
IV SET EXTENSION GRAVITY 40 LF (IV SETS) ×4 IMPLANT
KIT TURNOVER CYSTO (KITS) ×4 IMPLANT
PACK VAGINAL MINOR WOMEN LF (CUSTOM PROCEDURE TRAY) ×4 IMPLANT
PAD OB MATERNITY 4.3X12.25 (PERSONAL CARE ITEMS) ×4 IMPLANT
TOWEL OR 17X26 10 PK STRL BLUE (TOWEL DISPOSABLE) ×4 IMPLANT
TRAY FOLEY W/BAG SLVR 14FR (SET/KITS/TRAYS/PACK) ×4 IMPLANT
WATER STERILE IRR 500ML POUR (IV SOLUTION) ×4 IMPLANT

## 2019-09-06 NOTE — Addendum Note (Signed)
Encounter addended by: Loma Sousa, RN on: 09/06/2019 3:54 PM  Actions taken: Order list changed, Diagnosis association updated

## 2019-09-06 NOTE — Addendum Note (Signed)
Encounter addended by: Loma Sousa, RN on: 09/06/2019 4:13 PM  Actions taken: MAR administration accepted

## 2019-09-06 NOTE — Anesthesia Postprocedure Evaluation (Signed)
Anesthesia Post Note  Patient: Emma Medina  Procedure(s) Performed: TANDEM RING INSERTION (N/A Cervix) OPERATIVE ULTRASOUND (N/A Abdomen)     Patient location during evaluation: PACU Anesthesia Type: General Level of consciousness: awake and alert, patient cooperative and oriented Pain management: pain level controlled Vital Signs Assessment: post-procedure vital signs reviewed and stable Respiratory status: spontaneous breathing, nonlabored ventilation and respiratory function stable Cardiovascular status: blood pressure returned to baseline and stable Postop Assessment: no apparent nausea or vomiting and adequate PO intake Anesthetic complications: no    Last Vitals:  Vitals:   09/06/19 1030 09/06/19 1045  BP: (!) 145/100 (!) 146/88  Pulse: 68 62  Resp: 15 (!) 8  Temp:    SpO2: 98% 100%    Last Pain:  Vitals:   09/06/19 1045  TempSrc:   PainSc: 0-No pain                 Rosealynn Mateus,E. Lucca Ballo

## 2019-09-06 NOTE — Anesthesia Procedure Notes (Signed)
Procedure Name: LMA Insertion Date/Time: 09/06/2019 9:38 AM Performed by: Justice Rocher, CRNA Pre-anesthesia Checklist: Patient identified, Emergency Drugs available, Suction available and Patient being monitored Patient Re-evaluated:Patient Re-evaluated prior to induction Oxygen Delivery Method: Circle system utilized Preoxygenation: Pre-oxygenation with 100% oxygen Induction Type: IV induction Ventilation: Mask ventilation without difficulty LMA: LMA inserted LMA Size: 4.0 Number of attempts: 1 Airway Equipment and Method: Bite block Placement Confirmation: positive ETCO2 and breath sounds checked- equal and bilateral Tube secured with: Tape Dental Injury: Teeth and Oropharynx as per pre-operative assessment

## 2019-09-06 NOTE — Transfer of Care (Signed)
Immediate Anesthesia Transfer of Care Note  Patient: Emma Medina  Procedure(s) Performed: Procedure(s) (LRB): TANDEM RING INSERTION (N/A) OPERATIVE ULTRASOUND (N/A)  Patient Location: PACU  Anesthesia Type: General  Level of Consciousness: awake, sedated, patient cooperative and responds to stimulation  Airway & Oxygen Therapy: Patient Spontanous Breathing and Patient connected to [place] 02 and soft FM   Post-op Assessment: Report given to PACU RN, Post -op Vital signs reviewed and stable and Patient moving all extremities  Post vital signs: Reviewed and stable  Complications: No apparent anesthesia complications

## 2019-09-06 NOTE — Progress Notes (Signed)
  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  Identity was confirmed.  All relevant records and images related to the planned course of therapy were reviewed.  The patient freely provided informed written consent to proceed with treatment after reviewing the details related to the planned course of therapy. The consent form was witnessed and verified by the simulation staff.  Then, the patient was set-up in a stable reproducible  supine position for radiation therapy.  CT images were obtained.  Surface markings were placed.  The CT images were loaded into the planning software.  Then the target and avoidance structures were contoured.  Treatment planning then occurred.  The radiation prescription was entered and confirmed.   I have requested : Brachytherapy Isodose Plan and Dosimetry Calculations to plan the radiation distribution.    PLAN: The patient will receive 5.5 Gy in 1 fraction directed at the high risk clinical target volume. Iridium 192 will be the high-dose-rate source.  _________________________   Blair Promise, PhD, MD  This document serves as a record of services personally performed by Gery Pray, MD. It was created on his behalf by Wilburn Mylar, a trained medical scribe. The creation of this record is based on the scribe's personal observations and the provider's statements to them. This document has been checked and approved by the attending provider.

## 2019-09-06 NOTE — Interval H&P Note (Signed)
History and Physical Interval Note:  09/06/2019 9:24 AM  Emma Medina  has presented today for surgery, with the diagnosis of CERVICAL CANCER.  The various methods of treatment have been discussed with the patient and family. After consideration of risks, benefits and other options for treatment, the patient has consented to  Procedure(s): TANDEM RING INSERTION (N/A) OPERATIVE ULTRASOUND (N/A) as a surgical intervention.  The patient's history has been reviewed, patient examined, no change in status, stable for surgery.  I have reviewed the patient's chart and labs.  Questions were answered to the patient's satisfaction.     Gery Pray

## 2019-09-06 NOTE — Op Note (Signed)
09/06/2019  10:39 AM  PATIENT:  Emma Medina  48 y.o. female  PRE-OPERATIVE DIAGNOSIS:  CERVICAL CANCER  POST-OPERATIVE DIAGNOSIS:  CERVICAL CANCER  PROCEDURE:  Procedure(s): TANDEM RING INSERTION (N/A) OPERATIVE ULTRASOUND (N/A)  SURGEON:  Surgeon(s) and Role:    * Gery Pray, MD - Primary  PHYSICIAN ASSISTANT:   ASSISTANTS: none   ANESTHESIA:   general  EBL:  3 mL   BLOOD ADMINISTERED:none  DRAINS: Urinary Catheter (Foley)   LOCAL MEDICATIONS USED:  NONE  SPECIMEN:  No Specimen  DISPOSITION OF SPECIMEN:  N/A  COUNTS:  YES  TOURNIQUET:  * No tourniquets in log *  DICTATION: The patient was taken to the outpatient OR #1. Timeout was performedfor the procedure,preoperative antibiotics and preoperative medications. Patient was prepped and draped in the usual sterile fashion and placed in the dorsolithotomy position. Under sterile conditions a Foley catheter was placed. Exam under anesthesia revealed that the cervical mass was approximately 2.5 x 2.5 cm, softer on exam today. There is no parametrial extension noted. Patient proceeded to undergo dilation and sounding of the uterus. The uterus was anteverted and measured approximately 8.5 cm on sounding. Patient did have approximately 250 cc of sterile water placed in the bladder for imaging purposes. Excellent ultrasound imaging was obtained during the procedure. After dilation of the cervical osthe patient had a 60 mm 30 degree tandem placed within the endometrial canal. Placement was verified with axial and sagittal images. Prior to placement of the tandem the patient had a 60 mm cervical sleeve placed within the endocervical and endometrial canal. Patient then had a 30 degree ring with a small shielding cap placed at the region of the cervix and attached to the tandem. Patient then had placement of a rectal paddle to displace the rectum out of the high-dose radiation field. Patient tolerated procedure  well. After recovery in PACU the patient will then be taken to the radiation oncology department for planning and herfourthhigh-dose-rate treatment. The patient is scheduled to receive 5 brachytherapy procedures to complete her definitive course of radiation therapy.  PLAN OF CARE: Transferred to radiation oncology for planning and treatment  PATIENT DISPOSITION:  PACU - hemodynamically stable.   Delay start of Pharmacological VTE agent (>24hrs) due to surgical blood loss or risk of bleeding: not applicable

## 2019-09-06 NOTE — Anesthesia Postprocedure Evaluation (Signed)
Anesthesia Post Note  Patient: Emma Medina  Procedure(s) Performed: TANDEM RING INSERTION (N/A Vagina ) OPERATIVE ULTRASOUND (N/A Abdomen)     Patient location during evaluation: PACU Anesthesia Type: General Level of consciousness: awake and alert Pain management: pain level controlled Vital Signs Assessment: post-procedure vital signs reviewed and stable Respiratory status: spontaneous breathing, nonlabored ventilation, respiratory function stable and patient connected to nasal cannula oxygen Cardiovascular status: blood pressure returned to baseline and stable Postop Assessment: no apparent nausea or vomiting Anesthetic complications: no    Last Vitals:  Vitals:   08/29/19 0945 08/29/19 1000  BP: 137/88 116/84  Pulse: 69 69  Resp: (!) 22 (!) 25  Temp:    SpO2: 96% 95%    Last Pain:  Vitals:   09/04/19 1002  TempSrc:   PainSc: 0-No pain                 Daquann Merriott

## 2019-09-06 NOTE — Anesthesia Preprocedure Evaluation (Addendum)
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   breath sounds clear to auscultation       Cardiovascular hypertension (no BP meds), + Peripheral Vascular Disease   Rhythm:Regular Rate:Normal  '19 ECHO: EF 60-65%, valves OK   Neuro/Psych    GI/Hepatic negative GI ROS, Neg liver ROS,   Endo/Other  Morbid obesity  Renal/GU negative Renal ROS   Cervical cancer    Musculoskeletal   Abdominal (+) + obese,   Peds  Hematology Xarelto   Anesthesia Other Findings   Reproductive/Obstetrics                            Anesthesia Physical Anesthesia Plan  ASA: III  Anesthesia Plan: General   Post-op Pain Management:    Induction:   PONV Risk Score and Plan: 4 or greater and Dexamethasone, Ondansetron and Scopolamine patch - Pre-op  Airway Management Planned: LMA  Additional Equipment: None  Intra-op Plan:   Post-operative Plan:   Informed Consent: I have reviewed the patients History and Physical, chart, labs and discussed the procedure including the risks, benefits and alternatives for the proposed anesthesia with the patient or authorized representative who has indicated his/her understanding and acceptance.     Dental advisory given  Plan Discussed with: CRNA and Surgeon  Anesthesia Plan Comments:        Anesthesia Quick Evaluation

## 2019-09-06 NOTE — Progress Notes (Signed)
   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 Identity was confirmed. All relevant records and images related to the planned course of therapy were reviewed. The patient freely provided informed written consent to proceed with treatment after reviewing the details related to the planned course of therapy. The consent form was witnessed and verified by the simulation staff. Then, the patient was set-up in a stable reproducible supine position for radiation therapy. The tandem ring system was accessed and fiducial markers were placed within the tandem and ring.   Simple treatment device note: On the operating room the patient had construction of her custom tandem ring system. She will be treated with a 30 tandem/ring system. The patient had placement of a 60 mm tandem. A cervical ring with a small shielding was used for her treatment. A rectal paddle was also part of her custom set up device.  Verification simulation note: An AP and lateral film was obtained through the pelvis area. This was compared to the patient's planning films documenting accurate position of the tandem/ring system for treatment.  High-dose-rate brachytherapy treatment note:  The remote afterloading device was accessed through catheter system and attached to the tandem ring system. Patient then proceeded to undergo her fourth high-dose-rate treatment directed at the cervix. The patient was prescribed a dose of 5.5 gray to be delivered to the [place]. Patient was treated with 2 channels using 28 dwell positions. Treatment time was 604.4 seconds. The patient tolerated the procedure well. After completion of her therapy, a radiation  survey was performed documenting return of the iridium source into the GammaMed safe. The patient was then transferred to the nursing suite. She then had removal of the rectal paddle followed by the tandem and ring system. The patient tolerated the removal well with minimal bleeding.  PLAN: The patient will return next week for her final high-dose-rate treatment directed at the cervix. ________________________________    Blair Promise, PhD, MD  This document serves as a record of services personally performed by Gery Pray, MD. It was created on his behalf by Clerance Lav, a trained medical scribe. The creation of this record is based on the scribe's personal observations and the provider's statements to them. This document has been checked and approved by the attending provider.

## 2019-09-07 ENCOUNTER — Encounter (HOSPITAL_BASED_OUTPATIENT_CLINIC_OR_DEPARTMENT_OTHER): Payer: Self-pay | Admitting: Radiation Oncology

## 2019-09-07 ENCOUNTER — Other Ambulatory Visit: Payer: Self-pay

## 2019-09-07 NOTE — Progress Notes (Signed)
Spoke w/ via phone for pre-op interview---[facility] needs dos----      Urine poct        Lab results------ COVID test ------09-08-2019 Arrive at -------530 am 09-11-2019 NPO after ------misnight Medications to take morning of surgery -----none Diabetic medication -----n/a Patient Special Instructions ----- Pre-Op special Istructions ----- Patient verbalized understanding of instructions that were given at this phone interview. Patient denies shortness of breath, chest pain, fever, cough a this phone interview.

## 2019-09-08 ENCOUNTER — Other Ambulatory Visit (HOSPITAL_COMMUNITY)
Admit: 2019-09-08 | Discharge: 2019-09-08 | Disposition: A | Discharge status: Home / Self Care | Payer: BLUE CROSS/BLUE SHIELD | Source: Ambulatory Visit | Attending: Radiation Oncology | Admitting: Radiation Oncology

## 2019-09-08 DIAGNOSIS — Z01812 Encounter for preprocedural laboratory examination: Secondary | ICD-10-CM | POA: Insufficient documentation

## 2019-09-08 DIAGNOSIS — Z20828 Contact with and (suspected) exposure to other viral communicable diseases: Secondary | ICD-10-CM | POA: Insufficient documentation

## 2019-09-08 LAB — SARS CORONAVIRUS 2 (TAT 6-24 HRS): SARS Coronavirus 2: NEGATIVE

## 2019-09-11 ENCOUNTER — Encounter (HOSPITAL_BASED_OUTPATIENT_CLINIC_OR_DEPARTMENT_OTHER)
Admission: RE | Disposition: A | Discharge status: Other Acute Inpatient Hospital | Payer: Self-pay | Source: Home / Self Care | Attending: Radiation Oncology

## 2019-09-11 ENCOUNTER — Encounter: Payer: Self-pay | Admitting: Radiation Oncology

## 2019-09-11 ENCOUNTER — Ambulatory Visit
Admission: RE | Admit: 2019-09-11 | Discharge: 2019-09-11 | Disposition: A | Discharge status: Home / Self Care | Payer: BLUE CROSS/BLUE SHIELD | Source: Ambulatory Visit | Attending: Radiation Oncology | Admitting: Radiation Oncology

## 2019-09-11 ENCOUNTER — Ambulatory Visit (HOSPITAL_COMMUNITY)
Admission: RE | Admit: 2019-09-11 | Discharge: 2019-09-11 | Disposition: A | Discharge status: Home / Self Care | Payer: BLUE CROSS/BLUE SHIELD | Source: Ambulatory Visit | Attending: Radiation Oncology | Admitting: Radiation Oncology

## 2019-09-11 ENCOUNTER — Ambulatory Visit (HOSPITAL_BASED_OUTPATIENT_CLINIC_OR_DEPARTMENT_OTHER): Payer: BLUE CROSS/BLUE SHIELD | Admitting: Anesthesiology

## 2019-09-11 ENCOUNTER — Encounter (HOSPITAL_BASED_OUTPATIENT_CLINIC_OR_DEPARTMENT_OTHER): Payer: Self-pay | Admitting: Emergency Medicine

## 2019-09-11 ENCOUNTER — Other Ambulatory Visit: Payer: Self-pay

## 2019-09-11 ENCOUNTER — Ambulatory Visit (HOSPITAL_BASED_OUTPATIENT_CLINIC_OR_DEPARTMENT_OTHER)
Admission: RE | Admit: 2019-09-11 | Discharge: 2019-09-11 | Disposition: A | Discharge status: Other Acute Inpatient Hospital | Payer: BLUE CROSS/BLUE SHIELD | Attending: Radiation Oncology | Admitting: Radiation Oncology

## 2019-09-11 DIAGNOSIS — I10 Essential (primary) hypertension: Secondary | ICD-10-CM | POA: Diagnosis not present

## 2019-09-11 DIAGNOSIS — Z6837 Body mass index (BMI) 37.0-37.9, adult: Secondary | ICD-10-CM | POA: Diagnosis not present

## 2019-09-11 DIAGNOSIS — Z8541 Personal history of malignant neoplasm of cervix uteri: Secondary | ICD-10-CM

## 2019-09-11 DIAGNOSIS — Z87891 Personal history of nicotine dependence: Secondary | ICD-10-CM | POA: Insufficient documentation

## 2019-09-11 DIAGNOSIS — Z7901 Long term (current) use of anticoagulants: Secondary | ICD-10-CM | POA: Diagnosis not present

## 2019-09-11 DIAGNOSIS — Z7982 Long term (current) use of aspirin: Secondary | ICD-10-CM | POA: Insufficient documentation

## 2019-09-11 DIAGNOSIS — Z79899 Other long term (current) drug therapy: Secondary | ICD-10-CM | POA: Insufficient documentation

## 2019-09-11 DIAGNOSIS — C539 Malignant neoplasm of cervix uteri, unspecified: Secondary | ICD-10-CM | POA: Insufficient documentation

## 2019-09-11 DIAGNOSIS — C538 Malignant neoplasm of overlapping sites of cervix uteri: Secondary | ICD-10-CM

## 2019-09-11 DIAGNOSIS — Z86718 Personal history of other venous thrombosis and embolism: Secondary | ICD-10-CM | POA: Insufficient documentation

## 2019-09-11 HISTORY — PX: OPERATIVE ULTRASOUND: SHX5996

## 2019-09-11 HISTORY — PX: TANDEM RING INSERTION: SHX6199

## 2019-09-11 SURGERY — INSERTION, UTERINE TANDEM AND RING OR CYLINDER, FOR BRACHYTHERAPY
Anesthesia: General | Site: Vagina

## 2019-09-11 MED ORDER — DEXAMETHASONE SODIUM PHOSPHATE 10 MG/ML IJ SOLN
INTRAMUSCULAR | Status: DC | PRN
  Administered 2019-09-11: 4 mg via INTRAVENOUS

## 2019-09-11 MED ORDER — PROPOFOL 10 MG/ML IV BOLUS
INTRAVENOUS | Status: DC | PRN
  Administered 2019-09-11: 200 mg via INTRAVENOUS

## 2019-09-11 MED ORDER — LIDOCAINE 2% (20 MG/ML) 5 ML SYRINGE
INTRAMUSCULAR | Status: AC
  Filled 2019-09-11: qty 5

## 2019-09-11 MED ORDER — FENTANYL CITRATE (PF) 100 MCG/2ML IJ SOLN
INTRAMUSCULAR | Status: AC
  Filled 2019-09-11: qty 2

## 2019-09-11 MED ORDER — LIDOCAINE 2% (20 MG/ML) 5 ML SYRINGE
INTRAMUSCULAR | Status: DC | PRN
  Administered 2019-09-11: 100 mg via INTRAVENOUS

## 2019-09-11 MED ORDER — LORAZEPAM 0.5 MG PO TABS
0.5000 mg | ORAL_TABLET | Freq: Once | ORAL | Status: AC
  Administered 2019-09-11: 0.5 mg via ORAL
  Filled 2019-09-11: qty 1

## 2019-09-11 MED ORDER — HYDROMORPHONE HCL 1 MG/ML IJ SOLN
1.0000 mg | Freq: Once | INTRAMUSCULAR | Status: AC
  Administered 2019-09-11: 1 mg via INTRAVENOUS
  Filled 2019-09-11: qty 1

## 2019-09-11 MED ORDER — MIDAZOLAM HCL 2 MG/2ML IJ SOLN
INTRAMUSCULAR | Status: AC
  Filled 2019-09-11: qty 2

## 2019-09-11 MED ORDER — PROPOFOL 10 MG/ML IV BOLUS
INTRAVENOUS | Status: AC
  Filled 2019-09-11: qty 20

## 2019-09-11 MED ORDER — FENTANYL CITRATE (PF) 100 MCG/2ML IJ SOLN
25.0000 ug | INTRAMUSCULAR | Status: DC | PRN
  Administered 2019-09-11: 09:00:00 25 ug via INTRAVENOUS
  Administered 2019-09-11: 09:00:00 50 ug via INTRAVENOUS
  Administered 2019-09-11: 08:00:00 25 ug via INTRAVENOUS
  Filled 2019-09-11: qty 1

## 2019-09-11 MED ORDER — ACETAMINOPHEN 500 MG PO TABS
ORAL_TABLET | ORAL | Status: AC
  Filled 2019-09-11: qty 2

## 2019-09-11 MED ORDER — HYDROMORPHONE HCL 1 MG/ML IJ SOLN
0.5000 mg | Freq: Once | INTRAMUSCULAR | Status: AC
  Administered 2019-09-11: 0.5 mg via INTRAVENOUS
  Filled 2019-09-11: qty 1

## 2019-09-11 MED ORDER — FENTANYL CITRATE (PF) 100 MCG/2ML IJ SOLN
INTRAMUSCULAR | Status: DC | PRN
  Administered 2019-09-11: 25 ug via INTRAVENOUS
  Administered 2019-09-11: 50 ug via INTRAVENOUS
  Administered 2019-09-11: 25 ug via INTRAVENOUS

## 2019-09-11 MED ORDER — ACETAMINOPHEN 500 MG PO TABS
1000.0000 mg | ORAL_TABLET | Freq: Once | ORAL | Status: AC
  Administered 2019-09-11: 07:00:00 1000 mg via ORAL
  Filled 2019-09-11: qty 2

## 2019-09-11 MED ORDER — ONDANSETRON HCL 4 MG/2ML IJ SOLN
INTRAMUSCULAR | Status: AC
  Filled 2019-09-11: qty 2

## 2019-09-11 MED ORDER — DEXAMETHASONE SODIUM PHOSPHATE 10 MG/ML IJ SOLN
INTRAMUSCULAR | Status: AC
  Filled 2019-09-11: qty 1

## 2019-09-11 MED ORDER — LACTATED RINGERS IV SOLN
INTRAVENOUS | Status: DC
  Administered 2019-09-11: 10:00:00 via INTRAVENOUS
  Filled 2019-09-11 (×2): qty 250

## 2019-09-11 MED ORDER — MIDAZOLAM HCL 2 MG/2ML IJ SOLN
INTRAMUSCULAR | Status: DC | PRN
  Administered 2019-09-11: 2 mg via INTRAVENOUS

## 2019-09-11 MED ORDER — ONDANSETRON HCL 4 MG/2ML IJ SOLN
INTRAMUSCULAR | Status: DC | PRN
  Administered 2019-09-11: 4 mg via INTRAVENOUS

## 2019-09-11 MED ORDER — SODIUM CHLORIDE 0.9 % IR SOLN
Status: DC | PRN
  Administered 2019-09-11: 200 mL via INTRAVESICAL

## 2019-09-11 MED ORDER — LACTATED RINGERS IV SOLN
INTRAVENOUS | Status: DC
  Administered 2019-09-11: 06:00:00 via INTRAVENOUS
  Filled 2019-09-11: qty 1000

## 2019-09-11 SURGICAL SUPPLY — 19 items
BNDG CONFORM 2 STRL LF (GAUZE/BANDAGES/DRESSINGS) IMPLANT
COVER WAND RF STERILE (DRAPES) ×4 IMPLANT
DILATOR CANAL MILEX (MISCELLANEOUS) IMPLANT
DRSG PAD ABDOMINAL 8X10 ST (GAUZE/BANDAGES/DRESSINGS) ×4 IMPLANT
GAUZE 4X4 16PLY RFD (DISPOSABLE) ×4 IMPLANT
GLOVE BIO SURGEON STRL SZ7.5 (GLOVE) ×12 IMPLANT
GOWN STRL REUS W/TWL LRG LVL3 (GOWN DISPOSABLE) ×4 IMPLANT
HOLDER FOLEY CATH W/STRAP (MISCELLANEOUS) ×4 IMPLANT
HOVERMATT SINGLE USE (MISCELLANEOUS) ×4 IMPLANT
IV NS 1000ML (IV SOLUTION) ×2
IV NS 1000ML BAXH (IV SOLUTION) ×2 IMPLANT
IV SET EXTENSION GRAVITY 40 LF (IV SETS) ×4 IMPLANT
KIT TURNOVER CYSTO (KITS) ×4 IMPLANT
PACK VAGINAL MINOR WOMEN LF (CUSTOM PROCEDURE TRAY) ×4 IMPLANT
PACKING VAGINAL (PACKING) IMPLANT
PAD OB MATERNITY 4.3X12.25 (PERSONAL CARE ITEMS) IMPLANT
TOWEL OR 17X26 10 PK STRL BLUE (TOWEL DISPOSABLE) ×4 IMPLANT
TRAY FOLEY W/BAG SLVR 14FR (SET/KITS/TRAYS/PACK) ×4 IMPLANT
WATER STERILE IRR 500ML POUR (IV SOLUTION) ×4 IMPLANT

## 2019-09-11 NOTE — Anesthesia Procedure Notes (Signed)
Procedure Name: LMA Insertion Date/Time: 09/11/2019 7:32 AM Performed by: Suan Halter, CRNA Pre-anesthesia Checklist: Patient identified, Emergency Drugs available, Suction available and Patient being monitored Patient Re-evaluated:Patient Re-evaluated prior to induction Oxygen Delivery Method: Circle system utilized Preoxygenation: Pre-oxygenation with 100% oxygen Induction Type: IV induction Ventilation: Mask ventilation without difficulty LMA: LMA inserted LMA Size: 4.0 Number of attempts: 1 Airway Equipment and Method: Bite block Placement Confirmation: positive ETCO2 Tube secured with: Tape Dental Injury: Teeth and Oropharynx as per pre-operative assessment

## 2019-09-11 NOTE — Anesthesia Postprocedure Evaluation (Signed)
Anesthesia Post Note  Patient: Emma Medina  Procedure(s) Performed: TANDEM RING INSERTION (N/A Vagina ) OPERATIVE ULTRASOUND (N/A Abdomen)     Patient location during evaluation: PACU Anesthesia Type: General Level of consciousness: awake and alert Pain management: pain level controlled Vital Medina Assessment: post-procedure vital Medina reviewed and stable Respiratory status: spontaneous breathing, nonlabored ventilation, respiratory function stable and patient connected to nasal cannula oxygen Cardiovascular status: blood pressure returned to baseline and stable Postop Assessment: no apparent nausea or vomiting Anesthetic complications: no    Last Vitals:  Vitals:   09/11/19 0845 09/11/19 0900  BP: (!) 129/91 122/88  Pulse: 81 90  Resp: (!) 22 16  Temp:    SpO2: 96% 93%    Last Pain:  Vitals:   09/11/19 0851  TempSrc:   PainSc: 6                  Chelsey L Woodrum

## 2019-09-11 NOTE — Progress Notes (Signed)
  Radiation Oncology         [phone]) [phone] ________________________________  Name: Emma Medina MRN: [id number]  Date: 09/11/2019  DOB: 1970/12/18  CC: Marliss Coots, NP  Placey, Audrea Muscat, NP  HDR BRACHYTHERAPY NOTE  DIAGNOSIS: FIGO Stage IB3 Squamous Cell Carcinoma of the Cervix  NARRATIVE: The patient was brought to the HDR suite. Identity was confirmed. All relevant records and images related to the planned course of therapy were reviewed. The patient freely provided informed written consent to proceed with treatment after reviewing the details related to the planned course of therapy. The consent form was witnessed and verified by the simulation staff. Then, the patient was set-up in a stable reproducible supine position for radiation therapy. The tandem ring system was accessed and fiducial markers were placed within the tandem and ring.   Simple treatment device note: On the operating room the patient had construction of her custom tandem ring system. She will be treated with a 30 tandem/ring system. The patient had placement of a 60 mm tandem. A cervical ring with a small shielding was used for her treatment. A rectal paddle was also part of her custom set up device.  Verification simulation note: An AP and lateral film was obtained through the pelvis area. This was compared to the patient's planning films documenting accurate position of the tandem/ring system for treatment.  High-dose-rate brachytherapy treatment note:  The remote afterloading device was accessed through catheter system and attached to the tandem ring system. Patient then proceeded to undergo her fifth high-dose-rate treatment directed at the cervix. The patient was prescribed a dose of 5.5 gray to be delivered to the [place] Patient was treated with 2 channels using 24 dwell positions. Treatment time was 507.6 seconds. The patient tolerated the procedure well. After completion of her therapy, a radiation survey  was performed documenting return of the iridium source into the GammaMed safe. The patient was then transferred to the nursing suite. She then had removal of the rectal paddle followed by the tandem and ring system. The patient tolerated the removal well.  PLAN: The patient has completed her definitive course of radiation therapy.  She will be scheduled for routine follow-up in 1 month. ________________________________  Blair Promise, PhD, MD

## 2019-09-11 NOTE — Progress Notes (Signed)
  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  Identity was confirmed.  All relevant records and images related to the planned course of therapy were reviewed.  The patient freely provided informed written consent to proceed with treatment after reviewing the details related to the planned course of therapy. The consent form was witnessed and verified by the simulation staff.  Then, the patient was set-up in a stable reproducible  supine position for radiation therapy.  CT images were obtained.  Surface markings were placed.  The CT images were loaded into the planning software.  Then the target and avoidance structures were contoured.  Treatment planning then occurred.  The radiation prescription was entered and confirmed.   I have requested : Brachytherapy Isodose Plan and Dosimetry Calculations to plan the radiation distribution.    PLAN:  The patient will receive 5.5 Gy in 1 fraction directed at the high risk clinical target volume patient will be treated with iridium 192 is the high-dose-rate source.  The tandem ring system will be used to deliver the patient's treatment.    ________________________________  Blair Promise, PhD, MD

## 2019-09-11 NOTE — Op Note (Signed)
09/11/2019  9:00 AM  PATIENT:  Emma Medina  48 y.o. female  PRE-OPERATIVE DIAGNOSIS:  CERVICAL CANCER  POST-OPERATIVE DIAGNOSIS:  CERVICAL CANCER  PROCEDURE:  Procedure(s): TANDEM RING INSERTION (N/A) OPERATIVE ULTRASOUND (N/A)  SURGEON:  Surgeon(s) and Role:    * Gery Pray, MD - Primary  PHYSICIAN ASSISTANT:   ASSISTANTS: none   ANESTHESIA:   general  EBL:  3 mL  BLOOD ADMINISTERED:none  DRAINS: Urinary Catheter (Foley)   LOCAL MEDICATIONS USED:  NONE  SPECIMEN:  No Specimen  DISPOSITION OF SPECIMEN:  N/A  COUNTS:  YES  TOURNIQUET:  * No tourniquets in log *  DICTATION: The patient was taken to the outpatient OR #1. Timeout was performedfor the procedure,preoperative antibiotics and preoperative medications. Patient was prepped and draped in the usual sterile fashion and placed in the dorsolithotomy position. Under sterile conditions a Foley catheter was placed. Exam under anesthesia revealed that the cervical mass was approximately 2.5 x 2.5 cm, softer on exam today. There is no parametrial extension noted. Patient proceeded to undergo dilation and sounding of the uterus. The uterus was anteverted and measured approximately 8.5 cm on sounding. Patient did have approximately 250 cc of sterile water placed in the bladder for imaging purposes. Excellent ultrasound imaging was obtained during the procedure. After dilation of the cervical osthe patient had a 60 mm 30 degree tandem placed within the endometrial canal. Placement was verified with axial and sagittal images. Prior to placement of the tandem the patient had a 60 mm cervical sleeve placed within the endocervical and endometrial canal. Patient then had a 30 degree ring with a small shielding cap placed at the region of the cervix and attached to the tandem. Patient then had placement of a rectal paddle to displace the rectum out of the high-dose radiation field. Patient tolerated procedure well.  After recovery in PACU the patient will then be taken to the radiation oncology department for planning and herfifth and finalhigh-dose-rate treatment.   PLAN OF CARE: Transferred to radiation oncology for planning and treatment  PATIENT DISPOSITION:  PACU - hemodynamically stable.   Delay start of Pharmacological VTE agent (>24hrs) due to surgical blood loss or risk of bleeding: not applicable

## 2019-09-11 NOTE — Anesthesia Preprocedure Evaluation (Addendum)
Anesthesia Evaluation  Patient identified by MRN, date of birth, ID band Patient awake    Reviewed: Allergy & Precautions, NPO status , Patient's Chart, lab work & pertinent test results  Airway Mallampati: II  TM Distance: >3 FB Neck ROM: Full  Mouth opening: Limited Mouth Opening  Dental no notable dental hx. (+) Edentulous Upper   Pulmonary neg pulmonary ROS, former smoker,    Pulmonary exam normal breath sounds clear to auscultation       Cardiovascular hypertension, + DVT (on xarelto)  Normal cardiovascular exam Rhythm:Regular Rate:Normal  TTE 2019 - Normal LV size with EF 60-65%. Moderate diastolic dysfunction. Normal RV size and systolic function. No significant valvular abnormalities.   Neuro/Psych negative neurological ROS  negative psych ROS   GI/Hepatic negative GI ROS, (+)     substance abuse  marijuana use,   Endo/Other  Morbid obesity  Renal/GU negative Renal ROS  negative genitourinary   Musculoskeletal negative musculoskeletal ROS (+)   Abdominal   Peds  Hematology negative hematology ROS (+)   Anesthesia Other Findings Cervical CA  Reproductive/Obstetrics                            Anesthesia Physical Anesthesia Plan  ASA: III  Anesthesia Plan: General   Post-op Pain Management:    Induction: Intravenous  PONV Risk Score and Plan: 3 and Ondansetron, Dexamethasone and Midazolam  Airway Management Planned: LMA  Additional Equipment:   Intra-op Plan:   Post-operative Plan: Extubation in OR  Informed Consent: I have reviewed the patients History and Physical, chart, labs and discussed the procedure including the risks, benefits and alternatives for the proposed anesthesia with the patient or authorized representative who has indicated his/her understanding and acceptance.     Dental advisory given  Plan Discussed with: CRNA  Anesthesia Plan Comments:          Anesthesia Quick Evaluation

## 2019-09-11 NOTE — Discharge Instructions (Signed)

## 2019-09-11 NOTE — Interval H&P Note (Signed)
History and Physical Interval Note:  09/11/2019 7:24 AM  Emma Medina  has presented today for surgery, with the diagnosis of CERVICAL CANCER.  The various methods of treatment have been discussed with the patient and family. After consideration of risks, benefits and other options for treatment, the patient has consented to  Procedure(s): TANDEM RING INSERTION (N/A) OPERATIVE ULTRASOUND (N/A) as a surgical intervention.  The patient's history has been reviewed, patient examined, no change in status, stable for surgery.  I have reviewed the patient's chart and labs.  Questions were answered to the patient's satisfaction.     Gery Pray

## 2019-09-11 NOTE — Progress Notes (Signed)
Pt was received from [facility] OP PACU and transported to [facility] in Radiation Oncology. Pt then received to nursing room 1 to rest. IVF infusing without difficulty via PIV. Foley patent and draining clear yellow urine. VSS. Pt able to take PO without difficulty. Pt did c/o pain/cramping that was "worse this time around". See MAR for medication administration. After HDR treatment completed, foley removed per this RN without difficulty, PIV removed per this RN with catheter tip intact. Tandem and Ring device was removed per Dr. Sondra Come with good pt tolerance. Pt was assisted with cleaning and dressing. Pt was assisted to personal vehicle/family member via [place]. Pt able to verbalize discharge instructions. Loma Sousa, RN BSN

## 2019-09-11 NOTE — Transfer of Care (Signed)
Immediate Anesthesia Transfer of Care Note  Patient: Emma Medina  Procedure(s) Performed: Procedure(s) (LRB): TANDEM RING INSERTION (N/A) OPERATIVE ULTRASOUND (N/A)  Patient Location: PACU  Anesthesia Type: General  Level of Consciousness: awake, oriented, sedated and patient cooperative  Airway & Oxygen Therapy: Patient Spontanous Breathing and Patient connected to face mask oxygen  Post-op Assessment: Report given to PACU RN and Post -op Vital signs reviewed and stable  Post vital signs: Reviewed and stable  Complications: No apparent anesthesia complications Last Vitals:  Vitals Value Taken Time  BP 132/106 09/11/19 0818  Temp    Pulse 78 09/11/19 0819  Resp 16 09/11/19 0819  SpO2 98 % 09/11/19 0819  Vitals shown include unvalidated device data.  Last Pain:  Vitals:   09/11/19 0621  TempSrc: Oral      Patients Stated Pain Goal: 6 (09/11/19 [id number])

## 2019-09-12 ENCOUNTER — Encounter (HOSPITAL_BASED_OUTPATIENT_CLINIC_OR_DEPARTMENT_OTHER): Payer: Self-pay | Admitting: Radiation Oncology

## 2019-09-18 ENCOUNTER — Ambulatory Visit (HOSPITAL_COMMUNITY): Payer: Self-pay

## 2019-10-01 ENCOUNTER — Inpatient Hospital Stay: Payer: BLUE CROSS/BLUE SHIELD

## 2019-10-01 ENCOUNTER — Other Ambulatory Visit: Payer: Self-pay

## 2019-10-01 ENCOUNTER — Inpatient Hospital Stay: Payer: BLUE CROSS/BLUE SHIELD | Attending: Gynecologic Oncology | Admitting: Hematology and Oncology

## 2019-10-01 ENCOUNTER — Encounter: Payer: Self-pay | Admitting: Hematology and Oncology

## 2019-10-01 ENCOUNTER — Telehealth: Payer: Self-pay

## 2019-10-01 VITALS — BP 117/91 | HR 72 | Temp 98.5°F | Resp 18 | Ht 66.0 in | Wt 241.8 lb

## 2019-10-01 DIAGNOSIS — Z9221 Personal history of antineoplastic chemotherapy: Secondary | ICD-10-CM | POA: Diagnosis not present

## 2019-10-01 DIAGNOSIS — C539 Malignant neoplasm of cervix uteri, unspecified: Secondary | ICD-10-CM | POA: Insufficient documentation

## 2019-10-01 DIAGNOSIS — G620 Drug-induced polyneuropathy: Secondary | ICD-10-CM | POA: Insufficient documentation

## 2019-10-01 DIAGNOSIS — E876 Hypokalemia: Secondary | ICD-10-CM | POA: Diagnosis not present

## 2019-10-01 DIAGNOSIS — Z86718 Personal history of other venous thrombosis and embolism: Secondary | ICD-10-CM | POA: Diagnosis not present

## 2019-10-01 DIAGNOSIS — C538 Malignant neoplasm of overlapping sites of cervix uteri: Secondary | ICD-10-CM

## 2019-10-01 DIAGNOSIS — R197 Diarrhea, unspecified: Secondary | ICD-10-CM | POA: Diagnosis not present

## 2019-10-01 DIAGNOSIS — D61818 Other pancytopenia: Secondary | ICD-10-CM | POA: Diagnosis not present

## 2019-10-01 DIAGNOSIS — Z7901 Long term (current) use of anticoagulants: Secondary | ICD-10-CM | POA: Insufficient documentation

## 2019-10-01 DIAGNOSIS — Z7982 Long term (current) use of aspirin: Secondary | ICD-10-CM | POA: Insufficient documentation

## 2019-10-01 DIAGNOSIS — T451X5D Adverse effect of antineoplastic and immunosuppressive drugs, subsequent encounter: Secondary | ICD-10-CM | POA: Diagnosis not present

## 2019-10-01 DIAGNOSIS — Z79899 Other long term (current) drug therapy: Secondary | ICD-10-CM | POA: Diagnosis not present

## 2019-10-01 DIAGNOSIS — T451X5A Adverse effect of antineoplastic and immunosuppressive drugs, initial encounter: Secondary | ICD-10-CM | POA: Insufficient documentation

## 2019-10-01 LAB — BASIC METABOLIC PANEL - CANCER CENTER ONLY
Anion gap: 9 (ref 5–15)
BUN: 7 mg/dL (ref 6–20)
CO2: 25 mmol/L (ref 22–32)
Calcium: 8.5 mg/dL — ABNORMAL LOW (ref 8.9–10.3)
Chloride: 108 mmol/L (ref 98–111)
Creatinine: 0.83 mg/dL (ref 0.44–1.00)
GFR, Est AFR Am: >60 mL/min (ref >60)
GFR, Estimated: >60 mL/min (ref >60)
Glucose, Bld: 139 mg/dL — ABNORMAL HIGH (ref 70–99)
Potassium: 3.3 mmol/L — ABNORMAL LOW (ref 3.5–5.1)
Sodium: 142 mmol/L (ref 135–145)

## 2019-10-01 LAB — CBC WITH DIFFERENTIAL (CANCER CENTER ONLY)
Abs Immature Granulocytes: 0.02 10*3/uL (ref 0.00–0.07)
Basophils Absolute: 0 10*3/uL (ref 0.0–0.1)
Basophils Relative: 1 %
Eosinophils Absolute: 0.2 10*3/uL (ref 0.0–0.5)
Eosinophils Relative: 6 %
HCT: 29.8 % — ABNORMAL LOW (ref 36.0–46.0)
Hemoglobin: 10 g/dL — ABNORMAL LOW (ref 12.0–15.0)
Immature Granulocytes: 1 %
Lymphocytes Relative: 20 %
Lymphs Abs: 0.7 10*3/uL (ref 0.7–4.0)
MCH: 29.6 pg (ref 26.0–34.0)
MCHC: 33.6 g/dL (ref 30.0–36.0)
MCV: 88.2 fL (ref 80.0–100.0)
Monocytes Absolute: 0.5 10*3/uL (ref 0.1–1.0)
Monocytes Relative: 15 %
Neutro Abs: 2 10*3/uL (ref 1.7–7.7)
Neutrophils Relative %: 57 %
Platelet Count: 226 10*3/uL (ref 150–400)
RBC: 3.38 MIL/uL — ABNORMAL LOW (ref 3.87–5.11)
RDW: 16.5 % — ABNORMAL HIGH (ref 11.5–15.5)
WBC Count: 3.4 10*3/uL — ABNORMAL LOW (ref 4.0–10.5)
nRBC: 0 % (ref 0.0–0.2)

## 2019-10-01 LAB — MAGNESIUM: Magnesium: 1.9 mg/dL (ref 1.7–2.4)

## 2019-10-01 MED ORDER — HEPARIN SOD (PORK) LOCK FLUSH 100 UNIT/ML IV SOLN
500.0000 [IU] | Freq: Once | INTRAVENOUS | Status: AC
  Administered 2019-10-01: 500 [IU]
  Filled 2019-10-01: qty 5

## 2019-10-01 MED ORDER — SODIUM CHLORIDE 0.9% FLUSH
10.0000 mL | Freq: Once | INTRAVENOUS | Status: AC
  Administered 2019-10-01: 10 mL
  Filled 2019-10-01: qty 10

## 2019-10-01 NOTE — Telephone Encounter (Signed)
Called and given below message. She verbalized understanding. Reviewed Potassium rich food list with her.

## 2019-10-01 NOTE — Assessment & Plan Note (Signed)
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

## 2019-10-01 NOTE — Assessment & Plan Note (Signed)
She has mild pancytopenia likely secondary to recent treatment She is not symptomatic Observe only for now

## 2019-10-01 NOTE — Progress Notes (Signed)
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 [facility]) She is almost completely recovered from side effects of recent treatment She have mild residual neuropathy but not severe and I recommend close observation for now I recommend port flushes and PET CT scan next year and she agreed I will see her back after the PET scan result is available  Pancytopenia, acquired [facility]) She has mild pancytopenia likely secondary to recent treatment She is not symptomatic Observe only for now  Peripheral neuropathy due to chemotherapy [facility]) She has mild neuropathy from recent treatment It is not causing pain I recommend close observation only for now  Diarrhea Her diarrhea is improving and likely the cause of mild hypokalemia She will continue to take Imodium as needed and potassium rich diet   Orders Placed This Encounter  Procedures  . NM PET Image Restag (PS) Skull Base To Thigh    Standing Status:   Future    Standing Expiration Date:   09/30/2020    Order Specific Question:   If indicated for the ordered procedure, I authorize the administration of a radiopharmaceutical per Radiology protocol    Answer:   Yes    Order Specific Question:   Preferred imaging location?    Answer:   [facility]    Order Specific Question:   Radiology Contrast Protocol - do NOT remove file path    Answer:   [email or web address]    Order Specific Question:   Is the patient pregnant?    Answer:   No    INTERVAL HISTORY: Please see below for problem oriented charting. 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  She was placed on Xarelto and aspirin for following this procedure.  She had already been experiencing some irregular menses, however after commencing anticoagulant therapy her menstrual cycle became even heavier and more irregular with daily bleeding   09/20/2018 Imaging   US pelvis Negative aside from subserosal appearing 2.5 centimeter fibroid at the right fundus.   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 No evidence of thoracic metastasis. 2. Small RIGHT upper lobe pulmonary nodule favored benign.   Abdomen / Pelvis Impression:   1. No direct extension beyond the myometrium of the uterus, lower uterine segment or cervix. 2. No clear evidence metastatic adenopathy in the pelvis. Several subcentimeter iliac lymph nodes are noted. 3. No evidence distant metastatic disease or adenopathy in the abdomen pelvis. 4. Uterus  is poorly evaluated by CT. Probable leiomyoma in the anterior wall. 5. No skeletal metastasis.   06/08/2019 Imaging   MRI pelvis 4.4 cm barrel shaped soft tissue mass in the cervix, consistent with known cervical carcinoma. No evidence of parametrial involvement or other pelvic metastatic disease. FIGO stage IB3 by imaging.   Mild  hydrometros.   Small uterine fibroids and adenomyosis.   Normal appearance of both ovaries.  No adnexal mass identified.     06/28/2019 Cancer Staging   Staging form: Cervix Uteri, AJCC 8th Edition - Clinical: FIGO Stage IB1 (cT1b1, cN0, cM0) - Signed by Heath Lark, MD on 06/28/2019   06/28/2019 PET scan   1. Intense hypermetabolic lesion within the lower uterine segment/cervix consistent with known cervical carcinoma. No evidence of extension beyond the myometrium. 2. No hypermetabolic pelvic lymphadenopathy or periaortic retroperitoneal adenopathy. 3. No evidence of distant metastatic disease or skeletal disease.       07/05/2019 Procedure   Successful placement of a right internal jugular approach power injectable Port-A-Cath. The catheter is ready for immediate use.      Genetic Testing   Patient has genetic testing done for PD-L1 on 06/01/19 biopsy. Results revealed: PD-L1 0%    07/10/2019 -  Chemotherapy   The patient had cisplatin for chemotherapy treatment.       REVIEW OF SYSTEMS:   Constitutional: Denies fevers, chills or abnormal weight loss Eyes: Denies blurriness of vision Ears, nose, mouth, throat, and face: Denies mucositis or sore throat Respiratory: Denies cough, dyspnea or wheezes Cardiovascular: Denies palpitation, chest discomfort or lower extremity swelling Skin: Denies abnormal skin rashes Lymphatics: Denies new lymphadenopathy or easy bruising Behavioral/Psych: Mood is stable, no new changes  All other systems were reviewed with the patient and are negative.  I have reviewed the past medical history, past surgical history, social history and family history with the patient and they are unchanged from previous note.  ALLERGIES:  has No Known Allergies.  MEDICATIONS:  Current Outpatient Medications  Medication Sig Dispense Refill  . aspirin EC 81 MG EC tablet Take 1 tablet (81 mg total) by mouth daily.    . cyclobenzaprine (FLEXERIL) 10 MG tablet  Take 10 mg by mouth daily.     Marland Kitchen lidocaine-prilocaine (EMLA) cream Apply to affected area once 30 g 3  . magnesium oxide (MAG-OX) 400 (241.3 Mg) MG tablet Take 1 tablet (400 mg total) by mouth 2 (two) times daily. 60 tablet 11  . ondansetron (ZOFRAN) 8 MG tablet Take 1 tablet (8 mg total) by mouth every 8 (eight) hours as needed. Start on the third day after chemotherapy. 30 tablet 1  . phenazopyridine (PYRIDIUM) 200 MG tablet Take 1 tablet (200 mg total) by mouth 3 (three) times daily as needed for pain. 25 tablet 1  . prochlorperazine (COMPAZINE) 10 MG tablet Take 1 tablet (10 mg total) by mouth every 6 (six) hours as needed (Nausea or vomiting). 30 tablet 1  . traMADol (ULTRAM) 50 MG tablet Take 2 tablets (100 mg total) by mouth every 6 (six) hours as needed. 60 tablet 0  . XARELTO 20 MG TABS tablet Take 1 tablet by mouth once daily 30 tablet 6   No current facility-administered medications for this visit.    PHYSICAL EXAMINATION: ECOG PERFORMANCE STATUS: 1 - Symptomatic but completely ambulatory  Vitals:   10/01/19 1232  BP: (!) 117/91  Pulse: 72  Resp: 18  Temp: 98.5 F (36.9 C)  SpO2: 96%   Filed Weights  10/01/19 1232  Weight: 241 lb 12.8 oz (109.7 kg)    GENERAL:alert, no distress and comfortable Musculoskeletal:no cyanosis of digits and no clubbing  NEURO: alert & oriented x 3 with fluent speech, no focal motor/sensory deficits  LABORATORY DATA:  I have reviewed the data as listed    Component Value Date/Time   NA 142 10/01/2019 1221   K 3.3 (L) 10/01/2019 1221   CL 108 10/01/2019 1221   CO2 25 10/01/2019 1221   GLUCOSE 139 (H) 10/01/2019 1221   BUN 7 10/01/2019 1221   CREATININE 0.83 10/01/2019 1221   CALCIUM 8.5 (L) 10/01/2019 1221   PROT 7.6 09/20/2018 1326   ALBUMIN 3.5 09/20/2018 1326   AST 19 09/20/2018 1326   ALT 12 09/20/2018 1326   ALKPHOS 66 09/20/2018 1326   BILITOT 0.4 09/20/2018 1326   GFRNONAA >60 10/01/2019 1221   GFRAA >60 10/01/2019 1221     No results found for: SPEP, UPEP  Lab Results  Component Value Date   WBC 3.4 (L) 10/01/2019   NEUTROABS 2.0 10/01/2019   HGB 10.0 (L) 10/01/2019   HCT 29.8 (L) 10/01/2019   MCV 88.2 10/01/2019   PLT 226 10/01/2019      Chemistry      Component Value Date/Time   NA 142 10/01/2019 1221   K 3.3 (L) 10/01/2019 1221   CL 108 10/01/2019 1221   CO2 25 10/01/2019 1221   BUN 7 10/01/2019 1221   CREATININE 0.83 10/01/2019 1221      Component Value Date/Time   CALCIUM 8.5 (L) 10/01/2019 1221   ALKPHOS 66 09/20/2018 1326   AST 19 09/20/2018 1326   ALT 12 09/20/2018 1326   BILITOT 0.4 09/20/2018 1326       RADIOGRAPHIC STUDIES: I have personally reviewed the radiological images as listed and agreed with the findings in the report. US PELVIS LIMITED (TRANSABDOMINAL ONLY)  Result Date: 09/17/2019 CLINICAL DATA:  Ultrasound was provided for use by the ordering physician, and a technical charge was applied by the performing facility.  No radiologist interpretation/professional services rendered.   US Guided Needle Placement  Result Date: 09/17/2019 CLINICAL DATA:  Ultrasound was provided for use by the ordering physician, and a technical charge was applied by the performing facility.  No radiologist interpretation/professional services rendered.   [place] Intraoperative  Result Date: 09/11/2019 CLINICAL DATA:  Ultrasound was provided for use by the ordering physician, and a technical charge was applied by the performing facility.  No radiologist interpretation/professional services rendered.   [place] Intraoperative  Result Date: 09/06/2019 CLINICAL DATA:  Ultrasound was provided for use by the ordering physician, and a technical charge was applied by the performing facility.  No radiologist interpretation/professional services rendered.    All questions were answered. The patient knows to call the clinic with any problems, questions or concerns. No barriers to learning was  detected.  I spent 15 minutes counseling the patient face to face. The total time spent in the appointment was 20 minutes and more than 50% was on counseling and review of test results  Heath Lark, MD 10/01/2019 2:31 PM

## 2019-10-01 NOTE — Assessment & Plan Note (Signed)
She has mild neuropathy from recent treatment It is not causing pain I recommend close observation only for now

## 2019-10-01 NOTE — Assessment & Plan Note (Signed)
Her diarrhea is improving and likely the cause of mild hypokalemia She will continue to take Imodium as needed and potassium rich diet

## 2019-10-01 NOTE — Telephone Encounter (Signed)
-----   Message from Heath Lark, MD sent at 10/01/2019  1:53 PM EST ----- Regarding: pls call: ok to stop magnesium. advise on potassium rich diet

## 2019-10-02 ENCOUNTER — Telehealth: Payer: Self-pay | Admitting: Hematology and Oncology

## 2019-10-02 NOTE — Telephone Encounter (Signed)
Scheduled appt per 12/28 sch message - pt aware of appt date and time

## 2019-10-15 ENCOUNTER — Encounter: Payer: Self-pay | Admitting: Radiation Oncology

## 2019-10-15 ENCOUNTER — Ambulatory Visit
Admission: RE | Admit: 2019-10-15 | Discharge: 2019-10-15 | Disposition: A | Discharge status: Home / Self Care | Payer: BLUE CROSS/BLUE SHIELD | Source: Ambulatory Visit | Attending: Radiation Oncology | Admitting: Radiation Oncology

## 2019-10-15 ENCOUNTER — Other Ambulatory Visit: Payer: Self-pay

## 2019-10-15 VITALS — BP 121/80 | HR 70 | Temp 98.7°F | Resp 18 | Ht 66.0 in | Wt 238.5 lb

## 2019-10-15 DIAGNOSIS — G629 Polyneuropathy, unspecified: Secondary | ICD-10-CM | POA: Diagnosis not present

## 2019-10-15 DIAGNOSIS — Z79899 Other long term (current) drug therapy: Secondary | ICD-10-CM | POA: Diagnosis not present

## 2019-10-15 DIAGNOSIS — C538 Malignant neoplasm of overlapping sites of cervix uteri: Secondary | ICD-10-CM | POA: Diagnosis not present

## 2019-10-15 DIAGNOSIS — Z7982 Long term (current) use of aspirin: Secondary | ICD-10-CM | POA: Insufficient documentation

## 2019-10-15 DIAGNOSIS — Z7901 Long term (current) use of anticoagulants: Secondary | ICD-10-CM | POA: Insufficient documentation

## 2019-10-15 DIAGNOSIS — Z923 Personal history of irradiation: Secondary | ICD-10-CM | POA: Diagnosis not present

## 2019-10-15 NOTE — Progress Notes (Incomplete)
  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 She was also undergoing radiosensitizing chemotherapy.  During treatment, patient did report some vaginal bleeding, nausea associated with headaches, diarrhea, mild fatigue, leg cramps, and rectal irritation. She was advised to use Imodium and Tucks medicated pads. Nausea medication given by medical oncology. Patient denied rectal bleeding and dysuria.  Plan: The patient will undergo CT simulation and begin HDR brachytherapy treatment on 08/14/2019.   __________________________________________   Blair Promise, PhD, MD  This document serves as a record of services personally performed by Gery Pray, MD. It was created on his behalf by Clerance Lav, a trained medical scribe. The creation of this record is based on the scribe's personal observations and the provider's statements to them. This document has been checked and approved by the attending provider.

## 2019-10-15 NOTE — Progress Notes (Signed)
Pt presents today for f/u with Dr. Sondra Come. Pt reports that fatigue is lingering. Pt denies c/o pain. Pt denies dysuria/hematuria. Pt denies vaginal bleeding. Pt reports clear vaginal discharge. Pt denies rectal bleeding. Pt reports diarrhea, Immodium with good relief. Pt denies abdominal bloating, N/V.  BP 121/80 (BP Location: Left Arm, Patient Position: Sitting)   Pulse 70   Temp 98.7 F (37.1 C) (Temporal)   Resp 18   Ht 5\' 6"  (1.676 m)   Wt 238 lb 8 oz (108.2 kg)   SpO2 100%   BMI 38.49 kg/m   Wt Readings from Last 3 Encounters:  10/15/19 238 lb 8 oz (108.2 kg)  10/01/19 241 lb 12.8 oz (109.7 kg)  09/11/19 234 lb 8 oz (106.4 kg)   Loma Sousa, RN BSN     Home Care Instructions for the Insertion and Care of Your Vaginal Dilator  Why Do I Need a Vaginal Dilator?  Internal radiation therapy may cause scar tissue to form at the top of your vagina (vaginal cuff).  This may make vaginal examinations difficult in the future. You can prevent scar tissue from forming by using a vaginal dilator (a smooth plastic rod), and/or by having regular sexual intercourse.  If not using the dilator you should be having intercourse two or three times a week.  If you are unable to have intercourse, you should use your vaginal dilator.  You may have some spotting or bleeding from your dilator or intercourse the first few times. You may also have some discomfort. If discomfort occurs with intercourse, you and your partner may need to stop for a while and try again later.  How to Use Your Vaginal Dilator  - Wash the dilator with soap and water before and after each use. - Check the dilator to be sure it is smooth. Do not use the dilator if you find any roughspots. - Coat the dilator with K-Y Jelly, Astroglide, or Replens. Do not use Vaseline, baby oil, or other oil based lubricants. They are not water-soluble and can be irritating to the tissues in the vagina. - Lie on your back with your  knees bent and legs apart. - Insert the rounded end of the dilator into your vagina as far as it will go without causing pain or discomfort. - Close your knees and slowly straighten your legs. - Keep the dilator in your vagina for about 10 to 15 minutes.  Please use 3 times a week, for example: Monday, Wednesday and Friday evenings. [facility] your knees, open your legs, and gently remove the dilator. - Gently cleanse the skin around the vaginal opening. - Wash the dilator after each use. -  It is important that you use the dilator routinely until instructed otherwise by your doctor.

## 2019-10-15 NOTE — Progress Notes (Incomplete)
  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 Patient received external beam radiation directed at the pelvis region along with radiosensitizing chemotherapy from 07/09/2019 - 08/10/2019. She tolerated that relatively well but did complain of some vaginal bleeding, nausea associated with headaches, diarrhea, mild fatigue, leg cramps, and rectal irritation.  She began HDR brachytherapy treatments with tandem ring on 08/14/2019. She tolerated treatment relatively well but did complain of some mild cramping.  Plan: The patient will follow-up with radiation oncology in one month. ________________________________________________   Blair Promise, PhD, MD  This document serves as a record of services personally performed by Gery Pray, MD. It was created on his behalf by Clerance Lav, a trained medical scribe. The creation of this record is based on the scribe's personal observations and the provider's statements to them. This document has been checked and approved by the attending provider.

## 2019-10-15 NOTE — Progress Notes (Signed)
Radiation Oncology         [phone] ________________________________  Name: Emma Medina MRN: [id number]  Date: 10/15/2019  DOB: July 18, 1971  Follow-Up Visit Note  CC: Placey, Audrea Muscat, NP  Placey, Audrea Muscat, NP    ICD-10-CM   1. 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  Radiation Treatment Dates: 07/09/2019 through 09/11/2019 Site Technique Total Dose (Gy) Dose per Fx (Gy) Completed Fx Beam Energies  Cervix: Cervix_Bst HDR-brachy 5.5/5.5 5.5 1/1 Ir-192  Cervix: Cervix 3D 45/45 1.8 25/25 15X    Narrative:  The patient returns today for routine follow-up. She is doing well overall.  Since end of treatment, patient followed up with Dr. Alvy Bimler on 10/01/2019, during which time they discussed close observation of mild residual neuropathy. Port flushes and PET-CT scan planned for this year.  On review of systems, patient reports no pelvic pain or abdominal bloating. Patient denies nausea or vaginal bleeding.  She has resumed intercourse and has no postcoital bleeding.  ALLERGIES:  has No Known Allergies.  Meds: Current Outpatient Medications  Medication Sig Dispense Refill  . aspirin EC 81 MG EC tablet Take 1 tablet (81 mg total) by mouth daily.    Alveda Reasons 20 MG TABS tablet Take 1 tablet by mouth once daily 30 tablet 6  . cyclobenzaprine (FLEXERIL) 10 MG tablet Take 10 mg by mouth daily.     Marland Kitchen lidocaine-prilocaine (EMLA) cream Apply to affected area once (Patient not taking: Reported on 10/15/2019) 30 g 3  . magnesium oxide (MAG-OX) 400 (241.3 Mg) MG tablet Take 1 tablet (400 mg total) by mouth 2 (two) times daily. (Patient not taking: Reported on 10/15/2019) 60 tablet 11  . ondansetron (ZOFRAN) 8 MG tablet Take 1 tablet (8 mg total) by mouth every 8 (eight) hours as needed. Start on the third day after chemotherapy. (Patient not  taking: Reported on 10/15/2019) 30 tablet 1  . phenazopyridine (PYRIDIUM) 200 MG tablet Take 1 tablet (200 mg total) by mouth 3 (three) times daily as needed for pain. (Patient not taking: Reported on 10/15/2019) 25 tablet 1  . prochlorperazine (COMPAZINE) 10 MG tablet Take 1 tablet (10 mg total) by mouth every 6 (six) hours as needed (Nausea or vomiting). (Patient not taking: Reported on 10/15/2019) 30 tablet 1  . traMADol (ULTRAM) 50 MG tablet Take 2 tablets (100 mg total) by mouth every 6 (six) hours as needed. (Patient not taking: Reported on 10/15/2019) 60 tablet 0   No current facility-administered medications for this encounter.    Physical Findings: The patient is in no acute distress. Patient is alert and oriented.  height is 5\' 6"  (1.676 m) and weight is 238 lb 8 oz (108.2 kg). Her temporal temperature is 98.7 F (37.1 C). Her blood pressure is 121/80 and her pulse is 70. Her respiration is 18 and oxygen saturation is 100%. .  Lungs are clear to auscultation bilaterally. Heart has regular rate and rhythm. No palpable cervical, supraclavicular, or axillary adenopathy. Abdomen soft, non-tender, normal bowel sounds. Pelvic exam deferred in light of recent completion of treatment.   Lab Findings: Lab Results  Component Value Date   WBC 3.4 (L) 10/01/2019   HGB 10.0 (L) 10/01/2019   HCT 29.8 (L) 10/01/2019   MCV 88.2 10/01/2019   PLT 226 10/01/2019    Radiographic Findings: No results  found.  Impression: The patient is recovering from the effects of radiation.  She completed a definitive course of radiation therapy including external beam radiosensitizing chemotherapy and 5 brachytherapy treatments.  Patient was given a vaginal dilator and instructions on its use.  Plan: Patient will follow up with Dr. Alvy Bimler on 12/11/2019 and with radiation oncology in 5 months. She has a routine screening mammogram scheduled for 10/18/2019.  She will proceed with a PET scan 3 months out from  completion of her treatment.  At that time the patient will see Dr. Denman George for examination.  ____________________________________   Blair Promise, PhD, MD  This document serves as a record of services personally performed by Gery Pray, MD. It was created on his behalf by Clerance Lav, a trained medical scribe. The creation of this record is based on the scribe's personal observations and the provider's statements to them. This document has been checked and approved by the attending provider.

## 2019-10-15 NOTE — Patient Instructions (Signed)
Home Care Instructions for the Insertion and Care of Your Vaginal Dilator  Why Do I Need a Vaginal Dilator?  Internal radiation therapy may cause scar tissue to form at the top of your vagina (vaginal cuff).  This may make vaginal examinations difficult in the future. You can prevent scar tissue from forming by using a vaginal dilator (a smooth plastic rod), and/or by having regular sexual intercourse.  If not using the dilator you should be having intercourse two or three times a week.  If you are unable to have intercourse, you should use your vaginal dilator.  You may have some spotting or bleeding from your dilator or intercourse the first few times. You may also have some discomfort. If discomfort occurs with intercourse, you and your partner may need to stop for a while and try again later.  How to Use Your Vaginal Dilator  - Wash the dilator with soap and water before and after each use. - Check the dilator to be sure it is smooth. Do not use the dilator if you find any roughspots. - Coat the dilator with K-Y Jelly, Astroglide, or Replens. Do not use Vaseline, baby oil, or other oil based lubricants. They are not water-soluble and can be irritating to the tissues in the vagina. - Lie on your back with your knees bent and legs apart. - Insert the rounded end of the dilator into your vagina as far as it will go without causing pain or discomfort. - Close your knees and slowly straighten your legs. - Keep the dilator in your vagina for about 10 to 15 minutes.  Please use 3 times a week, for example: Monday, Wednesday and Friday evenings. - Bend your knees, open your legs, and gently remove the dilator. - Gently cleanse the skin around the vaginal opening. - Wash the dilator after each use. -  It is important that you use the dilator routinely until instructed otherwise by your doctor.   Coronavirus (COVID-19) Are you at risk?  Are you at risk for the Coronavirus  (COVID-19)?  To be considered HIGH RISK for Coronavirus (COVID-19), you have to meet the following criteria:  . Traveled to [place], [place], [place], [place] or [place]; or in the [place] to [place], [place], [place], or [place]; and have fever, cough, and shortness of breath within the last 2 weeks of travel OR . Been in close contact with a person diagnosed with COVID-19 within the last 2 weeks and have fever, cough, and shortness of breath . IF YOU DO NOT MEET THESE CRITERIA, YOU ARE CONSIDERED LOW RISK FOR COVID-19.  What to do if you are HIGH RISK for COVID-19?  . If you are having a medical emergency, call 911. . Seek medical care right away. Before you go to a doctor's office, urgent care or emergency department, call ahead and tell them about your recent travel, contact with someone diagnosed with COVID-19, and your symptoms. You should receive instructions from your physician's office regarding next steps of care.  . When you arrive at healthcare provider, tell the healthcare staff immediately you have returned from visiting [place], [place], [place], [place] or [place]; or traveled in the [place] to [place], [place], [place], or [place]; in the last two weeks or you have been in close contact with a person diagnosed with COVID-19 in the last 2 weeks.   . Tell the health care staff about your symptoms: fever, cough and shortness of breath. .   After you have been seen by a medical provider, you will be either: o Tested for (COVID-19) and discharged home on quarantine except to seek medical care if symptoms worsen, and asked to  - Stay home and avoid contact with others until you get your results (4-5 days)  - Avoid travel on public transportation if possible (such as bus, train, or airplane) or o Sent to the Emergency Department by EMS for evaluation, COVID-19 testing, and possible admission depending on your condition and test results.  What to do if you are LOW  RISK for COVID-19?  Reduce your risk of any infection by using the same precautions used for avoiding the common cold or flu:  . Wash your hands often with soap and warm water for at least 20 seconds.  If soap and water are not readily available, use an alcohol-based hand sanitizer with at least 60% alcohol.  . If coughing or sneezing, cover your mouth and nose by coughing or sneezing into the elbow areas of your shirt or coat, into a tissue or into your sleeve (not your hands). . Avoid shaking hands with others and consider head nods or verbal greetings only. . Avoid touching your eyes, nose, or mouth with unwashed hands.  . Avoid close contact with people who are sick. . Avoid places or events with large numbers of people in one location, like concerts or sporting events. . Carefully consider travel plans you have or are making. . If you are planning any travel outside or inside the [place], visit the CDC's Travelers' Health webpage for the latest health notices. . If you have some symptoms but not all symptoms, continue to monitor at home and seek medical attention if your symptoms worsen. . If you are having a medical emergency, call 911.   ADDITIONAL HEALTHCARE OPTIONS FOR PATIENTS  [facility] / e-Visit: [email or web address]         [facility]: [phone]  [facility]: [phone]                   [facility]: [phone]   

## 2019-10-16 ENCOUNTER — Other Ambulatory Visit: Payer: Self-pay

## 2019-10-16 ENCOUNTER — Encounter (HOSPITAL_COMMUNITY): Payer: Self-pay

## 2019-10-16 ENCOUNTER — Ambulatory Visit (HOSPITAL_COMMUNITY)
Admission: RE | Admit: 2019-10-16 | Discharge: 2019-10-16 | Disposition: A | Discharge status: Home / Self Care | Payer: BLUE CROSS/BLUE SHIELD | Source: Ambulatory Visit | Attending: Obstetrics and Gynecology | Admitting: Obstetrics and Gynecology

## 2019-10-16 DIAGNOSIS — Z1239 Encounter for other screening for malignant neoplasm of breast: Secondary | ICD-10-CM

## 2019-10-16 NOTE — Progress Notes (Signed)
No complaints today.   Pap Smear: Pap smear not completed today. Last Pap smear was 06/01/2019 at the [facility] and squamous cell carcinoma and positive HPV. Patient has a history of cervical cancer and is followed by the [facility]. Last Pap smear result is in Epic.  Physical exam: Breasts Breasts symmetrical. No skin abnormalities bilateral breasts. No nipple retraction bilateral breasts. No nipple discharge bilateral breasts. No lymphadenopathy. No lumps palpated bilateral breasts. Complaintso mild right outer breast tenderness on exam. Referred patient to the [facility] for a screening mammogram. Appointment scheduled for Thursday, October 18, 2019 at 0900.    Pelvic/Bimanual No Pap smear completed today since last Pap smear and HPV typing was 12/01/2018. Pap smear not indicated per BCCCP guidelines.   Smoking History: Patient has never smoked.  Patient Navigation: Patient education provided. Access to services provided for patient through [facility] program.    Colorectal Cancer Screening: Per patient has never had a colonoscopy completed. No complaints today.   Breast and Cervical Cancer Risk Assessment: Patient has no family history of breast cancer, known genetic mutations, or radiation treatment to the chest before age 23. Patient has a history of cervical cancer. Patient has no history of being immunocompromised or DES exposure in-utero.  Risk Assessment    Risk Scores      10/16/2019   Last edited by: Loletta Parish, RN   5-year risk: 1.1 %   Lifetime risk: 8.6 %

## 2019-10-16 NOTE — Patient Instructions (Signed)
Explained breast self awareness with Emma Medina. Patient did not need a Pap smear today due to last Pap smear was 06/01/2019. Patient is being followed by the [facility] for her history of cervical cancer. Referred patient to the [facility] for a screening mammogram. Appointment scheduled for Thursday, October 18, 2019 at 0900. Patient aware of appointment and will be there. Let patient know the [facility] will follow up with her within the next couple weeks with results her mammogram by letter or phone. Emma Medina verbalized understanding.  Emma Medina, Arvil Chaco, RN 1:18 PM

## 2019-10-18 ENCOUNTER — Other Ambulatory Visit: Payer: Self-pay

## 2019-10-18 ENCOUNTER — Ambulatory Visit
Admission: RE | Admit: 2019-10-18 | Discharge: 2019-10-18 | Disposition: A | Discharge status: Home / Self Care | Payer: BLUE CROSS/BLUE SHIELD | Source: Ambulatory Visit | Attending: Obstetrics and Gynecology | Admitting: Obstetrics and Gynecology

## 2019-10-18 ENCOUNTER — Other Ambulatory Visit (HOSPITAL_COMMUNITY): Payer: Self-pay | Admitting: Obstetrics and Gynecology

## 2019-10-18 DIAGNOSIS — Z1231 Encounter for screening mammogram for malignant neoplasm of breast: Secondary | ICD-10-CM

## 2019-10-23 ENCOUNTER — Ambulatory Visit: Payer: BLUE CROSS/BLUE SHIELD | Admitting: Family Medicine

## 2019-11-13 ENCOUNTER — Other Ambulatory Visit: Payer: Self-pay

## 2019-11-13 ENCOUNTER — Encounter: Payer: Self-pay | Admitting: Family Medicine

## 2019-11-13 ENCOUNTER — Ambulatory Visit: Payer: BLUE CROSS/BLUE SHIELD | Attending: Family Medicine | Admitting: Family Medicine

## 2019-11-13 VITALS — BP 130/84 | HR 81 | Ht 66.0 in | Wt 244.0 lb

## 2019-11-13 DIAGNOSIS — G620 Drug-induced polyneuropathy: Secondary | ICD-10-CM

## 2019-11-13 DIAGNOSIS — R600 Localized edema: Secondary | ICD-10-CM

## 2019-11-13 DIAGNOSIS — R42 Dizziness and giddiness: Secondary | ICD-10-CM

## 2019-11-13 DIAGNOSIS — R202 Paresthesia of skin: Secondary | ICD-10-CM

## 2019-11-13 DIAGNOSIS — C538 Malignant neoplasm of overlapping sites of cervix uteri: Secondary | ICD-10-CM

## 2019-11-13 DIAGNOSIS — T451X5A Adverse effect of antineoplastic and immunosuppressive drugs, initial encounter: Secondary | ICD-10-CM

## 2019-11-13 MED ORDER — MECLIZINE HCL 25 MG PO TABS: 25.0000 mg | ORAL_TABLET | Freq: Three times a day (TID) | ORAL | 1 refills | Status: DC | PRN

## 2019-11-13 MED ORDER — GABAPENTIN 300 MG PO CAPS: 300.0000 mg | ORAL_CAPSULE | Freq: Every day | ORAL | 3 refills | Status: DC

## 2019-11-13 NOTE — Progress Notes (Signed)
Swelling around eyes Fingertips are numb Having dizzy spells

## 2019-11-13 NOTE — Progress Notes (Signed)
Subjective:  Patient ID: Emma Medina, female    DOB: 1970-11-07  Age: 49 y.o. MRN: [id number]  CC: New Patient (Initial Visit)   HPI Emma Medina is a 49 year old female with a history of previous left lower extremity DVT, cervical cancer diagnosed in 05/2019 (cT1b1, cN0, cM0)  status post tandem ring insertion and radiation chemotherapy) who presents today to establish care. Last visit with her oncologist Dr. Alvy Bimler which was on 09/2019 where she has been scheduled for a follow-up PET scan. She has had infra orbital swelling for the last of months but no pedal edema or weight gain. Has numbness in her fingertips which resole on elevation of arms for the last couple of weeeks L medial 3 toes are also numb. Can't hold things when numbness is present, occurs mainly in the mornings and it sometimes wakes her up from sleep. Has also been dizzy when she gets up  And this is associated with nausea. It happens with turning of her head or change of position. The room does not spin but she endorses having some tinnitus. Denies presence of sinus symptoms.  Past Medical History:  Diagnosis Date  . Cancer (HCC)    cervical  . Hypertension   . Obstructive thrombus 2019   left leg  . Sciatica    back    Past Surgical History:  Procedure Laterality Date  . ABDOMINAL AORTOGRAM N/A 05/18/2018   Procedure: ABDOMINAL AORTOGRAM;  Surgeon: Waynetta Sandy, MD;  Location: [facility];  Service: Cardiovascular;  Laterality: N/A;  . ABDOMINAL AORTOGRAM W/LOWER EXTREMITY Left 10/26/2018   Procedure: ABDOMINAL AORTOGRAM W/LOWER EXTREMITY;  Surgeon: Waynetta Sandy, MD;  Location: [facility];  Service: Cardiovascular;  Laterality: Left;  . IR IMAGING GUIDED PORT INSERTION  07/05/2019  . LOWER EXTREMITY ANGIOGRAPHY Bilateral 05/18/2018   Procedure: LOWER EXTREMITY ANGIOGRAPHY;  Surgeon: Waynetta Sandy, MD;  Location: [facility];  Service:  Cardiovascular;  Laterality: Bilateral;  . OPERATIVE ULTRASOUND N/A 08/14/2019   Procedure: OPERATIVE ULTRASOUND;  Surgeon: Gery Pray, MD;  Location: [facility];  Service: Urology;  Laterality: N/A;  . OPERATIVE ULTRASOUND N/A 08/20/2019   Procedure: OPERATIVE ULTRASOUND;  Surgeon: Gery Pray, MD;  Location: [facility];  Service: Urology;  Laterality: N/A;  . OPERATIVE ULTRASOUND N/A 08/29/2019   Procedure: OPERATIVE ULTRASOUND;  Surgeon: Gery Pray, MD;  Location: [facility];  Service: Urology;  Laterality: N/A;  . OPERATIVE ULTRASOUND N/A 09/06/2019   Procedure: OPERATIVE ULTRASOUND;  Surgeon: Gery Pray, MD;  Location: [facility];  Service: Urology;  Laterality: N/A;  . OPERATIVE ULTRASOUND N/A 09/11/2019   Procedure: OPERATIVE ULTRASOUND;  Surgeon: Gery Pray, MD;  Location: [facility];  Service: Urology;  Laterality: N/A;  . TANDEM RING INSERTION N/A 08/14/2019   Procedure: TANDEM RING INSERTION - FIRST;  Surgeon: Gery Pray, MD;  Location: [facility];  Service: Urology;  Laterality: N/A;  . TANDEM RING INSERTION N/A 08/20/2019   Procedure: TANDEM RING INSERTION;  Surgeon: Gery Pray, MD;  Location: [facility];  Service: Urology;  Laterality: N/A;  . TANDEM RING INSERTION N/A 08/29/2019   Procedure: TANDEM RING INSERTION;  Surgeon: Gery Pray, MD;  Location: [facility];  Service: Urology;  Laterality: N/A;  . TANDEM RING INSERTION N/A 09/06/2019   Procedure: TANDEM RING INSERTION;  Surgeon: Gery Pray, MD;  Location: [facility];  Service: Urology;  Laterality: N/A;  . TANDEM RING INSERTION N/A 09/11/2019   Procedure: TANDEM RING INSERTION;  Surgeon: Gery Pray, MD;  Location: [facility];  Service: Urology;  Laterality: N/A;  . THROMBECTOMY FEMORAL ARTERY Left 05/18/2018   Procedure: LEFT LEG THROMBECTOMY,  BALLOON ANGIOPLASTY LEFT POSTERIOR TIBIAL ARTERY;  Surgeon: Waynetta Sandy, MD;  Location: [facility];  Service: Vascular;  Laterality: Left;  . TUBAL LIGATION      Family History  Problem Relation Age of Onset  . Cancer Mother        brain and uterine  . Stroke Brother   . Diabetes Brother   . Clotting disorder Brother   . Breast cancer Neg Hx     No Known Allergies  Outpatient Medications Prior to Visit  Medication Sig Dispense Refill  . aspirin EC 81 MG EC tablet Take 1 tablet (81 mg total) by mouth daily.    Alveda Reasons 20 MG TABS tablet Take 1 tablet by mouth once daily 30 tablet 6  . cyclobenzaprine (FLEXERIL) 10 MG tablet Take 10 mg by mouth daily.     Marland Kitchen lidocaine-prilocaine (EMLA) cream Apply to affected area once (Patient not taking: Reported on 10/15/2019) 30 g 3  . magnesium oxide (MAG-OX) 400 (241.3 Mg) MG tablet Take 1 tablet (400 mg total) by mouth 2 (two) times daily. (Patient not taking: Reported on 10/15/2019) 60 tablet 11  . ondansetron (ZOFRAN) 8 MG tablet Take 1 tablet (8 mg total) by mouth every 8 (eight) hours as needed. Start on the third day after chemotherapy. (Patient not taking: Reported on 10/15/2019) 30 tablet 1  . phenazopyridine (PYRIDIUM) 200 MG tablet Take 1 tablet (200 mg total) by mouth 3 (three) times daily as needed for pain. (Patient not taking: Reported on 10/15/2019) 25 tablet 1  . prochlorperazine (COMPAZINE) 10 MG tablet Take 1 tablet (10 mg total) by mouth every 6 (six) hours as needed (Nausea or vomiting). (Patient not taking: Reported on 10/15/2019) 30 tablet 1  . traMADol (ULTRAM) 50 MG tablet Take 2 tablets (100 mg total) by mouth every 6 (six) hours as needed. (Patient not taking: Reported on 10/15/2019) 60 tablet 0   No facility-administered medications prior to visit.     ROS Review of Systems  Constitutional: Negative for activity change, appetite change and fatigue.  HENT: Negative for congestion, sinus pressure and sore throat.     Eyes: Negative for visual disturbance.  Respiratory: Negative for cough, chest tightness, shortness of breath and wheezing.   Cardiovascular: Negative for chest pain and palpitations.  Gastrointestinal: Negative for abdominal distention, abdominal pain and constipation.  Endocrine: Negative for polydipsia.  Genitourinary: Negative for dysuria and frequency.  Musculoskeletal: Negative for arthralgias and back pain.  Skin: Negative for rash.  Neurological: Positive for dizziness and numbness. Negative for tremors and light-headedness.  Hematological: Does not bruise/bleed easily.  Psychiatric/Behavioral: Negative for agitation and behavioral problems.    Objective:  BP 130/84   Pulse 81   Ht 5\' 6"  (1.676 m)   Wt 244 lb (110.7 kg)   SpO2 100%   BMI 39.38 kg/m   BP/Weight 11/13/2019 10/16/2019 [id number]  Systolic BP [id number]  Diastolic BP 84 97 80  Wt. (Lbs) 244 239 238.5  BMI 39.38 38.58 38.49      Physical Exam Constitutional:      Appearance: She is well-developed.  HENT:     Head:     Comments: Cerumen obscuring TM bilaterally No sinus tenderness. Normal  oropharynx Eyes:     Comments: Infra orbital edema b/l  Neck:     Vascular: No JVD.  Cardiovascular:     Rate and Rhythm: Normal rate.     Heart sounds: Normal heart sounds. No murmur.  Pulmonary:     Effort: Pulmonary effort is normal.     Breath sounds: Normal breath sounds. No wheezing or rales.  Chest:     Chest wall: No tenderness.  Abdominal:     General: Bowel sounds are normal. There is no distension.     Palpations: Abdomen is soft. There is no mass.     Tenderness: There is no abdominal tenderness.  Musculoskeletal:        General: Normal range of motion.     Right lower leg: No edema.     Left lower leg: No edema.  Neurological:     Mental Status: She is alert and oriented to person, place, and time.  Psychiatric:        Mood and Affect: Mood normal.     CMP Latest Ref Rng & Units  10/01/2019 08/17/2019 08/10/2019  Glucose 70 - 99 mg/dL 139(H) 118(H) 107(H)  BUN 6 - 20 mg/dL 7 12 16   Creatinine 0.44 - 1.00 mg/dL 0.83 0.77 0.84  Sodium 135 - 145 mmol/L 142 143 139  Potassium 3.5 - 5.1 mmol/L 3.3(L) 4.0 3.5  Chloride 98 - 111 mmol/L 108 106 102  CO2 22 - 32 mmol/L 25 27 24   Calcium 8.9 - 10.3 mg/dL 8.5(L) 8.4(L) 8.6(L)  Total Protein 6.5 - 8.1 g/dL - - -  Total Bilirubin 0.3 - 1.2 mg/dL - - -  Alkaline Phos 38 - 126 U/L - - -  AST 15 - 41 U/L - - -  ALT 0 - 44 U/L - - -    Lipid Panel  No results found for: CHOL, TRIG, HDL, CHOLHDL, VLDL, LDLCALC, LDLDIRECT  CBC    Component Value Date/Time   WBC 3.4 (L) 10/01/2019 1221   WBC 6.2 07/05/2019 1230   RBC 3.38 (L) 10/01/2019 1221   HGB 10.0 (L) 10/01/2019 1221   HGB 11.8 05/24/2019 1034   HCT 29.8 (L) 10/01/2019 1221   HCT 37.5 05/24/2019 1034   PLT 226 10/01/2019 1221   PLT 461 (H) 05/24/2019 1034   MCV 88.2 10/01/2019 1221   MCV 73 (L) 05/24/2019 1034   MCH 29.6 10/01/2019 1221   MCHC 33.6 10/01/2019 1221   RDW 16.5 (H) 10/01/2019 1221   RDW 28.0 (H) 05/24/2019 1034   LYMPHSABS 0.7 10/01/2019 1221   MONOABS 0.5 10/01/2019 1221   EOSABS 0.2 10/01/2019 1221   BASOSABS 0.0 10/01/2019 1221    Lab Results  Component Value Date   HGBA1C 6.0 (H) 05/18/2018    Assessment & Plan:   1. Vertigo Uncontrolled We will commence meclizine Encouraged to obtain OTC ceruminolytic's - meclizine (ANTIVERT) 25 MG tablet; Take 1 tablet (25 mg total) by mouth 3 (three) times daily as needed for dizziness.  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  Dispense: 30 capsule; Refill: 3 - Basic Metabolic  Panel  3. Periorbital edema of both eyes Suspicious for chronic allergies Advised to use OTC antihistamines  No evidence of renal disease but will check labs   4.  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  Charlott Rakes, MD, FAAFP. [facility] [place], [place]   11/13/2019, 11:09 AM

## 2019-11-14 LAB — BASIC METABOLIC PANEL
BUN/Creatinine Ratio: 10 (ref 9–23)
BUN: 8 mg/dL (ref 6–24)
CO2: 19 mmol/L — ABNORMAL LOW (ref 20–29)
Calcium: 9.7 mg/dL (ref 8.7–10.2)
Chloride: 108 mmol/L — ABNORMAL HIGH (ref 96–106)
Creatinine, Ser: 0.82 mg/dL (ref 0.57–1.00)
GFR calc Af Amer: 96 mL/min/{1.73_m2} (ref >59)
GFR calc non Af Amer: 84 mL/min/{1.73_m2} (ref >59)
Glucose: 87 mg/dL (ref 65–99)
Potassium: 4.4 mmol/L (ref 3.5–5.2)
Sodium: 145 mmol/L — ABNORMAL HIGH (ref 134–144)

## 2019-11-14 LAB — VITAMIN B12: Vitamin B-12: 322 pg/mL (ref 232–1245)

## 2019-11-14 LAB — HEMOGLOBIN A1C
Est. average glucose Bld gHb Est-mCnc: 114 mg/dL
Hgb A1c MFr Bld: 5.6 % (ref 4.8–5.6)

## 2019-11-15 ENCOUNTER — Telehealth: Payer: Self-pay

## 2019-11-15 NOTE — Telephone Encounter (Signed)
-----   Message from Charlott Rakes, MD sent at 11/14/2019  8:56 AM EST ----- Can you please ensure she has seen the MyChart message I sent her?  Thank you.

## 2019-11-15 NOTE — Telephone Encounter (Signed)
Patient name and DOB has been verified Patient was informed of lab results. Patient had no questions.  

## 2019-11-27 ENCOUNTER — Other Ambulatory Visit: Payer: Self-pay

## 2019-11-27 ENCOUNTER — Inpatient Hospital Stay: Payer: BLUE CROSS/BLUE SHIELD | Attending: Gynecologic Oncology

## 2019-11-27 DIAGNOSIS — Z452 Encounter for adjustment and management of vascular access device: Secondary | ICD-10-CM | POA: Insufficient documentation

## 2019-11-27 DIAGNOSIS — C538 Malignant neoplasm of overlapping sites of cervix uteri: Secondary | ICD-10-CM | POA: Insufficient documentation

## 2019-11-27 MED ORDER — HEPARIN SOD (PORK) LOCK FLUSH 100 UNIT/ML IV SOLN
500.0000 [IU] | Freq: Once | INTRAVENOUS | Status: AC
  Administered 2019-11-27: 12:00:00 500 [IU]
  Filled 2019-11-27: qty 5

## 2019-11-27 MED ORDER — SODIUM CHLORIDE 0.9% FLUSH
10.0000 mL | Freq: Once | INTRAVENOUS | Status: AC
  Administered 2019-11-27: 10 mL
  Filled 2019-11-27: qty 10

## 2019-11-30 ENCOUNTER — Encounter (HOSPITAL_COMMUNITY): Payer: Self-pay

## 2019-11-30 ENCOUNTER — Ambulatory Visit (INDEPENDENT_AMBULATORY_CARE_PROVIDER_SITE_OTHER): Payer: BLUE CROSS/BLUE SHIELD

## 2019-11-30 ENCOUNTER — Other Ambulatory Visit: Payer: Self-pay

## 2019-11-30 ENCOUNTER — Ambulatory Visit (HOSPITAL_COMMUNITY)
Admission: EM | Admit: 2019-11-30 | Discharge: 2019-11-30 | Disposition: A | Discharge status: Home / Self Care | Payer: BLUE CROSS/BLUE SHIELD | Attending: Family Medicine | Admitting: Family Medicine

## 2019-11-30 DIAGNOSIS — M79602 Pain in left arm: Secondary | ICD-10-CM

## 2019-11-30 DIAGNOSIS — M25522 Pain in left elbow: Secondary | ICD-10-CM

## 2019-11-30 HISTORY — DX: Deep phlebothrombosis in pregnancy, unspecified trimester: O22.30

## 2019-11-30 MED ORDER — CYCLOBENZAPRINE HCL 10 MG PO TABS: 10.0000 mg | ORAL_TABLET | Freq: Two times a day (BID) | ORAL | 0 refills | Status: DC | PRN
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
## 2019-11-30 NOTE — Discharge Instructions (Addendum)
Rest and elevate your elbow.  Apply ice packs 2-3 times a day for up to 20 minutes each.  Wear an Ace wrap as needed for comfort.    Follow up with your primary care provider or an orthopedist if you symptoms continue or worsen;  Or if you develop new symptoms, such as numbness, tingling, or weakness.
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
## 2019-12-07 ENCOUNTER — Emergency Department (HOSPITAL_COMMUNITY): Payer: BLUE CROSS/BLUE SHIELD

## 2019-12-07 ENCOUNTER — Other Ambulatory Visit: Payer: Self-pay

## 2019-12-07 ENCOUNTER — Emergency Department (HOSPITAL_COMMUNITY)
Admission: EM | Admit: 2019-12-07 | Discharge: 2019-12-07 | Disposition: A | Discharge status: Home / Self Care | Payer: BLUE CROSS/BLUE SHIELD | Source: Home / Self Care | Attending: Emergency Medicine | Admitting: Emergency Medicine

## 2019-12-07 ENCOUNTER — Encounter (HOSPITAL_COMMUNITY): Payer: Self-pay | Admitting: Emergency Medicine

## 2019-12-07 ENCOUNTER — Encounter (HOSPITAL_COMMUNITY): Payer: Self-pay | Admitting: *Deleted

## 2019-12-07 ENCOUNTER — Emergency Department (HOSPITAL_COMMUNITY)
Admission: EM | Admit: 2019-12-07 | Discharge: 2019-12-07 | Disposition: A | Discharge status: Home / Self Care | Payer: BLUE CROSS/BLUE SHIELD | Attending: Emergency Medicine | Admitting: Emergency Medicine

## 2019-12-07 DIAGNOSIS — R0789 Other chest pain: Secondary | ICD-10-CM | POA: Insufficient documentation

## 2019-12-07 DIAGNOSIS — Z7982 Long term (current) use of aspirin: Secondary | ICD-10-CM | POA: Insufficient documentation

## 2019-12-07 DIAGNOSIS — I10 Essential (primary) hypertension: Secondary | ICD-10-CM | POA: Insufficient documentation

## 2019-12-07 DIAGNOSIS — R0609 Other forms of dyspnea: Secondary | ICD-10-CM | POA: Insufficient documentation

## 2019-12-07 DIAGNOSIS — Z7901 Long term (current) use of anticoagulants: Secondary | ICD-10-CM | POA: Insufficient documentation

## 2019-12-07 DIAGNOSIS — Z79899 Other long term (current) drug therapy: Secondary | ICD-10-CM | POA: Insufficient documentation

## 2019-12-07 DIAGNOSIS — Z87891 Personal history of nicotine dependence: Secondary | ICD-10-CM | POA: Insufficient documentation

## 2019-12-07 DIAGNOSIS — Z8541 Personal history of malignant neoplasm of cervix uteri: Secondary | ICD-10-CM | POA: Insufficient documentation

## 2019-12-07 LAB — TROPONIN I (HIGH SENSITIVITY)
Troponin I (High Sensitivity): 3 ng/L (ref <18)
Troponin I (High Sensitivity): 4 ng/L (ref <18)
Troponin I (High Sensitivity): 2 ng/L (ref <18)

## 2019-12-07 LAB — CBC WITH DIFFERENTIAL/PLATELET
Abs Immature Granulocytes: 0.03 10*3/uL (ref 0.00–0.07)
Basophils Absolute: 0 10*3/uL (ref 0.0–0.1)
Basophils Relative: 0 %
Eosinophils Absolute: 0.3 10*3/uL (ref 0.0–0.5)
Eosinophils Relative: 3 %
HCT: 39.7 % (ref 36.0–46.0)
Hemoglobin: 12.7 g/dL (ref 12.0–15.0)
Immature Granulocytes: 0 %
Lymphocytes Relative: 14 %
Lymphs Abs: 1.1 10*3/uL (ref 0.7–4.0)
MCH: 28.7 pg (ref 26.0–34.0)
MCHC: 32 g/dL (ref 30.0–36.0)
MCV: 89.8 fL (ref 80.0–100.0)
Monocytes Absolute: 0.8 10*3/uL (ref 0.1–1.0)
Monocytes Relative: 10 %
Neutro Abs: 5.6 10*3/uL (ref 1.7–7.7)
Neutrophils Relative %: 73 %
Platelets: 296 10*3/uL (ref 150–400)
RBC: 4.42 MIL/uL (ref 3.87–5.11)
RDW: 12.7 % (ref 11.5–15.5)
WBC: 7.8 10*3/uL (ref 4.0–10.5)
nRBC: 0 % (ref 0.0–0.2)

## 2019-12-07 LAB — COMPREHENSIVE METABOLIC PANEL
ALT: 13 U/L (ref 0–44)
AST: 18 U/L (ref 15–41)
Albumin: 3.9 g/dL (ref 3.5–5.0)
Alkaline Phosphatase: 74 U/L (ref 38–126)
Anion gap: 13 (ref 5–15)
BUN: 7 mg/dL (ref 6–20)
CO2: 23 mmol/L (ref 22–32)
Calcium: 9.5 mg/dL (ref 8.9–10.3)
Chloride: 104 mmol/L (ref 98–111)
Creatinine, Ser: 0.87 mg/dL (ref 0.44–1.00)
GFR calc Af Amer: >60 mL/min (ref >60)
GFR calc non Af Amer: >60 mL/min (ref >60)
Glucose, Bld: 99 mg/dL (ref 70–99)
Potassium: 4 mmol/L (ref 3.5–5.1)
Sodium: 140 mmol/L (ref 135–145)
Total Bilirubin: 0.6 mg/dL (ref 0.3–1.2)
Total Protein: 7.4 g/dL (ref 6.5–8.1)

## 2019-12-07 LAB — PROTIME-INR
INR: 1.4 — ABNORMAL HIGH (ref 0.8–1.2)
Prothrombin Time: 17 seconds — ABNORMAL HIGH (ref 11.4–15.2)

## 2019-12-07 LAB — CBG MONITORING, ED: Glucose-Capillary: 90 mg/dL (ref 70–99)

## 2019-12-07 LAB — BASIC METABOLIC PANEL
Anion gap: 14 (ref 5–15)
BUN: 8 mg/dL (ref 6–20)
CO2: 21 mmol/L — ABNORMAL LOW (ref 22–32)
Calcium: 9.5 mg/dL (ref 8.9–10.3)
Chloride: 106 mmol/L (ref 98–111)
Creatinine, Ser: 0.88 mg/dL (ref 0.44–1.00)
GFR calc Af Amer: >60 mL/min (ref >60)
GFR calc non Af Amer: >60 mL/min (ref >60)
Glucose, Bld: 106 mg/dL — ABNORMAL HIGH (ref 70–99)
Potassium: 3.5 mmol/L (ref 3.5–5.1)
Sodium: 141 mmol/L (ref 135–145)

## 2019-12-07 LAB — CBC
HCT: 40 % (ref 36.0–46.0)
Hemoglobin: 13.1 g/dL (ref 12.0–15.0)
MCH: 28.9 pg (ref 26.0–34.0)
MCHC: 32.8 g/dL (ref 30.0–36.0)
MCV: 88.3 fL (ref 80.0–100.0)
Platelets: 314 10*3/uL (ref 150–400)
RBC: 4.53 MIL/uL (ref 3.87–5.11)
RDW: 12.7 % (ref 11.5–15.5)
WBC: 7.2 10*3/uL (ref 4.0–10.5)
nRBC: 0 % (ref 0.0–0.2)

## 2019-12-07 LAB — I-STAT BETA HCG BLOOD, ED (MC, WL, AP ONLY)
I-stat hCG, quantitative: <5 m[IU]/mL (ref <5)
I-stat hCG, quantitative: <5 m[IU]/mL (ref <5)

## 2019-12-07 LAB — MAGNESIUM: Magnesium: 1.9 mg/dL (ref 1.7–2.4)

## 2019-12-07 LAB — D-DIMER, QUANTITATIVE: D-Dimer, Quant: 0.35 ug/mL-FEU (ref 0.00–0.50)

## 2019-12-07 LAB — BRAIN NATRIURETIC PEPTIDE: B Natriuretic Peptide: 60.3 pg/mL (ref 0.0–100.0)

## 2019-12-07 MED ORDER — SODIUM CHLORIDE 0.9% FLUSH: 3.0000 mL | Freq: Once | INTRAVENOUS | Status: DC

## 2019-12-07 MED ORDER — NAPROXEN 500 MG PO TABS: 500.0000 mg | ORAL_TABLET | Freq: Two times a day (BID) | ORAL | 0 refills | Status: DC

## 2019-12-07 MED ORDER — NAPROXEN 250 MG PO TABS
500.0000 mg | ORAL_TABLET | Freq: Once | ORAL | Status: AC
  Administered 2019-12-07: 500 mg via ORAL
  Filled 2019-12-07: qty 2

## 2019-12-07 MED ORDER — ONDANSETRON HCL 4 MG/2ML IJ SOLN
4.0000 mg | Freq: Once | INTRAMUSCULAR | Status: AC
  Administered 2019-12-07: 4 mg via INTRAVENOUS
  Filled 2019-12-07: qty 2

## 2019-12-07 MED ORDER — ASPIRIN 81 MG PO CHEW
324.0000 mg | CHEWABLE_TABLET | Freq: Once | ORAL | Status: AC
  Administered 2019-12-07: 324 mg via ORAL
  Filled 2019-12-07: qty 4

## 2019-12-07 NOTE — ED Triage Notes (Signed)
Returned to ED for continued pain in chest. States she was in ED last night but LWBS - had lab work and chest xray. States she hurts to move. Denies fevers. States she did take anti-gas meds after leaving this am.

## 2019-12-07 NOTE — ED Triage Notes (Signed)
Pt c/o right upper chest pain radiating into right upper back onset earlier tonight.  Pt st's pain increases with deep breath

## 2019-12-07 NOTE — ED Notes (Signed)
PT in room waiting for EDP to talk to her.

## 2019-12-07 NOTE — ED Provider Notes (Addendum)
[facility] EMERGENCY DEPARTMENT Provider Note   CSN: [id number] Arrival date & time: 12/07/19  [id number]     History Chief Complaint  Patient presents with  . Chest Pain    Emma Medina is a 49 y.o. female.  HPI     Patient presents with concern of chest pain. Patient has a history of cervical cancer, last treatment about 2 months ago, upcoming reassessment, but is otherwise generally well.  She notes that yesterday about 18 hours ago she developed pain mid and right upper chest.  There are some dyspnea associated with this, but no fever, no nausea, vomiting, no abdominal pain. Pain has been worse with activity.  Patient was actually seen here, but left prior to completing her exam about 12 hours ago.  With persistency of her pain, possible increase in severity she presents for evaluation.  Past Medical History:  Diagnosis Date  . Cancer ([place])    cervical  . DVT (deep vein thrombosis) in pregnancy   . Hypertension   . Obstructive thrombus 2019   left leg  . Sciatica    back    Patient Active Problem List   Diagnosis Date Noted  . Screening breast examination 10/16/2019  . Pancytopenia, acquired ([place]) 10/01/2019  . Peripheral neuropathy due to chemotherapy ([place]) 10/01/2019  . Dysuria 08/13/2019  . Hypomagnesemia 08/06/2019  . Leukopenia due to antineoplastic chemotherapy ([place]) 08/06/2019  . Abdominal pain 07/30/2019  . Tinnitus, subjective, bilateral 07/23/2019  . Nausea with vomiting 07/16/2019  . Hypokalemia due to excessive gastrointestinal loss of potassium 07/16/2019  . Diarrhea 07/16/2019  . Peripheral vascular disease of lower extremity ([place]) 06/28/2019  . Malignant neoplasm of overlapping sites of cervix ([place]) 06/01/2019  . Obesity (BMI 35.0-39.9 without comorbidity) 06/01/2019  . At risk for hemorrhage associated with anticoagulation therapy 06/01/2019  . Morbid obesity ([place]) 04/26/2019  . Fibroids 04/26/2019  . Iron deficiency anemia due  to chronic blood loss 04/26/2019  . Obstructive thrombus 05/18/2018  . Ischemia of extremity 05/18/2018    Past Surgical History:  Procedure Laterality Date  . ABDOMINAL AORTOGRAM N/A 05/18/2018   Procedure: ABDOMINAL AORTOGRAM;  Surgeon: Waynetta Sandy, MD;  Location: [facility];  Service: Cardiovascular;  Laterality: N/A;  . ABDOMINAL AORTOGRAM W/LOWER EXTREMITY Left 10/26/2018   Procedure: ABDOMINAL AORTOGRAM W/LOWER EXTREMITY;  Surgeon: Waynetta Sandy, MD;  Location: [facility];  Service: Cardiovascular;  Laterality: Left;  . IR IMAGING GUIDED PORT INSERTION  07/05/2019  . LOWER EXTREMITY ANGIOGRAPHY Bilateral 05/18/2018   Procedure: LOWER EXTREMITY ANGIOGRAPHY;  Surgeon: Waynetta Sandy, MD;  Location: [facility];  Service: Cardiovascular;  Laterality: Bilateral;  . OPERATIVE ULTRASOUND N/A 08/14/2019   Procedure: OPERATIVE ULTRASOUND;  Surgeon: Gery Pray, MD;  Location: [facility];  Service: Urology;  Laterality: N/A;  . OPERATIVE ULTRASOUND N/A 08/20/2019   Procedure: OPERATIVE ULTRASOUND;  Surgeon: Gery Pray, MD;  Location: [facility];  Service: Urology;  Laterality: N/A;  . OPERATIVE ULTRASOUND N/A 08/29/2019   Procedure: OPERATIVE ULTRASOUND;  Surgeon: Gery Pray, MD;  Location: [facility];  Service: Urology;  Laterality: N/A;  . OPERATIVE ULTRASOUND N/A 09/06/2019   Procedure: OPERATIVE ULTRASOUND;  Surgeon: Gery Pray, MD;  Location: [facility];  Service: Urology;  Laterality: N/A;  . OPERATIVE ULTRASOUND N/A 09/11/2019   Procedure: OPERATIVE ULTRASOUND;  Surgeon: Gery Pray, MD;  Location: [facility];  Service: Urology;  Laterality: N/A;  .  TANDEM RING INSERTION N/A 08/14/2019   Procedure: TANDEM RING INSERTION - FIRST;  Surgeon: Gery Pray, MD;  Location: [facility];  Service: Urology;  Laterality: N/A;  . TANDEM  RING INSERTION N/A 08/20/2019   Procedure: TANDEM RING INSERTION;  Surgeon: Gery Pray, MD;  Location: [facility];  Service: Urology;  Laterality: N/A;  . TANDEM RING INSERTION N/A 08/29/2019   Procedure: TANDEM RING INSERTION;  Surgeon: Gery Pray, MD;  Location: [facility];  Service: Urology;  Laterality: N/A;  . TANDEM RING INSERTION N/A 09/06/2019   Procedure: TANDEM RING INSERTION;  Surgeon: Gery Pray, MD;  Location: [facility];  Service: Urology;  Laterality: N/A;  . TANDEM RING INSERTION N/A 09/11/2019   Procedure: TANDEM RING INSERTION;  Surgeon: Gery Pray, MD;  Location: [facility];  Service: Urology;  Laterality: N/A;  . THROMBECTOMY FEMORAL ARTERY Left 05/18/2018   Procedure: LEFT LEG THROMBECTOMY, BALLOON ANGIOPLASTY LEFT POSTERIOR TIBIAL ARTERY;  Surgeon: Waynetta Sandy, MD;  Location: [facility];  Service: Vascular;  Laterality: Left;  . TUBAL LIGATION       OB History    Gravida  8   Para  8   Term  8   Preterm      AB      Living  8     SAB      TAB      Ectopic      Multiple      Live Births  8           Family History  Problem Relation Age of Onset  . Cancer Mother        brain and uterine  . Stroke Brother   . Diabetes Brother   . Clotting disorder Brother   . Breast cancer Neg Hx     Social History   Tobacco Use  . Smoking status: Former Smoker    Packs/day: 1.00    Years: 15.00    Pack years: 15.00    Quit date: 07/02/2016    Years since quitting: 3.4  . Smokeless tobacco: Never Used  Substance Use Topics  . Alcohol use: Yes    Comment: rare  . Drug use: Yes    Frequency: 7.0 times per week    Types: Marijuana    Comment: smokes blunt on occasion last blunt 07-12-2019   Patient smokes marijuana daily Home Medications Prior to Admission medications   Medication Sig Start Date End Date Taking? Authorizing Provider  aspirin EC 81 MG EC tablet Take  1 tablet (81 mg total) by mouth daily. 05/23/18  Yes Ulyses Amor, PA-C  cyclobenzaprine (FLEXERIL) 10 MG tablet Take 1 tablet (10 mg total) by mouth 2 (two) times daily as needed for muscle spasms. 11/30/19  Yes Faustino Congress, NP  gabapentin (NEURONTIN) 300 MG capsule Take 1 capsule (300 mg total) by mouth at bedtime. 11/13/19  Yes Charlott Rakes, MD  XARELTO 20 MG TABS tablet Take 1 tablet by mouth once daily 05/11/19  Yes Waynetta Sandy, MD    Allergies    Patient has no known allergies.  Review of Systems   Review of Systems  Constitutional:       Per HPI, otherwise negative  HENT:       Per HPI, otherwise negative  Respiratory:       Per HPI, otherwise negative  Cardiovascular:       Per HPI, otherwise negative  Gastrointestinal:  Negative for vomiting.  Endocrine:       Negative aside from HPI  Genitourinary:       Neg aside from HPI   Musculoskeletal:       Per HPI, otherwise negative  Skin: Negative.   Allergic/Immunologic: Positive for immunocompromised state.  Neurological: Negative for syncope.    Physical Exam Updated Vital Signs BP 126/87   Pulse 72   Temp 98.3 F (36.8 C) (Oral)   Resp (!) 37   SpO2 98%   Physical Exam Vitals and nursing note reviewed.  Constitutional:      General: She is not in acute distress.    Appearance: She is well-developed.  HENT:     Head: Normocephalic and atraumatic.  Eyes:     Conjunctiva/sclera: Conjunctivae normal.  Cardiovascular:     Rate and Rhythm: Normal rate and regular rhythm.  Pulmonary:     Effort: Pulmonary effort is normal. No respiratory distress.     Breath sounds: Normal breath sounds. No stridor.  Chest:    Abdominal:     General: There is no distension.  Skin:    General: Skin is warm and dry.  Neurological:     Mental Status: She is alert and oriented to person, place, and time.     Cranial Nerves: No cranial nerve deficit.     ED Results / Procedures / Treatments    Labs (all labs ordered are listed, but only abnormal results are displayed) Labs Reviewed  PROTIME-INR - Abnormal; Notable for the following components:      Result Value   Prothrombin Time 17.0 (*)    INR 1.4 (*)    All other components within normal limits  COMPREHENSIVE METABOLIC PANEL  MAGNESIUM  BRAIN NATRIURETIC PEPTIDE  CBC WITH DIFFERENTIAL/PLATELET  D-DIMER, QUANTITATIVE (NOT AT [facility])  CBG MONITORING, ED  I-STAT BETA HCG BLOOD, ED ([facility], [facility], [facility] ONLY)  TROPONIN I (HIGH SENSITIVITY)    EKG EKG Interpretation  Date/Time:  Friday December 07 2019 09:43:35 EST Ventricular Rate:  74 PR Interval:  172 QRS Duration: 94 QT Interval:  414 QTC Calculation: 459 R Axis:   99 Text Interpretation: Normal sinus rhythm Rightward axis Borderline ECG Confirmed by Carmin Muskrat [phone]) on 12/07/2019 11:35:40 AM   Radiology DG Chest 2 View  Result Date: 12/07/2019 CLINICAL DATA:  Chest pain and shortness of breath EXAM: CHEST - 2 VIEW COMPARISON:  09/20/2018 FINDINGS: Cardiac shadow is stable. New right chest wall port is seen. Bibasilar atelectasis is noted right greater than left without sizable effusion. Mild central vascular congestion is noted as well. IMPRESSION: Bibasilar atelectasis with mild vascular congestion. Electronically Signed   By: Inez Catalina M.D.   On: 12/07/2019 02:34   DG Chest Portable 1 View  Result Date: 12/07/2019 CLINICAL DATA:  Chest pain EXAM: PORTABLE CHEST 1 VIEW COMPARISON:  Earlier same day FINDINGS: Right chest wall port catheter is unchanged. Persistent bibasilar atelectasis. No pneumothorax. No pleural effusion. Stable cardiomediastinal contours IMPRESSION: Persistent bibasilar atelectasis.  No new findings. Electronically Signed   By: Macy Mis M.D.   On: 12/07/2019 12:06    Procedures Procedures (including critical care time)  Medications Ordered in ED Medications  aspirin chewable tablet 324 mg (324 mg Oral Given 12/07/19 1243)  ondansetron  (ZOFRAN) injection 4 mg (4 mg Intravenous Given 12/07/19 1254)    ED Course  I have reviewed the triage vital signs and the nursing notes.  Pertinent labs & imaging results that were available  during my care of the patient were reviewed by me and considered in my medical decision making (see chart for details).    MDM Rules/Calculators/A&P                      2:37 PM Patient awake, alert, answers questions easily, is in no distress, has no hemodynamic instability.  Discussed all findings again at length, including reassuring troponin value, nonischemic EKG, x-ray does not demonstrate pneumonia, and with normal D-dimer, and the patient's compliance with her Xarelto, low suspicion for PE, similarly for dissection.  No persistently elevated blood pressure suggesting likely dissection either. Patient recently started gabapentin and has recently stopped using tramadol.  Given this improvement, though the patient does have some mild discomfort, suspicion for musculoskeletal etiology, possibly secondary to the patient's implanted right upper chest wall port. An additional consideration is the patient's daily marijuana use.  Patient has an appointment in 72 hours with a physician for repeat evaluation, is amenable to starting a course of medication, with repeat evaluation at that time, or return precautions.  As above, with no hemodynamic stability, reassuring labs, vitals, patient is appropriate for discharge with outpatient follow-up. Final Clinical Impression(s) / ED Diagnoses Final diagnoses:  Atypical chest pain    Rx / DC Orders ED Discharge Orders         Ordered    naproxen (NAPROSYN) 500 MG tablet  2 times daily     12/07/19 1441           Carmin Muskrat, MD 12/07/19 1443    Carmin Muskrat, MD 12/07/19 1537

## 2019-12-07 NOTE — ED Notes (Signed)
Pt told staff she would LWBS, advised of risks

## 2019-12-07 NOTE — Discharge Instructions (Signed)
As discussed, your evaluation today has been largely reassuring.  But, it is important that you monitor your condition carefully, and do not hesitate to return to the ED if you develop new, or concerning changes in your condition.  Otherwise, please follow-up with your physician in 3 days as scheduled and discuss today's presentation and how you did over the weekend.

## 2019-12-07 NOTE — ED Notes (Signed)
Patient verbalizes understanding of discharge instructions . Opportunity for questions and answers were provided . Armband removed by staff ,Pt discharged from ED. W/C  offered at D/C  and Declined W/C at D/C and was escorted to lobby by RN.  

## 2019-12-07 NOTE — ED Notes (Signed)
IV placed with lab work collection. Labs given to [employer].

## 2019-12-07 NOTE — ED Notes (Signed)
Patient verbalizes understanding of discharge instructions. Opportunity for questioning and answers were provided. Armband removed by staff, pt discharged from ED ambulatory.   

## 2019-12-07 NOTE — ED Notes (Signed)
PT requested to talk to Dr Vanita Panda at time of DC

## 2019-12-10 ENCOUNTER — Encounter (HOSPITAL_COMMUNITY)
Admission: RE | Admit: 2019-12-10 | Discharge: 2019-12-10 | Disposition: A | Discharge status: Home / Self Care | Payer: BLUE CROSS/BLUE SHIELD | Source: Ambulatory Visit | Attending: Hematology and Oncology | Admitting: Hematology and Oncology

## 2019-12-10 ENCOUNTER — Other Ambulatory Visit: Payer: Self-pay

## 2019-12-10 DIAGNOSIS — C538 Malignant neoplasm of overlapping sites of cervix uteri: Secondary | ICD-10-CM

## 2019-12-10 LAB — GLUCOSE, CAPILLARY: Glucose-Capillary: 97 mg/dL (ref 70–99)

## 2019-12-10 MED ORDER — FLUDEOXYGLUCOSE F - 18 (FDG) INJECTION
11.9200 | Freq: Once | INTRAVENOUS | Status: AC | PRN
  Administered 2019-12-10: 11.92 via INTRAVENOUS

## 2019-12-11 ENCOUNTER — Other Ambulatory Visit: Payer: Self-pay | Admitting: Hematology and Oncology

## 2019-12-11 ENCOUNTER — Inpatient Hospital Stay: Payer: BLUE CROSS/BLUE SHIELD | Admitting: Hematology and Oncology

## 2019-12-11 ENCOUNTER — Inpatient Hospital Stay: Payer: BLUE CROSS/BLUE SHIELD | Attending: Gynecologic Oncology | Admitting: Hematology and Oncology

## 2019-12-11 DIAGNOSIS — J189 Pneumonia, unspecified organism: Secondary | ICD-10-CM | POA: Diagnosis not present

## 2019-12-11 DIAGNOSIS — T451X5A Adverse effect of antineoplastic and immunosuppressive drugs, initial encounter: Secondary | ICD-10-CM

## 2019-12-11 DIAGNOSIS — G620 Drug-induced polyneuropathy: Secondary | ICD-10-CM | POA: Diagnosis not present

## 2019-12-11 DIAGNOSIS — C538 Malignant neoplasm of overlapping sites of cervix uteri: Secondary | ICD-10-CM

## 2019-12-11 MED ORDER — PREDNISONE 20 MG PO TABS: 20.0000 mg | ORAL_TABLET | Freq: Every day | ORAL | 0 refills | Status: DC

## 2019-12-12 ENCOUNTER — Encounter: Payer: Self-pay | Admitting: Hematology and Oncology

## 2019-12-12 DIAGNOSIS — J189 Pneumonia, unspecified organism: Secondary | ICD-10-CM | POA: Insufficient documentation

## 2019-12-12 NOTE — Progress Notes (Signed)
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 [facility]) She has complete response to treatment I recommend port removal She will follow-up with radiation oncologist and GYN surgeon in the future  Peripheral neuropathy due to chemotherapy [facility]) She has residual neuropathy from treatment She is taking gabapentin I recommend she continues the same She is aware it can take many months before her neuropathy improves  Inflammation of lung She has inflammatory changes seen on PET scan in her lungs She has some pleuritic chest pain with negative work-up I recommend a course of prednisone for a week to see if that would help She is interested to proceed   No orders of the defined types were placed in this encounter.   INTERVAL HISTORY: Please see below for problem oriented charting. 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  She was placed on Xarelto and aspirin for following this procedure.  She had already been experiencing some irregular menses, however after  commencing anticoagulant therapy her menstrual cycle became even heavier and more irregular with daily bleeding   09/20/2018 Imaging   US pelvis Negative aside from subserosal appearing 2.5 centimeter fibroid at the right fundus.   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 No evidence of thoracic metastasis. 2. Small RIGHT upper lobe pulmonary nodule favored benign.   Abdomen / Pelvis Impression:   1. No direct extension beyond the myometrium of the uterus, lower uterine segment or cervix. 2. No clear evidence metastatic adenopathy in the pelvis. Several subcentimeter iliac lymph nodes are noted. 3. No evidence distant metastatic disease or adenopathy in the abdomen pelvis. 4. Uterus is poorly evaluated by CT. Probable leiomyoma in the anterior wall. 5. No skeletal metastasis.   06/08/2019 Imaging   MRI pelvis 4.4 cm barrel shaped soft tissue mass in the cervix, consistent with known cervical carcinoma. No evidence of parametrial involvement or other pelvic metastatic disease. FIGO stage IB3 by imaging.   Mild hydrometros.   Small uterine fibroids and adenomyosis.   Normal appearance of both ovaries.  No adnexal mass identified.     06/28/2019 Cancer Staging   Staging form: Cervix Uteri, AJCC 8th Edition - Clinical: FIGO Stage IB1 (cT1b1, cN0, cM0) - Signed  by Heath Lark, MD on 06/28/2019   06/28/2019 PET scan   1. Intense hypermetabolic lesion within the lower uterine segment/cervix consistent with known cervical carcinoma. No evidence of extension beyond the myometrium. 2. No hypermetabolic  pelvic lymphadenopathy or periaortic retroperitoneal adenopathy. 3. No evidence of distant metastatic disease or skeletal disease.       07/05/2019 Procedure   Successful placement of a right internal jugular approach power injectable Port-A-Cath. The catheter is ready for immediate use.      Genetic Testing   Patient has genetic testing done for PD-L1 on 06/01/19 biopsy. Results revealed: PD-L1 0%    07/10/2019 -  Chemotherapy   The patient had cisplatin for chemotherapy treatment.     12/10/2019 PET scan   1. No residual abnormal hypermetabolism associated with the lower uterine segment/cervix. There is vaginal wall thickening and presacral edema, which may be treatment related. No evidence of metastatic disease. 2. Focal uptake along the floor of the mouth without CT correlate. 3. Cholelithiasis. 4.  Aortic atherosclerosis (ICD10-I70.0).     REVIEW OF SYSTEMS:   Constitutional: Denies fevers, chills or abnormal weight loss Eyes: Denies blurriness of vision Ears, nose, mouth, throat, and face: Denies mucositis or sore throat Gastrointestinal:  Denies nausea, heartburn or change in bowel habits Skin: Denies abnormal skin rashes Lymphatics: Denies new lymphadenopathy or easy bruising Behavioral/Psych: Mood is stable, no new changes  Extremities: No lower extremity edema All other systems were reviewed with the patient and are negative.  I have reviewed the past medical history, past surgical history, social history and family history with the patient and they are unchanged from previous note.  ALLERGIES:  has No Known Allergies.  MEDICATIONS:  Current Outpatient Medications  Medication Sig Dispense Refill   aspirin EC 81 MG EC tablet Take 1 tablet (81 mg total) by mouth daily.     cyclobenzaprine (FLEXERIL) 10 MG tablet Take 1 tablet (10 mg total) by mouth 2 (two) times daily as needed for muscle spasms. 20 tablet 0   gabapentin (NEURONTIN) 300 MG capsule Take 1 capsule  (300 mg total) by mouth at bedtime. 30 capsule 3   predniSONE (DELTASONE) 20 MG tablet Take 1 tablet (20 mg total) by mouth daily with breakfast. 7 tablet 0   XARELTO 20 MG TABS tablet Take 1 tablet by mouth once daily 30 tablet 6   No current facility-administered medications for this visit.    PHYSICAL EXAMINATION: ECOG PERFORMANCE STATUS: 1 - Symptomatic but completely ambulatory  LABORATORY DATA:  I have reviewed the data as listed CMP Latest Ref Rng & Units 12/07/2019 12/07/2019 11/13/2019  Glucose 70 - 99 mg/dL 99 106(H) 87  BUN 6 - 20 mg/dL '7 8 8  '$ Creatinine 0.44 - 1.00 mg/dL 0.87 0.88 0.82  Sodium 135 - 145 mmol/L 140 141 145(H)  Potassium 3.5 - 5.1 mmol/L 4.0 3.5 4.4  Chloride 98 - 111 mmol/L 104 106 108(H)  CO2 22 - 32 mmol/L 23 21(L) 19(L)  Calcium 8.9 - 10.3 mg/dL 9.5 9.5 9.7  Total Protein 6.5 - 8.1 g/dL 7.4 - -  Total Bilirubin 0.3 - 1.2 mg/dL 0.6 - -  Alkaline Phos 38 - 126 U/L 74 - -  AST 15 - 41 U/L 18 - -  ALT 0 - 44 U/L 13 - -    Lab Results  Component Value Date   WBC 7.8 12/07/2019   HGB 12.7 12/07/2019   HCT 39.7 12/07/2019   MCV 89.8 12/07/2019  PLT 296 12/07/2019   NEUTROABS 5.6 12/07/2019     RADIOGRAPHIC STUDIES: I have personally reviewed the radiological images as listed and agreed with the findings in the report. DG Chest 2 View  Result Date: 12/07/2019 CLINICAL DATA:  Chest pain and shortness of breath EXAM: CHEST - 2 VIEW COMPARISON:  09/20/2018 FINDINGS: Cardiac shadow is stable. New right chest wall port is seen. Bibasilar atelectasis is noted right greater than left without sizable effusion. Mild central vascular congestion is noted as well. IMPRESSION: Bibasilar atelectasis with mild vascular congestion. Electronically Signed   By: Inez Catalina M.D.   On: 12/07/2019 02:34   DG Elbow Complete Left  Result Date: 11/30/2019 CLINICAL DATA:  Posterior left elbow pain after injury, limited range of motion EXAM: LEFT ELBOW - COMPLETE 3+ VIEW  COMPARISON:  02/20/2019 FINDINGS: There is no evidence of fracture, dislocation, or joint effusion. Enthesopathic changes at the medial epicondyle. Mild degenerative changes at the anterior aspect of the ulnotrochlear joint. Mild soft tissue swelling the posterior aspect of the elbow. IMPRESSION: 1. No acute osseous abnormality of the left elbow. 2. Mild soft tissue swelling. Electronically Signed   By: Davina Poke D.O.   On: 11/30/2019 10:41   NM PET Image Restag (PS) Skull Base To Thigh  Result Date: 12/10/2019 CLINICAL DATA:  Subsequent treatment strategy for uterine/cervical cancer. EXAM: NUCLEAR MEDICINE PET SKULL BASE TO THIGH TECHNIQUE: 11.9 mCi F-18 FDG was injected intravenously. Full-ring PET imaging was performed from the skull base to thigh after the radiotracer. CT data was obtained and used for attenuation correction and anatomic localization. Fasting blood glucose: 97 mg/dl COMPARISON:  06/28/2019, CT chest abdomen pelvis 06/05/2019. FINDINGS: Mediastinal blood pool activity: SUV max 2.5 Liver activity: SUV max NA NECK: Focal uptake along the floor of the mouth without a CT correlate. No hypermetabolic lymph nodes. Incidental CT findings: None. CHEST: No hypermetabolic mediastinal, hilar or axillary lymph nodes. Vague patchy hypermetabolism in both lower lobes, right greater than left, associated with atelectasis. No discrete nodules or masses. Incidental CT findings: Right IJ Port-A-Cath terminates in the right atrium. Heart is mildly enlarged. No pericardial or pleural effusion. Image quality is degraded by respiratory motion upon review of the lungs. ABDOMEN/PELVIS: No abnormal hypermetabolism in the liver, adrenal glands, spleen or pancreas. No residual abnormal hypermetabolism in the lower uterine segment/cervix. No hypermetabolic lymph nodes. Incidental CT findings: Liver is unremarkable. Stones fill the gallbladder. Adrenal glands, kidneys, spleen, pancreas, stomach and bowel are  grossly unremarkable. Umbilical hernia contains omental fat. Atherosclerotic calcification of the aorta. Presacral edema. Thickening of the vaginal wall. SKELETON: No abnormal osseous hypermetabolism. Incidental CT findings: None. IMPRESSION: 1. No residual abnormal hypermetabolism associated with the lower uterine segment/cervix. There is vaginal wall thickening and presacral edema, which may be treatment related. No evidence of metastatic disease. 2. Focal uptake along the floor of the mouth without CT correlate. 3. Cholelithiasis. 4.  Aortic atherosclerosis (ICD10-I70.0). Electronically Signed   By: Lorin Picket M.D.   On: 12/10/2019 10:46   DG Chest Portable 1 View  Result Date: 12/07/2019 CLINICAL DATA:  Chest pain EXAM: PORTABLE CHEST 1 VIEW COMPARISON:  Earlier same day FINDINGS: Right chest wall port catheter is unchanged. Persistent bibasilar atelectasis. No pneumothorax. No pleural effusion. Stable cardiomediastinal contours IMPRESSION: Persistent bibasilar atelectasis.  No new findings. Electronically Signed   By: Macy Mis M.D.   On: 12/07/2019 12:06    I discussed the assessment and treatment plan with the patient. The patient  was provided an opportunity to ask questions and all were answered. The patient agreed with the plan and demonstrated an understanding of the instructions. The patient was advised to call back or seek an in-person evaluation if the symptoms worsen or if the condition fails to improve as anticipated.    I spent 20 minutes for the appointment reviewing test results, discuss management and coordination of care.  Heath Lark, MD 12/12/2019 9:51 AM

## 2019-12-12 NOTE — Assessment & Plan Note (Signed)
She has inflammatory changes seen on PET scan in her lungs She has some pleuritic chest pain with negative work-up I recommend a course of prednisone for a week to see if that would help She is interested to proceed

## 2019-12-12 NOTE — Assessment & Plan Note (Signed)
She has residual neuropathy from treatment She is taking gabapentin I recommend she continues the same She is aware it can take many months before her neuropathy improves

## 2019-12-12 NOTE — Assessment & Plan Note (Signed)
She has complete response to treatment I recommend port removal She will follow-up with radiation oncologist and GYN surgeon in the future

## 2019-12-18 ENCOUNTER — Telehealth: Payer: Self-pay

## 2019-12-18 NOTE — Telephone Encounter (Signed)
-----   Message from Heath Lark, MD sent at 12/18/2019 10:07 AM EDT ----- Regarding: chest discomfort Can you call and ask if she feel better with prednisone?

## 2019-12-18 NOTE — Telephone Encounter (Signed)
She should not have diarrhea so far away from treatment She can take imodium but I recommend she sees GI doctor for evaluation

## 2019-12-18 NOTE — Telephone Encounter (Signed)
Called and given below message. She verbalized understanding. She feels better with chest discomfort. She said the Prednisone is helping. The discomfort is intermittent.   She ask about her diarrhea. She has 4-5 diarrhea bm's per day. If she takes imodium once a day she will have x 1 episode. She is asking for your input on the imodium/diarrhea.

## 2019-12-18 NOTE — Telephone Encounter (Signed)
Called and given below message. She verbalized understanding. 

## 2019-12-23 ENCOUNTER — Other Ambulatory Visit: Payer: Self-pay | Admitting: Radiology

## 2019-12-25 ENCOUNTER — Other Ambulatory Visit: Payer: Self-pay

## 2019-12-25 ENCOUNTER — Ambulatory Visit (HOSPITAL_COMMUNITY)
Admission: RE | Admit: 2019-12-25 | Discharge: 2019-12-25 | Disposition: A | Discharge status: Home / Self Care | Payer: BLUE CROSS/BLUE SHIELD | Source: Ambulatory Visit | Attending: Hematology and Oncology | Admitting: Hematology and Oncology

## 2019-12-25 ENCOUNTER — Encounter (HOSPITAL_COMMUNITY): Payer: Self-pay

## 2019-12-25 DIAGNOSIS — Z452 Encounter for adjustment and management of vascular access device: Secondary | ICD-10-CM | POA: Insufficient documentation

## 2019-12-25 DIAGNOSIS — Z808 Family history of malignant neoplasm of other organs or systems: Secondary | ICD-10-CM | POA: Insufficient documentation

## 2019-12-25 DIAGNOSIS — I10 Essential (primary) hypertension: Secondary | ICD-10-CM | POA: Insufficient documentation

## 2019-12-25 DIAGNOSIS — C538 Malignant neoplasm of overlapping sites of cervix uteri: Secondary | ICD-10-CM | POA: Insufficient documentation

## 2019-12-25 DIAGNOSIS — Z87891 Personal history of nicotine dependence: Secondary | ICD-10-CM | POA: Insufficient documentation

## 2019-12-25 DIAGNOSIS — Z832 Family history of diseases of the blood and blood-forming organs and certain disorders involving the immune mechanism: Secondary | ICD-10-CM | POA: Insufficient documentation

## 2019-12-25 DIAGNOSIS — Z86718 Personal history of other venous thrombosis and embolism: Secondary | ICD-10-CM | POA: Insufficient documentation

## 2019-12-25 DIAGNOSIS — Z8049 Family history of malignant neoplasm of other genital organs: Secondary | ICD-10-CM | POA: Insufficient documentation

## 2019-12-25 DIAGNOSIS — Z7982 Long term (current) use of aspirin: Secondary | ICD-10-CM | POA: Insufficient documentation

## 2019-12-25 DIAGNOSIS — Z79899 Other long term (current) drug therapy: Secondary | ICD-10-CM | POA: Insufficient documentation

## 2019-12-25 HISTORY — PX: IR REMOVAL TUN ACCESS W/ PORT W/O FL MOD SED: IMG2290

## 2019-12-25 LAB — CBC WITH DIFFERENTIAL/PLATELET
Abs Immature Granulocytes: 0.03 10*3/uL (ref 0.00–0.07)
Basophils Absolute: 0.1 10*3/uL (ref 0.0–0.1)
Basophils Relative: 1 %
Eosinophils Absolute: 0.5 10*3/uL (ref 0.0–0.5)
Eosinophils Relative: 10 %
HCT: 40.9 % (ref 36.0–46.0)
Hemoglobin: 13.1 g/dL (ref 12.0–15.0)
Immature Granulocytes: 1 %
Lymphocytes Relative: 22 %
Lymphs Abs: 1 10*3/uL (ref 0.7–4.0)
MCH: 27.6 pg (ref 26.0–34.0)
MCHC: 32 g/dL (ref 30.0–36.0)
MCV: 86.3 fL (ref 80.0–100.0)
Monocytes Absolute: 0.5 10*3/uL (ref 0.1–1.0)
Monocytes Relative: 12 %
Neutro Abs: 2.5 10*3/uL (ref 1.7–7.7)
Neutrophils Relative %: 54 %
Platelets: 288 10*3/uL (ref 150–400)
RBC: 4.74 MIL/uL (ref 3.87–5.11)
RDW: 13.1 % (ref 11.5–15.5)
WBC: 4.6 10*3/uL (ref 4.0–10.5)
nRBC: 0 % (ref 0.0–0.2)

## 2019-12-25 LAB — PROTIME-INR
INR: 1 (ref 0.8–1.2)
Prothrombin Time: 13 seconds (ref 11.4–15.2)

## 2019-12-25 MED ORDER — FENTANYL CITRATE (PF) 100 MCG/2ML IJ SOLN
INTRAMUSCULAR | Status: AC | PRN
  Administered 2019-12-25: 50 ug via INTRAVENOUS

## 2019-12-25 MED ORDER — CEFAZOLIN SODIUM-DEXTROSE 2-4 GM/100ML-% IV SOLN
INTRAVENOUS | Status: AC
  Administered 2019-12-25: 2 g via INTRAVENOUS
  Filled 2019-12-25: qty 100

## 2019-12-25 MED ORDER — FENTANYL CITRATE (PF) 100 MCG/2ML IJ SOLN
INTRAMUSCULAR | Status: AC
  Filled 2019-12-25: qty 2

## 2019-12-25 MED ORDER — LIDOCAINE-EPINEPHRINE 1 %-1:100000 IJ SOLN
INTRAMUSCULAR | Status: AC
  Filled 2019-12-25: qty 1

## 2019-12-25 MED ORDER — SODIUM CHLORIDE 0.9 % IV SOLN: INTRAVENOUS | Status: DC

## 2019-12-25 MED ORDER — CEFAZOLIN SODIUM-DEXTROSE 2-4 GM/100ML-% IV SOLN: 2.0000 g | INTRAVENOUS | Status: AC

## 2019-12-25 MED ORDER — MIDAZOLAM HCL 2 MG/2ML IJ SOLN
INTRAMUSCULAR | Status: AC
  Filled 2019-12-25: qty 2

## 2019-12-25 MED ORDER — MIDAZOLAM HCL 2 MG/2ML IJ SOLN
INTRAMUSCULAR | Status: AC | PRN
  Administered 2019-12-25: 1 mg via INTRAVENOUS

## 2019-12-25 NOTE — Procedures (Signed)
Interventional Radiology Procedure Note  Procedure: Portacatheter removal  Complications: None  Estimated Blood Loss: None  Recommendations: - DC home   Signed,  Karmela Bram K. Malina Geers, MD   

## 2019-12-25 NOTE — Discharge Instructions (Signed)
Please call Interventional Radiology clinic [phone] with any questions or concerns about your port.  You may remove your dressing and shower tomorrow.   Moderate Conscious Sedation, Adult, Care After These instructions provide you with information about caring for yourself after your procedure. Your health care provider may also give you more specific instructions. Your treatment has been planned according to current medical practices, but problems sometimes occur. Call your health care provider if you have any problems or questions after your procedure. What can I expect after the procedure? After your procedure, it is common:  To feel sleepy for several hours.  To feel clumsy and have poor balance for several hours.  To have poor judgment for several hours.  To vomit if you eat too soon. Follow these instructions at home: For at least 24 hours after the procedure:   Do not: ? Participate in activities where you could fall or become injured. ? Drive. ? Use heavy machinery. ? Drink alcohol. ? Take sleeping pills or medicines that cause drowsiness. ? Make important decisions or sign legal documents. ? Take care of children on your own.  Rest. Eating and drinking  Follow the diet recommended by your health care provider.  If you vomit: ? Drink water, juice, or soup when you can drink without vomiting. ? Make sure you have little or no nausea before eating solid foods. General instructions  Have a responsible adult stay with you until you are awake and alert.  Take over-the-counter and prescription medicines only as told by your health care provider.  If you smoke, do not smoke without supervision.  Keep all follow-up visits as told by your health care provider. This is important. Contact a health care provider if:  You keep feeling nauseous or you keep vomiting.  You feel light-headed.  You develop a rash.  You have a fever. Get help right away if:  You  have trouble breathing. This information is not intended to replace advice given to you by your health care provider. Make sure you discuss any questions you have with your health care provider. Document Revised: 09/02/2017 Document Reviewed: 01/10/2016 Elsevier Patient Education  2020 [place] Removal, Care After This sheet gives you information about how to care for yourself after your procedure. Your health care provider may also give you more specific instructions. If you have problems or questions, contact your health care provider. What can I expect after the procedure? After the procedure, it is common to have:  Soreness or pain near your incision.  Some swelling or bruising near your incision. Follow these instructions at home: Medicines  Take over-the-counter and prescription medicines only as told by your health care provider.  If you were prescribed an antibiotic medicine, take it as told by your health care provider. Do not stop taking the antibiotic even if you start to feel better. Bathing  Do not take baths, swim, or use a hot tub until your health care provider approves. Ask your health care provider if you can take showers. You may only be allowed to take sponge baths. Incision care   Follow instructions from your health care provider about how to take care of your incision. Make sure you: ? Wash your hands with soap and water before you change your bandage (dressing). If soap and water are not available, use hand sanitizer. ? Change your dressing as told by your health care provider. ? Keep your dressing dry. ? Leave stitches (sutures), skin  glue, or adhesive strips in place. These skin closures may need to stay in place for 2 weeks or longer. If adhesive strip edges start to loosen and curl up, you may trim the loose edges. Do not remove adhesive strips completely unless your health care provider tells you to do that.  Check your incision area  every day for signs of infection. Check for: ? More redness, swelling, or pain. ? More fluid or blood. ? Warmth. ? Pus or a bad smell. Driving   Do not drive for 24 hours if you were given a medicine to help you relax (sedative) during your procedure.  If you did not receive a sedative, ask your health care provider when it is safe to drive. Activity  Return to your normal activities as told by your health care provider. Ask your health care provider what activities are safe for you.  Do not lift anything that is heavier than 10 lb (4.5 kg), or the limit that you are told, until your health care provider says that it is safe.  Do not do activities that involve lifting your arms over your head. General instructions  Do not use any products that contain nicotine or tobacco, such as cigarettes and e-cigarettes. These can delay healing. If you need help quitting, ask your health care provider.  Keep all follow-up visits as told by your health care provider. This is important. Contact a health care provider if:  You have more redness, swelling, or pain around your incision.  You have more fluid or blood coming from your incision.  Your incision feels warm to the touch.  You have pus or a bad smell coming from your incision.  You have pain that is not relieved by your pain medicine. Get help right away if you have:  A fever or chills.  Chest pain.  Difficulty breathing. Summary  After the procedure, it is common to have pain, soreness, swelling, or bruising near your incision.  If you were prescribed an antibiotic medicine, take it as told by your health care provider. Do not stop taking the antibiotic even if you start to feel better.  Do not drive for 24 hours if you were given a sedative during your procedure.  Return to your normal activities as told by your health care provider. Ask your health care provider what activities are safe for you. This information is not  intended to replace advice given to you by your health care provider. Make sure you discuss any questions you have with your health care provider. Document Revised: 11/03/2017 Document Reviewed: 11/03/2017 Elsevier Patient Education  2020 [employer].

## 2019-12-25 NOTE — Consult Note (Signed)
Chief Complaint: Port removal no longer needed  Referring Physician(s): [place]  Supervising Physician: Jacqulynn Cadet  Patient Status: [facility] - Out-pt  History of Present Illness: Emma Medina is a 49 y.o. female History of SCC of cervix, DVT (on xarelto). Patient has completed her chemotherapy. Team is requesting portacath removal no longer needed.  Past Medical History:  Diagnosis Date  . Cancer ([place])    cervical  . DVT (deep vein thrombosis) in pregnancy   . Hypertension   . Obstructive thrombus 2019   left leg  . Sciatica    back    Past Surgical History:  Procedure Laterality Date  . ABDOMINAL AORTOGRAM N/A 05/18/2018   Procedure: ABDOMINAL AORTOGRAM;  Surgeon: Waynetta Sandy, MD;  Location: [place] [facility];  Service: Cardiovascular;  Laterality: N/A;  . ABDOMINAL AORTOGRAM W/LOWER EXTREMITY Left 10/26/2018   Procedure: ABDOMINAL AORTOGRAM W/LOWER EXTREMITY;  Surgeon: Waynetta Sandy, MD;  Location: [place] [facility];  Service: Cardiovascular;  Laterality: Left;  . IR IMAGING GUIDED PORT INSERTION  07/05/2019  . LOWER EXTREMITY ANGIOGRAPHY Bilateral 05/18/2018   Procedure: LOWER EXTREMITY ANGIOGRAPHY;  Surgeon: Waynetta Sandy, MD;  Location: [facility];  Service: Cardiovascular;  Laterality: Bilateral;  . OPERATIVE ULTRASOUND N/A 08/14/2019   Procedure: OPERATIVE ULTRASOUND;  Surgeon: Gery Pray, MD;  Location: [facility];  Service: Urology;  Laterality: N/A;  . OPERATIVE ULTRASOUND N/A 08/20/2019   Procedure: OPERATIVE ULTRASOUND;  Surgeon: Gery Pray, MD;  Location: [facility];  Service: Urology;  Laterality: N/A;  . OPERATIVE ULTRASOUND N/A 08/29/2019   Procedure: OPERATIVE ULTRASOUND;  Surgeon: Gery Pray, MD;  Location: [facility];  Service: Urology;  Laterality: N/A;  . OPERATIVE ULTRASOUND N/A 09/06/2019   Procedure: OPERATIVE ULTRASOUND;  Surgeon: Gery Pray, MD;  Location: [facility];  Service: Urology;  Laterality: N/A;  . OPERATIVE ULTRASOUND N/A 09/11/2019   Procedure: OPERATIVE ULTRASOUND;  Surgeon: Gery Pray, MD;  Location: [facility];  Service: Urology;  Laterality: N/A;  . TANDEM RING INSERTION N/A 08/14/2019   Procedure: TANDEM RING INSERTION - FIRST;  Surgeon: Gery Pray, MD;  Location: [facility];  Service: Urology;  Laterality: N/A;  . TANDEM RING INSERTION N/A 08/20/2019   Procedure: TANDEM RING INSERTION;  Surgeon: Gery Pray, MD;  Location: [facility];  Service: Urology;  Laterality: N/A;  . TANDEM RING INSERTION N/A 08/29/2019   Procedure: TANDEM RING INSERTION;  Surgeon: Gery Pray, MD;  Location: [facility];  Service: Urology;  Laterality: N/A;  . TANDEM RING INSERTION N/A 09/06/2019   Procedure: TANDEM RING INSERTION;  Surgeon: Gery Pray, MD;  Location: [facility];  Service: Urology;  Laterality: N/A;  . TANDEM RING INSERTION N/A 09/11/2019   Procedure: TANDEM RING INSERTION;  Surgeon: Gery Pray, MD;  Location: [facility];  Service: Urology;  Laterality: N/A;  . THROMBECTOMY FEMORAL ARTERY Left 05/18/2018   Procedure: LEFT LEG THROMBECTOMY, BALLOON ANGIOPLASTY LEFT POSTERIOR TIBIAL ARTERY;  Surgeon: Waynetta Sandy, MD;  Location: [place];  Service: Vascular;  Laterality: Left;  . TUBAL LIGATION      Allergies: Patient has no known allergies.  Medications: Prior to Admission medications   Medication Sig Start Date End Date Taking? Authorizing Provider  cyclobenzaprine (FLEXERIL) 10 MG tablet Take 1 tablet (10 mg total) by mouth 2 (two) times daily as needed for muscle spasms. 11/30/19  Yes Zigmund Daniel,  Colletta Maryland, NP  gabapentin (NEURONTIN) 300 MG capsule Take 1 capsule (300 mg total) by mouth at bedtime. 11/13/19  Yes Charlott Rakes, MD  aspirin EC 81 MG EC tablet Take 1 tablet (81 mg  total) by mouth daily. 05/23/18   Ulyses Amor, PA-C  predniSONE (DELTASONE) 20 MG tablet Take 1 tablet (20 mg total) by mouth daily with breakfast. 12/11/19   Heath Lark, MD  XARELTO 20 MG TABS tablet Take 1 tablet by mouth once daily 05/11/19   Waynetta Sandy, MD     Family History  Problem Relation Age of Onset  . Cancer Mother        brain and uterine  . Stroke Brother   . Diabetes Brother   . Clotting disorder Brother   . Breast cancer Neg Hx     Social History   Socioeconomic History  . Marital status: Single    Spouse name: Not on file  . Number of children: Not on file  . Years of education: Not on file  . Highest education level: 12th grade  Occupational History  . Not on file  Tobacco Use  . Smoking status: Former Smoker    Packs/day: 1.00    Years: 15.00    Pack years: 15.00    Quit date: 07/02/2016    Years since quitting: 3.4  . Smokeless tobacco: Never Used  Substance and Sexual Activity  . Alcohol use: Yes    Comment: rare  . Drug use: Yes    Frequency: 7.0 times per week    Types: Marijuana    Comment: smokes blunt on occasion last blunt 07-12-2019  . Sexual activity: Yes    Birth control/protection: Surgical  Other Topics Concern  . Not on file  Social History Narrative  . Not on file   Social Determinants of Health   Financial Resource Strain:   . Difficulty of Paying Living Expenses:   Food Insecurity:   . Worried About Charity fundraiser in the Last Year:   . Arboriculturist in the Last Year:   Transportation Needs: No Transportation Needs  . Lack of Transportation (Medical): No  . Lack of Transportation (Non-Medical): No  Physical Activity:   . Days of Exercise per Week:   . Minutes of Exercise per Session:   Stress:   . Feeling of Stress :   Social Connections:   . Frequency of Communication with Friends and Family:   . Frequency of Social Gatherings with Friends and Family:   . Attends Religious Services:   . Active  Member of Clubs or Organizations:   . Attends Archivist Meetings:   Marland Kitchen Marital Status:      Review of Systems: A 12 point ROS discussed and pertinent positives are indicated in the HPI above.  All other systems are negative.  Review of Systems  Constitutional: Negative for fatigue and fever.  HENT: Negative for congestion.   Respiratory: Negative for cough and shortness of breath.   Gastrointestinal: Negative for abdominal pain, diarrhea, nausea and vomiting.    Vital Signs: BP (!) 129/110   Physical Exam Vitals and nursing note reviewed.  Constitutional:      Appearance: She is well-developed.  HENT:     Head: Normocephalic and atraumatic.  Eyes:     Conjunctiva/sclera: Conjunctivae normal.  Cardiovascular:     Rate and Rhythm: Normal rate and regular rhythm.     Heart sounds: Normal heart sounds.  Pulmonary:  Effort: Pulmonary effort is normal.     Breath sounds: Normal breath sounds.  Musculoskeletal:        General: Normal range of motion.     Cervical back: Normal range of motion.  Skin:    General: Skin is warm.  Neurological:     Mental Status: She is alert and oriented to person, place, and time.     Imaging: DG Chest 2 View  Result Date: 12/07/2019 CLINICAL DATA:  Chest pain and shortness of breath EXAM: CHEST - 2 VIEW COMPARISON:  09/20/2018 FINDINGS: Cardiac shadow is stable. New right chest wall port is seen. Bibasilar atelectasis is noted right greater than left without sizable effusion. Mild central vascular congestion is noted as well. IMPRESSION: Bibasilar atelectasis with mild vascular congestion. Electronically Signed   By: Inez Catalina M.D.   On: 12/07/2019 02:34   DG Elbow Complete Left  Result Date: 11/30/2019 CLINICAL DATA:  Posterior left elbow pain after injury, limited range of motion EXAM: LEFT ELBOW - COMPLETE 3+ VIEW COMPARISON:  02/20/2019 FINDINGS: There is no evidence of fracture, dislocation, or joint effusion. Enthesopathic  changes at the medial epicondyle. Mild degenerative changes at the anterior aspect of the ulnotrochlear joint. Mild soft tissue swelling the posterior aspect of the elbow. IMPRESSION: 1. No acute osseous abnormality of the left elbow. 2. Mild soft tissue swelling. Electronically Signed   By: Davina Poke D.O.   On: 11/30/2019 10:41   NM PET Image Restag (PS) Skull Base To Thigh  Result Date: 12/10/2019 CLINICAL DATA:  Subsequent treatment strategy for uterine/cervical cancer. EXAM: NUCLEAR MEDICINE PET SKULL BASE TO THIGH TECHNIQUE: 11.9 mCi F-18 FDG was injected intravenously. Full-ring PET imaging was performed from the skull base to thigh after the radiotracer. CT data was obtained and used for attenuation correction and anatomic localization. Fasting blood glucose: 97 mg/dl COMPARISON:  06/28/2019, CT chest abdomen pelvis 06/05/2019. FINDINGS: Mediastinal blood pool activity: SUV max 2.5 Liver activity: SUV max NA NECK: Focal uptake along the floor of the mouth without a CT correlate. No hypermetabolic lymph nodes. Incidental CT findings: None. CHEST: No hypermetabolic mediastinal, hilar or axillary lymph nodes. Vague patchy hypermetabolism in both lower lobes, right greater than left, associated with atelectasis. No discrete nodules or masses. Incidental CT findings: Right IJ Port-A-Cath terminates in the right atrium. Heart is mildly enlarged. No pericardial or pleural effusion. Image quality is degraded by respiratory motion upon review of the lungs. ABDOMEN/PELVIS: No abnormal hypermetabolism in the liver, adrenal glands, spleen or pancreas. No residual abnormal hypermetabolism in the lower uterine segment/cervix. No hypermetabolic lymph nodes. Incidental CT findings: Liver is unremarkable. Stones fill the gallbladder. Adrenal glands, kidneys, spleen, pancreas, stomach and bowel are grossly unremarkable. Umbilical hernia contains omental fat. Atherosclerotic calcification of the aorta. Presacral  edema. Thickening of the vaginal wall. SKELETON: No abnormal osseous hypermetabolism. Incidental CT findings: None. IMPRESSION: 1. No residual abnormal hypermetabolism associated with the lower uterine segment/cervix. There is vaginal wall thickening and presacral edema, which may be treatment related. No evidence of metastatic disease. 2. Focal uptake along the floor of the mouth without CT correlate. 3. Cholelithiasis. 4.  Aortic atherosclerosis (ICD10-I70.0). Electronically Signed   By: Lorin Picket M.D.   On: 12/10/2019 10:46   DG Chest Portable 1 View  Result Date: 12/07/2019 CLINICAL DATA:  Chest pain EXAM: PORTABLE CHEST 1 VIEW COMPARISON:  Earlier same day FINDINGS: Right chest wall port catheter is unchanged. Persistent bibasilar atelectasis. No pneumothorax. No pleural effusion. Stable  cardiomediastinal contours IMPRESSION: Persistent bibasilar atelectasis.  No new findings. Electronically Signed   By: Macy Mis M.D.   On: 12/07/2019 12:06    Labs:  CBC: Recent Labs    10/01/19 1221 12/07/19 0217 12/07/19 1225 12/25/19 1211  WBC 3.4* 7.2 7.8 4.6  HGB 10.0* 13.1 12.7 13.1  HCT 29.8* 40.0 39.7 40.9  PLT 226 314 296 288    COAGS: Recent Labs    07/05/19 1230 12/07/19 1225 12/25/19 1211  INR 1.0 1.4* 1.0    BMP: Recent Labs    10/01/19 1221 11/13/19 1131 12/07/19 0217 12/07/19 1225  NA 142 145* 141 140  K 3.3* 4.4 3.5 4.0  CL 108 108* 106 104  CO2 25 19* 21* 23  GLUCOSE 139* 87 106* 99  BUN 7 8 8 7   CALCIUM 8.5* 9.7 9.5 9.5  CREATININE 0.83 0.82 0.88 0.87  GFRNONAA >60 84 >60 >60  GFRAA >60 96 >60 >60    LIVER FUNCTION TESTS: Recent Labs    12/07/19 1225  BILITOT 0.6  AST 18  ALT 13  ALKPHOS 74  PROT 7.4  ALBUMIN 3.9   Assessment and Plan:  49 y.o, female outpatient. History of SCC of cervix, DVT (on xarelto). Patient has completed her chemotherapy. Team is requesting portacath removal no longer needed.  Pertinent Imaging 3.8.21 - PET  schan shows no hyermetabolic area  Pertinent IR History 10.20.20 - Port placement single lumen RIJ  Pertinent Allergies NKDA   All labs  are within acceptable parameters. patient is on xarelto last dose given on 3.21.21  Patient is afebrile.  Risks and benefits of image guided port-a-catheter removal t was discussed with the patient including, but not limited to bleeding, infection, pneumothorax, and need for additional procedures.  All of the patient's questions were answered, patient is agreeable to proceed. Consent signed and in chart.    Thank you for this interesting consult.  I greatly enjoyed meeting DOREN LAMOTTE and look forward to participating in their care.  A copy of this report was sent to the requesting provider on this date.  Electronically Signed: Avel Peace, NP 12/25/2019, 1:35 PM   I spent a total of  40 Minutes   in face to face in clinical consultation, greater than 50% of which was counseling/coordinating care for portacath removal

## 2020-01-08 ENCOUNTER — Telehealth (HOSPITAL_COMMUNITY): Payer: Self-pay

## 2020-01-08 NOTE — Telephone Encounter (Signed)

## 2020-01-09 ENCOUNTER — Other Ambulatory Visit: Payer: Self-pay | Admitting: *Deleted

## 2020-01-09 DIAGNOSIS — I779 Disorder of arteries and arterioles, unspecified: Secondary | ICD-10-CM

## 2020-01-10 ENCOUNTER — Encounter (HOSPITAL_COMMUNITY): Payer: BLUE CROSS/BLUE SHIELD

## 2020-01-10 ENCOUNTER — Ambulatory Visit: Payer: BLUE CROSS/BLUE SHIELD

## 2020-01-31 ENCOUNTER — Ambulatory Visit (HOSPITAL_COMMUNITY)
Admission: RE | Admit: 2020-01-31 | Discharge: 2020-01-31 | Disposition: A | Discharge status: Home / Self Care | Payer: Self-pay | Source: Ambulatory Visit | Attending: Vascular Surgery | Admitting: Vascular Surgery

## 2020-01-31 ENCOUNTER — Other Ambulatory Visit: Payer: Self-pay

## 2020-01-31 ENCOUNTER — Ambulatory Visit (INDEPENDENT_AMBULATORY_CARE_PROVIDER_SITE_OTHER): Payer: Self-pay | Admitting: Physician Assistant

## 2020-01-31 VITALS — BP 118/85 | HR 81 | Temp 97.0°F | Resp 18 | Ht 66.0 in | Wt 232.0 lb

## 2020-01-31 DIAGNOSIS — I779 Disorder of arteries and arterioles, unspecified: Secondary | ICD-10-CM

## 2020-01-31 NOTE — Progress Notes (Signed)
Office Note     CC:  follow up Requesting Provider:  Marliss Coots, NP  HPI: Emma Medina is a 49 y.o. (July 01, 1971) female who presents for follow up of peripheral vascular disease. She is status post angiogram on 10/26/18 by Dr. Donzetta Matters for pain and numbness in her left foot with TBI of 0. At time of Angiogram she was found to have patent aorta and iliac segments. Left lower extremity with brisk flow through the profunda and SFA to the level of the popliteal artery with 30% stenosis likely associated with previous thrombectomy site.  Runoff via the anterior tibial artery in line only to the level of the foot where it was approximately 1 mm.  There did not appear to be much flow into the foot at all.  There is a peroneal artery that reconstitutes mid calf also gives out of the ankle.  There is no discernible posterior tibial artery. No intervention was performed.  Since her last follow up in July of 2020 she has not had any lower extremity symptoms. She no longer has pain or numbness in her left foot/ toes. She states she occasionally has some shooting pain in her right 2nd-4th toes but she says this is different than the pain she had in her left toes. Otherwise she denies any claudication symptoms or rest pain. She does not have any coldness or numbness. She does not have any non healing wounds.She explains that she walks a lot at work and she works 7 days a week and she does not have any discomfort in her legs on ambulation  She initially presented with occluded left lower extremity and originally underwent a thromboembolectomy 05/18/18 by Dr. Donzetta Matters. She has been on Xarelto since this occurrence. She continues to take Xarelto and Aspirin. She continues to tolerate these  Of note she just completed chemotherapy for cervical cancer. She had her with chest wall port removed last week. She reports she is on some oral medication for her treatment but she is unsure of the names of these medications and did  not bring a list with her to her appointment today  The pt not on a statin for cholesterol management.  The pt is on a daily aspirin.   Other AC: Xarelto The pt not on any medication for hypertension.   The pt is not diabetic.  Tobacco hx:  Former, quit 2017  Past Medical History:  Diagnosis Date  . Cancer ([place])    cervical  . DVT (deep vein thrombosis) in pregnancy   . Hypertension   . Obstructive thrombus 2019   left leg  . Sciatica    back    Past Surgical History:  Procedure Laterality Date  . ABDOMINAL AORTOGRAM N/A 05/18/2018   Procedure: ABDOMINAL AORTOGRAM;  Surgeon: Waynetta Sandy, MD;  Location: [place] [facility];  Service: Cardiovascular;  Laterality: N/A;  . ABDOMINAL AORTOGRAM W/LOWER EXTREMITY Left 10/26/2018   Procedure: ABDOMINAL AORTOGRAM W/LOWER EXTREMITY;  Surgeon: Waynetta Sandy, MD;  Location: [place] [facility];  Service: Cardiovascular;  Laterality: Left;  . IR IMAGING GUIDED PORT INSERTION  07/05/2019  . IR REMOVAL TUN ACCESS W/ PORT W/O FL MOD SED  12/25/2019  . LOWER EXTREMITY ANGIOGRAPHY Bilateral 05/18/2018   Procedure: LOWER EXTREMITY ANGIOGRAPHY;  Surgeon: Waynetta Sandy, MD;  Location: [place] [facility];  Service: Cardiovascular;  Laterality: Bilateral;  . OPERATIVE ULTRASOUND N/A 08/14/2019   Procedure: OPERATIVE ULTRASOUND;  Surgeon: Gery Pray, MD;  Location: [facility];  Service: Urology;  Laterality: N/A;  . OPERATIVE ULTRASOUND N/A 08/20/2019   Procedure: OPERATIVE ULTRASOUND;  Surgeon: Gery Pray, MD;  Location: [facility];  Service: Urology;  Laterality: N/A;  . OPERATIVE ULTRASOUND N/A 08/29/2019   Procedure: OPERATIVE ULTRASOUND;  Surgeon: Gery Pray, MD;  Location: [facility];  Service: Urology;  Laterality: N/A;  . OPERATIVE ULTRASOUND N/A 09/06/2019   Procedure: OPERATIVE ULTRASOUND;  Surgeon: Gery Pray, MD;  Location: [facility];   Service: Urology;  Laterality: N/A;  . OPERATIVE ULTRASOUND N/A 09/11/2019   Procedure: OPERATIVE ULTRASOUND;  Surgeon: Gery Pray, MD;  Location: [facility];  Service: Urology;  Laterality: N/A;  . TANDEM RING INSERTION N/A 08/14/2019   Procedure: TANDEM RING INSERTION - FIRST;  Surgeon: Gery Pray, MD;  Location: [facility];  Service: Urology;  Laterality: N/A;  . TANDEM RING INSERTION N/A 08/20/2019   Procedure: TANDEM RING INSERTION;  Surgeon: Gery Pray, MD;  Location: [facility];  Service: Urology;  Laterality: N/A;  . TANDEM RING INSERTION N/A 08/29/2019   Procedure: TANDEM RING INSERTION;  Surgeon: Gery Pray, MD;  Location: [facility];  Service: Urology;  Laterality: N/A;  . TANDEM RING INSERTION N/A 09/06/2019   Procedure: TANDEM RING INSERTION;  Surgeon: Gery Pray, MD;  Location: [facility];  Service: Urology;  Laterality: N/A;  . TANDEM RING INSERTION N/A 09/11/2019   Procedure: TANDEM RING INSERTION;  Surgeon: Gery Pray, MD;  Location: [facility];  Service: Urology;  Laterality: N/A;  . THROMBECTOMY FEMORAL ARTERY Left 05/18/2018   Procedure: LEFT LEG THROMBECTOMY, BALLOON ANGIOPLASTY LEFT POSTERIOR TIBIAL ARTERY;  Surgeon: Waynetta Sandy, MD;  Location: [place];  Service: Vascular;  Laterality: Left;  . TUBAL LIGATION      Social History   Socioeconomic History  . Marital status: Single    Spouse name: Not on file  . Number of children: Not on file  . Years of education: Not on file  . Highest education level: 12th grade  Occupational History  . Not on file  Tobacco Use  . Smoking status: Former Smoker    Packs/day: 1.00    Years: 15.00    Pack years: 15.00    Quit date: 07/02/2016    Years since quitting: 3.5  . Smokeless tobacco: Never Used  Substance and Sexual Activity  . Alcohol use: Yes    Comment: rare  . Drug use: Yes    Frequency: 7.0  times per week    Types: Marijuana    Comment: smokes blunt on occasion last blunt 07-12-2019  . Sexual activity: Yes    Birth control/protection: Surgical  Other Topics Concern  . Not on file  Social History Narrative  . Not on file   Social Determinants of Health   Financial Resource Strain:   . Difficulty of Paying Living Expenses:   Food Insecurity:   . Worried About Charity fundraiser in the Last Year:   . Arboriculturist in the Last Year:   Transportation Needs: No Transportation Needs  . Lack of Transportation (Medical): No  . Lack of Transportation (Non-Medical): No  Physical Activity:   . Days of Exercise per Week:   . Minutes of Exercise per Session:   Stress:   . Feeling of Stress :   Social Connections:   . Frequency of Communication with Friends and Family:   .  Frequency of Social Gatherings with Friends and Family:   . Attends Religious Services:   . Active Member of Clubs or Organizations:   . Attends Archivist Meetings:   Marland Kitchen Marital Status:   Intimate Partner Violence:   . Fear of Current or Ex-Partner:   . Emotionally Abused:   Marland Kitchen Physically Abused:   . Sexually Abused:     Family History  Problem Relation Age of Onset  . Cancer Mother        brain and uterine  . Stroke Brother   . Diabetes Brother   . Clotting disorder Brother   . Breast cancer Neg Hx     Current Outpatient Medications  Medication Sig Dispense Refill  . aspirin EC 81 MG EC tablet Take 1 tablet (81 mg total) by mouth daily.    . cyclobenzaprine (FLEXERIL) 10 MG tablet Take 1 tablet (10 mg total) by mouth 2 (two) times daily as needed for muscle spasms. 20 tablet 0  . gabapentin (NEURONTIN) 300 MG capsule Take 1 capsule (300 mg total) by mouth at bedtime. 30 capsule 3  . predniSONE (DELTASONE) 20 MG tablet Take 1 tablet (20 mg total) by mouth daily with breakfast. 7 tablet 0  . XARELTO 20 MG TABS tablet Take 1 tablet by mouth once daily 30 tablet 6   No current  facility-administered medications for this visit.    No Known Allergies   REVIEW OF SYSTEMS:  Review of Systems  Constitutional: Negative for chills, fever and malaise/fatigue.  Eyes: Negative for double vision.  Respiratory: Positive for cough. Negative for shortness of breath.   Cardiovascular: Positive for chest pain (states she recently had chest pain but was found to be related to her port). Negative for palpitations.  Gastrointestinal: Negative for abdominal pain, constipation, diarrhea, nausea and vomiting.  Genitourinary: Negative for dysuria.  Musculoskeletal: Positive for joint pain (knees). Negative for myalgias.  Neurological: Positive for sensory change (she does get tingling in her fingers which usually ocrrus upon first waking up in the morning). Negative for dizziness, weakness and headaches.  Endo/Heme/Allergies: Does not bruise/bleed easily.     PHYSICAL EXAMINATION:  Vitals:   01/31/20 1129  BP: 118/85  Pulse: 81  Resp: 18  Temp: (!) 97 F (36.1 C)  TempSrc: Temporal  SpO2: 100%  Weight: 232 lb (105.2 kg)  Height: 5\' 6"  (1.676 m)    General:  Overweight, well appearing, not in any discomfort; vital signs documented above Gait: Normal HENT: WNL, normocephalic Pulmonary: normal non-labored breathing , without Rales, rhonchi,  wheezing Cardiac: regular HR, without  Murmurs without carotid bruit Abdomen: soft, NT, no masses Skin: without rashes Vascular Exam/Pulses:  Right Left  Radial 2+ (normal) 2+ (normal)  Ulnar 2+ (normal) 2+ (normal)  Femoral 2+ (normal) 2+ (normal)  Popliteal 2+ (normal) 2+ (normal)  DP 2+ (normal) Not palpable  PT 1+ (weak) Not palpable   Extremities: without ischemic changes, without Gangrene , without cellulitis; without open wounds;  Musculoskeletal: no muscle wasting or atrophy  Neurologic: A&O X 3;  No focal weakness or paresthesias are detected Psychiatric:  The pt has Normal affect.   Non-Invasive Vascular  Imaging:   ABI's/TBI's independently reviewed. Bilateral lower extremities with triphasic waveforms. Essentially unchanged from prior study of the RLE and improved Toe pressures on the left in comparison to last study. Overall stable study compared to prior on 05/04/2019    ASSESSMENT/PLAN:: 49 y.o. female here for follow up for follow up of her  peripheral vascular disease. She is presently without any disabling symptoms. Her non invasive studies show improved toe pressures on the left and essentially otherwise unchanged bilaterally. She is not having any claudication, rest pain, or non healing wounds. She will continue her Aspirin and Xarelto. She is having some new right lower extremity symptoms. I have advised her to follow up earlier if she begins having pain at rest, claudication, or develops wounds. I have encouraged her to continue her regular exercise and continued smoking cessation. She will follow up in 3 months with repeat ABI    Karoline Caldwell, PA-C [facility] [phone]  Clinic MD:  Dr.Dickson

## 2020-02-04 ENCOUNTER — Other Ambulatory Visit: Payer: Self-pay | Admitting: *Deleted

## 2020-02-04 DIAGNOSIS — I998 Other disorder of circulatory system: Secondary | ICD-10-CM

## 2020-02-04 DIAGNOSIS — I779 Disorder of arteries and arterioles, unspecified: Secondary | ICD-10-CM

## 2020-02-05 ENCOUNTER — Other Ambulatory Visit: Payer: Self-pay | Admitting: Vascular Surgery

## 2020-03-17 ENCOUNTER — Encounter: Payer: Self-pay | Admitting: Radiation Oncology

## 2020-03-17 ENCOUNTER — Ambulatory Visit
Admission: RE | Admit: 2020-03-17 | Discharge: 2020-03-17 | Disposition: A | Discharge status: Home / Self Care | Payer: Self-pay | Source: Ambulatory Visit | Attending: Radiation Oncology | Admitting: Radiation Oncology

## 2020-03-17 ENCOUNTER — Other Ambulatory Visit: Payer: Self-pay

## 2020-03-17 DIAGNOSIS — Z8541 Personal history of malignant neoplasm of cervix uteri: Secondary | ICD-10-CM | POA: Insufficient documentation

## 2020-03-17 DIAGNOSIS — C538 Malignant neoplasm of overlapping sites of cervix uteri: Secondary | ICD-10-CM

## 2020-03-17 DIAGNOSIS — Z79899 Other long term (current) drug therapy: Secondary | ICD-10-CM | POA: Insufficient documentation

## 2020-03-17 DIAGNOSIS — R110 Nausea: Secondary | ICD-10-CM | POA: Insufficient documentation

## 2020-03-17 DIAGNOSIS — Z7901 Long term (current) use of anticoagulants: Secondary | ICD-10-CM | POA: Insufficient documentation

## 2020-03-17 DIAGNOSIS — Z7982 Long term (current) use of aspirin: Secondary | ICD-10-CM | POA: Insufficient documentation
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
## 2020-03-17 NOTE — Progress Notes (Signed)
Patient here for a 6 month f/u visit with Dr. Sondra Come. Pt. Reports pain and swelling in her right knee x 2 days. States pain is a "7." She denies any vaginal or rectal bleeding. She states her only sx is occasional nausea. Patient also denies pain.

## 2020-05-01 ENCOUNTER — Ambulatory Visit: Payer: Self-pay

## 2020-05-01 ENCOUNTER — Encounter (HOSPITAL_COMMUNITY): Payer: Self-pay

## 2020-05-21 ENCOUNTER — Other Ambulatory Visit: Payer: Self-pay | Admitting: Vascular Surgery

## 2020-06-04 ENCOUNTER — Ambulatory Visit: Payer: Self-pay

## 2020-06-04 ENCOUNTER — Inpatient Hospital Stay (HOSPITAL_COMMUNITY): Admission: RE | Admit: 2020-06-04 | Payer: Self-pay | Source: Ambulatory Visit

## 2020-06-12 ENCOUNTER — Inpatient Hospital Stay: Payer: Self-pay | Attending: Gynecologic Oncology | Admitting: Gynecologic Oncology

## 2020-06-12 ENCOUNTER — Encounter: Payer: Self-pay | Admitting: Gynecologic Oncology

## 2020-06-12 ENCOUNTER — Other Ambulatory Visit: Payer: Self-pay

## 2020-06-12 VITALS — BP 132/92 | HR 72 | Temp 96.5°F | Resp 20 | Ht 66.0 in | Wt 235.0 lb

## 2020-06-12 DIAGNOSIS — Z923 Personal history of irradiation: Secondary | ICD-10-CM | POA: Insufficient documentation

## 2020-06-12 DIAGNOSIS — Z7982 Long term (current) use of aspirin: Secondary | ICD-10-CM | POA: Insufficient documentation

## 2020-06-12 DIAGNOSIS — Z6836 Body mass index (BMI) 36.0-36.9, adult: Secondary | ICD-10-CM | POA: Insufficient documentation

## 2020-06-12 DIAGNOSIS — Z86718 Personal history of other venous thrombosis and embolism: Secondary | ICD-10-CM | POA: Insufficient documentation

## 2020-06-12 DIAGNOSIS — Z7901 Long term (current) use of anticoagulants: Secondary | ICD-10-CM | POA: Insufficient documentation

## 2020-06-12 DIAGNOSIS — Z833 Family history of diabetes mellitus: Secondary | ICD-10-CM | POA: Insufficient documentation

## 2020-06-12 DIAGNOSIS — Z8541 Personal history of malignant neoplasm of cervix uteri: Secondary | ICD-10-CM | POA: Insufficient documentation

## 2020-06-12 DIAGNOSIS — C538 Malignant neoplasm of overlapping sites of cervix uteri: Secondary | ICD-10-CM

## 2020-06-12 DIAGNOSIS — I10 Essential (primary) hypertension: Secondary | ICD-10-CM | POA: Insufficient documentation

## 2020-06-12 DIAGNOSIS — Z9221 Personal history of antineoplastic chemotherapy: Secondary | ICD-10-CM | POA: Insufficient documentation

## 2020-06-12 DIAGNOSIS — Z87891 Personal history of nicotine dependence: Secondary | ICD-10-CM | POA: Insufficient documentation

## 2020-06-12 DIAGNOSIS — E669 Obesity, unspecified: Secondary | ICD-10-CM | POA: Insufficient documentation

## 2020-06-12 DIAGNOSIS — Z7952 Long term (current) use of systemic steroids: Secondary | ICD-10-CM | POA: Insufficient documentation

## 2020-06-12 DIAGNOSIS — Z808 Family history of malignant neoplasm of other organs or systems: Secondary | ICD-10-CM | POA: Insufficient documentation

## 2020-06-12 DIAGNOSIS — Z79899 Other long term (current) drug therapy: Secondary | ICD-10-CM | POA: Insufficient documentation

## 2020-06-12 DIAGNOSIS — Z8049 Family history of malignant neoplasm of other genital organs: Secondary | ICD-10-CM | POA: Insufficient documentation

## 2020-06-12 NOTE — Patient Instructions (Signed)
Please notify Dr Denman George at phone number [phone] if you notice vaginal bleeding, new pelvic or abdominal pains, bloating, feeling full easy, or a change in bladder or bowel function.   Please have Dr Clabe Seal office contact Dr Serita Grit office (at [phone]) in December after your appointment with him to request an appointment with Dr Denman George for March, 2022.

## 2020-06-12 NOTE — Progress Notes (Signed)
Follow-up Note: Gyn-Onc  Consult was requested by Dr. Hulan Fray for the evaluation of Emma Medina 49 y.o. female  CC:  Chief Complaint  Patient presents with  . Cervical Cancer    Assessment/Plan:  Emma Medina  is a 49 y.o.  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   Complete clinical response.  Recommend 3 monthly follow-up until December, 2022. She will see Dr Sondra Come in 3 months and myself in 6 months.   Counseled regarding symptoms of recurrence.   Pap annually in August/September.   HPI: Emma Medina is a 48 year old P8 who was seen in consultation at the request of Dr. 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  The patient history began in August 2019 when she was diagnosed with a complex left lower extremity DVT that required femoral artery thrombectomy and placement of a stent in the left femoral artery.  She was placed on Xarelto and aspirin for following this procedure.  She had already been experiencing some irregular menses, however after commencing anticoagulant therapy her menstrual cycle became even heavier and more irregular with daily bleeding.  She cannot remember when she last had a Pap smear possibly in 2018 with her primary care provider [place].  She denies ever having an abnormal Pap smear.  She developed issues with her left lower extremity in the early part of 2020, and underwent a repeat abdominal aortogram with left lower extremity evaluation on October 26, 2018.  She had been seen in the emergency department in December 2019 for abnormal uterine bleeding where an ultrasound scan had been performed at that time that showed a uterus measuring 11.1 x 6.7 x 6.4 cm with subserosal fibroids and an endometrial thickness of 14 to 16 mm with no focal abnormality visualized.  The left and right ovaries  were grossly normal.  She followed up with Dr. Hulan Fray on April 26, 2019 due to persistent abnormal vaginal bleeding.  She was treated with Aygestin to control the bleeding.  An examination was performed no abnormalities on the cervix were noted in the examination findings from April 26, 2019.  An endometrial Pipelle biopsy was performed in the office at that time.  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  Is moderately differentiated.  The patient is obese with a BMI of 36 kg per metered squared.  She is an ex-smoker.  She lives with her son.  She has had 8 prior vaginal deliveries and a tubal ligation.  She works as a Librarian, academic.  Her only prior abdominal surgery was a tubal ligation.  A biopsy of a visible cervical lesion was performed on 06/01/19. This showed squamous cell carcinoma.   CT scan of the abdomen pelvis and chest was performed on June 05, 2019 and revealed no periaortic lymphadenopathy.  In the pelvis there was no gross lymphadenopathy, however several lymph nodes were visible less than 10 mm in short axis.  There is low attenuation within the endometrial canal of the uterine fundus.  The lower uterine segment and cervical regions did not appear to have extension beyond the however there was a rounded lesion on the anterior wall of the fundus measuring 2 cm corresponding to a fibroid.  Interval Hx:  An MRI of the pelvis was then performed which confirmed a 4.5 cm endocervical tumor without  gross parametrial extension.  This confirmed likely primary cervical site.  She completed definitive chemoradiation for stage IB3 SCC of the cervix. Radiation included 45Gy EBRT  (in 25 fractions) and HDR brachytherapy with tandem and ring for 5.5 Gy in 5 fractions. Dates of therapy were 07/09/19 through 09/10/20. She received concurrent weekly CDDP throughout (total of 5 cycles). She tolerated therapy well with no major delays or toxicities.   Post  treatment PET on 3/ 8/21 showed no residual abnormal hypermetabolism in the cervix. There was vaginal wall thickening and presacral edema felt to be treatment related.   Current Meds:  Outpatient Encounter Medications as of 06/12/2020  Medication Sig  . aspirin EC 81 MG EC tablet Take 1 tablet (81 mg total) by mouth daily.  . cyclobenzaprine (FLEXERIL) 10 MG tablet Take 1 tablet (10 mg total) by mouth 2 (two) times daily as needed for muscle spasms.  Marland Kitchen gabapentin (NEURONTIN) 300 MG capsule Take 1 capsule (300 mg total) by mouth at bedtime. (Patient taking differently: Take 300 mg by mouth at bedtime as needed. )  . XARELTO 20 MG TABS tablet Take 1 tablet by mouth once daily  . [DISCONTINUED] predniSONE (DELTASONE) 20 MG tablet Take 1 tablet (20 mg total) by mouth daily with breakfast. (Patient not taking: Reported on 03/17/2020)   No facility-administered encounter medications on file as of 06/12/2020.    Allergy: No Known Allergies  Social Hx:   Social History   Socioeconomic History  . Marital status: Single    Spouse name: Not on file  . Number of children: Not on file  . Years of education: Not on file  . Highest education level: 12th grade  Occupational History  . Not on file  Tobacco Use  . Smoking status: Former Smoker    Packs/day: 1.00    Years: 15.00    Pack years: 15.00    Quit date: 07/02/2016    Years since quitting: 3.9  . Smokeless tobacco: Never Used  Vaping Use  . Vaping Use: Never used  Substance and Sexual Activity  . Alcohol use: Yes    Comment: rare  . Drug use: Yes    Frequency: 7.0 times per week    Types: Marijuana    Comment: smokes blunt on occasion last blunt 07-12-2019  . Sexual activity: Yes    Birth control/protection: Surgical  Other Topics Concern  . Not on file  Social History Narrative  . Not on file   Social Determinants of Health   Financial Resource Strain:   . Difficulty of Paying Living Expenses: Not on file  Food Insecurity:   .  Worried About Charity fundraiser in the Last Year: Not on file  . Ran Out of Food in the Last Year: Not on file  Transportation Needs: No Transportation Needs  . Lack of Transportation (Medical): No  . Lack of Transportation (Non-Medical): No  Physical Activity:   . Days of Exercise per Week: Not on file  . Minutes of Exercise per Session: Not on file  Stress:   . Feeling of Stress : Not on file  Social Connections:   . Frequency of Communication with Friends and Family: Not on file  . Frequency of Social Gatherings with Friends and Family: Not on file  . Attends Religious Services: Not on file  . Active Member of Clubs or Organizations: Not on file  . Attends Archivist Meetings: Not on file  . Marital Status: Not on file  Intimate Partner Violence:   . Fear of Current or Ex-Partner: Not on file  . Emotionally Abused: Not on file  . Physically Abused: Not on file  . Sexually Abused: Not on file    Past Surgical Hx:  Past Surgical History:  Procedure Laterality Date  . ABDOMINAL AORTOGRAM N/A 05/18/2018   Procedure: ABDOMINAL AORTOGRAM;  Surgeon: Waynetta Sandy, MD;  Location: [place] [facility];  Service: Cardiovascular;  Laterality: N/A;  . ABDOMINAL AORTOGRAM W/LOWER EXTREMITY Left 10/26/2018   Procedure: ABDOMINAL AORTOGRAM W/LOWER EXTREMITY;  Surgeon: Waynetta Sandy, MD;  Location: [place] [facility];  Service: Cardiovascular;  Laterality: Left;  . IR IMAGING GUIDED PORT INSERTION  07/05/2019  . IR REMOVAL TUN ACCESS W/ PORT W/O FL MOD SED  12/25/2019  . LOWER EXTREMITY ANGIOGRAPHY Bilateral 05/18/2018   Procedure: LOWER EXTREMITY ANGIOGRAPHY;  Surgeon: Waynetta Sandy, MD;  Location: [place] [facility];  Service: Cardiovascular;  Laterality: Bilateral;  . OPERATIVE ULTRASOUND N/A 08/14/2019   Procedure: OPERATIVE ULTRASOUND;  Surgeon: Gery Pray, MD;  Location: [facility];  Service: Urology;  Laterality: N/A;  .  OPERATIVE ULTRASOUND N/A 08/20/2019   Procedure: OPERATIVE ULTRASOUND;  Surgeon: Gery Pray, MD;  Location: [facility];  Service: Urology;  Laterality: N/A;  . OPERATIVE ULTRASOUND N/A 08/29/2019   Procedure: OPERATIVE ULTRASOUND;  Surgeon: Gery Pray, MD;  Location: [facility];  Service: Urology;  Laterality: N/A;  . OPERATIVE ULTRASOUND N/A 09/06/2019   Procedure: OPERATIVE ULTRASOUND;  Surgeon: Gery Pray, MD;  Location: [facility];  Service: Urology;  Laterality: N/A;  . OPERATIVE ULTRASOUND N/A 09/11/2019   Procedure: OPERATIVE ULTRASOUND;  Surgeon: Gery Pray, MD;  Location: [facility];  Service: Urology;  Laterality: N/A;  . TANDEM RING INSERTION N/A 08/14/2019   Procedure: TANDEM RING INSERTION - FIRST;  Surgeon: Gery Pray, MD;  Location: [facility];  Service: Urology;  Laterality: N/A;  . TANDEM RING INSERTION N/A 08/20/2019   Procedure: TANDEM RING INSERTION;  Surgeon: Gery Pray, MD;  Location: [facility];  Service: Urology;  Laterality: N/A;  . TANDEM RING INSERTION N/A 08/29/2019   Procedure: TANDEM RING INSERTION;  Surgeon: Gery Pray, MD;  Location: [facility];  Service: Urology;  Laterality: N/A;  . TANDEM RING INSERTION N/A 09/06/2019   Procedure: TANDEM RING INSERTION;  Surgeon: Gery Pray, MD;  Location: [facility];  Service: Urology;  Laterality: N/A;  . TANDEM RING INSERTION N/A 09/11/2019   Procedure: TANDEM RING INSERTION;  Surgeon: Gery Pray, MD;  Location: [facility];  Service: Urology;  Laterality: N/A;  . THROMBECTOMY FEMORAL ARTERY Left 05/18/2018   Procedure: LEFT LEG THROMBECTOMY, BALLOON ANGIOPLASTY LEFT POSTERIOR TIBIAL ARTERY;  Surgeon: Waynetta Sandy, MD;  Location: [place];  Service: Vascular;  Laterality: Left;  . TUBAL LIGATION      Past Medical Hx:  Past Medical History:   Diagnosis Date  . Cancer ([place])    cervical  . DVT (deep vein thrombosis) in pregnancy   . Hypertension   . Obstructive thrombus 2019   left leg  . Sciatica    back    Past Gynecological History:  See HPI No LMP recorded. (Menstrual status: Other).  Family Hx:  Family History  Problem Relation Age of Onset  . Cancer Mother        brain and uterine  . Stroke Brother   . Diabetes Brother   .  Clotting disorder Brother   . Breast cancer Neg Hx     Review of Systems:  Constitutional  Feels well,   ENT Normal appearing ears and nares bilaterally Skin/Breast  No rash, sores, jaundice, itching, dryness Cardiovascular  No chest pain, shortness of breath, or edema  Pulmonary  No cough or wheeze.  Gastro Intestinal  No nausea, vomitting, or diarrhoea. No bright red blood per rectum, no abdominal pain, change in bowel movement, or constipation.  Genito Urinary  No frequency, urgency, dysuria, no bleeding.  Musculo Skeletal  No myalgia, arthralgia, joint swelling or pain  Neurologic  No weakness, numbness, change in gait,  Psychology  No depression, anxiety, insomnia.   Vitals:  Blood pressure (!) 132/92, pulse 72, temperature (!) 96.5 F (35.8 C), temperature source Tympanic, resp. rate 20, height 5\' 6"  (1.676 m), weight 235 lb (106.6 kg), SpO2 100 %.  Physical Exam: WD in NAD Neck  Supple NROM, without any enlargements.  Lymph Node Survey No cervical supraclavicular or inguinal adenopathy Cardiovascular  Pulse normal rate, regularity and rhythm. S1 and S2 normal.  Lungs  Clear to auscultation bilateraly, without wheezes/crackles/rhonchi. Good air movement.  Skin  No rash/lesions/breakdown  Psychiatry  Alert and oriented to person, place, and time  Abdomen  Normoactive bowel sounds, abdomen soft, non-tender and obese without evidence of hernia.  Back No CVA tenderness Genito Urinary  Vulva/vagina: Normal external female genitalia.  No lesions. No discharge or  bleeding.  Bladder/urethra:  No lesions or masses, well supported bladder  Vagina: grossly normal, posterior lip of cervix mass encroaches on posterior vaginal fornix.  Cervix: no visible or palpable tumor - cervix posteriorly agglutinated with backwall of vagina.   Uterus:  Slightly bulky, mobile, no parametrial involvement or nodularity.  Adnexa: no discrete masses. Rectal  Good tone, no masses no cul de sac nodularity.  Extremities  No bilateral cyanosis, clubbing or edema.   Thereasa Solo, MD  06/12/2020, 3:00 PM

## 2020-08-01 ENCOUNTER — Other Ambulatory Visit: Payer: Self-pay | Admitting: Physician Assistant

## 2020-08-01 DIAGNOSIS — I779 Disorder of arteries and arterioles, unspecified: Secondary | ICD-10-CM

## 2020-08-01 DIAGNOSIS — I998 Other disorder of circulatory system: Secondary | ICD-10-CM

## 2020-09-09 ENCOUNTER — Other Ambulatory Visit: Payer: Self-pay

## 2020-09-09 DIAGNOSIS — I779 Disorder of arteries and arterioles, unspecified: Secondary | ICD-10-CM

## 2020-09-18 ENCOUNTER — Ambulatory Visit
Admission: RE | Admit: 2020-09-18 | Discharge: 2020-09-18 | Disposition: A | Discharge status: Home / Self Care | Payer: Self-pay | Source: Ambulatory Visit | Attending: Radiation Oncology | Admitting: Radiation Oncology

## 2020-09-18 ENCOUNTER — Ambulatory Visit (HOSPITAL_COMMUNITY)
Admission: RE | Admit: 2020-09-18 | Discharge: 2020-09-18 | Disposition: A | Discharge status: Home / Self Care | Payer: Self-pay | Source: Ambulatory Visit | Attending: Physician Assistant | Admitting: Physician Assistant

## 2020-09-18 ENCOUNTER — Other Ambulatory Visit: Payer: Self-pay

## 2020-09-18 ENCOUNTER — Ambulatory Visit (INDEPENDENT_AMBULATORY_CARE_PROVIDER_SITE_OTHER): Payer: Self-pay | Admitting: Physician Assistant

## 2020-09-18 ENCOUNTER — Encounter: Payer: Self-pay | Admitting: Radiation Oncology

## 2020-09-18 VITALS — BP 114/82 | HR 86 | Temp 97.4°F | Resp 20 | Ht 66.0 in | Wt 230.2 lb

## 2020-09-18 DIAGNOSIS — Z79899 Other long term (current) drug therapy: Secondary | ICD-10-CM | POA: Insufficient documentation

## 2020-09-18 DIAGNOSIS — Z7982 Long term (current) use of aspirin: Secondary | ICD-10-CM | POA: Insufficient documentation

## 2020-09-18 DIAGNOSIS — Z7901 Long term (current) use of anticoagulants: Secondary | ICD-10-CM | POA: Insufficient documentation

## 2020-09-18 DIAGNOSIS — Z8541 Personal history of malignant neoplasm of cervix uteri: Secondary | ICD-10-CM | POA: Insufficient documentation

## 2020-09-18 DIAGNOSIS — I779 Disorder of arteries and arterioles, unspecified: Secondary | ICD-10-CM

## 2020-09-18 DIAGNOSIS — C538 Malignant neoplasm of overlapping sites of cervix uteri: Secondary | ICD-10-CM

## 2020-09-18 NOTE — Progress Notes (Signed)
Patient here for a f/u visit with Dr. Sondra Come. She denies pain, vaginal bleeding or other sx. Reports some discomfort with IC a few weeks ago on 2 occasions.  BP 104/65   Pulse 96   Temp (!) 97.2 F (36.2 C) (Temporal)   Resp 20   Ht 5\' 6"  (1.676 m)   Wt 230 lb 6.4 oz (104.5 kg)   SpO2 100%   BMI 37.19 kg/m   Wt Readings from Last 3 Encounters:  09/18/20 230 lb 6.4 oz (104.5 kg)  06/12/20 235 lb (106.6 kg)  03/17/20 233 lb (105.7 kg)

## 2020-09-18 NOTE — Progress Notes (Signed)
Radiation Oncology         [phone] ________________________________  Name: Emma Medina MRN: [id number]  Date: 09/18/2020  DOB: 18-May-1971  Follow-Up Visit Note  CC: Placey, Audrea Muscat, NP  Placey, Audrea Muscat, NP    ICD-10-CM   1. Malignant neoplasm of overlapping sites of cervix 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 She is doing well overall. She was last seen by Dr. Denman George on 06/12/2020, during which time she was noted to have had a complete clinical response.   On review of systems, she reports feeling well. She denies vaginal bleeding pelvic pain or abdominal bloating.  She denies any hematuria or rectal bleeding.  Patient is not using her vaginal dilator but she is sexually active.  She denies any pain with intercourse or postcoital bleeding.    ALLERGIES:  has No Known Allergies.  Meds: Current Outpatient Medications  Medication Sig Dispense Refill   aspirin EC 81 MG EC tablet Take 1 tablet (81 mg total) by mouth daily.     cyclobenzaprine (FLEXERIL) 10 MG tablet Take 1 tablet (10 mg total) by mouth 2 (two) times daily as needed for muscle spasms. 20 tablet 0   lisinopril (ZESTRIL) 10 MG tablet Take 10 mg by mouth daily.     XARELTO 20 MG TABS tablet Take 1 tablet by mouth once daily 90 tablet 3   No current facility-administered medications for this encounter.    Physical Findings: The patient is in no acute distress. Patient is alert and oriented.  height is 5\' 6"  (1.676 m) and weight is 230 lb 6.4 oz (104.5 kg). Her temporal temperature is 97.2 F (36.2 C)  (abnormal). Her blood pressure is 104/65 and her pulse is 96. Her respiration is 20 and oxygen saturation is 100%.  Lungs are clear to auscultation bilaterally. Heart has regular rate and rhythm. No palpable cervical, supraclavicular, or axillary adenopathy. Abdomen soft, non-tender, normal bowel sounds. On pelvic examination the external genitalia were unremarkable. A speculum exam was performed. There are no mucosal lesions noted in the vaginal vault.  View of the cervix reveals no visible or palpable lesions.  The cervix is agglutinated towards the back wall of the vagina and to the left vaginal wall.  On bimanual and rectovaginal examination there were no pelvic masses appreciated.  Rectal tone good.  Lab Findings: Lab Results  Component Value Date   WBC 4.6 12/25/2019   HGB 13.1 12/25/2019   HCT 40.9 12/25/2019   MCV 86.3 12/25/2019   PLT 288 12/25/2019    Radiographic Findings: VAS [place] ABI WITH/WO TBI  Result Date: 09/18/2020 LOWER EXTREMITY DOPPLER STUDY Indications: Peripheral artery disease. High Risk Factors: Hypertension, past history of smoking.  Vascular Interventions: Left common iliac thrombectomy, and posterior tibial                         artery angioplasty on 05/18/2018. Performing Technologist: Delorise Shiner RVT  Examination Guidelines: A complete evaluation includes at minimum, Doppler waveform signals and systolic blood pressure reading at the level of bilateral  brachial, anterior tibial, and posterior tibial arteries, when vessel segments are accessible. Bilateral testing is considered an integral part of a complete examination. Photoelectric Plethysmograph (PPG) waveforms and toe systolic pressure readings are included as required and additional duplex testing as needed. Limited examinations for reoccurring indications may be performed as noted.  ABI Findings: +---------+------------------+-----+---------+--------+  Right     Rt Pressure (mmHg) Index Waveform  Comment    +---------+------------------+-----+---------+--------+  Brachial  112                                          +---------+------------------+-----+---------+--------+  ATA       133                1.12                      +---------+------------------+-----+---------+--------+  PTA       139                1.17  triphasic           +---------+------------------+-----+---------+--------+  DP                                 triphasic           +---------+------------------+-----+---------+--------+  Great Toe 98                 0.82                      +---------+------------------+-----+---------+--------+ +---------+------------------+-----+-------------------+-------+  Left      Lt Pressure (mmHg) Index Waveform            Comment  +---------+------------------+-----+-------------------+-------+  Brachial  119                                                   +---------+------------------+-----+-------------------+-------+  ATA       120                1.01                               +---------+------------------+-----+-------------------+-------+  PTA       75                 0.63  dampened monophasic          +---------+------------------+-----+-------------------+-------+  PERO      118                0.99  triphasic                    +---------+------------------+-----+-------------------+-------+  DP                                 triphasic                    +---------+------------------+-----+-------------------+-------+  Great Toe 0                  0.00                               +---------+------------------+-----+-------------------+-------+ +-------+-----------+-----------+------------+------------+  ABI/TBI Today's ABI Today's TBI Previous ABI Previous TBI  +-------+-----------+-----------+------------+------------+  Right   1.17        0.82        1.17         0.94          +-------+-----------+-----------+------------+------------+  Left    1.01        0.00        1.13         0.35           +-------+-----------+-----------+------------+------------+ Bilateral ABIs appear essentially unchanged compared to prior study on 01/31/2020.  Summary: Right: Resting right ankle-brachial index is within normal range. No evidence of significant right lower extremity arterial disease. The right toe-brachial index is normal. RT great toe pressure = 98 mmHg. Left: Resting left ankle-brachial index is within normal range. No evidence of significant left lower extremity arterial disease. The left toe-brachial index is abnormal. LT Great toe pressure = 0 mmHg.  *See table(s) above for measurements and observations.  Electronically signed by Deitra Mayo MD on 09/18/2020 at 3:35:30 PM.    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  Plan: The patient will follow up with Dr. Denman George in three months and with radiation oncology in six months.  She will be due for Pap smear in August or September of next year.  Total time spent in this encounter was 25 minutes which included reviewing the patient's most recent follow-up with Dr. Denman George, physical examination, and documentation. ____________________________________   Blair Promise, PhD, MD  This document serves as a record of services personally performed by Gery Pray, MD. It was created on his behalf by Clerance Lav, a trained medical scribe. The creation of this record is based on the scribe's personal observations and the provider's statements to them. This document has been checked and approved by the attending provider.

## 2020-09-18 NOTE — Progress Notes (Signed)
Office Note     CC:  follow up Requesting Provider:  Marliss Coots, NP  HPI: Emma Medina is a 49 y.o. (1971/07/30) female who presents routine follow-up peripheral vascular disease.  The patient was last seen in April of this year.  She was without complaints or symptoms of claudication.  Her ABIs were normal with triphasic waveforms.  Her left toe pressure was 41 and her right great toe pressure was 111.  Her initial presentation in 2019 was an occluded left lower extremity and she underwent thromboembolectomy on May 18, 2018.  She was placed on Xarelto and has maintained this.  Her last arteriogram was October 26, 2018 for complaints of pain and numbness in her left foot and a toe brachial index of 0.  No intervention was performed.  Today, she complains of bilateral knee pain but no cramping muscular pain when walking.  She denies rest pain.  Denies numbness or tingling in her feet.  The pt not on a statin for cholesterol management.  The pt is on a daily aspirin.   Other AC: Xarelto The pt not on any medication for hypertension.   The pt is not diabetic.  Tobacco hx:  Former, quit 2017  Past Medical History:  Diagnosis Date  . Cancer ([place])    cervical  . DVT (deep vein thrombosis) in pregnancy   . Hypertension   . Obstructive thrombus 2019   left leg  . Sciatica    back    Past Surgical History:  Procedure Laterality Date  . ABDOMINAL AORTOGRAM N/A 05/18/2018   Procedure: ABDOMINAL AORTOGRAM;  Surgeon: Waynetta Sandy, MD;  Location: [place] [facility];  Service: Cardiovascular;  Laterality: N/A;  . ABDOMINAL AORTOGRAM W/LOWER EXTREMITY Left 10/26/2018   Procedure: ABDOMINAL AORTOGRAM W/LOWER EXTREMITY;  Surgeon: Waynetta Sandy, MD;  Location: [facility];  Service: Cardiovascular;  Laterality: Left;  . IR IMAGING GUIDED PORT INSERTION  07/05/2019  . IR REMOVAL TUN ACCESS W/ PORT W/O FL MOD SED  12/25/2019  . LOWER EXTREMITY ANGIOGRAPHY  Bilateral 05/18/2018   Procedure: LOWER EXTREMITY ANGIOGRAPHY;  Surgeon: Waynetta Sandy, MD;  Location: [place] [facility];  Service: Cardiovascular;  Laterality: Bilateral;  . OPERATIVE ULTRASOUND N/A 08/14/2019   Procedure: OPERATIVE ULTRASOUND;  Surgeon: Gery Pray, MD;  Location: [facility];  Service: Urology;  Laterality: N/A;  . OPERATIVE ULTRASOUND N/A 08/20/2019   Procedure: OPERATIVE ULTRASOUND;  Surgeon: Gery Pray, MD;  Location: [facility];  Service: Urology;  Laterality: N/A;  . OPERATIVE ULTRASOUND N/A 08/29/2019   Procedure: OPERATIVE ULTRASOUND;  Surgeon: Gery Pray, MD;  Location: [facility];  Service: Urology;  Laterality: N/A;  . OPERATIVE ULTRASOUND N/A 09/06/2019   Procedure: OPERATIVE ULTRASOUND;  Surgeon: Gery Pray, MD;  Location: [facility];  Service: Urology;  Laterality: N/A;  . OPERATIVE ULTRASOUND N/A 09/11/2019   Procedure: OPERATIVE ULTRASOUND;  Surgeon: Gery Pray, MD;  Location: [facility];  Service: Urology;  Laterality: N/A;  . TANDEM RING INSERTION N/A 08/14/2019   Procedure: TANDEM RING INSERTION - FIRST;  Surgeon: Gery Pray, MD;  Location: [facility];  Service: Urology;  Laterality: N/A;  . TANDEM RING INSERTION N/A 08/20/2019   Procedure: TANDEM RING INSERTION;  Surgeon: Gery Pray, MD;  Location: [facility];  Service: Urology;  Laterality: N/A;  . TANDEM RING INSERTION N/A 08/29/2019   Procedure: TANDEM RING INSERTION;  Surgeon: Sondra Come,  Jeneen Rinks, MD;  Location: [facility];  Service: Urology;  Laterality: N/A;  . TANDEM RING INSERTION N/A 09/06/2019   Procedure: TANDEM RING INSERTION;  Surgeon: Gery Pray, MD;  Location: [facility];  Service: Urology;  Laterality: N/A;  . TANDEM RING INSERTION N/A 09/11/2019   Procedure: TANDEM RING INSERTION;  Surgeon: Gery Pray, MD;  Location:  [facility];  Service: Urology;  Laterality: N/A;  . THROMBECTOMY FEMORAL ARTERY Left 05/18/2018   Procedure: LEFT LEG THROMBECTOMY, BALLOON ANGIOPLASTY LEFT POSTERIOR TIBIAL ARTERY;  Surgeon: Waynetta Sandy, MD;  Location: [place];  Service: Vascular;  Laterality: Left;  . TUBAL LIGATION      Social History   Socioeconomic History  . Marital status: Single    Spouse name: Not on file  . Number of children: Not on file  . Years of education: Not on file  . Highest education level: 12th grade  Occupational History  . Not on file  Tobacco Use  . Smoking status: Former Smoker    Packs/day: 1.00    Years: 15.00    Pack years: 15.00    Quit date: 07/02/2016    Years since quitting: 4.2  . Smokeless tobacco: Never Used  Vaping Use  . Vaping Use: Never used  Substance and Sexual Activity  . Alcohol use: Yes    Comment: rare  . Drug use: Yes    Frequency: 7.0 times per week    Types: Marijuana    Comment: smokes blunt on occasion last blunt 07-12-2019  . Sexual activity: Yes    Birth control/protection: Surgical  Other Topics Concern  . Not on file  Social History Narrative  . Not on file   Social Determinants of Health   Financial Resource Strain: Not on file  Food Insecurity: Not on file  Transportation Needs: No Transportation Needs  . Lack of Transportation (Medical): No  . Lack of Transportation (Non-Medical): No  Physical Activity: Not on file  Stress: Not on file  Social Connections: Not on file  Intimate Partner Violence: Not on file   Family History  Problem Relation Age of Onset  . Cancer Mother        brain and uterine  . Stroke Brother   . Diabetes Brother   . Clotting disorder Brother   . Breast cancer Neg Hx     Current Outpatient Medications  Medication Sig Dispense Refill  . aspirin EC 81 MG EC tablet Take 1 tablet (81 mg total) by mouth daily.    . cyclobenzaprine (FLEXERIL) 10 MG tablet Take 1 tablet (10 mg total) by  mouth 2 (two) times daily as needed for muscle spasms. 20 tablet 0  . lisinopril (ZESTRIL) 10 MG tablet Take 10 mg by mouth daily.    Alveda Reasons 20 MG TABS tablet Take 1 tablet by mouth once daily 90 tablet 3   No current facility-administered medications for this visit.    No Known Allergies   REVIEW OF SYSTEMS:   [X]  denotes positive finding, [ ]  denotes negative finding Cardiac  Comments:  Chest pain or chest pressure:    Shortness of breath upon exertion:    Short of breath when lying flat:    Irregular heart rhythm:        Vascular    Pain in calf, thigh, or hip brought on by ambulation: x  see HPI  Pain in feet at night that wakes you up from your sleep:  Blood clot in your veins:    Leg swelling:         Pulmonary    Oxygen at home:    Productive cough:     Wheezing:         Neurologic    Sudden weakness in arms or legs:     Sudden numbness in arms or legs:     Sudden onset of difficulty speaking or slurred speech:    Temporary loss of vision in one eye:     Problems with dizziness:         Gastrointestinal    Blood in stool:     Vomited blood:         Genitourinary    Burning when urinating:     Blood in urine:        Psychiatric    Major depression:         Hematologic    Bleeding problems:    Problems with blood clotting too easily:        Skin    Rashes or ulcers:        Constitutional    Fever or chills:      PHYSICAL EXAMINATION:  Vitals:   09/18/20 1530  BP: 114/82  Pulse: 86  Resp: 20  Temp: (!) 97.4 F (36.3 C)  SpO2: 99%   General:  WDWN in NAD; vital signs documented above Gait: unaided, no ataxia HENT: WNL, normocephalic Pulmonary: normal non-labored breathing , without Rales, rhonchi,  wheezing Cardiac: regular HR, without  Murmurs without carotid bruits Abdomen: soft, NT, no masses Skin: without rashes Vascular Exam/Pulses: 2+ brachial ,radial and right DP pulses. Bilateral femoral pulses are weakly  palpable Extremities: without ischemic changes, without Gangrene , without cellulitis; without open wounds; both feet are warm with intact motor function and sensation Musculoskeletal: no muscle wasting or atrophy  Neurologic: A&O X 3;  No focal weakness or paresthesias are detected Psychiatric:  The pt has Normal affect.   Non-Invasive Vascular Imaging:   09/18/2020 ABI/TBIToday's ABIToday's TBIPrevious ABIPrevious TBI  +-------+-----------+-----------+------------+------------+  Right 1.17    0.82    1.17    0.94      +-------+-----------+-----------+------------+------------+  Left  1.01    0.00    1.13    0.35    Waveforms are triphasic Left TP = 0 Rigth TP = 98  ASSESSMENT/PLAN:: 49 y.o. female here for follow up for peripheral vascular disease with history of thrombosed left lower extremity in 2019.  Her last arteriogram in 2020 revealed 30% stenosis of the SFA likely due to her previous thrombectomy site. Runoff on the left is via ATA to the level of the foot where the caliber of the artery is 1 mm. Her peroneal artery reconstitutes at mid calf and ends at the ankle.  Unable to obtain left toe pressure today. ABIs are within normal limits. She has no symptoms referable to PVD.  We discussed continuing her medications as prescribed and to continue regular exercise.  I also advised her to observe feet for any skin changes or poor healing wounds and to call for evaluation should this occur.  Follow-up in 6 months with ABIs.  Barbie Banner, PA-C [facility] [phone]  Clinic MD:   Scot Dock

## 2020-12-02 ENCOUNTER — Telehealth: Payer: Self-pay | Admitting: *Deleted

## 2020-12-02 NOTE — Telephone Encounter (Signed)
CALLED PATIENT TO INFORM OF FU APPT. WITH DR. Denman George ON 01-05-21- ARRIVAL TIME- 1:30 PM, SPOKE WITH PATIENT AND SHE IS AWARE OF THIS APPT.

## 2021-01-01 ENCOUNTER — Encounter: Payer: Self-pay | Admitting: Gynecologic Oncology

## 2021-01-05 ENCOUNTER — Inpatient Hospital Stay: Payer: Self-pay | Attending: Gynecologic Oncology | Admitting: Gynecologic Oncology

## 2021-01-05 DIAGNOSIS — C538 Malignant neoplasm of overlapping sites of cervix uteri: Secondary | ICD-10-CM

## 2021-01-05 NOTE — Progress Notes (Unsigned)
Follow-up Note: Gyn-Onc  Consult was requested by Dr. Hulan Fray for the evaluation of Emma Medina 50 y.o. female  CC:  Chief Complaint  Patient presents with  . Malignant neoplasm of overlapping sites of cervix [facility])    Assessment/Plan:  Emma Medina  is a 50 y.o.  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   Complete clinical response.  Recommend 3 monthly follow-up until December, 2022. She will see Dr Sondra Come in 3 months and myself in 6 months.   Counseled regarding symptoms of recurrence.   Pap annually in August/September.   HPI: Emma Medina is a 50 year old P8 who was seen in consultation at the request of Dr. 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  The patient history began in August 2019 when she was diagnosed with a complex left lower extremity DVT that required femoral artery thrombectomy and placement of a stent in the left femoral artery.  She was placed on Xarelto and aspirin for following this procedure.  She had already been experiencing some irregular menses, however after commencing anticoagulant therapy her menstrual cycle became even heavier and more irregular with daily bleeding.  She cannot remember when she last had a Pap smear possibly in 2018 with her primary care provider [place].  She denies ever having an abnormal Pap smear.  She developed issues with her left lower extremity in the early part of 2020, and underwent a repeat abdominal aortogram with left lower extremity evaluation on October 26, 2018.  She had been seen in the emergency department in December 2019 for abnormal uterine bleeding where an ultrasound scan had been performed at that time that showed a uterus measuring 11.1 x 6.7 x 6.4 cm with subserosal fibroids and an endometrial thickness of 14 to 16 mm with no focal abnormality  visualized.  The left and right ovaries were grossly normal.  She followed up with Dr. Hulan Fray on April 26, 2019 due to persistent abnormal vaginal bleeding.  She was treated with Aygestin to control the bleeding.  An examination was performed no abnormalities on the cervix were noted in the examination findings from April 26, 2019.  An endometrial Pipelle biopsy was performed in the office at that time.  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  Is moderately differentiated.  A biopsy of a visible cervical lesion was performed on 06/01/19. This showed squamous cell carcinoma.   CT scan of the abdomen pelvis and chest was performed on June 05, 2019 and revealed no periaortic lymphadenopathy.  In the pelvis there was no gross lymphadenopathy, however several lymph nodes were visible less than 10 mm in short axis.  There is low attenuation within the endometrial canal of the uterine fundus.  The lower uterine segment and cervical regions did not appear to have extension beyond the however there was a rounded lesion on the anterior wall of the fundus measuring 2 cm corresponding to a fibroid.  Interval Hx:  An MRI of the pelvis was then performed which confirmed a 4.5 cm endocervical tumor without gross parametrial extension.  This confirmed likely primary cervical site.  She completed definitive chemoradiation for stage IB3 SCC of the cervix. Radiation included 45Gy EBRT  (in 25 fractions) and HDR brachytherapy with tandem and ring for 5.5 Gy in 5 fractions. Dates of therapy were 07/09/19 through 09/10/20.  She received concurrent weekly CDDP throughout (total of 5 cycles). She tolerated therapy well with no major delays or toxicities.   Post treatment PET on 3/ 8/21 showed no residual abnormal hypermetabolism in the cervix. There was vaginal wall thickening and presacral edema felt to be treatment related.   Current Meds:  Outpatient Encounter Medications as of  01/05/2021  Medication Sig  . aspirin EC 81 MG EC tablet Take 1 tablet (81 mg total) by mouth daily.  . cyclobenzaprine (FLEXERIL) 10 MG tablet Take 1 tablet (10 mg total) by mouth 2 (two) times daily as needed for muscle spasms.  Marland Kitchen lisinopril (ZESTRIL) 10 MG tablet Take 10 mg by mouth daily.  Alveda Reasons 20 MG TABS tablet Take 1 tablet by mouth once daily   No facility-administered encounter medications on file as of 01/05/2021.    Allergy: No Known Allergies  Social Hx:   Social History   Socioeconomic History  . Marital status: Single    Spouse name: Not on file  . Number of children: Not on file  . Years of education: Not on file  . Highest education level: 12th grade  Occupational History  . Not on file  Tobacco Use  . Smoking status: Former Smoker    Packs/day: 1.00    Years: 15.00    Pack years: 15.00    Quit date: 07/02/2016    Years since quitting: 4.5  . Smokeless tobacco: Never Used  Vaping Use  . Vaping Use: Never used  Substance and Sexual Activity  . Alcohol use: Yes    Comment: rare  . Drug use: Yes    Frequency: 7.0 times per week    Types: Marijuana    Comment: smokes blunt on occasion last blunt 07-12-2019  . Sexual activity: Yes    Birth control/protection: Surgical  Other Topics Concern  . Not on file  Social History Narrative  . Not on file   Social Determinants of Health   Financial Resource Strain: Not on file  Food Insecurity: Not on file  Transportation Needs: Not on file  Physical Activity: Not on file  Stress: Not on file  Social Connections: Not on file  Intimate Partner Violence: Not on file    Past Surgical Hx:  Past Surgical History:  Procedure Laterality Date  . ABDOMINAL AORTOGRAM N/A 05/18/2018   Procedure: ABDOMINAL AORTOGRAM;  Surgeon: Waynetta Sandy, MD;  Location: [place] [facility];  Service: Cardiovascular;  Laterality: N/A;  . ABDOMINAL AORTOGRAM W/LOWER EXTREMITY Left 10/26/2018   Procedure: ABDOMINAL AORTOGRAM  W/LOWER EXTREMITY;  Surgeon: Waynetta Sandy, MD;  Location: [place] [facility];  Service: Cardiovascular;  Laterality: Left;  . IR IMAGING GUIDED PORT INSERTION  07/05/2019  . IR REMOVAL TUN ACCESS W/ PORT W/O FL MOD SED  12/25/2019  . LOWER EXTREMITY ANGIOGRAPHY Bilateral 05/18/2018   Procedure: LOWER EXTREMITY ANGIOGRAPHY;  Surgeon: Waynetta Sandy, MD;  Location: [place] [facility];  Service: Cardiovascular;  Laterality: Bilateral;  . OPERATIVE ULTRASOUND N/A 08/14/2019   Procedure: OPERATIVE ULTRASOUND;  Surgeon: Gery Pray, MD;  Location: [facility];  Service: Urology;  Laterality: N/A;  . OPERATIVE ULTRASOUND N/A 08/20/2019   Procedure: OPERATIVE ULTRASOUND;  Surgeon: Gery Pray, MD;  Location: [facility];  Service: Urology;  Laterality: N/A;  . OPERATIVE ULTRASOUND N/A 08/29/2019   Procedure: OPERATIVE ULTRASOUND;  Surgeon: Gery Pray, MD;  Location: [facility];  Service: Urology;  Laterality: N/A;  . OPERATIVE ULTRASOUND  N/A 09/06/2019   Procedure: OPERATIVE ULTRASOUND;  Surgeon: Gery Pray, MD;  Location: [facility];  Service: Urology;  Laterality: N/A;  . OPERATIVE ULTRASOUND N/A 09/11/2019   Procedure: OPERATIVE ULTRASOUND;  Surgeon: Gery Pray, MD;  Location: [facility];  Service: Urology;  Laterality: N/A;  . TANDEM RING INSERTION N/A 08/14/2019   Procedure: TANDEM RING INSERTION - FIRST;  Surgeon: Gery Pray, MD;  Location: [facility];  Service: Urology;  Laterality: N/A;  . TANDEM RING INSERTION N/A 08/20/2019   Procedure: TANDEM RING INSERTION;  Surgeon: Gery Pray, MD;  Location: [facility];  Service: Urology;  Laterality: N/A;  . TANDEM RING INSERTION N/A 08/29/2019   Procedure: TANDEM RING INSERTION;  Surgeon: Gery Pray, MD;  Location: [facility];  Service: Urology;  Laterality: N/A;  . TANDEM RING INSERTION  N/A 09/06/2019   Procedure: TANDEM RING INSERTION;  Surgeon: Gery Pray, MD;  Location: [facility];  Service: Urology;  Laterality: N/A;  . TANDEM RING INSERTION N/A 09/11/2019   Procedure: TANDEM RING INSERTION;  Surgeon: Gery Pray, MD;  Location: [facility];  Service: Urology;  Laterality: N/A;  . THROMBECTOMY FEMORAL ARTERY Left 05/18/2018   Procedure: LEFT LEG THROMBECTOMY, BALLOON ANGIOPLASTY LEFT POSTERIOR TIBIAL ARTERY;  Surgeon: Waynetta Sandy, MD;  Location: [place];  Service: Vascular;  Laterality: Left;  . TUBAL LIGATION      Past Medical Hx:  Past Medical History:  Diagnosis Date  . Cancer ([place])    cervical  . DVT (deep vein thrombosis) in pregnancy   . Hypertension   . Obstructive thrombus 2019   left leg  . Sciatica    back    Past Gynecological History:  See HPI No LMP recorded. (Menstrual status: Other).  Family Hx:  Family History  Problem Relation Age of Onset  . Cancer Mother        brain and uterine  . Stroke Brother   . Diabetes Brother   . Clotting disorder Brother   . Breast cancer Neg Hx     Review of Systems:  Constitutional  Feels well,   ENT Normal appearing ears and nares bilaterally Skin/Breast  No rash, sores, jaundice, itching, dryness Cardiovascular  No chest pain, shortness of breath, or edema  Pulmonary  No cough or wheeze.  Gastro Intestinal  No nausea, vomitting, or diarrhoea. No bright red blood per rectum, no abdominal pain, change in bowel movement, or constipation.  Genito Urinary  No frequency, urgency, dysuria, no bleeding.  Musculo Skeletal  No myalgia, arthralgia, joint swelling or pain  Neurologic  No weakness, numbness, change in gait,  Psychology  No depression, anxiety, insomnia.   Vitals:  There were no vitals taken for this visit.  Physical Exam: WD in NAD Neck  Supple NROM, without any enlargements.  Lymph Node Survey No cervical supraclavicular or inguinal  adenopathy Cardiovascular  Well perfused peripheries.  Lungs  No increased WOB Skin  No rash/lesions/breakdown  Psychiatry  Alert and oriented to person, place, and time  Abdomen  Normoactive bowel sounds, abdomen soft, non-tender and obese without evidence of hernia.  Back No CVA tenderness Genito Urinary  Vulva/vagina: Normal external female genitalia.  No lesions. No discharge or bleeding.  Bladder/urethra:  No lesions or masses, well supported bladder  Vagina: grossly normal, posterior lip of cervix mass encroaches on posterior vaginal fornix.  Cervix: no visible or palpable tumor - cervix posteriorly agglutinated with backwall  of vagina.   Uterus:  Slightly bulky, mobile, no parametrial involvement or nodularity.  Adnexa: no discrete masses. Rectal  Good tone, no masses no cul de sac nodularity.  Extremities  No bilateral cyanosis, clubbing or edema.   Thereasa Solo, MD  01/05/2021, 12:29 PM

## 2021-01-07 ENCOUNTER — Telehealth: Payer: Self-pay | Admitting: *Deleted

## 2021-01-07 NOTE — Telephone Encounter (Signed)
Returned the patient's call and rescheduled her missed appt from 4/4 to 5/5

## 2021-02-03 ENCOUNTER — Encounter: Payer: Self-pay | Admitting: Gynecologic Oncology

## 2021-02-03 NOTE — Progress Notes (Signed)
Follow-up Note: Gyn-Onc  Consult was requested by Dr. Hulan Fray for the evaluation of Emma Medina 50 y.o. female  CC:  Chief Complaint  Patient presents with  . history of cervical cancer    Assessment/Plan:  [name]  is a 50 y.o.  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   Complete clinical response.  New right anterior cervicovaginal ulceration. Await results of biopsy. If recurrence, would consider for exent vs salvage chemotherapy. PET ordered to evaluate for distant metastases.  If benign, recommend continued 3 monthly follow-up until December, 2022. She will see Dr Sondra Come in 3 months and myself in 6 months.   Pap annually in August/September.   HPI: [name] is a 50 year old P8 who was seen in consultation at the request of Dr. 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  The patient history began in August 2019 when she was diagnosed with a complex left lower extremity DVT that required femoral artery thrombectomy and placement of a stent in the left femoral artery.  She was placed on Xarelto and aspirin for following this procedure.  She had already been experiencing some irregular menses, however after commencing anticoagulant therapy her menstrual cycle became even heavier and more irregular with daily bleeding.  She cannot remember when she last had a Pap smear possibly in 2018 with her primary care provider [place].  She denies ever having an abnormal Pap smear.  She developed issues with her left lower extremity in the early part of 2020, and underwent a repeat abdominal aortogram with left lower extremity evaluation on October 26, 2018.  She had been seen in the emergency department in December 2019 for abnormal uterine bleeding where an ultrasound scan had been performed at that time that showed a  uterus measuring 11.1 x 6.7 x 6.4 cm with subserosal fibroids and an endometrial thickness of 14 to 16 mm with no focal abnormality visualized.  The left and right ovaries were grossly normal.  She followed up with Dr. Hulan Fray on April 26, 2019 due to persistent abnormal vaginal bleeding.  She was treated with Aygestin to control the bleeding.  An examination was performed no abnormalities on the cervix were noted in the examination findings from April 26, 2019.  An endometrial Pipelle biopsy was performed in the office at that time.  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  Is moderately differentiated.  A biopsy of a visible cervical lesion was performed on 06/01/19. This showed squamous cell carcinoma.   CT scan of the abdomen pelvis and chest was performed on June 05, 2019 and revealed no periaortic lymphadenopathy.  In the pelvis there was no gross lymphadenopathy, however several lymph nodes were visible less than 10 mm in short axis.  There is low attenuation within the endometrial canal of the uterine fundus.  The lower uterine segment and cervical regions did not appear to have extension beyond the however there was a rounded lesion on the anterior wall of the fundus measuring 2 cm corresponding to a fibroid.  An MRI of the pelvis was then performed which confirmed a 4.5 cm endocervical tumor without gross parametrial extension.  This confirmed likely primary cervical site.  She completed definitive chemoradiation for stage IB3 SCC of the cervix. Radiation included 45Gy EBRT  (in 25 fractions) and HDR brachytherapy with tandem  and ring for 5.5 Gy in 5 fractions. Dates of therapy were 07/09/19 through 09/10/20. She received concurrent weekly CDDP throughout (total of 5 cycles). She tolerated therapy well with no major delays or toxicities.   Post treatment PET on 3/ 8/21 showed no residual abnormal hypermetabolism in the cervix. There was vaginal wall  thickening and presacral edema felt to be treatment related.   Interval Hx:  On 02/01/21 she experienced a new onset of vaginal spotting.   Current Meds:  Outpatient Encounter Medications as of 02/05/2021  Medication Sig  . aspirin EC 81 MG EC tablet Take 1 tablet (81 mg total) by mouth daily.  . cyclobenzaprine (FLEXERIL) 10 MG tablet Take 1 tablet (10 mg total) by mouth 2 (two) times daily as needed for muscle spasms.  Marland Kitchen lisinopril (ZESTRIL) 10 MG tablet Take 10 mg by mouth daily.  Alveda Reasons 20 MG TABS tablet Take 1 tablet by mouth once daily  . [DISCONTINUED] traZODone (DESYREL) 50 MG tablet Take 50 mg by mouth at bedtime as needed.   No facility-administered encounter medications on file as of 02/05/2021.    Allergy: No Known Allergies  Social Hx:   Social History   Socioeconomic History  . Marital status: Single    Spouse name: Not on file  . Number of children: Not on file  . Years of education: Not on file  . Highest education level: 12th grade  Occupational History  . Not on file  Tobacco Use  . Smoking status: Former Smoker    Packs/day: 1.00    Years: 15.00    Pack years: 15.00    Quit date: 07/02/2016    Years since quitting: 4.6  . Smokeless tobacco: Never Used  Vaping Use  . Vaping Use: Never used  Substance and Sexual Activity  . Alcohol use: Yes    Comment: rare  . Drug use: Yes    Frequency: 7.0 times per week    Types: Marijuana    Comment: smokes blunt on occasion last blunt 07-12-2019  . Sexual activity: Yes    Birth control/protection: Surgical  Other Topics Concern  . Not on file  Social History Narrative  . Not on file   Social Determinants of Health   Financial Resource Strain: Not on file  Food Insecurity: Not on file  Transportation Needs: Not on file  Physical Activity: Not on file  Stress: Not on file  Social Connections: Not on file  Intimate Partner Violence: Not on file    Past Surgical Hx:  Past Surgical History:  Procedure  Laterality Date  . ABDOMINAL AORTOGRAM N/A 05/18/2018   Procedure: ABDOMINAL AORTOGRAM;  Surgeon: Waynetta Sandy, MD;  Location: [facility];  Service: Cardiovascular;  Laterality: N/A;  . ABDOMINAL AORTOGRAM W/LOWER EXTREMITY Left 10/26/2018   Procedure: ABDOMINAL AORTOGRAM W/LOWER EXTREMITY;  Surgeon: Waynetta Sandy, MD;  Location: [facility];  Service: Cardiovascular;  Laterality: Left;  . IR IMAGING GUIDED PORT INSERTION  07/05/2019  . IR REMOVAL TUN ACCESS W/ PORT W/O FL MOD SED  12/25/2019  . LOWER EXTREMITY ANGIOGRAPHY Bilateral 05/18/2018   Procedure: LOWER EXTREMITY ANGIOGRAPHY;  Surgeon: Waynetta Sandy, MD;  Location: [facility];  Service: Cardiovascular;  Laterality: Bilateral;  . OPERATIVE ULTRASOUND N/A 08/14/2019   Procedure: OPERATIVE ULTRASOUND;  Surgeon: Gery Pray, MD;  Location: [facility];  Service: Urology;  Laterality: N/A;  . OPERATIVE ULTRASOUND N/A 08/20/2019   Procedure: OPERATIVE ULTRASOUND;  Surgeon:  Gery Pray, MD;  Location: [facility];  Service: Urology;  Laterality: N/A;  . OPERATIVE ULTRASOUND N/A 08/29/2019   Procedure: OPERATIVE ULTRASOUND;  Surgeon: Gery Pray, MD;  Location: [facility];  Service: Urology;  Laterality: N/A;  . OPERATIVE ULTRASOUND N/A 09/06/2019   Procedure: OPERATIVE ULTRASOUND;  Surgeon: Gery Pray, MD;  Location: [facility];  Service: Urology;  Laterality: N/A;  . OPERATIVE ULTRASOUND N/A 09/11/2019   Procedure: OPERATIVE ULTRASOUND;  Surgeon: Gery Pray, MD;  Location: [facility];  Service: Urology;  Laterality: N/A;  . TANDEM RING INSERTION N/A 08/14/2019   Procedure: TANDEM RING INSERTION - FIRST;  Surgeon: Gery Pray, MD;  Location[facility];  Service: Urology;  Laterality: N/A;  . TANDEM RING INSERTION N/A 08/20/2019   Procedure: TANDEM RING INSERTION;  Surgeon: Gery Pray, MD;  Location: [facility];  Service: Urology;  Laterality: N/A;  . TANDEM RING INSERTION N/A 08/29/2019   Procedure: TANDEM RING INSERTION;  Surgeon: Gery Pray, MD;  Location: [facility];  Service: Urology;  Laterality: N/A;  . TANDEM RING INSERTION N/A 09/06/2019   Procedure: TANDEM RING INSERTION;  Surgeon: Gery Pray, MD;  Location: [facility];  Service: Urology;  Laterality: N/A;  . TANDEM RING INSERTION N/A 09/11/2019   Procedure: TANDEM RING INSERTION;  Surgeon: Gery Pray, MD;  Location: [facility];  Service: Urology;  Laterality: N/A;  . THROMBECTOMY FEMORAL ARTERY Left 05/18/2018   Procedure: LEFT LEG THROMBECTOMY, BALLOON ANGIOPLASTY LEFT POSTERIOR TIBIAL ARTERY;  Surgeon: Waynetta Sandy, MD;  Location: [place];  Service: Vascular;  Laterality: Left;  . TUBAL LIGATION      Past Medical Hx:  Past Medical History:  Diagnosis Date  . Cancer ([place])    cervical  . DVT (deep vein thrombosis) in pregnancy   . Hypertension   . Obstructive thrombus 2019   left leg  . Sciatica    back    Past Gynecological History:  See HPI No LMP recorded. (Menstrual status: Other).  Family Hx:  Family History  Problem Relation Age of Onset  . Cancer Mother        brain and uterine  . Stroke Brother   . Diabetes Brother   . Clotting disorder Brother   . Breast cancer Neg Hx     Review of Systems:  Constitutional  Feels well,   ENT Normal appearing ears and nares bilaterally Skin/Breast  No rash, sores, jaundice, itching, dryness Cardiovascular  No chest pain, shortness of breath, or edema  Pulmonary  No cough or wheeze.  Gastro Intestinal  No nausea, vomitting, or diarrhoea. No bright red blood per rectum, no abdominal pain, change in bowel movement, or constipation.  Genito Urinary  No frequency, urgency, dysuria, no bleeding.  Musculo Skeletal  No myalgia, arthralgia, joint swelling or pain   Neurologic  No weakness, numbness, change in gait,  Psychology  No depression, anxiety, insomnia.   Vitals:  Pulse 75, temperature 98.7 F (37.1 C), resp. rate 18, height 5\' 6"  (1.676 m), weight 217 lb (98.4 kg), SpO2 100 %.  Physical Exam: WD in NAD Neck  Supple NROM, without any enlargements.  Lymph Node Survey No cervical supraclavicular or inguinal adenopathy Cardiovascular  Well perfused peripheries.  Lungs  No increased WOB Skin  No rash/lesions/breakdown  Psychiatry  Alert and oriented to person, place, and time  Abdomen  Normoactive bowel sounds, abdomen soft, non-tender and  obese without evidence of hernia.  Back No CVA tenderness Genito Urinary  Vulva/vagina: Normal external female genitalia.  No lesions. No discharge or bleeding.  Bladder/urethra:  No lesions or masses, well supported bladder  Vagina: grossly normal, posterior lip of cervix mass encroaches on posterior vaginal fornix.  Cervix: cervix posteriorly agglutinated with backwall of vagina. There is a 2x2cm area of ulceration with necrotic tissue at the base on the anterior right cervicovaginal junction at 10 o'clock. It is palpably soft without associated induration or nodularity.   Uterus:  Slightly bulky, mobile, no parametrial involvement or nodularity.  Adnexa: no discrete masses. Rectal  Good tone, no masses no cul de sac nodularity.  Extremities  No bilateral cyanosis, clubbing or edema.  Procedure Note:  Preop Dx: history of cervical cancer, cervical mass Postop Dx: same Procedure: cervical biopsy Surgeon: Dorann Ou, MD EBL: scant Specimens: cervix Complications: none Procedure Details: the patient provided verbal consent and verbal timeout was performed.  The speculum was inserted into the vagina and the ulcerated lesion at the anterior cervicovaginal junction was visualized.  A biopsy forcep was used to take a representative sample from the edge and base of the ulcerated lesion.  This was  sent for permanent pathology.  Silver nitrate was used for hemostasis at the base of the lesion.  The patient tolerated the procedure well.  The speculum was removed.   Thereasa Solo, MD  02/05/2021, 2:58 PM

## 2021-02-05 ENCOUNTER — Inpatient Hospital Stay: Payer: No Typology Code available for payment source | Attending: Gynecologic Oncology | Admitting: Gynecologic Oncology

## 2021-02-05 ENCOUNTER — Other Ambulatory Visit: Payer: Self-pay

## 2021-02-05 ENCOUNTER — Encounter: Payer: Self-pay | Admitting: Gynecologic Oncology

## 2021-02-05 VITALS — HR 75 | Temp 98.7°F | Resp 18 | Ht 66.0 in | Wt 217.0 lb

## 2021-02-05 DIAGNOSIS — Z9221 Personal history of antineoplastic chemotherapy: Secondary | ICD-10-CM | POA: Insufficient documentation

## 2021-02-05 DIAGNOSIS — Z8541 Personal history of malignant neoplasm of cervix uteri: Secondary | ICD-10-CM

## 2021-02-05 DIAGNOSIS — Z86718 Personal history of other venous thrombosis and embolism: Secondary | ICD-10-CM | POA: Diagnosis not present

## 2021-02-05 DIAGNOSIS — Z923 Personal history of irradiation: Secondary | ICD-10-CM | POA: Diagnosis not present

## 2021-02-05 DIAGNOSIS — Z08 Encounter for follow-up examination after completed treatment for malignant neoplasm: Secondary | ICD-10-CM

## 2021-02-05 DIAGNOSIS — Z7901 Long term (current) use of anticoagulants: Secondary | ICD-10-CM | POA: Diagnosis not present

## 2021-02-05 DIAGNOSIS — Z7982 Long term (current) use of aspirin: Secondary | ICD-10-CM | POA: Insufficient documentation

## 2021-02-05 DIAGNOSIS — C538 Malignant neoplasm of overlapping sites of cervix uteri: Secondary | ICD-10-CM

## 2021-02-05 DIAGNOSIS — Z79899 Other long term (current) drug therapy: Secondary | ICD-10-CM | POA: Insufficient documentation

## 2021-02-05 DIAGNOSIS — Z87891 Personal history of nicotine dependence: Secondary | ICD-10-CM | POA: Insufficient documentation

## 2021-02-05 NOTE — Patient Instructions (Signed)
Dr Denman George performed a biopsy today.  Her office will call you with the results.  She has ordered a PET scan.  Please have Dr Clabe Seal office contact Dr Serita Grit office (at [phone]) in August after your appointment with him to request an appointment with Dr Denman George for November, 2022.

## 2021-02-09 LAB — SURGICAL PATHOLOGY

## 2021-02-17 ENCOUNTER — Ambulatory Visit (HOSPITAL_COMMUNITY)
Admission: RE | Admit: 2021-02-17 | Discharge: 2021-02-17 | Disposition: A | Discharge status: Home / Self Care | Payer: No Typology Code available for payment source | Source: Ambulatory Visit | Attending: Gynecologic Oncology | Admitting: Gynecologic Oncology

## 2021-02-17 ENCOUNTER — Other Ambulatory Visit: Payer: Self-pay

## 2021-02-17 DIAGNOSIS — C538 Malignant neoplasm of overlapping sites of cervix uteri: Secondary | ICD-10-CM | POA: Diagnosis present

## 2021-02-17 LAB — GLUCOSE, CAPILLARY: Glucose-Capillary: 87 mg/dL (ref 70–99)

## 2021-02-17 MED ORDER — FLUDEOXYGLUCOSE F - 18 (FDG) INJECTION
10.9000 | Freq: Once | INTRAVENOUS | Status: AC
  Administered 2021-02-17: 11.6 via INTRAVENOUS

## 2021-02-20 ENCOUNTER — Telehealth: Payer: Self-pay

## 2021-02-20 NOTE — Telephone Encounter (Signed)
Told Ms Dettmann that the biopsy showed radiation changes. No pre-cancer or cancer seen per Melissa Cross,NP. The PET Scan shows no evidence of recurrence. It does show some gallstones that are are not obstructing. A umbilical hernia containing a loop of small bowel with out and complicating feature. Reviewed symptoms such as abdominal pain ,difficulty with BM.  This could indicate that the bowel is being strangulated in the umbilicus. 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 Pt verbalized understanding.

## 2021-03-19 ENCOUNTER — Ambulatory Visit: Payer: Self-pay | Admitting: Radiation Oncology

## 2021-04-14 ENCOUNTER — Encounter: Payer: Self-pay | Admitting: Radiology

## 2021-04-15 NOTE — Progress Notes (Signed)
Radiation Oncology         [phone] ________________________________  Name: Emma Medina MRN: [id number]  Date: 04/16/2021  DOB: October 15, 1970  Follow-Up Visit Note  CC: Placey, Audrea Muscat, NP  Placey, Audrea Muscat, NP    ICD-10-CM   1. 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    Since she was last seen, the patient experienced a new onset of vaginal spotting on 02/01/21.  The patient then underwent a biopsy of the right anterior cervix on 02/05/21 under Dr. Denman George , pathology revealed: reactive squamous mucosa with ulcer debris, and focal stromal atypia consistent with radiation treatment. Overall, findings showed no significant dysplasia or malignancy.          On 02/17/21, the patient received restaging PET imaging which showed no evidence of recurrent hypermetabolic activity along the cervix or lower uterine segment and no hypermetabolic adenopathy.       She denies any further vaginal bleeding since that one episode.  She denies any chills or fever.  She has urinary frequency at night but no dysuria or hematuria.  She denies any bowel complaints.  No dyspareunia.           Allergies:  has No Known Allergies.  Meds: Current Outpatient Medications  Medication Sig Dispense Refill   aspirin EC 81 MG EC tablet Take 1 tablet (81 mg total) by mouth daily.     cyclobenzaprine (FLEXERIL) 10 MG tablet Take 1 tablet (10 mg total) by mouth 2 (two) times daily as needed for muscle spasms. 20 tablet 0   XARELTO 20 MG TABS tablet Take 1 tablet  by mouth once daily 90 tablet 3   lisinopril (ZESTRIL) 10 MG tablet Take 10 mg by mouth daily. (Patient not taking: Reported on 04/16/2021)     No current facility-administered medications for this encounter.    Physical Findings: The patient is in no acute distress. Patient is alert and oriented.  weight is 223 lb 3.2 oz (101.2 kg). Her temperature is 97 F (36.1 C) (abnormal). Her blood pressure is 124/76 and her pulse is 69. Her respiration is 20 and oxygen saturation is 100%. .  No significant changes. Lungs are clear to auscultation bilaterally. Heart has regular rate and rhythm. No palpable cervical, supraclavicular, or axillary adenopathy. Abdomen soft, non-tender, normal bowel sounds.  On pelvic examination the external genitalia are unremarkable.  A speculum exam is performed.  Patient has some yellowish vaginal drainage present in the upper vaginal area.  She Continues to have an area of ulceration/necrotic tissue along the region of the cervix.  Exam was somewhat uncomfortable for the patient and I was unable to identify the cervical os.  On bimanual and rectovaginal examination no masses palpable.  Rectal tone good.   Lab Findings: Lab Results  Component Value Date   WBC 4.6 12/25/2019   HGB 13.1 12/25/2019   HCT 40.9 12/25/2019   MCV 86.3 12/25/2019   PLT 288 12/25/2019    Radiographic Findings: No results found.  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  She was seen by Dr. Denman George and an area of ulceration was noted.  This was biopsied and returned negative for malignancy.  PET scan also showed no evidence of recurrence.  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  Hopefully with time this will heal.  Plan: Patient will follow-up with gynecologic oncology in 3 months.  Radiation oncology in 6 months.  Patient will call if she experiences significant vaginal  bleeding.   20 minutes of total time was spent for this patient encounter, including preparation, face-to-face counseling with the patient and coordination of care, physical exam, and documentation of the encounter. ____________________________________  Blair Promise, PhD, MD   This document serves as a record of services personally performed by Gery Pray, MD. It was created on his behalf by Roney Mans, a trained medical scribe. The creation of this record is based on the scribe's personal observations and the provider's statements to them. This document has been checked and approved by the attending provider.

## 2021-04-16 ENCOUNTER — Other Ambulatory Visit: Payer: Self-pay

## 2021-04-16 ENCOUNTER — Encounter: Payer: Self-pay | Admitting: Radiation Oncology

## 2021-04-16 ENCOUNTER — Ambulatory Visit
Admission: RE | Admit: 2021-04-16 | Discharge: 2021-04-16 | Disposition: A | Discharge status: Home / Self Care | Payer: No Typology Code available for payment source | Source: Ambulatory Visit | Attending: Radiation Oncology | Admitting: Radiation Oncology

## 2021-04-16 VITALS — BP 124/76 | HR 69 | Temp 97.0°F | Resp 20 | Wt 223.2 lb

## 2021-04-16 DIAGNOSIS — C538 Malignant neoplasm of overlapping sites of cervix uteri: Secondary | ICD-10-CM

## 2021-04-16 DIAGNOSIS — Z7901 Long term (current) use of anticoagulants: Secondary | ICD-10-CM | POA: Insufficient documentation

## 2021-04-16 DIAGNOSIS — Z923 Personal history of irradiation: Secondary | ICD-10-CM | POA: Diagnosis not present

## 2021-04-16 DIAGNOSIS — Z7982 Long term (current) use of aspirin: Secondary | ICD-10-CM | POA: Diagnosis not present

## 2021-04-16 DIAGNOSIS — Z8541 Personal history of malignant neoplasm of cervix uteri: Secondary | ICD-10-CM | POA: Diagnosis present

## 2021-04-16 NOTE — Progress Notes (Signed)
Emma Medina is here today for follow up post radiation to the pelvic.  They completed their radiation on: 09/11/19   Does the patient complain of any of the following:  Pain:Patient denies pain Abdominal bloating: no Diarrhea/Constipation:Diarrhea  Nausea/Vomiting: nausea.  Vaginal Discharge: no Blood in Urine or Stool: no Urinary Issues (dysuria/incomplete emptying/ incontinence/ increased frequency/urgency): Patient reports getting up every 2-3 hours to urinate Does patient report using vaginal dilator 2-3 times a week and/or sexually active 2-3 weeks: patient sexually active. Post radiation skin changes: no   Additional comments if applicable:   Vitals:   04/16/21 1615  BP: 124/76  Pulse: 69  Resp: 20  Temp: (!) 97 F (36.1 C)  SpO2: 100%  Weight: 223 lb 3.2 oz (101.2 kg)

## 2021-04-17 ENCOUNTER — Telehealth: Payer: Self-pay | Admitting: *Deleted

## 2021-04-17 NOTE — Telephone Encounter (Signed)
CALLED PATIENT TO ASK ABOUT COMING TO SEE DR. KINARD ON 10-19-21 @ 3 PM FOR FU, SPOKE WITH PATIENT AND SHE AGREED TO THIS DATE AND TIME

## 2021-04-21 ENCOUNTER — Telehealth: Payer: Self-pay | Admitting: *Deleted

## 2021-04-21 NOTE — Telephone Encounter (Signed)
CALLED PATIENT TO INFORM OF FU WITH DR. Denman George ON 07-01-21- ARRIVAL TIME- 1:30 PM AND FU WITH DR. Lumpkin ON 10-19-21 @ 3PM, SPOKE WITH PATIENT AND SHE IS AWARE OF THESE APPTS.

## 2021-04-28 ENCOUNTER — Other Ambulatory Visit: Payer: Self-pay | Admitting: Physician Assistant

## 2021-04-28 ENCOUNTER — Telehealth: Payer: Self-pay

## 2021-04-28 ENCOUNTER — Other Ambulatory Visit: Payer: Self-pay | Admitting: Vascular Surgery

## 2021-04-28 DIAGNOSIS — I998 Other disorder of circulatory system: Secondary | ICD-10-CM

## 2021-04-28 DIAGNOSIS — I779 Disorder of arteries and arterioles, unspecified: Secondary | ICD-10-CM

## 2021-04-28 MED ORDER — RIVAROXABAN 20 MG PO TABS: 20.0000 mg | ORAL_TABLET | Freq: Every day | ORAL | 11 refills | Status: DC

## 2021-04-28 NOTE — Telephone Encounter (Signed)
Pt called for a refill of Xarelto.  She said her grandkids lost a bottle that was supposed to last her until the end of August.  I advised her that we do not carry samples and maybe she could call and explain her situation to the Pharmacist. We called her in more prescriptions.  Patient verbalized understanding.

## 2021-06-12 ENCOUNTER — Telehealth: Payer: Self-pay | Admitting: *Deleted

## 2021-06-12 NOTE — Telephone Encounter (Signed)
Called and moved the patient's appt from 9/28 to 9/12 

## 2021-06-14 NOTE — Progress Notes (Unsigned)
Follow-up Note: Gyn-Onc  Consult was requested by Dr. Hulan Fray for the evaluation of Emma Medina 50 y.o. female  CC:  No chief complaint on file.   Assessment/Plan:  [name]  is a 50 y.o.  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   Complete clinical response.  If she develops recurrence, would consider for exent vs salvage chemotherapy.  If benign, recommend continued 3 monthly follow-up until December, 2022. She will see Dr Sondra Come in 3 months and my partner, Dr Berline Lopes, in 6 months.   Pap annually in August/September.   HPI: [name] is a 50 year old P8 who was seen in consultation at the request of Dr. 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  The patient history began in August 2019 when she was diagnosed with a complex left lower extremity DVT that required femoral artery thrombectomy and placement of a stent in the left femoral artery.  She was placed on Xarelto and aspirin for following this procedure.  She had already been experiencing some irregular menses, however after commencing anticoagulant therapy her menstrual cycle became even heavier and more irregular with daily bleeding.  She cannot remember when she last had a Pap smear possibly in 2018 with her primary care provider [place].  She denies ever having an abnormal Pap smear.  She developed issues with her left lower extremity in the early part of 2020, and underwent a repeat abdominal aortogram with left lower extremity evaluation on October 26, 2018.  She had been seen in the emergency department in December 2019 for abnormal uterine bleeding where an ultrasound scan had been performed at that time that showed a uterus measuring 11.1 x 6.7 x 6.4 cm with subserosal fibroids and an endometrial thickness of 14 to 16 mm with no focal abnormality  visualized.  The left and right ovaries were grossly normal.  She followed up with Dr. Hulan Fray on April 26, 2019 due to persistent abnormal vaginal bleeding.  She was treated with Aygestin to control the bleeding.  An examination was performed no abnormalities on the cervix were noted in the examination findings from April 26, 2019.  An endometrial Pipelle biopsy was performed in the office at that time.  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  Is moderately differentiated.  A biopsy of a visible cervical lesion was performed on 06/01/19. This showed squamous cell carcinoma.   CT scan of the abdomen pelvis and chest was performed on June 05, 2019 and revealed no periaortic lymphadenopathy.  In the pelvis there was no gross lymphadenopathy, however several lymph nodes were visible less than 10 mm in short axis.  There is low attenuation within the endometrial canal of the uterine fundus.  The lower uterine segment and cervical regions did not appear to have extension beyond the however there was a rounded lesion on the anterior wall of the fundus measuring 2 cm corresponding to a fibroid.  An MRI of the pelvis was then performed which confirmed a 4.5 cm endocervical tumor without gross parametrial extension.  This confirmed likely primary cervical site.  She completed definitive chemoradiation for stage IB3 SCC of the cervix. Radiation included 45Gy EBRT  (in 25 fractions) and HDR brachytherapy with tandem and ring for 5.5 Gy in 5 fractions. Dates of therapy were 07/09/19 through 09/10/20. She received concurrent weekly  CDDP throughout (total of 5 cycles). She tolerated therapy well with no major delays or toxicities.   Post treatment PET on 3/ 8/21 showed no residual abnormal hypermetabolism in the cervix. There was vaginal wall thickening and presacral edema felt to be treatment related.   Interval Hx:  On 02/01/21 she experienced a new onset of vaginal  spotting. When she was seen on *** there was a necrotic appearing area noted at the anterior right cervicovaginal junction which was biopsied and showed ***.   ***  Current Meds:  Outpatient Encounter Medications as of 06/15/2021  Medication Sig   aspirin EC 81 MG EC tablet Take 1 tablet (81 mg total) by mouth daily.   cyclobenzaprine (FLEXERIL) 10 MG tablet Take 1 tablet (10 mg total) by mouth 2 (two) times daily as needed for muscle spasms.   lisinopril (ZESTRIL) 10 MG tablet Take 10 mg by mouth daily. (Patient not taking: Reported on 04/16/2021)   rivaroxaban (XARELTO) 20 MG TABS tablet Take 1 tablet (20 mg total) by mouth daily with supper.   XARELTO 20 MG TABS tablet Take 1 tablet by mouth once daily   No facility-administered encounter medications on file as of 06/15/2021.    Allergy: No Known Allergies  Social Hx:   Social History   Socioeconomic History   Marital status: Single    Spouse name: Not on file   Number of children: Not on file   Years of education: Not on file   Highest education level: 12th grade  Occupational History   Not on file  Tobacco Use   Smoking status: Former    Packs/day: 1.00    Years: 15.00    Pack years: 15.00    Types: Cigarettes    Quit date: 07/02/2016    Years since quitting: 4.9   Smokeless tobacco: Never  Vaping Use   Vaping Use: Never used  Substance and Sexual Activity   Alcohol use: Yes    Comment: rare   Drug use: Yes    Frequency: 7.0 times per week    Types: Marijuana    Comment: smokes blunt on occasion last blunt 07-12-2019   Sexual activity: Yes    Birth control/protection: Surgical  Other Topics Concern   Not on file  Social History Narrative   Not on file   Social Determinants of Health   Financial Resource Strain: Not on file  Food Insecurity: Not on file  Transportation Needs: Not on file  Physical Activity: Not on file  Stress: Not on file  Social Connections: Not on file  Intimate Partner Violence: Not on  file    Past Surgical Hx:  Past Surgical History:  Procedure Laterality Date   ABDOMINAL AORTOGRAM N/A 05/18/2018   Procedure: ABDOMINAL AORTOGRAM;  Surgeon: Waynetta Sandy, MD;  Location: [place] [facility];  Service: Cardiovascular;  Laterality: N/A;   ABDOMINAL AORTOGRAM W/LOWER EXTREMITY Left 10/26/2018   Procedure: ABDOMINAL AORTOGRAM W/LOWER EXTREMITY;  Surgeon: Waynetta Sandy, MD;  Location: [place] [facility];  Service: Cardiovascular;  Laterality: Left;   IR IMAGING GUIDED PORT INSERTION  07/05/2019   IR REMOVAL TUN ACCESS W/ PORT W/O FL MOD SED  12/25/2019   LOWER EXTREMITY ANGIOGRAPHY Bilateral 05/18/2018   Procedure: LOWER EXTREMITY ANGIOGRAPHY;  Surgeon: Waynetta Sandy, MD;  Location: [place] [facility];  Service: Cardiovascular;  Laterality: Bilateral;   OPERATIVE ULTRASOUND N/A 08/14/2019   Procedure: OPERATIVE ULTRASOUND;  Surgeon: Gery Pray, MD;  Location: [place];  Service: Urology;  Laterality: N/A;   OPERATIVE ULTRASOUND N/A 08/20/2019   Procedure: OPERATIVE ULTRASOUND;  Surgeon: Gery Pray, MD;  Location: [facility];  Service: Urology;  Laterality: N/A;   OPERATIVE ULTRASOUND N/A 08/29/2019   Procedure: OPERATIVE ULTRASOUND;  Surgeon: Gery Pray, MD;  Location: [facility];  Service: Urology;  Laterality: N/A;   OPERATIVE ULTRASOUND N/A 09/06/2019   Procedure: OPERATIVE ULTRASOUND;  Surgeon: Gery Pray, MD;  Location[facility];  Service: Urology;  Laterality: N/A;   OPERATIVE ULTRASOUND N/A 09/11/2019   Procedure: OPERATIVE ULTRASOUND;  Surgeon: Gery Pray, MD;  Location: [facility];  Service: Urology;  Laterality: N/A;   TANDEM RING INSERTION N/A 08/14/2019   Procedure: TANDEM RING INSERTION - FIRST;  Surgeon: Gery Pray, MD;  Location: [facility];  Service: Urology;  Laterality: N/A;   TANDEM RING INSERTION N/A 08/20/2019    Procedure: TANDEM RING INSERTION;  Surgeon: Gery Pray, MD;  Location: [facility];  Service: Urology;  Laterality: N/A;   TANDEM RING INSERTION N/A 08/29/2019   Procedure: TANDEM RING INSERTION;  Surgeon: Gery Pray, MD;  Location: [facility];  Service: Urology;  Laterality: N/A;   TANDEM RING INSERTION N/A 09/06/2019   Procedure: TANDEM RING INSERTION;  Surgeon: Gery Pray, MD;  Location: [facility];  Service: Urology;  Laterality: N/A;   TANDEM RING INSERTION N/A 09/11/2019   Procedure: TANDEM RING INSERTION;  Surgeon: Gery Pray, MD;  Location: [facility];  Service: Urology;  Laterality: N/A;   THROMBECTOMY FEMORAL ARTERY Left 05/18/2018   Procedure: LEFT LEG THROMBECTOMY, BALLOON ANGIOPLASTY LEFT POSTERIOR TIBIAL ARTERY;  Surgeon: Waynetta Sandy, MD;  Location: [place];  Service: Vascular;  Laterality: Left;   TUBAL LIGATION      Past Medical Hx:  Past Medical History:  Diagnosis Date   Cancer ([place])    cervical   DVT (deep vein thrombosis) in pregnancy    History of radiation therapy 08/10/2019   cervix 07/09/2019-08/10/2019  Dr Gery Pray   History of radiation therapy 09/11/2019   vaginal brachytherapy 08/14/2019-09/11/2019  Dr Gery Pray   Hypertension    Obstructive thrombus 2019   left leg   Sciatica    back    Past Gynecological History:  See HPI No LMP recorded. (Menstrual status: Other).  Family Hx:  Family History  Problem Relation Age of Onset   Cancer Mother        brain and uterine   Stroke Brother    Diabetes Brother    Clotting disorder Brother    Breast cancer Neg Hx     Review of Systems:  Constitutional  Feels well,   ENT Normal appearing ears and nares bilaterally Skin/Breast  No rash, sores, jaundice, itching, dryness Cardiovascular  No chest pain, shortness of breath, or edema  Pulmonary  No cough or wheeze.  Gastro Intestinal  No nausea, vomitting, or  diarrhoea. No bright red blood per rectum, no abdominal pain, change in bowel movement, or constipation.  Genito Urinary  No frequency, urgency, dysuria, no bleeding.  Musculo Skeletal  No myalgia, arthralgia, joint swelling or pain  Neurologic  No weakness, numbness, change in gait,  Psychology  No depression, anxiety, insomnia.   Vitals:  There were no vitals taken for this visit.  Physical Exam: WD in NAD Neck  Supple NROM, without any enlargements.  Lymph Node Survey No cervical supraclavicular or inguinal adenopathy Cardiovascular  Well perfused peripheries.  Lungs  No increased WOB Skin  No rash/lesions/breakdown  Psychiatry  Alert and oriented to person, place, and time  Abdomen  Normoactive bowel sounds, abdomen soft, non-tender and obese without evidence of hernia.  Back No CVA tenderness Genito Urinary  Vulva/vagina: Normal external female genitalia.  No lesions. No discharge or bleeding.  Bladder/urethra:  No lesions or masses, well supported bladder  Vagina: grossly normal, posterior lip of cervix mass encroaches on posterior vaginal fornix.  Cervix: cervix posteriorly agglutinated with backwall of vagina. ***There is a 2x2cm area of ulceration with necrotic tissue at the base on the anterior right cervicovaginal junction at 10 o'clock. It is palpably soft without associated induration or nodularity.   Uterus:  Slightly bulky, mobile, no parametrial involvement or nodularity.  Adnexa: no discrete masses. Rectal  Good tone, no masses no cul de sac nodularity.  Extremities  No bilateral cyanosis, clubbing or edema.  Thereasa Solo, MD  06/14/2021, 6:39 PM

## 2021-06-15 ENCOUNTER — Ambulatory Visit: Payer: 59 | Admitting: Gynecologic Oncology

## 2021-06-23 ENCOUNTER — Encounter: Payer: Self-pay | Admitting: Gynecologic Oncology

## 2021-06-23 NOTE — Progress Notes (Unsigned)
Follow-up Note: Gyn-Onc  Consult was requested by Dr. Hulan Fray for the evaluation of DEMETRICA ZIPP 50 y.o. female  CC:  No chief complaint on file.   Assessment/Plan:  Ms. MAROLYN URSCHEL  is a 50 y.o.  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   Complete clinical response.  If she develops recurrence, would consider for exent vs salvage chemotherapy.  If benign, recommend continued 3 monthly follow-up until December, 2022. She will see Dr Sondra Come in 3 months and my partner, Dr Berline Lopes, in 6 months.   Pap annually in August/September.   HPI: Ms. Stephnie Parlier is a 7 year old P8 who was seen in consultation at the request of Dr. 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  The patient history began in August 2019 when she was diagnosed with a complex left lower extremity DVT that required femoral artery thrombectomy and placement of a stent in the left femoral artery.  She was placed on Xarelto and aspirin for following this procedure.  She had already been experiencing some irregular menses, however after commencing anticoagulant therapy her menstrual cycle became even heavier and more irregular with daily bleeding.  She cannot remember when she last had a Pap smear possibly in 2018 with her primary care provider [place].  She denies ever having an abnormal Pap smear.  She developed issues with her left lower extremity in the early part of 2020, and underwent a repeat abdominal aortogram with left lower extremity evaluation on October 26, 2018.  She had been seen in the emergency department in December 2019 for abnormal uterine bleeding where an ultrasound scan had been performed at that time that showed a uterus measuring 11.1 x 6.7 x 6.4 cm with subserosal fibroids and an endometrial thickness of 14 to 16 mm with no focal abnormality  visualized.  The left and right ovaries were grossly normal.  She followed up with Dr. Hulan Fray on April 26, 2019 due to persistent abnormal vaginal bleeding.  She was treated with Aygestin to control the bleeding.  An examination was performed no abnormalities on the cervix were noted in the examination findings from April 26, 2019.  An endometrial Pipelle biopsy was performed in the office at that time.  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  Is moderately differentiated.  A biopsy of a visible cervical lesion was performed on 06/01/19. This showed squamous cell carcinoma.   CT scan of the abdomen pelvis and chest was performed on June 05, 2019 and revealed no periaortic lymphadenopathy.  In the pelvis there was no gross lymphadenopathy, however several lymph nodes were visible less than 10 mm in short axis.  There is low attenuation within the endometrial canal of the uterine fundus.  The lower uterine segment and cervical regions did not appear to have extension beyond the however there was a rounded lesion on the anterior wall of the fundus measuring 2 cm corresponding to a fibroid.  An MRI of the pelvis was then performed which confirmed a 4.5 cm endocervical tumor without gross parametrial extension.  This confirmed likely primary cervical site.  She completed definitive chemoradiation for stage IB3 SCC of the cervix. Radiation included 45Gy EBRT  (in 25 fractions) and HDR brachytherapy with tandem and ring for 5.5 Gy in 5 fractions. Dates of therapy were 07/09/19 through 09/10/20. She received concurrent weekly  CDDP throughout (total of 5 cycles). She tolerated therapy well with no major delays or toxicities.   Post treatment PET on 3/ 8/21 showed no residual abnormal hypermetabolism in the cervix. There was vaginal wall thickening and presacral edema felt to be treatment related.   Interval Hx:  On 02/01/21 she experienced a new onset of vaginal  spotting. When she was seen on *** there was a necrotic appearing area noted at the anterior right cervicovaginal junction which was biopsied and showed ***.   ***  Current Meds:  Outpatient Encounter Medications as of 06/24/2021  Medication Sig   aspirin EC 81 MG EC tablet Take 1 tablet (81 mg total) by mouth daily.   cyclobenzaprine (FLEXERIL) 10 MG tablet Take 1 tablet (10 mg total) by mouth 2 (two) times daily as needed for muscle spasms.   lisinopril (ZESTRIL) 10 MG tablet Take 10 mg by mouth daily. (Patient not taking: Reported on 04/16/2021)   rivaroxaban (XARELTO) 20 MG TABS tablet Take 1 tablet (20 mg total) by mouth daily with supper.   XARELTO 20 MG TABS tablet Take 1 tablet by mouth once daily   No facility-administered encounter medications on file as of 06/24/2021.    Allergy: No Known Allergies  Social Hx:   Social History   Socioeconomic History   Marital status: Single    Spouse name: Not on file   Number of children: Not on file   Years of education: Not on file   Highest education level: 12th grade  Occupational History   Not on file  Tobacco Use   Smoking status: Former    Packs/day: 1.00    Years: 15.00    Pack years: 15.00    Types: Cigarettes    Quit date: 07/02/2016    Years since quitting: 4.9   Smokeless tobacco: Never  Vaping Use   Vaping Use: Never used  Substance and Sexual Activity   Alcohol use: Yes    Comment: rare   Drug use: Yes    Frequency: 7.0 times per week    Types: Marijuana    Comment: smokes blunt on occasion last blunt 07-12-2019   Sexual activity: Yes    Birth control/protection: Surgical  Other Topics Concern   Not on file  Social History Narrative   Not on file   Social Determinants of Health   Financial Resource Strain: Not on file  Food Insecurity: Not on file  Transportation Needs: Not on file  Physical Activity: Not on file  Stress: Not on file  Social Connections: Not on file  Intimate Partner Violence: Not on  file    Past Surgical Hx:  Past Surgical History:  Procedure Laterality Date   ABDOMINAL AORTOGRAM N/A 05/18/2018   Procedure: ABDOMINAL AORTOGRAM;  Surgeon: Waynetta Sandy, MD;  Location: [place] [facility];  Service: Cardiovascular;  Laterality: N/A;   ABDOMINAL AORTOGRAM W/LOWER EXTREMITY Left 10/26/2018   Procedure: ABDOMINAL AORTOGRAM W/LOWER EXTREMITY;  Surgeon: Waynetta Sandy, MD;  Location: [place] [facility];  Service: Cardiovascular;  Laterality: Left;   IR IMAGING GUIDED PORT INSERTION  07/05/2019   IR REMOVAL TUN ACCESS W/ PORT W/O FL MOD SED  12/25/2019   LOWER EXTREMITY ANGIOGRAPHY Bilateral 05/18/2018   Procedure: LOWER EXTREMITY ANGIOGRAPHY;  Surgeon: Waynetta Sandy, MD;  Location: [place] [facility];  Service: Cardiovascular;  Laterality: Bilateral;   OPERATIVE ULTRASOUND N/A 08/14/2019   Procedure: OPERATIVE ULTRASOUND;  Surgeon: Gery Pray, MD;  Location: [place];  Service: Urology;  Laterality: N/A;   OPERATIVE ULTRASOUND N/A 08/20/2019   Procedure: OPERATIVE ULTRASOUND;  Surgeon: Gery Pray, MD;  Location: [facility];  Service: Urology;  Laterality: N/A;   OPERATIVE ULTRASOUND N/A 08/29/2019   Procedure: OPERATIVE ULTRASOUND;  Surgeon: Gery Pray, MD;  Location: [facility];  Service: Urology;  Laterality: N/A;   OPERATIVE ULTRASOUND N/A 09/06/2019   Procedure: OPERATIVE ULTRASOUND;  Surgeon: Gery Pray, MD;  Location: [facility];  Service: Urology;  Laterality: N/A;   OPERATIVE ULTRASOUND N/A 09/11/2019   Procedure: OPERATIVE ULTRASOUND;  Surgeon: Gery Pray, MD;  Location: [facility];  Service: Urology;  Laterality: N/A;   TANDEM RING INSERTION N/A 08/14/2019   Procedure: TANDEM RING INSERTION - FIRST;  Surgeon: Gery Pray, MD;  Location: [facility];  Service: Urology;  Laterality: N/A;   TANDEM RING INSERTION N/A 08/20/2019    Procedure: TANDEM RING INSERTION;  Surgeon: Gery Pray, MD;  Location: [facility];  Service: Urology;  Laterality: N/A;   TANDEM RING INSERTION N/A 08/29/2019   Procedure: TANDEM RING INSERTION;  Surgeon: Gery Pray, MD;  Location: [facility];  Service: Urology;  Laterality: N/A;   TANDEM RING INSERTION N/A 09/06/2019   Procedure: TANDEM RING INSERTION;  Surgeon: Gery Pray, MD;  Location: [facility];  Service: Urology;  Laterality: N/A;   TANDEM RING INSERTION N/A 09/11/2019   Procedure: TANDEM RING INSERTION;  Surgeon: Gery Pray, MD;  Location: [facility];  Service: Urology;  Laterality: N/A;   THROMBECTOMY FEMORAL ARTERY Left 05/18/2018   Procedure: LEFT LEG THROMBECTOMY, BALLOON ANGIOPLASTY LEFT POSTERIOR TIBIAL ARTERY;  Surgeon: Waynetta Sandy, MD;  Location: [place];  Service: Vascular;  Laterality: Left;   TUBAL LIGATION      Past Medical Hx:  Past Medical History:  Diagnosis Date   Cancer ([place])    cervical   DVT (deep vein thrombosis) in pregnancy    History of radiation therapy 08/10/2019   cervix 07/09/2019-08/10/2019  Dr Gery Pray   History of radiation therapy 09/11/2019   vaginal brachytherapy 08/14/2019-09/11/2019  Dr Gery Pray   Hypertension    Obstructive thrombus 2019   left leg   Sciatica    back    Past Gynecological History:  See HPI No LMP recorded. (Menstrual status: Other).  Family Hx:  Family History  Problem Relation Age of Onset   Cancer Mother        brain and uterine   Stroke Brother    Diabetes Brother    Clotting disorder Brother    Breast cancer Neg Hx     Review of Systems:  Constitutional  Feels well,   ENT Normal appearing ears and nares bilaterally Skin/Breast  No rash, sores, jaundice, itching, dryness Cardiovascular  No chest pain, shortness of breath, or edema  Pulmonary  No cough or wheeze.  Gastro Intestinal  No nausea, vomitting, or  diarrhoea. No bright red blood per rectum, no abdominal pain, change in bowel movement, or constipation.  Genito Urinary  No frequency, urgency, dysuria, no bleeding.  Musculo Skeletal  No myalgia, arthralgia, joint swelling or pain  Neurologic  No weakness, numbness, change in gait,  Psychology  No depression, anxiety, insomnia.   Vitals:  There were no vitals taken for this visit.  Physical Exam: WD in NAD Neck  Supple NROM, without any enlargements.  Lymph Node Survey No cervical supraclavicular or inguinal adenopathy Cardiovascular  Well perfused peripheries.  Lungs  No increased WOB Skin  No rash/lesions/breakdown  Psychiatry  Alert and oriented to person, place, and time  Abdomen  Normoactive bowel sounds, abdomen soft, non-tender and obese without evidence of hernia.  Back No CVA tenderness Genito Urinary  Vulva/vagina: Normal external female genitalia.  No lesions. No discharge or bleeding.  Bladder/urethra:  No lesions or masses, well supported bladder  Vagina: grossly normal, posterior lip of cervix mass encroaches on posterior vaginal fornix.  Cervix: cervix posteriorly agglutinated with backwall of vagina. ***There is a 2x2cm area of ulceration with necrotic tissue at the base on the anterior right cervicovaginal junction at 10 o'clock. It is palpably soft without associated induration or nodularity.   Uterus:  Slightly bulky, mobile, no parametrial involvement or nodularity.  Adnexa: no discrete masses. Rectal  Good tone, no masses no cul de sac nodularity.  Extremities  No bilateral cyanosis, clubbing or edema.  Thereasa Solo, MD  06/23/2021, 7:20 AM

## 2021-06-24 ENCOUNTER — Inpatient Hospital Stay: Payer: 59 | Attending: Gynecologic Oncology | Admitting: Gynecologic Oncology

## 2021-06-24 DIAGNOSIS — C538 Malignant neoplasm of overlapping sites of cervix uteri: Secondary | ICD-10-CM

## 2021-07-01 ENCOUNTER — Ambulatory Visit: Payer: 59 | Admitting: Gynecologic Oncology

## 2021-08-21 ENCOUNTER — Other Ambulatory Visit: Payer: Self-pay

## 2021-08-21 DIAGNOSIS — I779 Disorder of arteries and arterioles, unspecified: Secondary | ICD-10-CM

## 2021-09-02 IMAGING — MR MR PELVIS WO/W CM
7 of 12 series · 19 of 48 positions shown · IV contrast (gadavist)
Comparison: None.

CLINICAL DATA: Newly diagnosed cervical squamous cell carcinoma.

EXAM:
MRI PELVIS WITHOUT AND WITH CONTRAST
TECHNIQUE: Multiplanar multisequence MR imaging of the pelvis was performed
both before and after administration of intravenous contrast.
CONTRAST:  10 mL Gadavist

[Series 3: T2 · coronal · 6.0mm · 0.86mm/px · 2 of 30 slices shown (1 of 4)]
[im 1/30]
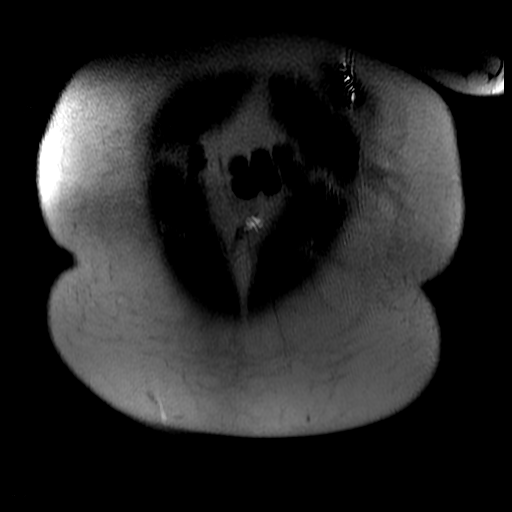
[im 30/30]
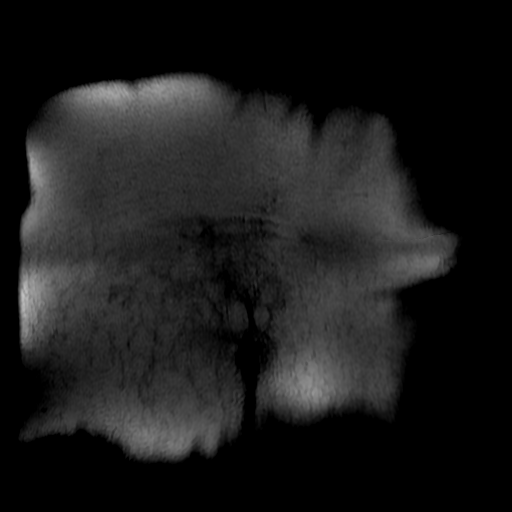

[Series 4: T2 · coronal · 5.0mm · 0.49mm/px · 3 of 33 slices shown (2 of 4)]
[im 1/33]
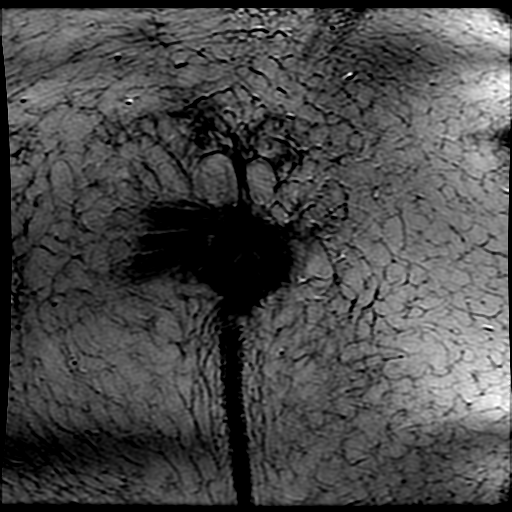
[im 17/33]
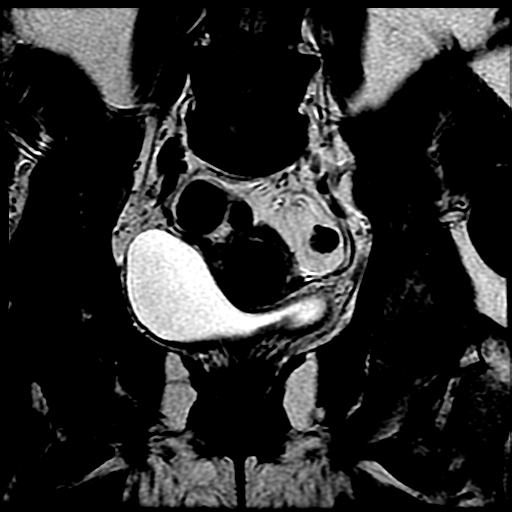
[im 33/33]
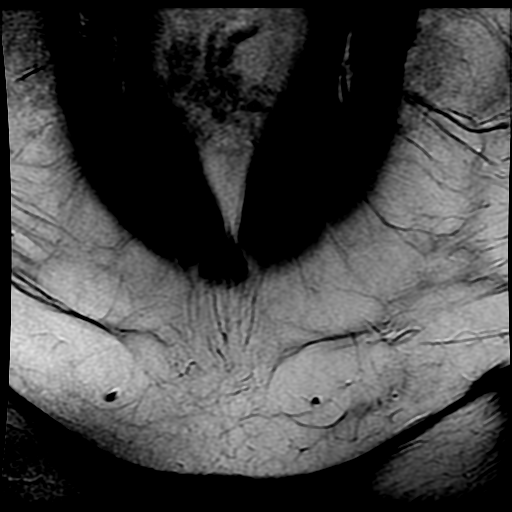

[Series 5: T2 · axial · 5.0mm · 0.47mm/px · z∈[-119,+109]mm · 3 of 39 slices shown (3 of 4)]
[im 1/39]
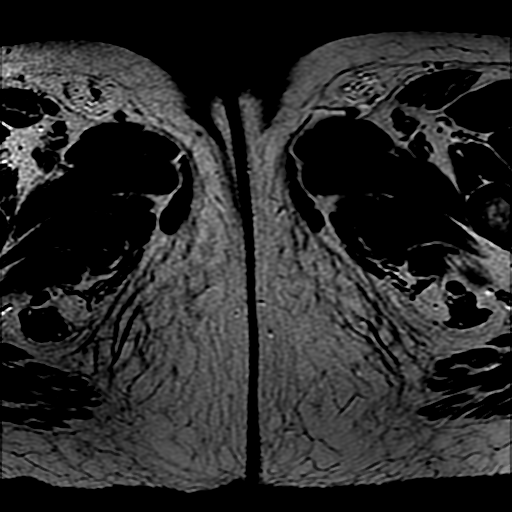
[im 20/39]
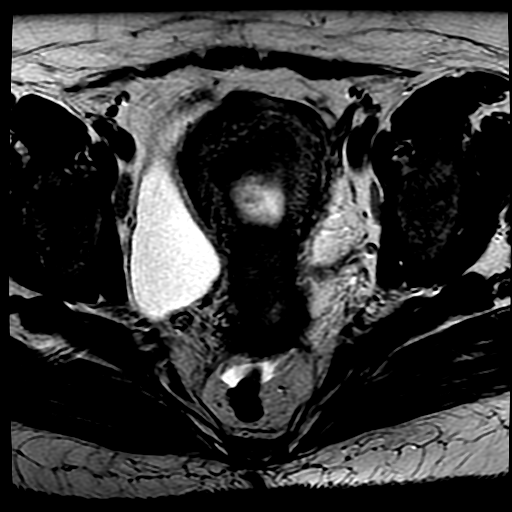
[im 39/39]
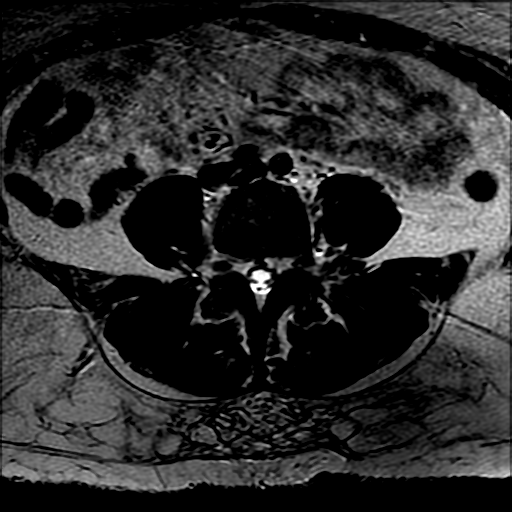

[Series 6: T2 fat-sat · axial · 5.0mm · 0.47mm/px · z∈[-119,+109]mm · 3 of 39 slices shown]
[im 1/39]
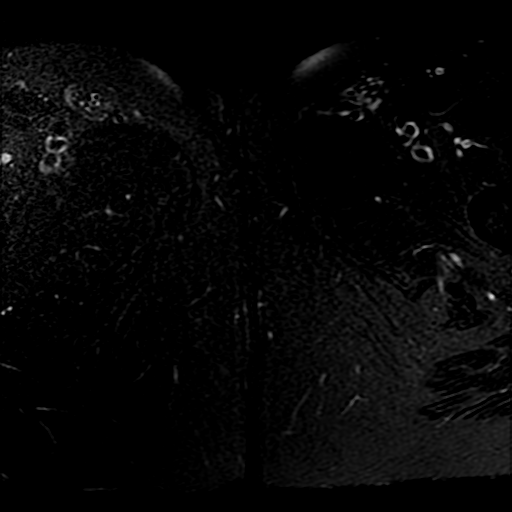
[im 20/39]
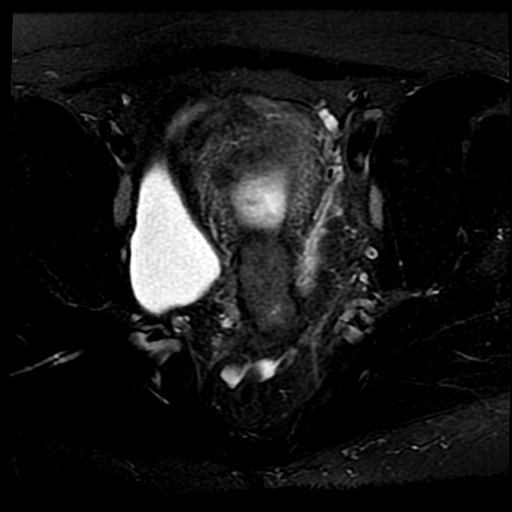
[im 39/39]
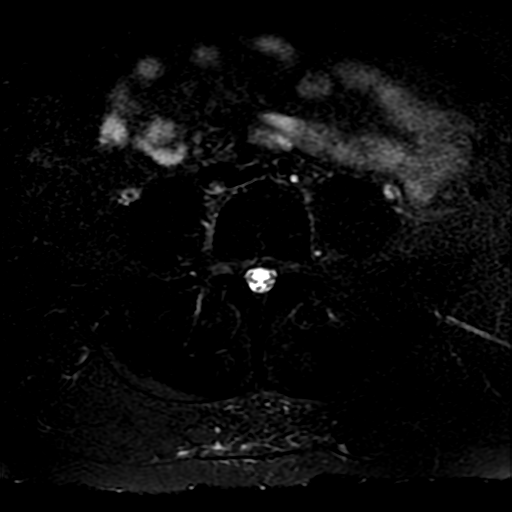

[Series 7: T2 · sagittal · 5.0mm · 0.47mm/px · 2 of 29 slices shown (4 of 4)]
[im 1/29]
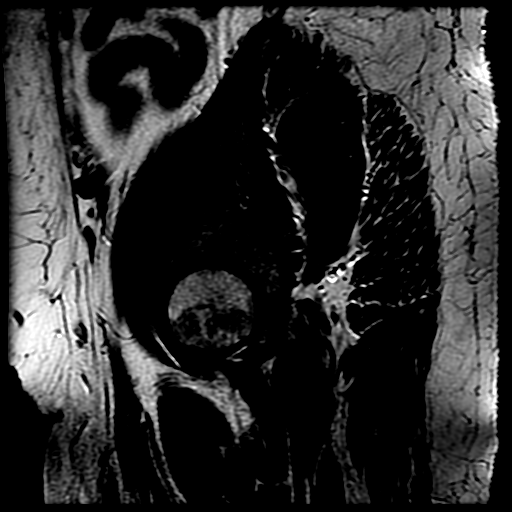
[im 29/29]
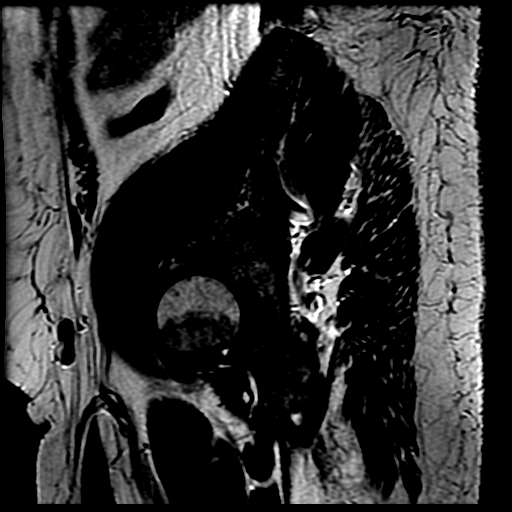

[Series 8: T1 · axial · 5.0mm · 0.47mm/px · z∈[-119,+109]mm · 3 of 39 slices shown]
[im 1/39]
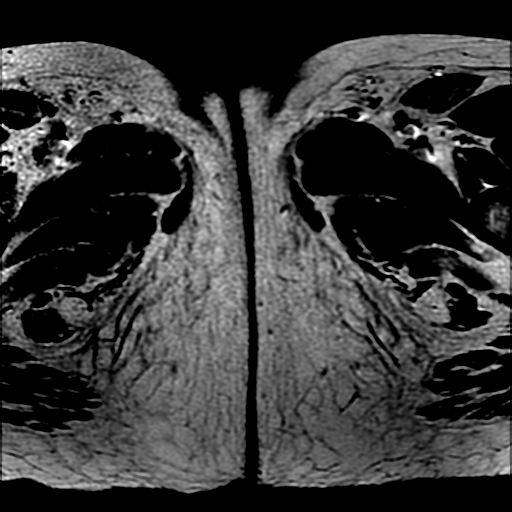
[im 20/39]
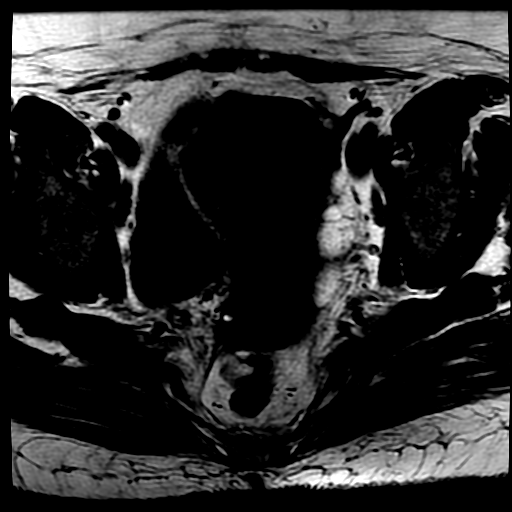
[im 39/39]
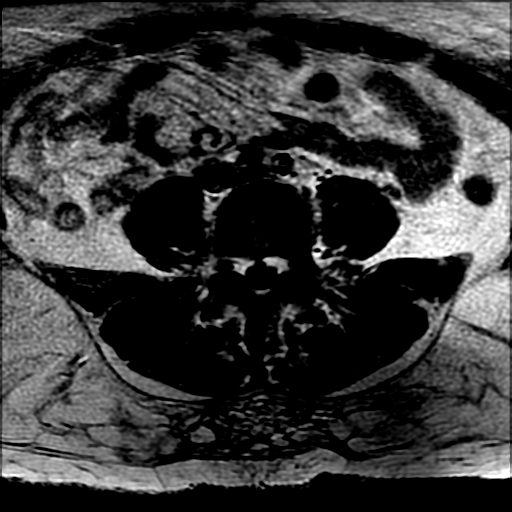

[Series 10: T1 dynamic · axial · 5.0mm · 0.51mm/px · z∈[-124,-57]mm · 3 of 96 slices shown]
[im 1/96]
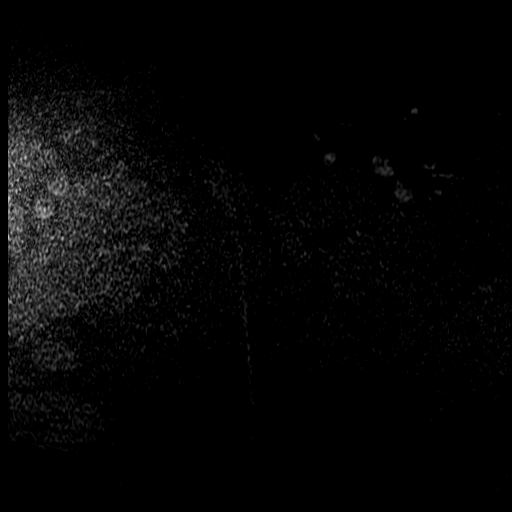
[im 14/96]
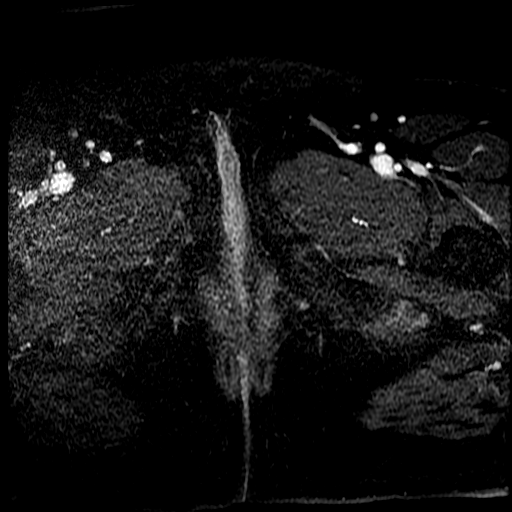
[im 28/96]
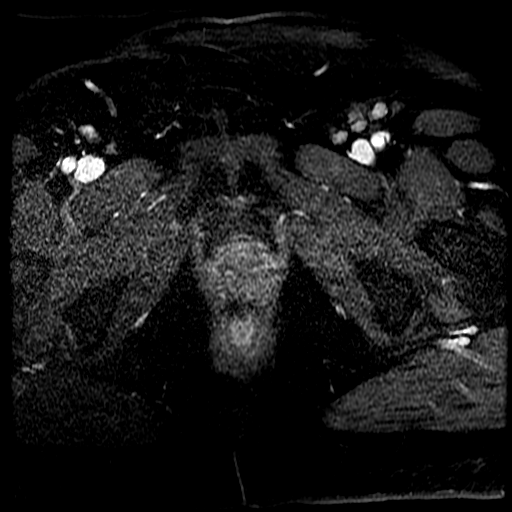

[19 of 48 positions shown; findings below may reference images not displayed]

FINDINGS: Lower Urinary Tract: No urinary bladder or urethral abnormality
identified.

Bowel: Unremarkable appearance of rectum and other pelvic bowel
loops.

Vascular/Lymphatic: Unremarkable. No pathologically enlarged pelvic
lymph nodes identified.

Reproductive:

-- Uterus: Measures 12.7 x 6.8 x 6.8 cm (volume = 310 cm^3). An
intramural fibroid is seen in the left anterior corpus which
measures 2.8 x 2.6 cm. Another tiny subserosal fibroid is seen in
the fundus measuring approximately 1.7 cm. In addition, there is
ill-defined thickening of the myometrial junctional zone in the
uterine fundus with a few tiny cystic foci, consistent with
adenomyosis.

Mild hematometros is seen. A barrel-shaped hypoenhancing soft tissue
mass is seen in the cervix which measures 4.5 by 2.8 x 2.4 cm,
consistent with cervical carcinoma. This appears to show deep
invasion of the cervical fibrous stroma greater than 5 mm. No
evidence of extra-uterine extension into the parametrial soft
tissues or vagina.

-- Right ovary: Appears normal. No mass or inflammatory process
identified.

-- Left ovary: Appears normal. No mass or inflammatory process
identified.

Other: No peritoneal thickening or abnormal free fluid.

Musculoskeletal:  Unremarkable.
IMPRESSION: 4.4 cm barrel shaped soft tissue mass in the cervix, consistent with
known cervical carcinoma. No evidence of parametrial involvement or
other pelvic metastatic disease. FIGO stage IB3 by imaging.

Mild hydrometros.

Small uterine fibroids and adenomyosis.

Normal appearance of both ovaries.  No adnexal mass identified.

## 2021-09-21 ENCOUNTER — Other Ambulatory Visit: Payer: Self-pay

## 2021-09-21 ENCOUNTER — Ambulatory Visit (INDEPENDENT_AMBULATORY_CARE_PROVIDER_SITE_OTHER): Payer: Self-pay | Admitting: Physician Assistant

## 2021-09-21 ENCOUNTER — Ambulatory Visit (HOSPITAL_COMMUNITY)
Admission: RE | Admit: 2021-09-21 | Discharge: 2021-09-21 | Disposition: A | Discharge status: Home / Self Care | Payer: Self-pay | Source: Ambulatory Visit | Attending: Physician Assistant | Admitting: Physician Assistant

## 2021-09-21 VITALS — BP 115/79 | HR 61 | Resp 20 | Ht 66.0 in | Wt 234.0 lb

## 2021-09-21 DIAGNOSIS — I779 Disorder of arteries and arterioles, unspecified: Secondary | ICD-10-CM | POA: Insufficient documentation

## 2021-09-21 NOTE — Progress Notes (Signed)
HISTORY AND PHYSICAL     CC:  follow up. Requesting Provider:  Marliss Coots, NP  HPI: This is a 50 y.o. female who is here today for follow up for PAD.  Her initial presentation in 2019 was an occluded left lower extremity and she underwent thromboembolectomy on May 18, 2018.  She was placed on Xarelto and has maintained this.  Her last arteriogram was October 26, 2018 for complaints of pain and numbness in her left foot and a toe brachial index of 0.  No intervention was performed.  Pt was last seen a year ago and at that time, she had bilateral knee pain but no claudication or rest pain.    The pt returns today for follow up.  She states that she has been doing well.  She denies any claudication, rest pain or non healing wounds.  She states she has some BUE numbness that did improve with prednisone and since she has finished this, it is returning.  She has not seen a spine or ortho MD.   She continues to take her Xarelto.    The pt is not on a statin for cholesterol management.    The pt is on an aspirin.    Other AC:  Xarelto The pt is not on medication for hypertension.  The pt does not have diabetes. Tobacco hx:  former  Pt does not have family hx of AAA.  Past Medical History:  Diagnosis Date   Cancer ([place])    cervical   DVT (deep vein thrombosis) in pregnancy    History of radiation therapy 08/10/2019   cervix 07/09/2019-08/10/2019  Dr Gery Pray   History of radiation therapy 09/11/2019   vaginal brachytherapy 08/14/2019-09/11/2019  Dr Gery Pray   Hypertension    Obstructive thrombus 2019   left leg   Sciatica    back    Past Surgical History:  Procedure Laterality Date   ABDOMINAL AORTOGRAM N/A 05/18/2018   Procedure: ABDOMINAL AORTOGRAM;  Surgeon: Waynetta Sandy, MD;  Location: [place] [facility];  Service: Cardiovascular;  Laterality: N/A;   ABDOMINAL AORTOGRAM W/LOWER EXTREMITY Left 10/26/2018   Procedure: ABDOMINAL AORTOGRAM W/LOWER  EXTREMITY;  Surgeon: Waynetta Sandy, MD;  Location: [place] [facility];  Service: Cardiovascular;  Laterality: Left;   IR IMAGING GUIDED PORT INSERTION  07/05/2019   IR REMOVAL TUN ACCESS W/ PORT W/O FL MOD SED  12/25/2019   LOWER EXTREMITY ANGIOGRAPHY Bilateral 05/18/2018   Procedure: LOWER EXTREMITY ANGIOGRAPHY;  Surgeon: Waynetta Sandy, MD;  Location: [place] [facility];  Service: Cardiovascular;  Laterality: Bilateral;   OPERATIVE ULTRASOUND N/A 08/14/2019   Procedure: OPERATIVE ULTRASOUND;  Surgeon: Gery Pray, MD;  Location: [facility]  Service: Urology;  Laterality: N/A;   OPERATIVE ULTRASOUND N/A 08/20/2019   Procedure: OPERATIVE ULTRASOUND;  Surgeon: Gery Pray, MD;  Location: [facility];  Service: Urology;  Laterality: N/A;   OPERATIVE ULTRASOUND N/A 08/29/2019   Procedure: OPERATIVE ULTRASOUND;  Surgeon: Gery Pray, MD;  Location: [facility];  Service: Urology;  Laterality: N/A;   OPERATIVE ULTRASOUND N/A 09/06/2019   Procedure: OPERATIVE ULTRASOUND;  Surgeon: Gery Pray, MD;  Location: [facility];  Service: Urology;  Laterality: N/A;   OPERATIVE ULTRASOUND N/A 09/11/2019   Procedure: OPERATIVE ULTRASOUND;  Surgeon: Gery Pray, MD;  Location: [facility];  Service: Urology;  Laterality: N/A;   TANDEM RING INSERTION N/A 08/14/2019   Procedure: TANDEM RING INSERTION -  FIRST;  Surgeon: Gery Pray, MD;  Location: [facility];  Service: Urology;  Laterality: N/A;   TANDEM RING INSERTION N/A 08/20/2019   Procedure: TANDEM RING INSERTION;  Surgeon: Gery Pray, MD;  Location:  [facility];  Service: Urology;  Laterality: N/A;   TANDEM RING INSERTION N/A 08/29/2019   Procedure: TANDEM RING INSERTION;  Surgeon: Gery Pray, MD;  Location: [facility];  Service: Urology;  Laterality: N/A;   TANDEM RING INSERTION N/A 09/06/2019    Procedure: TANDEM RING INSERTION;  Surgeon: Gery Pray, MD;  Location: [facility];  Service: Urology;  Laterality: N/A;   TANDEM RING INSERTION N/A 09/11/2019   Procedure: TANDEM RING INSERTION;  Surgeon: Gery Pray, MD;  Location: [facility];  Service: Urology;  Laterality: N/A;   THROMBECTOMY FEMORAL ARTERY Left 05/18/2018   Procedure: LEFT LEG THROMBECTOMY, BALLOON ANGIOPLASTY LEFT POSTERIOR TIBIAL ARTERY;  Surgeon: Waynetta Sandy, MD;  Location: [facility];  Service: Vascular;  Laterality: Left;   TUBAL LIGATION      No Known Allergies  Current Outpatient Medications  Medication Sig Dispense Refill   aspirin EC 81 MG EC tablet Take 1 tablet (81 mg total) by mouth daily.     cyclobenzaprine (FLEXERIL) 10 MG tablet Take 1 tablet (10 mg total) by mouth 2 (two) times daily as needed for muscle spasms. 20 tablet 0   rivaroxaban (XARELTO) 20 MG TABS tablet Take 1 tablet (20 mg total) by mouth daily with supper. 30 tablet 11   lisinopril (ZESTRIL) 10 MG tablet Take 10 mg by mouth daily. (Patient not taking: Reported on 04/16/2021)     No current facility-administered medications for this visit.    Family History  Problem Relation Age of Onset   Cancer Mother        brain and uterine   Stroke Brother    Diabetes Brother    Clotting disorder Brother    Breast cancer Neg Hx     Social History   Socioeconomic History   Marital status: Single    Spouse name: Not on file   Number of children: Not on file   Years of education: Not on file   Highest education level: 12th grade  Occupational History   Not on file  Tobacco Use   Smoking status: Former    Packs/day: 1.00    Years: 15.00    Pack years: 15.00    Types: Cigarettes    Quit date: 07/02/2016    Years since quitting: 5.2   Smokeless tobacco: Never  Vaping Use   Vaping Use: Never used  Substance and Sexual Activity   Alcohol use: Yes    Comment: rare   Drug use: Yes     Frequency: 7.0 times per week    Types: Marijuana    Comment: smokes blunt on occasion last blunt 07-12-2019   Sexual activity: Yes    Birth control/protection: Surgical  Other Topics Concern   Not on file  Social History Narrative   Not on file   Social Determinants of Health   Financial Resource Strain: Not on file  Food Insecurity: Not on file  Transportation Needs: Not on file  Physical Activity: Not on file  Stress: Not on file  Social Connections: Not on file  Intimate Partner Violence: Not on file     REVIEW OF SYSTEMS:   [X]  denotes positive finding, [ ]  denotes negative finding Cardiac  Comments:  Chest pain or chest  pressure:    Shortness of breath upon exertion:    Short of breath when lying flat:    Irregular heart rhythm:        Vascular    Pain in calf, thigh, or hip brought on by ambulation:    Pain in feet at night that wakes you up from your sleep:     Blood clot in your veins:    Leg swelling:         Pulmonary    Oxygen at home:    Productive cough:     Wheezing:         Neurologic    Sudden weakness in arms or legs:     Sudden numbness in arms or legs:     Sudden onset of difficulty speaking or slurred speech:    Temporary loss of vision in one eye:     Problems with dizziness:         Gastrointestinal    Blood in stool:     Vomited blood:         Genitourinary    Burning when urinating:     Blood in urine:        Psychiatric    Major depression:         Hematologic    Bleeding problems:    Problems with blood clotting too easily:        Skin    Rashes or ulcers:        Constitutional    Fever or chills:      PHYSICAL EXAMINATION:  Today's Vitals   09/21/21 1007  BP: 115/79  Pulse: 61  Resp: 20  SpO2: 98%  Weight: 234 lb (106.1 kg)  Height: 5\' 6"  (1.676 m)   Body mass index is 37.77 kg/m.   General:  WDWN in NAD; vital signs documented above Gait: Not observed HENT: WNL, normocephalic Pulmonary: normal  non-labored breathing , without wheezing Cardiac: regular HR, without carotid bruits Abdomen: soft, NT, no masses; aortic pulse is not palpable Skin: without rashes Vascular Exam/Pulses:  Right Left  Radial 2+ (normal) 2+ (normal)  DP triphasic triphasic  PT absent triphasic  Peroneal monophasic monophasic   Extremities: without ischemic changes, without Gangrene , without cellulitis; without open wounds;  Musculoskeletal: no muscle wasting or atrophy  Neurologic: A&O X 3 Psychiatric:  The pt has Normal affect.   Non-Invasive Vascular Imaging:   ABI's/TBI's on 09/21/2021: Right:  1.23/0.75 - Great toe pressure: 82 Left:  1.18/0 - Great toe pressure: 0   Previous ABI's/TBI's on 09/18/2020: Right:  1.17/0.83 - Great toe pressure: 98 Left:  1.01/0 - Great toe pressure:  0   ASSESSMENT/PLAN:: 50 y.o. female here for follow up for PAD  -ABI essentially unchanged and she has triphasic doppler signals bilateral DP.  She continues to have a left toe pressure of zero.  She does not have any claudication/rest pain or non healing wounds. -she will continue her Xarelto and asa  -pt will f/u in one year with ABI.  She knows to call sooner if she develops any non healing wounds or rest pain.     Leontine Locket, [facility] [phone]  Clinic MD:   Donzetta Matters on call MD

## 2021-10-18 NOTE — Progress Notes (Signed)
Radiation Oncology         [phone] ________________________________  Name: Emma Medina MRN: [id number]  Date: 10/19/2021  DOB: [birth year]  Follow-Up Visit Note  CC: Placey, Audrea Muscat, NP  Placey, Audrea Muscat, NP    ICD-10-CM   1. 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 Since her last visit, the patient followed up with Dr. Denman George on 06/15/21. During which time, physical exam performed again noted the area of ulceration with necrotic tissue at the base of the anterior right cervicovaginal junction, measuring 2 x 2 cm (characterized as palpably soft without associated induration or nodularity). Dr. Denman George noted that if the patient develops a recurrence, considerations would include surgery (pelvic exenteration) vs salvage therapy. If she remains without recurrence, 3 month follows ups will be maintained.   Biopsy of this area showed no evidence of malignancy.  Pertinent imaging since the patient was last seen includes:  --LE doppler study on 09/21/21 (for follow up of history of PAD)  showed no evidence of significant left lower extremity arterial disease.        She denies any vaginal bleeding or discharge.  She denies any abdominal bloating or pelvic pain.  She is not using her vaginal dilator however she is sexually active.  She denies any pain with intercourse or postcoital bleeding.                Allergies:  has No Known Allergies.  Meds: Current Outpatient Medications  Medication  Sig Dispense Refill   aspirin EC 81 MG EC tablet Take 1 tablet (81 mg total) by mouth daily.     cyclobenzaprine (FLEXERIL) 10 MG tablet Take 1 tablet (10 mg total) by mouth 2 (two) times daily as needed for muscle spasms. 20 tablet 0   gabapentin (NEURONTIN) 300 MG capsule Take 300 mg by mouth 2 (two) times daily.     rivaroxaban (XARELTO) 20 MG TABS tablet Take 1 tablet (20 mg total) by mouth daily with supper. 30 tablet 11   lisinopril (ZESTRIL) 10 MG tablet Take 10 mg by mouth daily. (Patient not taking: Reported on 04/16/2021)     No current facility-administered medications for this encounter.    Physical Findings: The patient is in no acute distress. Patient is alert and oriented.  height is 5\' 6"  (1.676 m) and weight is 239 lb (108.4 kg). Her temporal temperature is 96.9 F (36.1 C) (abnormal). Her blood pressure is 118/76 and her pulse is 83. Her respiration is 18 and oxygen saturation is 100%. .  Lungs are clear to auscultation bilaterally. Heart has regular rate and rhythm. No palpable cervical, supraclavicular, or axillary adenopathy. Abdomen soft, non-tender, normal bowel sounds.  On pelvic examination the external genitalia were unremarkable. A speculum exam was performed. There are no mucosal lesions noted in the vaginal vault.  The cervix is visualized and shows no areas of necrosis at this time.  Some erythema to the region of the  cervical os.  On bimanual and rectovaginal examination there are no pelvic masses appreciated.  The cervix appears to be normal in size.  Rectal sphincter tone normal.   Lab Findings: Lab Results  Component Value Date   WBC 4.6 12/25/2019   HGB 13.1 12/25/2019   HCT 40.9 12/25/2019   MCV 86.3 12/25/2019   PLT 288 12/25/2019    Radiographic Findings: VAS [place] ABI WITH/WO TBI  Result Date: 09/21/2021  LOWER EXTREMITY DOPPLER STUDY Patient Name:  Emma Medina  Date of Exam:   09/21/2021 Medical Rec #: [id number]        Accession #:    [id number]  Date of Birth: 03-May-1971        Patient Gender: F Patient Age:   51 years Exam [facility] Procedure:      VAS [place] ABI WITH/WO TBI Referring Phys: Servando Snare --------------------------------------------------------------------------------  Indications: Peripheral artery disease. High Risk Factors: Hypertension.  Vascular Interventions: Left common iliac thrombectomy, and posterior tibial                         artery angioplasty on 05/18/2018. Performing Technologist: Ralene Cork RVT  Examination Guidelines: A complete evaluation includes at minimum, Doppler waveform signals and systolic blood pressure reading at the level of bilateral brachial, anterior tibial, and posterior tibial arteries, when vessel segments are accessible. Bilateral testing is considered an integral part of a complete examination. Photoelectric Plethysmograph (PPG) waveforms and toe systolic pressure readings are included as required and additional duplex testing as needed. Limited examinations for reoccurring indications may be performed as noted.  ABI Findings: +---------+------------------+-----+---------+--------+  Right     Rt Pressure (mmHg) Index Waveform  Comment   +---------+------------------+-----+---------+--------+  Brachial  110                                          +---------+------------------+-----+---------+--------+  PTA       135                1.23  triphasic           +---------+------------------+-----+---------+--------+  DP        111                1.01  triphasic           +---------+------------------+-----+---------+--------+  Great Toe 82                 0.75                      +---------+------------------+-----+---------+--------+ +---------+------------------+-----+----------+---------------------------+  Left      Lt Pressure (mmHg) Index Waveform   Comment                      +---------+------------------+-----+----------+---------------------------+  Brachial  108                                                               +---------+------------------+-----+----------+---------------------------+  PTA       100                0.91  monophasic  barely discernable waveform  +---------+------------------+-----+----------+---------------------------+  DP        130                1.18  triphasic                               +---------+------------------+-----+----------+---------------------------+  Great Toe 0                  0.00  Absent                                  +---------+------------------+-----+----------+---------------------------+ +-------+-----------+-----------+------------+------------+  ABI/TBI Today's ABI Today's TBI Previous ABI Previous TBI  +-------+-----------+-----------+------------+------------+  Right   1.23        0.75        1.17         0.82          +-------+-----------+-----------+------------+------------+  Left    1.18        0           1.01         0             +-------+-----------+-----------+------------+------------+  Previous ABI on 09/18/20.  Summary: Right: Resting right ankle-brachial index is within normal range. No evidence of significant right lower extremity arterial disease. The right toe-brachial index is normal. Left: Resting left ankle-brachial index is within normal range. No evidence of significant left lower extremity arterial disease. The left toe-brachial index is abnormal.  *See table(s) above for measurements and observations.  Electronically signed by Servando Snare MD on 09/21/2021 at 4:40:44 PM.    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  Plan: She is now 2 years out from her treatment and can progress to 19-month interval follow-up.  She will follow-up with Dr. Berline Lopes in 6 months.  The patient would be due for Pap smear at that time.  Routine follow-up in radiation oncology in 1 year.   20 minutes of total time was spent for  this patient encounter, including preparation, face-to-face counseling with the patient and coordination of care, physical exam, and documentation of the encounter. ____________________________________  Blair Promise, PhD, MD  This document serves as a record of services personally performed by Gery Pray, MD. It was created on his behalf by Roney Mans, a trained medical scribe. The creation of this record is based on the scribe's personal observations and the provider's statements to them. This document has been checked and approved by the attending provider.

## 2021-10-19 ENCOUNTER — Other Ambulatory Visit: Payer: Self-pay

## 2021-10-19 ENCOUNTER — Ambulatory Visit
Admission: RE | Admit: 2021-10-19 | Discharge: 2021-10-19 | Disposition: A | Discharge status: Home / Self Care | Payer: Self-pay | Source: Ambulatory Visit | Attending: Radiation Oncology | Admitting: Radiation Oncology

## 2021-10-19 ENCOUNTER — Encounter: Payer: Self-pay | Admitting: Radiation Oncology

## 2021-10-19 VITALS — BP 118/76 | HR 83 | Temp 96.9°F | Resp 18 | Ht 66.0 in | Wt 239.0 lb

## 2021-10-19 DIAGNOSIS — Z79899 Other long term (current) drug therapy: Secondary | ICD-10-CM | POA: Insufficient documentation

## 2021-10-19 DIAGNOSIS — C538 Malignant neoplasm of overlapping sites of cervix uteri: Secondary | ICD-10-CM

## 2021-10-19 DIAGNOSIS — Z7901 Long term (current) use of anticoagulants: Secondary | ICD-10-CM | POA: Insufficient documentation

## 2021-10-19 DIAGNOSIS — Z8541 Personal history of malignant neoplasm of cervix uteri: Secondary | ICD-10-CM | POA: Diagnosis present

## 2021-10-19 DIAGNOSIS — Z7982 Long term (current) use of aspirin: Secondary | ICD-10-CM | POA: Diagnosis not present

## 2021-10-19 HISTORY — DX: Malignant neoplasm of cervix uteri, unspecified: C53.9

## 2021-10-19 NOTE — Progress Notes (Signed)
Emma Medina is here today for follow up post radiation to the pelvis.  They completed their radiation on: 09/11/2019   Does the patient complain of any of the following:  Pain:denies Abdominal bloating: denies Diarrhea/Constipation: denies Nausea/Vomiting: denies Vaginal Discharge: denies Blood in Urine or Stool: denies Urinary Issues (dysuria/incomplete emptying/ incontinence/ increased frequency/urgency): frequency, nocturia x2-3 Does patient report using vaginal dilator 2-3 times a week and/or sexually active 2-3 weeks: no Post radiation skin changes: denies   Additional comments if applicable: none  Vitals:   10/19/21 1531  BP: 118/76  Pulse: 83  Resp: 18  Temp: (!) 96.9 F (36.1 C)  TempSrc: Temporal  SpO2: 100%  Weight: 239 lb (108.4 kg)  Height: 5\' 6"  (1.676 m)

## 2022-01-21 ENCOUNTER — Emergency Department (HOSPITAL_BASED_OUTPATIENT_CLINIC_OR_DEPARTMENT_OTHER): Payer: Commercial Managed Care - HMO

## 2022-01-21 ENCOUNTER — Emergency Department (HOSPITAL_BASED_OUTPATIENT_CLINIC_OR_DEPARTMENT_OTHER)
Admission: EM | Admit: 2022-01-21 | Discharge: 2022-01-21 | Disposition: A | Discharge status: Home / Self Care | Payer: Commercial Managed Care - HMO | Attending: Physician Assistant | Admitting: Physician Assistant

## 2022-01-21 ENCOUNTER — Other Ambulatory Visit: Payer: Self-pay

## 2022-01-21 ENCOUNTER — Encounter (HOSPITAL_BASED_OUTPATIENT_CLINIC_OR_DEPARTMENT_OTHER): Payer: Self-pay | Admitting: Emergency Medicine

## 2022-01-21 DIAGNOSIS — M19042 Primary osteoarthritis, left hand: Secondary | ICD-10-CM | POA: Insufficient documentation

## 2022-01-21 DIAGNOSIS — Z7901 Long term (current) use of anticoagulants: Secondary | ICD-10-CM | POA: Insufficient documentation

## 2022-01-21 DIAGNOSIS — M79645 Pain in left finger(s): Secondary | ICD-10-CM | POA: Diagnosis present

## 2022-01-21 DIAGNOSIS — Z7982 Long term (current) use of aspirin: Secondary | ICD-10-CM | POA: Insufficient documentation

## 2022-01-21 MED ORDER — DICLOFENAC SODIUM 1 % EX GEL: 2.0000 g | Freq: Four times a day (QID) | CUTANEOUS | 1 refills | Status: DC

## 2022-01-21 NOTE — ED Notes (Signed)
X-ray at bedside

## 2022-01-21 NOTE — Discharge Instructions (Signed)
Return if any problems.

## 2022-01-21 NOTE — ED Provider Notes (Signed)
?[place] [facility] DEPT ?Provider Note ? ? ?CSN: [id number] ?Arrival date & time: 01/21/22  1119 ? ?  ? ?History ? ?Chief Complaint  ?Patient presents with  ? Finger Injury  ? ? ?Emma Medina is a 51 y.o. female. ? ?Pt complains of pain in her left thumb.  Pt reports she has had pain for several months but it is getting worse.  ? ?The history is provided by the patient. No language interpreter was used.  ?Hand Pain ?This is a new problem. Episode onset: month. The problem occurs constantly. The problem has been gradually worsening. Nothing aggravates the symptoms. Nothing relieves the symptoms. She has tried nothing for the symptoms. The treatment provided no relief.  ? ?  ? ?Home Medications ?Prior to Admission medications   ?Medication Sig Start Date End Date Taking? Authorizing Provider  ?diclofenac Sodium (VOLTAREN) 1 % GEL Apply 2 g topically 4 (four) times daily. 01/21/22  Yes Fransico Meadow, PA-C  ?aspirin EC 81 MG EC tablet Take 1 tablet (81 mg total) by mouth daily. 05/23/18   Ulyses Amor, PA-C  ?cyclobenzaprine (FLEXERIL) 10 MG tablet Take 1 tablet (10 mg total) by mouth 2 (two) times daily as needed for muscle spasms. 11/30/19   Faustino Congress, NP  ?gabapentin (NEURONTIN) 300 MG capsule Take 300 mg by mouth 2 (two) times daily. 09/22/21   [provider]  ?lisinopril (ZESTRIL) 10 MG tablet Take 10 mg by mouth daily. ?Patient not taking: Reported on 04/16/2021 07/31/20   [provider]  ?rivaroxaban (XARELTO) 20 MG TABS tablet Take 1 tablet (20 mg total) by mouth daily with supper. 04/28/21   Ulyses Amor, PA-C  ?   ? ?Allergies    ?Patient has no known allergies.   ? ?Review of Systems   ?Review of Systems  ?Musculoskeletal:  Positive for joint swelling and myalgias.  ?All other systems reviewed and are negative. ? ?Physical Exam ?Updated Vital Signs ?BP 126/89 (BP Location: Right Arm)   Pulse (!) 57   Temp 98.1 ?F (36.7 ?C) (Oral)   Resp 16   Ht '5\' 7"'$   (1.702 m)   Wt 104.3 kg   LMP 06/13/2019 Comment: radiation tx cervical ca  SpO2 99%   BMI 36.02 kg/m?  ?Physical Exam ?Vitals reviewed.  ?HENT:  ?   Head: Normocephalic and atraumatic.  ?Musculoskeletal:     ?   General: Tenderness present. No swelling.  ?   Comments: Tender left thumb at base.  Pain with range of motion,  nv and ns intact,    ?Skin: ?   General: Skin is warm.  ?Neurological:  ?   General: No focal deficit present.  ?   Mental Status: She is alert.  ?Psychiatric:     ?   Mood and Affect: Mood normal.  ? ? ?ED Results / Procedures / Treatments   ?Labs ?(all labs ordered are listed, but only abnormal results are displayed) ?Labs Reviewed - No data to display ? ?EKG ?None ? ?Radiology ?DG Finger Thumb Left ? ?Result Date: 01/21/2022 ?CLINICAL DATA:  Left thumb pain EXAM: LEFT THUMB 2+V COMPARISON:  None. FINDINGS: There is no evidence of acute fracture. There is moderate metacarpophalangeal joint and interphalangeal joint osteoarthritis. No bony erosions. IMPRESSION: No acute osseous abnormality. 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 Electronically Signed   By: Maurine Simmering M.D.   On: 01/21/2022 12:39   ? ?Procedures ?Procedures  ? ? ?Medications Ordered in ED ?Medications -  No data to display ? ?ED Course/ Medical Decision Making/ A&P ?  ?                        ?Medical Decision Making ?Amount and/or Complexity of Data Reviewed ?Radiology: ordered. ? ? ?MDM:  Xray shows mcp and IP joint osteoarthritis Pt counseled on results.  I will try pt on voltaren gel.  Splint applied.  I advised follow up with Dr. Clayton Bibles surgeon for evatluion. ? ? ? ? ? ? ? ?Final Clinical Impression(s) / ED Diagnoses ?Final diagnoses:  ?Degenerative arthritis of finger, left  ? ? ?Rx / DC Orders ?ED Discharge Orders   ? ?      Ordered  ?  diclofenac Sodium (VOLTAREN) 1 % GEL  4 times daily       ? 01/21/22 1306  ? ?  ?  ? ?  ? ?An After Visit Summary was printed and given to the patient. ? ?  ?Fransico Meadow,  [place] ?01/21/22 1313 ? ?  ?Blanchie Dessert, MD ?01/22/22 1522 ? ?

## 2022-01-21 NOTE — ED Notes (Signed)
EMT-P provided AVS using Teachback Method. Patient verbalizes understanding of Discharge Instructions. Opportunity for Questioning and Answers were provided by EMT-P. Patient Discharged from ED.  ? ?

## 2022-01-21 NOTE — ED Triage Notes (Signed)
Patient c/o left thumb pain ongoing for weeks and pain radiates into wrist. Denies any injury.  ?

## 2022-06-24 ENCOUNTER — Telehealth: Payer: Self-pay | Admitting: *Deleted

## 2022-06-24 NOTE — Telephone Encounter (Signed)
Attempted to reach the patient regarding "her call to schedule a follow up and new provider since Dr Denman George left." No answer and voicemail full   Patient to be scheduled with Dr Delsa Sale and patient will need a pap smear

## 2022-07-01 NOTE — Telephone Encounter (Signed)
Attempted to reach patient and setup follow up with Dr Delsa Sale.  Left message at 11:21am.

## 2022-07-05 NOTE — Telephone Encounter (Signed)
Spoke with the patient and scheduled a follow up appt  

## 2022-07-07 ENCOUNTER — Telehealth: Payer: Self-pay | Admitting: *Deleted

## 2022-07-07 NOTE — Telephone Encounter (Signed)
Per Dr Delsa Sale rescheduled the patient's appt from the afternoon to the morning

## 2022-07-13 ENCOUNTER — Encounter: Payer: Self-pay | Admitting: Obstetrics & Gynecology

## 2022-07-14 ENCOUNTER — Inpatient Hospital Stay: Payer: Commercial Managed Care - HMO | Attending: Obstetrics & Gynecology | Admitting: Obstetrics & Gynecology

## 2022-07-14 ENCOUNTER — Ambulatory Visit: Payer: Self-pay | Admitting: Obstetrics & Gynecology

## 2022-07-14 ENCOUNTER — Encounter: Payer: Self-pay | Admitting: Obstetrics & Gynecology

## 2022-07-14 ENCOUNTER — Other Ambulatory Visit: Payer: Self-pay

## 2022-07-14 VITALS — BP 135/85 | HR 70 | Temp 99.3°F | Resp 18 | Wt 236.0 lb

## 2022-07-14 DIAGNOSIS — Z8541 Personal history of malignant neoplasm of cervix uteri: Secondary | ICD-10-CM | POA: Diagnosis not present

## 2022-07-14 DIAGNOSIS — Z923 Personal history of irradiation: Secondary | ICD-10-CM | POA: Insufficient documentation

## 2022-07-14 DIAGNOSIS — Z9221 Personal history of antineoplastic chemotherapy: Secondary | ICD-10-CM | POA: Diagnosis not present

## 2022-07-14 DIAGNOSIS — C538 Malignant neoplasm of overlapping sites of cervix uteri: Secondary | ICD-10-CM

## 2022-07-14 NOTE — Progress Notes (Signed)
Follow Up Note: Gyn-Onc  Emma Medina 51 y.o. female  CC: She presents for a f/u visit   HPI: The oncology history was reviewed.  Interval History: The histology from a cervical biopsy in 5/22 was benign.  She was seen in f/u by Dr. Sondra Come in 1/23 and was felt to be clinically NED. She denies any vaginal bleeding, abdominal/pelvic pain, leg pain, urinary symptoms, cough or weight loss.  New onset nausea qod x 1 mth.    Review of Systems  Review of Systems  Constitutional:  Negative for malaise/fatigue and weight loss.  Respiratory:  Negative for shortness of breath and wheezing.   Cardiovascular:  Negative for chest pain and leg swelling.  Gastrointestinal:  Negative for abdominal pain, blood in stool, constipation, vomiting; Positive for nausea  Genitourinary:  Negative for dysuria, frequency, hematuria and urgency.  Musculoskeletal:  Negative for joint pain and myalgias.  Neurological:  Negative for weakness.  Psychiatric/Behavioral:  Negative for depression. The patient does not have insomnia.    Current medications, allergy, social history, past surgical history, past medical history, family history were all reviewed.    Vitals:  BP 135/85 (BP Location: Right Arm, Patient Position: Sitting)   Pulse 70   Temp 99.3 F (37.4 C) (Oral)   Resp 18   Wt 236 lb (107 kg)   LMP 06/13/2019 Comment: radiation tx cervical ca  SpO2 100%   BMI 36.96 kg/m    Physical Exam:  Physical Exam Exam conducted with a chaperone present.  Constitutional:      General: She is not in acute distress. Cardiovascular:     Rate and Rhythm: Normal rate and regular rhythm.  Pulmonary:     Effort: Pulmonary effort is normal.     Breath sounds: Normal breath sounds. No wheezing or rhonchi.  Abdominal:     Palpations: Abdomen is soft.     Tenderness: There is no abdominal tenderness. There is no right CVA tenderness or left CVA tenderness.     Hernia: No hernia is present.  Genitourinary:     General: Normal vulva.     Urethra: No urethral lesion.     Vagina: No lesions. No bleeding Musculoskeletal:     Cervical back: Neck supple.     Right lower leg: No edema.     Left lower leg: No edema.  Lymphadenopathy:     Upper Body:     Right upper body: No supraclavicular adenopathy.     Left upper body: No supraclavicular adenopathy.     Lower Body: No right inguinal adenopathy. No left inguinal adenopathy.  Skin:    Findings: No rash.  Neurological:     Mental Status: She is oriented to person, place, and time.   Assessment/Plan:  Malignant neoplasm of overlapping sites of cervix [facility]) [name]  is a 51 y.o.  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  New onset nausea--no emesis, weight loss.  Normal exam.     Continue annual Pap testing Keep a symptom diary Return prn or in 3 mos    I personally spent 25 minutes face-to-face and non-face-to-face in the care of this patient, which includes all pre, intra, and post visit time on the date of service.     Lahoma Crocker, MD

## 2022-07-14 NOTE — Patient Instructions (Signed)
Return in 3 months.

## 2022-07-14 NOTE — Assessment & Plan Note (Addendum)
[name]  is a 51 y.o.  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  New onset nausea--no emesis, weight loss.  Normal exam.    Continue annual Pap testing Keep a symptom diary Return prn or in 3 mos

## 2022-07-26 LAB — CYTOLOGY - PAP
Adequacy: ABSENT
Comment: NEGATIVE
Diagnosis: NEGATIVE
Diagnosis: REACTIVE
High risk HPV: NEGATIVE

## 2022-08-12 ENCOUNTER — Other Ambulatory Visit: Payer: Self-pay | Admitting: Physician Assistant

## 2022-08-12 DIAGNOSIS — I779 Disorder of arteries and arterioles, unspecified: Secondary | ICD-10-CM

## 2022-08-12 DIAGNOSIS — I998 Other disorder of circulatory system: Secondary | ICD-10-CM

## 2022-08-18 ENCOUNTER — Telehealth: Payer: Self-pay

## 2022-08-18 NOTE — Telephone Encounter (Signed)
Pt called to ask if we had samples of Xarelto. She was given the financial assistance program number at [place] and will call them. She will call us back if she needs further assistance.

## 2022-08-30 ENCOUNTER — Encounter (HOSPITAL_COMMUNITY): Payer: Self-pay

## 2022-08-30 ENCOUNTER — Ambulatory Visit (HOSPITAL_COMMUNITY)
Admission: EM | Admit: 2022-08-30 | Discharge: 2022-08-30 | Disposition: A | Discharge status: Home / Self Care | Payer: Commercial Managed Care - HMO | Attending: Family Medicine | Admitting: Family Medicine

## 2022-08-30 DIAGNOSIS — R509 Fever, unspecified: Secondary | ICD-10-CM | POA: Insufficient documentation

## 2022-08-30 DIAGNOSIS — H9209 Otalgia, unspecified ear: Secondary | ICD-10-CM | POA: Insufficient documentation

## 2022-08-30 DIAGNOSIS — J029 Acute pharyngitis, unspecified: Secondary | ICD-10-CM | POA: Insufficient documentation

## 2022-08-30 DIAGNOSIS — Z1152 Encounter for screening for COVID-19: Secondary | ICD-10-CM | POA: Insufficient documentation

## 2022-08-30 DIAGNOSIS — B349 Viral infection, unspecified: Secondary | ICD-10-CM | POA: Diagnosis not present

## 2022-08-30 DIAGNOSIS — R519 Headache, unspecified: Secondary | ICD-10-CM | POA: Insufficient documentation

## 2022-08-30 LAB — POCT INFECTIOUS MONO SCREEN, ED / UC: Mono Screen: NEGATIVE

## 2022-08-30 LAB — POCT RAPID STREP A, ED / UC: Streptococcus, Group A Screen (Direct): NEGATIVE

## 2022-08-30 LAB — RESP PANEL BY RT-PCR (FLU A&B, COVID) ARPGX2
Influenza A by PCR: NEGATIVE
Influenza B by PCR: NEGATIVE
SARS Coronavirus 2 by RT PCR: NEGATIVE

## 2022-08-30 MED ORDER — HYDROCODONE-ACETAMINOPHEN 5-325 MG PO TABS: 1.0000 | ORAL_TABLET | Freq: Four times a day (QID) | ORAL | 0 refills | Status: DC | PRN

## 2022-08-30 MED ORDER — ACETAMINOPHEN 325 MG PO TABS
650.0000 mg | ORAL_TABLET | Freq: Once | ORAL | Status: AC
  Administered 2022-08-30: 650 mg via ORAL

## 2022-08-30 MED ORDER — ACETAMINOPHEN 325 MG PO TABS
ORAL_TABLET | ORAL | Status: AC
  Filled 2022-08-30: qty 2

## 2022-08-30 MED ORDER — LIDOCAINE VISCOUS HCL 2 % MT SOLN: 5.0000 mL | Freq: Four times a day (QID) | OROMUCOSAL | 0 refills | Status: DC | PRN

## 2022-08-30 NOTE — ED Provider Notes (Addendum)
[place]    CSN: [id number] Arrival date & time: 08/30/22  1105      History   Chief Complaint Chief Complaint  Patient presents with   Sore Throat   Fever    HPI Emma Medina is a 51 y.o. female.    Sore Throat  Fever  Here for sore throat, headache, and ear pain.  Symptoms began yesterday.  She has thrown up twice today.  No diarrhea.  No cough and no rhinorrhea Last menstrual cycle was in 2019   Past Medical History:  Diagnosis Date   Cancer [facility]    cervical   Cervical cancer ([place])    DVT (deep vein thrombosis) in pregnancy    History of radiation therapy 08/10/2019   cervix 07/09/2019-08/10/2019  Dr Gery Pray   History of radiation therapy 09/11/2019   vaginal brachytherapy 08/14/2019-09/11/2019  Dr Gery Pray   Hypertension    Obstructive thrombus 2019   left leg   Sciatica    back    Patient Active Problem List   Diagnosis Date Noted   Inflammation of lung 12/12/2019   Screening breast examination 10/16/2019   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   Dysuria 08/13/2019   Hypomagnesemia 08/06/2019   Abdominal pain 07/30/2019   Tinnitus, subjective, bilateral 07/23/2019   Hypokalemia due to excessive gastrointestinal loss of potassium 07/16/2019   Diarrhea 07/16/2019   Peripheral vascular disease of lower extremity ([place]) 06/28/2019   Malignant neoplasm of overlapping sites of cervix ([place]) 06/01/2019   Obesity (BMI 35.0-39.9 without comorbidity) 06/01/2019   At risk for hemorrhage associated with anticoagulation therapy 06/01/2019   Morbid obesity ([place]) 04/26/2019   Fibroids 04/26/2019   Obstructive thrombus 05/18/2018   Ischemia of extremity 05/18/2018    Past Surgical History:  Procedure Laterality Date   ABDOMINAL AORTOGRAM N/A 05/18/2018   Procedure: ABDOMINAL AORTOGRAM;  Surgeon: Waynetta Sandy, MD;  Location: [place] [facility];  Service: Cardiovascular;  Laterality: N/A;   ABDOMINAL  AORTOGRAM W/LOWER EXTREMITY Left 10/26/2018   Procedure: ABDOMINAL AORTOGRAM W/LOWER EXTREMITY;  Surgeon: Waynetta Sandy, MD;  Location: [place] [facility];  Service: Cardiovascular;  Laterality: Left;   IR IMAGING GUIDED PORT INSERTION  07/05/2019   IR REMOVAL TUN ACCESS W/ PORT W/O FL MOD SED  12/25/2019   LOWER EXTREMITY ANGIOGRAPHY Bilateral 05/18/2018   Procedure: LOWER EXTREMITY ANGIOGRAPHY;  Surgeon: Waynetta Sandy, MD;  Location: [place] [facility];  Service: Cardiovascular;  Laterality: Bilateral;   OPERATIVE ULTRASOUND N/A 08/14/2019   Procedure: OPERATIVE ULTRASOUND;  Surgeon: Gery Pray, MD;  Location: [facility];  Service: Urology;  Laterality: N/A;   OPERATIVE ULTRASOUND N/A 08/20/2019   Procedure: OPERATIVE ULTRASOUND;  Surgeon: Gery Pray, MD;  Location: [facility];  Service: Urology;  Laterality: N/A;   OPERATIVE ULTRASOUND N/A 08/29/2019   Procedure: OPERATIVE ULTRASOUND;  Surgeon: Gery Pray, MD;  Location: [facility];  Service: Urology;  Laterality: N/A;   OPERATIVE ULTRASOUND N/A 09/06/2019   Procedure: OPERATIVE ULTRASOUND;  Surgeon: Gery Pray, MD;  Location: [facility];  Service: Urology;  Laterality: N/A;   OPERATIVE ULTRASOUND N/A 09/11/2019   Procedure: OPERATIVE ULTRASOUND;  Surgeon: Gery Pray, MD;  Location: [facility];  Service: Urology;  Laterality: N/A;   TANDEM RING INSERTION N/A 08/14/2019   Procedure: TANDEM RING INSERTION - FIRST;  Surgeon: Gery Pray, MD;  Location: [facility];  Service: Urology;  Laterality:  N/A;   TANDEM RING INSERTION N/A 08/20/2019   Procedure: TANDEM RING INSERTION;  Surgeon: Gery Pray, MD;  Location[facility];  Service: Urology;  Laterality: N/A;   TANDEM RING INSERTION N/A 08/29/2019   Procedure: TANDEM RING INSERTION;  Surgeon: Gery Pray, MD;  Location: [facility];   Service: Urology;  Laterality: N/A;   TANDEM RING INSERTION N/A 09/06/2019   Procedure: TANDEM RING INSERTION;  Surgeon: Gery Pray, MD;  Location: [facility];  Service: Urology;  Laterality: N/A;   TANDEM RING INSERTION N/A 09/11/2019   Procedure: TANDEM RING INSERTION;  Surgeon: Gery Pray, MD;  Location: [facility];  Service: Urology;  Laterality: N/A;   THROMBECTOMY FEMORAL ARTERY Left 05/18/2018   Procedure: LEFT LEG THROMBECTOMY, BALLOON ANGIOPLASTY LEFT POSTERIOR TIBIAL ARTERY;  Surgeon: Waynetta Sandy, MD;  Location: [facility];  Service: Vascular;  Laterality: Left;   TUBAL LIGATION      OB History     Gravida  8   Para  8   Term  8   Preterm      AB      Living  8      SAB      IAB      Ectopic      Multiple      Live Births  8            Home Medications    Prior to Admission medications   Medication Sig Start Date End Date Taking? Authorizing Provider  HYDROcodone-acetaminophen (NORCO/VICODIN) 5-325 MG tablet Take 1 tablet by mouth every 6 (six) hours as needed (pain). 08/30/22  Yes Barrett Henle, MD  magic mouthwash (lidocaine, diphenhydrAMINE, alum & mag hydroxide) suspension Swish and spit 5 mLs 4 (four) times daily as needed for mouth pain. 08/30/22  Yes Barrett Henle, MD  aspirin EC 81 MG EC tablet Take 1 tablet (81 mg total) by mouth daily. 05/23/18   Ulyses Amor, PA-C  cyclobenzaprine (FLEXERIL) 10 MG tablet Take 1 tablet (10 mg total) by mouth 2 (two) times daily as needed for muscle spasms. 11/30/19   Faustino Congress, NP  XARELTO 20 MG TABS tablet TAKE 1 TABLET BY MOUTH ONCE DAILY WITH SUPPER 08/16/22   Waynetta Sandy, MD    Family History Family History  Problem Relation Age of Onset   Cancer Mother        brain and uterine   Stroke Brother    Diabetes Brother    Clotting disorder Brother    Breast cancer Neg Hx     Social History Social History   Tobacco Use    Smoking status: Former    Packs/day: 1.00    Years: 15.00    Total pack years: 15.00    Types: Cigarettes    Quit date: 07/02/2016    Years since quitting: 6.1   Smokeless tobacco: Never  Vaping Use   Vaping Use: Never used  Substance Use Topics   Alcohol use: Yes    Comment: rare   Drug use: Yes    Frequency: 7.0 times per week    Types: Marijuana    Comment: smokes blunt on occasion last blunt 07-12-2019     Allergies   Patient has no known allergies.   Review of Systems Review of Systems  Constitutional:  Positive for fever.     Physical Exam Triage Vital Signs ED Triage Vitals [08/30/22 1316]  Enc Vitals Group  BP 130/81     Pulse Rate 78     Resp 18     Temp (!) 102.7 F (39.3 C)     Temp Source Oral     SpO2 98 %     Weight      Height      Head Circumference      Peak Flow      Pain Score 10     Pain Loc      Pain Edu?      Excl. in [place]?    No data found.  Updated Vital Signs BP 130/81 (BP Location: Right Arm)   Pulse 78   Temp (!) 102.7 F (39.3 C) (Oral)   Resp 18   LMP 06/13/2019 Comment: radiation tx cervical ca  SpO2 98%   Visual Acuity Right Eye Distance:   Left Eye Distance:   Bilateral Distance:    Right Eye Near:   Left Eye Near:    Bilateral Near:     Physical Exam Vitals reviewed.  Constitutional:      General: She is not in acute distress.    Appearance: She is not toxic-appearing.  HENT:     Left Ear: Tympanic membrane and ear canal normal.     Ears:     Comments: Tympanic membrane is obscured by cerumen    Nose: Nose normal.     Mouth/Throat:     Mouth: Mucous membranes are moist.     Comments: There is erythema of the tonsils and there 2+ hypertrophy. Eyes:     Extraocular Movements: Extraocular movements intact.     Conjunctiva/sclera: Conjunctivae normal.     Pupils: Pupils are equal, round, and reactive to light.  Cardiovascular:     Rate and Rhythm: Normal rate and regular rhythm.     Heart sounds:  No murmur heard. Pulmonary:     Effort: No respiratory distress.     Breath sounds: No stridor. No wheezing, rhonchi or rales.  Chest:     Chest wall: No tenderness.  Musculoskeletal:     Cervical back: Neck supple.  Lymphadenopathy:     Cervical: No cervical adenopathy.  Skin:    Capillary Refill: Capillary refill takes less than 2 seconds.     Coloration: Skin is not jaundiced or pale.  Neurological:     General: No focal deficit present.     Mental Status: She is alert and oriented to person, place, and time.  Psychiatric:        Behavior: Behavior normal.      UC Treatments / Results  Labs (all labs ordered are listed, but only abnormal results are displayed) Labs Reviewed  CULTURE, GROUP A STREP ([place])  RESP PANEL BY RT-PCR (FLU A&B, COVID) ARPGX2  POCT RAPID STREP A, ED / UC  POCT INFECTIOUS MONO SCREEN, ED / UC    EKG   Radiology No results found.  Procedures Procedures (including critical care time)  Medications Ordered in UC Medications  acetaminophen (TYLENOL) tablet 650 mg (650 mg Oral Given 08/30/22 1322)    Initial Impression / Assessment and Plan / UC Course  I have reviewed the triage vital signs and the nursing notes.  Pertinent labs & imaging results that were available during my care of the patient were reviewed by me and considered in my medical decision making (see chart for details).        Strep is negative so we will culture the throat and treat per protocol if  positive  Mono is negative.  She is also swabbed for flu and COVID. If her COVID is positive, she is a candidate for Paxlovid as her last EGFR was greater than 60.  If her flu is positive she is a candidate for Tamiflu.  Sending hydrocodone in for pain relief as she is on Xarelto.  She states tramadol does not help. Final Clinical Impressions(s) / UC Diagnoses   Final diagnoses:  Acute pharyngitis, unspecified etiology  Viral illness     Discharge Instructions       Your strep test is negative.  Culture of the throat will be sent, and staff will notify you if that is in turn positive.  The monotest also was negative.  Hydrocodone 5 mg--1 tablet every 6 hours as needed for pain.  This is best taken with food.  It can cause sleepiness or dizziness  -Magic mouthwash-swish and spit or swish and swallow 5 mL every 6 hours as needed for throat pain   You have been swabbed for COVID and flu, and the test will result in the next 24 hours. Our staff will call you if positive. If the COVID test is positive, you should quarantine for 5 days from the start of your symptoms      ED Prescriptions     Medication Sig Dispense Auth. Provider   HYDROcodone-acetaminophen (NORCO/VICODIN) 5-325 MG tablet Take 1 tablet by mouth every 6 (six) hours as needed (pain). 12 tablet Barrett Henle, MD   magic mouthwash (lidocaine, diphenhydrAMINE, alum & mag hydroxide) suspension Swish and spit 5 mLs 4 (four) times daily as needed for mouth pain. 360 mL Barrett Henle, MD      I have reviewed the PDMP during this encounter.   Barrett Henle, MD 08/30/22 1431    Barrett Henle, MD 08/30/22 1459

## 2022-08-30 NOTE — Discharge Instructions (Signed)
Your strep test is negative.  Culture of the throat will be sent, and staff will notify you if that is in turn positive.  The monotest also was negative.  Hydrocodone 5 mg--1 tablet every 6 hours as needed for pain.  This is best taken with food.  It can cause sleepiness or dizziness  -Magic mouthwash-swish and spit or swish and swallow 5 mL every 6 hours as needed for throat pain   You have been swabbed for COVID and flu, and the test will result in the next 24 hours. Our staff will call you if positive. If the COVID test is positive, you should quarantine for 5 days from the start of your symptoms

## 2022-08-30 NOTE — ED Triage Notes (Signed)
Pt c/o sore throat, fever, and headache since last night. Denies taking anything for fever. States hx of strep and this feels the same.

## 2022-08-31 ENCOUNTER — Emergency Department (HOSPITAL_COMMUNITY)
Admission: EM | Admit: 2022-08-31 | Discharge: 2022-08-31 | Disposition: A | Discharge status: Home / Self Care | Payer: Commercial Managed Care - HMO | Attending: Emergency Medicine | Admitting: Emergency Medicine

## 2022-08-31 ENCOUNTER — Other Ambulatory Visit: Payer: Self-pay

## 2022-08-31 ENCOUNTER — Encounter (HOSPITAL_COMMUNITY): Payer: Self-pay | Admitting: Emergency Medicine

## 2022-08-31 DIAGNOSIS — Z7901 Long term (current) use of anticoagulants: Secondary | ICD-10-CM | POA: Diagnosis not present

## 2022-08-31 DIAGNOSIS — Z8541 Personal history of malignant neoplasm of cervix uteri: Secondary | ICD-10-CM | POA: Diagnosis not present

## 2022-08-31 DIAGNOSIS — Z7982 Long term (current) use of aspirin: Secondary | ICD-10-CM | POA: Diagnosis not present

## 2022-08-31 DIAGNOSIS — J020 Streptococcal pharyngitis: Secondary | ICD-10-CM | POA: Diagnosis not present

## 2022-08-31 DIAGNOSIS — J029 Acute pharyngitis, unspecified: Secondary | ICD-10-CM | POA: Diagnosis present

## 2022-08-31 DIAGNOSIS — I10 Essential (primary) hypertension: Secondary | ICD-10-CM | POA: Insufficient documentation

## 2022-08-31 DIAGNOSIS — Z1152 Encounter for screening for COVID-19: Secondary | ICD-10-CM | POA: Diagnosis not present

## 2022-08-31 DIAGNOSIS — R110 Nausea: Secondary | ICD-10-CM | POA: Insufficient documentation

## 2022-08-31 DIAGNOSIS — Z79899 Other long term (current) drug therapy: Secondary | ICD-10-CM | POA: Diagnosis not present

## 2022-08-31 LAB — GROUP A STREP BY PCR: Group A Strep by PCR: DETECTED — AB

## 2022-08-31 LAB — SARS CORONAVIRUS 2 BY RT PCR: SARS Coronavirus 2 by RT PCR: NEGATIVE

## 2022-08-31 MED ORDER — ACETAMINOPHEN 500 MG PO TABS
1000.0000 mg | ORAL_TABLET | Freq: Once | ORAL | Status: AC
  Administered 2022-08-31: 1000 mg via ORAL
  Filled 2022-08-31: qty 2

## 2022-08-31 MED ORDER — PREDNISONE 10 MG PO TABS: 20.0000 mg | ORAL_TABLET | Freq: Every day | ORAL | 0 refills | Status: DC

## 2022-08-31 MED ORDER — LIDOCAINE VISCOUS HCL 2 % MT SOLN: 5.0000 mL | Freq: Four times a day (QID) | OROMUCOSAL | 1 refills | Status: DC | PRN

## 2022-08-31 MED ORDER — AMOXICILLIN 500 MG PO CAPS: 500.0000 mg | ORAL_CAPSULE | Freq: Two times a day (BID) | ORAL | 0 refills | Status: DC

## 2022-08-31 MED ORDER — AMOXICILLIN-POT CLAVULANATE 875-125 MG PO TABS: 1.0000 | ORAL_TABLET | Freq: Two times a day (BID) | ORAL | 0 refills | Status: DC

## 2022-08-31 MED ORDER — PREDNISONE 10 MG PO TABS: 20.0000 mg | ORAL_TABLET | Freq: Every day | ORAL | 0 refills | Status: AC

## 2022-08-31 MED ORDER — ACETAMINOPHEN 500 MG PO TABS: 1000.0000 mg | ORAL_TABLET | Freq: Once | ORAL | Status: DC

## 2022-08-31 MED ORDER — AMOXICILLIN-POT CLAVULANATE 875-125 MG PO TABS: 1.0000 | ORAL_TABLET | Freq: Two times a day (BID) | ORAL | 0 refills | Status: AC

## 2022-08-31 NOTE — ED Provider Notes (Signed)
[facility] DEPT Provider Note   CSN: [id number] Arrival date & time: 08/31/22  1213     History  Chief Complaint  Patient presents with   Sore Throat    Emma Medina is a 51 y.o. female.   Sore Throat  51 year old female with past medical history significant for cervical cancer, DVT on Eliquis, hypertension, sciatica  She presented emergency room today with complaints of sore throat for 2 days.  She states she has had some mild nausea but no vomiting today.  No diarrhea no abdominal pain chest pain fevers lightheadedness or dizziness.  No cough or rhinorrhea.  She had a throat culture obtained yesterday which is still pending.     Home Medications Prior to Admission medications   Medication Sig Start Date End Date Taking? Authorizing Provider  amoxicillin-clavulanate (AUGMENTIN) 875-125 MG tablet Take 1 tablet by mouth every 12 (twelve) hours for 10 days. 08/31/22 09/10/22 Yes Parissa Chiao, Kathleene Hazel, PA  magic mouthwash (lidocaine, diphenhydrAMINE, alum & mag hydroxide) suspension Swish and swallow 5 mLs 4 (four) times daily as needed for mouth pain. 08/31/22  Yes Daysen Gundrum S, PA  predniSONE (DELTASONE) 10 MG tablet Take 2 tablets (20 mg total) by mouth daily for 4 days. 08/31/22 09/04/22 Yes Tedd Sias, PA  aspirin EC 81 MG EC tablet Take 1 tablet (81 mg total) by mouth daily. 05/23/18   Ulyses Amor, PA-C  cyclobenzaprine (FLEXERIL) 10 MG tablet Take 1 tablet (10 mg total) by mouth 2 (two) times daily as needed for muscle spasms. 11/30/19   Faustino Congress, NP  HYDROcodone-acetaminophen (NORCO/VICODIN) 5-325 MG tablet Take 1 tablet by mouth every 6 (six) hours as needed (pain). 08/30/22   Barrett Henle, MD  XARELTO 20 MG TABS tablet TAKE 1 TABLET BY MOUTH ONCE DAILY WITH SUPPER 08/16/22   Waynetta Sandy, MD      Allergies    Patient has no known allergies.    Review of Systems   Review of Systems  Physical  Exam Updated Vital Signs BP (!) 142/101 (BP Location: Left Arm)   Pulse (!) 103   Temp 99.6 F (37.6 C) (Oral)   Resp 18   Wt 69.3 kg   LMP 06/13/2019 Comment: radiation tx cervical ca  SpO2 94%   BMI 23.93 kg/m  Physical Exam Vitals and nursing note reviewed.  Constitutional:      General: She is not in acute distress.    Appearance: Normal appearance. She is not ill-appearing.  HENT:     Head: Normocephalic and atraumatic.     Mouth/Throat:     Comments: Posterior pharyngeal erythema, no trismus, normal phonation, uvula midline, right tonsil 2+ left tonsil 1+ tonsillar exudates on right tonsil. Eyes:     General: No scleral icterus.       Right eye: No discharge.        Left eye: No discharge.     Conjunctiva/sclera: Conjunctivae normal.  Pulmonary:     Effort: Pulmonary effort is normal.     Breath sounds: No stridor.  Abdominal:     Tenderness: There is no abdominal tenderness.  Neurological:     Mental Status: She is alert and oriented to person, place, and time. Mental status is at baseline.     ED Results / Procedures / Treatments   Labs (all labs ordered are listed, but only abnormal results are displayed) Labs Reviewed  GROUP A STREP BY PCR - Abnormal; Notable for the  following components:      Result Value   Group A Strep by PCR DETECTED (*)    All other components within normal limits  SARS CORONAVIRUS 2 BY RT PCR  MONONUCLEOSIS SCREEN    EKG None  Radiology No results found.  Procedures Procedures    Medications Ordered in ED Medications - No data to display  ED Course/ Medical Decision Making/ A&P                           Medical Decision Making Risk Prescription drug management.   51 year old female with past medical history significant for cervical cancer, DVT on Eliquis, hypertension, sciatica  She presented emergency room today with complaints of sore throat for 2 days.  She states she has had some mild nausea but no vomiting  today.  No diarrhea no abdominal pain chest pain fevers lightheadedness or dizziness.  No cough or rhinorrhea.  She had a throat culture obtained yesterday which is still pending.  No evidence of peritonsillar abscess right tonsil is somewhat more swollen however normal voice, and overall well-appearing managing secretions.  Will start on Augmentin and recommend PCP follow-up.  Return precautions discussed.  Strep test positive, COVID test negative.  Recommend Tylenol which she has not taken any today.  Also given low-dose prednisone for 4 days and Magic mouthwash for pain  Final Clinical Impression(s) / ED Diagnoses Final diagnoses:  Strep throat    Rx / DC Orders ED Discharge Orders          Ordered    amoxicillin (AMOXIL) 500 MG capsule  2 times daily,   Status:  Discontinued        08/31/22 1352    magic mouthwash (lidocaine, diphenhydrAMINE, alum & mag hydroxide) suspension  4 times daily PRN       Note to Pharmacy: Call [phone] for clarification if needed.   08/31/22 1352    predniSONE (DELTASONE) 10 MG tablet  Daily        08/31/22 1352    amoxicillin-clavulanate (AUGMENTIN) 875-125 MG tablet  Every 12 hours        08/31/22 1358              Tedd Sias, [place] 08/31/22 1359    Carmin Muskrat, MD 08/31/22 1644

## 2022-08-31 NOTE — ED Triage Notes (Signed)
Patient complaints of a sore throat with swelling. She says the swelling makes it hard for her to breath. She has been checked for step throat and flu, both negative.

## 2022-08-31 NOTE — Discharge Instructions (Addendum)
You have been diagnosed with strep throat and I am treating you with an antibiotic called amoxicillin.  1000 mg of Tylenol every 6 hours Make sure you are drinking plenty of water Gargle and swallow the mouthwash that I have prescribed you it is safe to swallow.  You can also gargle and spit it out if you would like.  I also recommend that you gargle and spit out warm salt water, use throat lozenges and follow-up with your primary care doctor return to the ER for any new or concerning symptoms.

## 2022-09-01 LAB — CULTURE, GROUP A STREP (THRC)

## 2022-10-25 ENCOUNTER — Ambulatory Visit: Payer: Self-pay | Admitting: Radiation Oncology

## 2022-11-24 ENCOUNTER — Other Ambulatory Visit: Payer: Self-pay | Admitting: Vascular Surgery

## 2022-11-24 DIAGNOSIS — I998 Other disorder of circulatory system: Secondary | ICD-10-CM

## 2022-11-24 DIAGNOSIS — I779 Disorder of arteries and arterioles, unspecified: Secondary | ICD-10-CM

## 2022-11-25 ENCOUNTER — Other Ambulatory Visit: Payer: Self-pay

## 2022-11-25 ENCOUNTER — Telehealth: Payer: Self-pay

## 2022-11-25 DIAGNOSIS — I779 Disorder of arteries and arterioles, unspecified: Secondary | ICD-10-CM

## 2022-11-25 NOTE — Telephone Encounter (Signed)
Received fax from pt's pharmacy for a refill on Xarelto. Upon review of the pt's chart, pt had a recall from 12/22 to have a 1 year f/u in 12/23.  Reviewed pt's chart, two identifiers used. Asked pt if she was completely out of Xarelto. She stated that the pharmacy just gave her the last refill for 30 day supply. Informed her of overdue recall and pt was happy to schedule. Appts scheduled from recall. Instructed pt to ask for refill at that time. Confirmed understanding.

## 2022-12-08 ENCOUNTER — Ambulatory Visit (HOSPITAL_COMMUNITY)
Admission: RE | Admit: 2022-12-08 | Discharge: 2022-12-08 | Disposition: A | Discharge status: Home / Self Care | Payer: Commercial Managed Care - HMO | Source: Ambulatory Visit | Attending: Vascular Surgery | Admitting: Vascular Surgery

## 2022-12-08 ENCOUNTER — Encounter: Payer: Self-pay | Admitting: Physician Assistant

## 2022-12-08 ENCOUNTER — Ambulatory Visit (INDEPENDENT_AMBULATORY_CARE_PROVIDER_SITE_OTHER): Payer: Self-pay | Admitting: Physician Assistant

## 2022-12-08 VITALS — BP 121/86 | HR 67 | Temp 97.7°F | Wt 239.0 lb

## 2022-12-08 DIAGNOSIS — I779 Disorder of arteries and arterioles, unspecified: Secondary | ICD-10-CM

## 2022-12-08 DIAGNOSIS — I998 Other disorder of circulatory system: Secondary | ICD-10-CM

## 2022-12-08 LAB — VAS US ABI WITH/WO TBI
Left ABI: 1.05
Right ABI: 1.1

## 2022-12-08 MED ORDER — RIVAROXABAN 20 MG PO TABS: 20.0000 mg | ORAL_TABLET | Freq: Every day | ORAL | 2 refills | Status: DC

## 2022-12-08 NOTE — Progress Notes (Signed)
Office Note     CC:  follow up Requesting Provider:  Marliss Coots, NP  HPI: Emma Medina is a 52 y.o. (Feb 14, 1971) female who presents for PAD follow up.  Her initial presentation in 2019 was for an occluded left lower extremity and she underwent thromboembolectomy on May 18, 2018 by Dr. Donzetta Matters.  She was placed on Xarelto. Her last intervention was an arteriogram on October 26, 2018 for complaints of pain and numbness in her left foot and a toe brachial index of 0.  No intervention was performed. She was noted to have primary outflow via AT with minimal flow into her foot.   She has not had any recurrent symptoms. Today she denies any claudication, rest pain or tissue loss. She does have some pain in her right foot in area of bunion. She also reports some mild swelling in her left ankle > right but this does not bother her. She is able to see her sock line at times and was concerned that she should be worried about this. She is compliant with her Xarelto  The pt is on a statin for cholesterol management.  The pt is on a daily aspirin.   Other AC:  Xarelto The pt is not on medication for hypertension.   The pt is not diabetic.   Tobacco hx:  Former  Past Medical History:  Diagnosis Date   Cancer ([place])    cervical   Cervical cancer ([place])    DVT (deep vein thrombosis) in pregnancy    History of radiation therapy 08/10/2019   cervix 07/09/2019-08/10/2019  Dr Gery Pray   History of radiation therapy 09/11/2019   vaginal brachytherapy 08/14/2019-09/11/2019  Dr Gery Pray   Hypertension    Obstructive thrombus 2019   left leg   Sciatica    back    Past Surgical History:  Procedure Laterality Date   ABDOMINAL AORTOGRAM N/A 05/18/2018   Procedure: ABDOMINAL AORTOGRAM;  Surgeon: Waynetta Sandy, MD;  Location: [facility];  Service: Cardiovascular;  Laterality: N/A;   ABDOMINAL AORTOGRAM W/LOWER EXTREMITY Left 10/26/2018   Procedure: ABDOMINAL AORTOGRAM W/LOWER  EXTREMITY;  Surgeon: Waynetta Sandy, MD;  Location: [facility];  Service: Cardiovascular;  Laterality: Left;   IR IMAGING GUIDED PORT INSERTION  07/05/2019   IR REMOVAL TUN ACCESS W/ PORT W/O FL MOD SED  12/25/2019   LOWER EXTREMITY ANGIOGRAPHY Bilateral 05/18/2018   Procedure: LOWER EXTREMITY ANGIOGRAPHY;  Surgeon: Waynetta Sandy, MD;  Location: [facility];  Service: Cardiovascular;  Laterality: Bilateral;   OPERATIVE ULTRASOUND N/A 08/14/2019   Procedure: OPERATIVE ULTRASOUND;  Surgeon: Gery Pray, MD;  Location: [facility];  Service: Urology;  Laterality: N/A;   OPERATIVE ULTRASOUND N/A 08/20/2019   Procedure: OPERATIVE ULTRASOUND;  Surgeon: Gery Pray, MD;  Location: [facility];  Service: Urology;  Laterality: N/A;   OPERATIVE ULTRASOUND N/A 08/29/2019   Procedure: OPERATIVE ULTRASOUND;  Surgeon: Gery Pray, MD;  Location: [facility];  Service: Urology;  Laterality: N/A;   OPERATIVE ULTRASOUND N/A 09/06/2019   Procedure: OPERATIVE ULTRASOUND;  Surgeon: Gery Pray, MD;  Location: [facility];  Service: Urology;  Laterality: N/A;   OPERATIVE ULTRASOUND N/A 09/11/2019   Procedure: OPERATIVE ULTRASOUND;  Surgeon: Gery Pray, MD;  Location: [facility];  Service: Urology;  Laterality: N/A;   TANDEM RING INSERTION N/A 08/14/2019   Procedure: TANDEM RING INSERTION - FIRST;  Surgeon: Gery Pray, MD;  Location: [facility];  Service: Urology;  Laterality: N/A;   TANDEM RING INSERTION N/A 08/20/2019   Procedure: TANDEM RING INSERTION;  Surgeon: Gery Pray, MD;  Location: [facility];  Service: Urology;  Laterality: N/A;   TANDEM RING INSERTION N/A 08/29/2019   Procedure: TANDEM RING INSERTION;  Surgeon: Gery Pray, MD;  Location: [facility];  Service: Urology;  Laterality: N/A;   TANDEM RING INSERTION N/A 09/06/2019    Procedure: TANDEM RING INSERTION;  Surgeon: Gery Pray, MD;  Location: [facility];  Service: Urology;  Laterality: N/A;   TANDEM RING INSERTION N/A 09/11/2019   Procedure: TANDEM RING INSERTION;  Surgeon: Gery Pray, MD;  Location: [facility];  Service: Urology;  Laterality: N/A;   THROMBECTOMY FEMORAL ARTERY Left 05/18/2018   Procedure: LEFT LEG THROMBECTOMY, BALLOON ANGIOPLASTY LEFT POSTERIOR TIBIAL ARTERY;  Surgeon: Waynetta Sandy, MD;  Location: [facility];  Service: Vascular;  Laterality: Left;   TUBAL LIGATION      Social History   Socioeconomic History   Marital status: Single    Spouse name: Not on file   Number of children: Not on file   Years of education: Not on file   Highest education level: 12th grade  Occupational History   Not on file  Tobacco Use   Smoking status: Former    Packs/day: 1.00    Years: 15.00    Total pack years: 15.00    Types: Cigarettes    Quit date: 07/02/2016    Years since quitting: 6.4   Smokeless tobacco: Never  Vaping Use   Vaping Use: Never used  Substance and Sexual Activity   Alcohol use: Yes    Comment: rare   Drug use: Yes    Frequency: 7.0 times per week    Types: Marijuana    Comment: smokes blunt on occasion last blunt 07-12-2019   Sexual activity: Yes    Birth control/protection: Surgical  Other Topics Concern   Not on file  Social History Narrative   Not on file   Social Determinants of Health   Financial Resource Strain: Not on file  Food Insecurity: Not on file  Transportation Needs: No Transportation Needs (10/16/2019)   PRAPARE - Hydrologist (Medical): No    Lack of Transportation (Non-Medical): No  Physical Activity: Not on file  Stress: Not on file  Social Connections: Not on file  Intimate Partner Violence: Not on file    Family History  Problem Relation Age of Onset   Cancer Mother        brain and uterine   Stroke Brother     Diabetes Brother    Clotting disorder Brother    Breast cancer Neg Hx     Current Outpatient Medications  Medication Sig Dispense Refill   aspirin EC 81 MG EC tablet Take 1 tablet (81 mg total) by mouth daily.     cyclobenzaprine (FLEXERIL) 10 MG tablet Take 1 tablet (10 mg total) by mouth 2 (two) times daily as needed for muscle spasms. 20 tablet 0   XARELTO 20 MG TABS tablet TAKE 1 TABLET BY MOUTH ONCE DAILY WITH SUPPER 90 tablet 0   No current facility-administered medications for this visit.    No Known Allergies   REVIEW OF SYSTEMS:  '[X]'$  denotes positive finding, '[ ]'$  denotes negative finding Cardiac  Comments:  Chest pain or chest pressure:    Shortness of breath upon exertion:  Short of breath when lying flat:    Irregular heart rhythm:        Vascular    Pain in calf, thigh, or hip brought on by ambulation:    Pain in feet at night that wakes you up from your sleep:     Blood clot in your veins:    Leg swelling:  X       Pulmonary    Oxygen at home:    Productive cough:     Wheezing:         Neurologic    Sudden weakness in arms or legs:     Sudden numbness in arms or legs:     Sudden onset of difficulty speaking or slurred speech:    Temporary loss of vision in one eye:     Problems with dizziness:         Gastrointestinal    Blood in stool:     Vomited blood:         Genitourinary    Burning when urinating:     Blood in urine:        Psychiatric    Major depression:         Hematologic    Bleeding problems:    Problems with blood clotting too easily:        Skin    Rashes or ulcers:        Constitutional    Fever or chills:      PHYSICAL EXAMINATION:  Vitals:   12/08/22 1325  BP: 121/86  Pulse: 67  Temp: 97.7 F (36.5 C)  TempSrc: Temporal  SpO2: 99%  Weight: 239 lb (108.4 kg)    General:  WDWN in NAD; vital signs documented above Gait: Normal HENT: WNL, normocephalic Pulmonary: normal non-labored breathing , without  wheezing Cardiac: regular HR, without  Murmurs without carotid bruit Abdomen: soft Vascular Exam/Pulses:  Right Left  Radial 2+ (normal) 2+ (normal)  Femoral 2+ (normal) 2+ (normal)  Popliteal 2+ (normal) 2+ (normal)  DP 1+ (weak) 1+ (weak)  PT absent absent   Extremities: without ischemic changes, without Gangrene , without cellulitis; without open wounds;  Musculoskeletal: no muscle wasting or atrophy  Neurologic: A&O X 3;  No focal weakness or paresthesias are detected Psychiatric:  The pt has Normal affect.   Non-Invasive Vascular Imaging:   +-------+-----------+-----------+------------+------------+  ABI/TBIToday's ABIToday's TBIPrevious ABIPrevious TBI  +-------+-----------+-----------+------------+------------+  Right 1.10       0.88       1.23        0.75          +-------+-----------+-----------+------------+------------+  Left  1.05       0.76       1.18        0             +-------+-----------+-----------+------------+------------+    ASSESSMENT/PLAN:: 52 y.o. female here for follow up for PAD. Her initial presentation in 2019 was for an occluded left lower extremity and she underwent thromboembolectomy on May 18, 2018 by Dr. Donzetta Matters. ABIs today are stable bilaterally - She remains without claudication symptoms, rest pain or tissue loss.  - Continue Xarelto  - She will follow up in 1 year with ABI   Karoline Caldwell, PA-C [facility] [phone]  Clinic MD:   [place]/ Donzetta Matters

## 2022-12-13 ENCOUNTER — Ambulatory Visit (INDEPENDENT_AMBULATORY_CARE_PROVIDER_SITE_OTHER): Payer: Self-pay

## 2022-12-13 ENCOUNTER — Ambulatory Visit
Admission: EM | Admit: 2022-12-13 | Discharge: 2022-12-13 | Disposition: A | Discharge status: Home / Self Care | Payer: Self-pay | Attending: Internal Medicine | Admitting: Internal Medicine

## 2022-12-13 DIAGNOSIS — M7711 Lateral epicondylitis, right elbow: Secondary | ICD-10-CM

## 2022-12-13 DIAGNOSIS — M79645 Pain in left finger(s): Secondary | ICD-10-CM

## 2022-12-13 DIAGNOSIS — M79642 Pain in left hand: Secondary | ICD-10-CM
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
## 2022-12-13 NOTE — Discharge Instructions (Addendum)
Your x-ray of your left hand is negative for fracture or dislocation. This is great news!  Please continue to use Tylenol as needed for pain. Please apply ice to the affected fingers of the left hand and elevate your left hand to reduce swelling.  Your elbow pain is due to tennis elbow which is caused by inflammation to the right elbow. I have included information on tennis elbow in your packet. Please purchase a tennis elbow brace online and wear this as directed to provide compression and support to the tendons of the elbow.   Schedule follow-up appointment with a hand specialist listed on your paperwork for ongoing evaluation and management of your left pinky pain.  If you develop any new or worsening symptoms or do not improve in the next 2 to 3 days, please return.  If your symptoms are severe, please go to the emergency room.  Follow-up with your primary care provider for further evaluation and management of your symptoms as well as ongoing wellness visits.  I hope you feel better!

## 2022-12-28 ENCOUNTER — Telehealth: Payer: Self-pay

## 2022-12-28 NOTE — Telephone Encounter (Signed)
Telephoned patient, returning her call. Voice mail unavailable at this time, [facility] unable to leave a message.

## 2023-01-24 ENCOUNTER — Encounter: Payer: Self-pay | Admitting: Radiation Oncology

## 2023-01-24 ENCOUNTER — Ambulatory Visit
Admission: RE | Admit: 2023-01-24 | Discharge: 2023-01-24 | Disposition: A | Discharge status: Home / Self Care | Payer: Self-pay | Source: Ambulatory Visit | Attending: Radiation Oncology | Admitting: Radiation Oncology

## 2023-01-24 VITALS — BP 121/83 | HR 81 | Temp 97.5°F | Resp 20 | Ht 67.0 in | Wt 240.8 lb

## 2023-01-24 DIAGNOSIS — Z923 Personal history of irradiation: Secondary | ICD-10-CM | POA: Insufficient documentation

## 2023-01-24 DIAGNOSIS — Z7982 Long term (current) use of aspirin: Secondary | ICD-10-CM | POA: Insufficient documentation

## 2023-01-24 DIAGNOSIS — K5900 Constipation, unspecified: Secondary | ICD-10-CM | POA: Insufficient documentation

## 2023-01-24 DIAGNOSIS — Z8541 Personal history of malignant neoplasm of cervix uteri: Secondary | ICD-10-CM | POA: Insufficient documentation

## 2023-01-24 DIAGNOSIS — Z7901 Long term (current) use of anticoagulants: Secondary | ICD-10-CM | POA: Insufficient documentation

## 2023-01-24 DIAGNOSIS — C538 Malignant neoplasm of overlapping sites of cervix uteri: Secondary | ICD-10-CM

## 2023-01-24 DIAGNOSIS — R110 Nausea: Secondary | ICD-10-CM | POA: Insufficient documentation

## 2023-01-24 DIAGNOSIS — R197 Diarrhea, unspecified: Secondary | ICD-10-CM | POA: Insufficient documentation

## 2023-01-24 MED ORDER — ONDANSETRON HCL 4 MG PO TABS: 4.0000 mg | ORAL_TABLET | Freq: Three times a day (TID) | ORAL | 1 refills | Status: DC | PRN

## 2023-01-24 NOTE — Progress Notes (Signed)
Radiation Oncology         ([phone]) [phone] ________________________________  Name: Emma Medina MRN: [id number]  Date: 01/24/2023  DOB: 1971-05-29  Follow-Up Visit Note  CC: Placey, Chales Abrahams, NP  Placey, Chales Abrahams, NP    ICD-10-CM   1. 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 She was last seen here for follow-up on 10/19/21. Since her last visit, the patient followed up with Dr. Tamela Oddi on 07/14/22. During which time, the patient denied any symptoms concerning for disease recurrence and she was noted as NED on examination. She did endorse some recent nausea which she was advised to keep track of.  Pap smear at that time showed no concerning findings.  The patient has had quite a few minor ED encounters since she was last seen. Her visit's were respectively related to degenerative arthritis, acute pharyngitis, strep throat, and a hand injury due to an incident.These were all treated / managed accordingly.     On evaluation today the patient reports some ongoing issues with nausea.  She also reports episodes of abdominal bloating and discomfort some associated pain.  She reports constipation alternating with diarrhea.  She is concerned about her ongoing issues with nausea.  Any vaginal bleeding or discharge.  She denies any hematuria.  She had 1 episode of what she felt was blood in the stool after a bowel movement.                             Allergies:  has No Known Allergies.  Meds: Current Outpatient Medications  Medication Sig Dispense Refill   aspirin EC 81 MG EC tablet Take 1 tablet (81 mg total) by mouth daily.     cyclobenzaprine (FLEXERIL) 10 MG tablet Take 1 tablet (10 mg total) by mouth 2 (two) times daily as needed for muscle spasms. 20 tablet 0   ondansetron (ZOFRAN) 4 MG tablet Take 1 tablet (4 mg total) by mouth every 8 (eight) hours as needed for nausea or vomiting. 20 tablet 1   rivaroxaban (XARELTO) 20 MG TABS tablet Take 1 tablet (20 mg total) by mouth daily with supper. 90 tablet 2   No current facility-administered medications for this encounter.    Physical Findings: The patient is in no acute distress. Patient is alert and oriented.  height is  (1.702 m) and weight is 240 lb 12.8 oz (109.2 kg). Her temperature is 97.5 F (36.4 C) (abnormal). Her blood pressure is 121/83 and her pulse is 81. Her respiration is 20 and oxygen saturation is 99%. .  Lungs are clear to auscultation bilaterally. Heart has regular rate and rhythm. No palpable cervical, supraclavicular, or axillary adenopathy. Abdomen soft, non-tender, normal bowel sounds.  On pelvic examination the external genitalia were unremarkable. A speculum exam was performed. There are no mucosal lesions noted in the vaginal vault.  Radiation changes noted in the proximal vagina.  The cervical os is visible as well as the anterior lip of the cervix.  On bimanual and rectovaginal examination there were no pelvic masses appreciated.  No nodularity along the cervical region.   Lab Findings: Lab Results  Component Value Date   WBC 4.6 12/25/2019   HGB 13.1 12/25/2019   HCT 40.9 12/25/2019   MCV 86.3 12/25/2019   PLT 288 12/25/2019    Radiographic Findings: No results found.  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  Abdominal bloating and episodes of nausea is  somewhat concerning.  Will proceed with imaging studies abdomen and pelvic CT scan.  Will also check some blood work.  Plan: Assuming workup is negative then the patient will return for follow-up in 1 year and will see Dr. Jean Rosenthal Christell Constant in 6 months.   28 minutes of total time was spent for this patient encounter, including preparation, face-to-face counseling with the patient and coordination of care, physical exam, and documentation of the encounter. ____________________________________  Billie Lade, PhD, MD  This document serves as a record of services personally performed by Antony Blackbird, MD. It was created on his behalf by Neena Rhymes, a trained medical scribe. The creation of this record is based on the scribe's personal observations and the provider's statements to them. This document has been checked and approved by the attending provider.

## 2023-01-24 NOTE — Progress Notes (Signed)
Emma Medina is here today for follow up post radiation to the pelvic.  They completed their radiation on: 09/11/19    Does the patient complain of any of the following:  Pain:No  Abdominal bloating: No Diarrhea/Constipation: Intermittent diarrhea.  Nausea/Vomiting: Nausea at all times.  Vaginal Discharge: No Blood in Urine or Stool: Yes, blood in stool. Patient reports noticing blood after having a loose stool.  Urinary Issues (dysuria/incomplete emptying/ incontinence/ increased frequency/urgency): Yes frequency.  Does patient report using vaginal dilator 2-3 times a week and/or sexually active 2-3 weeks: Yes sexually active.  Post radiation skin changes: No   Additional comments if applicable:  BP 121/83 (BP Location: Right Arm, Patient Position: Sitting, Cuff Size: Large)   Pulse 81   Temp (!) 97.5 F (36.4 C)   Resp 20   Ht  (1.702 m)   Wt 240 lb 12.8 oz (109.2 kg)   LMP 06/13/2019 Comment: radiation tx cervical ca  SpO2 99%   BMI 37.71 kg/m

## 2023-02-16 ENCOUNTER — Ambulatory Visit (HOSPITAL_COMMUNITY)
Admission: RE | Admit: 2023-02-16 | Discharge: 2023-02-16 | Disposition: A | Discharge status: Home / Self Care | Payer: Self-pay | Source: Ambulatory Visit | Attending: Radiation Oncology | Admitting: Radiation Oncology

## 2023-02-16 DIAGNOSIS — C538 Malignant neoplasm of overlapping sites of cervix uteri: Secondary | ICD-10-CM | POA: Insufficient documentation

## 2023-02-16 MED ORDER — SODIUM CHLORIDE (PF) 0.9 % IJ SOLN
INTRAMUSCULAR | Status: AC
  Filled 2023-02-16: qty 50

## 2023-02-16 MED ORDER — IOHEXOL 300 MG/ML  SOLN
100.0000 mL | Freq: Once | INTRAMUSCULAR | Status: AC | PRN
  Administered 2023-02-16: 100 mL via INTRAVENOUS

## 2023-02-21 ENCOUNTER — Telehealth: Payer: Self-pay

## 2023-02-21 NOTE — Telephone Encounter (Signed)
Called to make patient aware of good CT scan results per Dr. Roselind Messier. Patient voiced understanding.

## 2023-05-20 ENCOUNTER — Telehealth: Payer: Self-pay | Admitting: *Deleted

## 2023-05-20 NOTE — Telephone Encounter (Signed)
Called patient to inform of fu appt. with Dr. Antionette Char- on 07-13-23- arrival time- 1:15 pm, unable to leave message due to vm not being set up, mailed appt. card

## 2023-07-11 DIAGNOSIS — G8929 Other chronic pain: Secondary | ICD-10-CM | POA: Insufficient documentation

## 2023-07-11 DIAGNOSIS — M5137 Other intervertebral disc degeneration, lumbosacral region with discogenic back pain only: Secondary | ICD-10-CM | POA: Insufficient documentation

## 2023-07-13 ENCOUNTER — Other Ambulatory Visit (HOSPITAL_COMMUNITY)
Admission: RE | Admit: 2023-07-13 | Discharge: 2023-07-13 | Disposition: A | Discharge status: Home / Self Care | Payer: 59 | Source: Ambulatory Visit

## 2023-07-13 ENCOUNTER — Encounter: Payer: Self-pay | Admitting: Obstetrics & Gynecology

## 2023-07-13 ENCOUNTER — Inpatient Hospital Stay: Payer: 59 | Attending: Obstetrics & Gynecology | Admitting: Obstetrics & Gynecology

## 2023-07-13 VITALS — BP 133/81 | HR 83 | Temp 98.4°F | Resp 18 | Wt 243.0 lb

## 2023-07-13 DIAGNOSIS — R110 Nausea: Secondary | ICD-10-CM | POA: Diagnosis not present

## 2023-07-13 DIAGNOSIS — Z9221 Personal history of antineoplastic chemotherapy: Secondary | ICD-10-CM | POA: Insufficient documentation

## 2023-07-13 DIAGNOSIS — Z8541 Personal history of malignant neoplasm of cervix uteri: Secondary | ICD-10-CM | POA: Diagnosis not present

## 2023-07-13 DIAGNOSIS — C538 Malignant neoplasm of overlapping sites of cervix uteri: Secondary | ICD-10-CM | POA: Insufficient documentation

## 2023-07-13 DIAGNOSIS — Z923 Personal history of irradiation: Secondary | ICD-10-CM | POA: Insufficient documentation

## 2023-07-13 NOTE — Assessment & Plan Note (Addendum)
[name]  is a 52 y.o.  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  New onset nausea--no emesis, weight loss.  Normal exam.     Continue annual Pap testing Keep a symptom diary Return prn or in 1 yr; f/u w/Dr. Roselind Messier in 6 mos

## 2023-07-13 NOTE — Progress Notes (Signed)
Follow Up Note: Gyn-Onc  Emma Medina 52 y.o. female  CC: She presents for a f/u visit   HPI: The oncology history was reviewed.  Interval History:   She was seen in f/u by Dr. Roselind Medina in 4/24 and was felt to be clinically NED. She denies any vaginal bleeding, abdominal/pelvic pain, leg pain, urinary symptoms, cough or weight loss.  New onset nausea qod x 1 mth.   A Pap in 10/23 showed NILM/HR-HPV neg.   Review of Systems  Review of Systems  Constitutional:  Negative for malaise/fatigue and weight loss.  Respiratory:  Negative for shortness of breath and wheezing.   Cardiovascular:  Negative for chest pain and leg swelling.  Gastrointestinal:  Negative for abdominal pain, blood in stool, constipation, vomiting; Positive for nausea  Genitourinary:  Negative for dysuria, frequency, hematuria and urgency.  Musculoskeletal:  Negative for joint pain and myalgias.  Neurological:  Negative for weakness.  Psychiatric/Behavioral:  Negative for depression. The patient does not have insomnia.    Current medications, allergy, social history, past surgical history, past medical history, family history were all reviewed.    Vitals:  BP 133/81 (BP Location: Left Arm, Patient Position: Sitting)   Pulse 83   Temp 98.4 F (36.9 C) (Oral)   Resp 18   Wt 243 lb (110.2 kg)   LMP 06/13/2019 Comment: radiation tx cervical ca  SpO2 100%   BMI 38.06 kg/m    Physical Exam:  Physical Exam Exam conducted with a chaperone present.  Constitutional:      General: She is not in acute distress. Cardiovascular:     Rate and Rhythm: Normal rate and regular rhythm.  Pulmonary:     Effort: Pulmonary effort is normal.     Breath sounds: Normal breath sounds. No wheezing or rhonchi.  Abdominal:     Palpations: Abdomen is soft.     Tenderness: There is no abdominal tenderness. There is no right CVA tenderness or left CVA tenderness.     Hernia: No hernia is present.  Genitourinary:    General: Normal  vulva.     Urethra: No urethral lesion.     Vagina: No lesions. No bleeding  Cevix: anterior lip visible--no palpable abnormality Musculoskeletal:     Cervical back: Neck supple.     Right lower leg: No edema.     Left lower leg: No edema.  Lymphadenopathy:     Upper Body:     Right upper body: No supraclavicular adenopathy.     Left upper body: No supraclavicular adenopathy.     Lower Body: No right inguinal adenopathy. No left inguinal adenopathy.  Skin:    Findings: No rash.  Neurological:     Mental Status: She is oriented to person, place, and time.   Assessment/Plan:  Malignant neoplasm of overlapping sites of cervix [facility]) [name]  is a 52 y.o.  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  New onset nausea--no emesis, weight loss.  Normal exam.     Continue annual Pap testing Keep a symptom diary Return prn or in 1 yr; f/u w/Dr. Roselind Medina in 6 mos     I personally spent 25 minutes face-to-face and non-face-to-face in the care of this patient, which includes all pre, intra, and post visit time on the date of service.     Antionette Char, MD

## 2023-07-13 NOTE — Patient Instructions (Signed)
Return in 1 year ?

## 2023-07-21 LAB — CYTOLOGY - PAP: Diagnosis: NEGATIVE

## 2023-07-28 ENCOUNTER — Telehealth: Payer: Self-pay | Admitting: *Deleted

## 2023-07-28 NOTE — Telephone Encounter (Signed)
Spoke with Emma Medina regarding her pap smear from 07/13/23. No cancer or precancer was seen. Pt has no questions or concerns at this time.

## 2023-08-24 NOTE — Telephone Encounter (Signed)
Telephone call  

## 2023-08-28 DIAGNOSIS — R531 Weakness: Secondary | ICD-10-CM | POA: Insufficient documentation

## 2023-08-28 DIAGNOSIS — R293 Abnormal posture: Secondary | ICD-10-CM | POA: Insufficient documentation

## 2023-08-28 DIAGNOSIS — M5386 Other specified dorsopathies, lumbar region: Secondary | ICD-10-CM | POA: Insufficient documentation

## 2023-08-28 DIAGNOSIS — M5384 Other specified dorsopathies, thoracic region: Secondary | ICD-10-CM | POA: Insufficient documentation

## 2024-01-03 HISTORY — PX: CHOLECYSTECTOMY: SHX55

## 2024-01-06 ENCOUNTER — Other Ambulatory Visit: Payer: Self-pay | Admitting: *Deleted

## 2024-01-06 DIAGNOSIS — I779 Disorder of arteries and arterioles, unspecified: Secondary | ICD-10-CM

## 2024-01-06 DIAGNOSIS — I998 Other disorder of circulatory system: Secondary | ICD-10-CM

## 2024-01-06 MED ORDER — RIVAROXABAN 20 MG PO TABS: 20.0000 mg | ORAL_TABLET | Freq: Every day | ORAL | 2 refills | Status: DC

## 2024-01-15 ENCOUNTER — Emergency Department (HOSPITAL_COMMUNITY)

## 2024-01-15 ENCOUNTER — Other Ambulatory Visit: Payer: Self-pay

## 2024-01-15 ENCOUNTER — Inpatient Hospital Stay (HOSPITAL_COMMUNITY)
Admission: EM | Admit: 2024-01-15 | Discharge: 2024-01-19 | DRG: 418 | Disposition: A | Discharge status: Home / Self Care | Attending: Internal Medicine | Admitting: Internal Medicine

## 2024-01-15 ENCOUNTER — Encounter (HOSPITAL_COMMUNITY): Payer: Self-pay | Admitting: Emergency Medicine

## 2024-01-15 DIAGNOSIS — Z9189 Other specified personal risk factors, not elsewhere classified: Secondary | ICD-10-CM | POA: Diagnosis not present

## 2024-01-15 DIAGNOSIS — Z86718 Personal history of other venous thrombosis and embolism: Secondary | ICD-10-CM | POA: Diagnosis not present

## 2024-01-15 DIAGNOSIS — Z823 Family history of stroke: Secondary | ICD-10-CM

## 2024-01-15 DIAGNOSIS — E669 Obesity, unspecified: Secondary | ICD-10-CM | POA: Diagnosis present

## 2024-01-15 DIAGNOSIS — R911 Solitary pulmonary nodule: Secondary | ICD-10-CM | POA: Diagnosis present

## 2024-01-15 DIAGNOSIS — K838 Other specified diseases of biliary tract: Secondary | ICD-10-CM | POA: Diagnosis present

## 2024-01-15 DIAGNOSIS — K760 Fatty (change of) liver, not elsewhere classified: Secondary | ICD-10-CM | POA: Diagnosis present

## 2024-01-15 DIAGNOSIS — I10 Essential (primary) hypertension: Secondary | ICD-10-CM | POA: Diagnosis present

## 2024-01-15 DIAGNOSIS — Z87891 Personal history of nicotine dependence: Secondary | ICD-10-CM

## 2024-01-15 DIAGNOSIS — Z832 Family history of diseases of the blood and blood-forming organs and certain disorders involving the immune mechanism: Secondary | ICD-10-CM | POA: Diagnosis not present

## 2024-01-15 DIAGNOSIS — I2489 Other forms of acute ischemic heart disease: Secondary | ICD-10-CM | POA: Diagnosis present

## 2024-01-15 DIAGNOSIS — G8929 Other chronic pain: Secondary | ICD-10-CM | POA: Diagnosis present

## 2024-01-15 DIAGNOSIS — K8000 Calculus of gallbladder with acute cholecystitis without obstruction: Principal | ICD-10-CM | POA: Diagnosis present

## 2024-01-15 DIAGNOSIS — Z7982 Long term (current) use of aspirin: Secondary | ICD-10-CM

## 2024-01-15 DIAGNOSIS — K82A1 Gangrene of gallbladder in cholecystitis: Secondary | ICD-10-CM | POA: Diagnosis present

## 2024-01-15 DIAGNOSIS — R7989 Other specified abnormal findings of blood chemistry: Secondary | ICD-10-CM | POA: Diagnosis present

## 2024-01-15 DIAGNOSIS — Z7901 Long term (current) use of anticoagulants: Secondary | ICD-10-CM | POA: Diagnosis not present

## 2024-01-15 DIAGNOSIS — Z6836 Body mass index (BMI) 36.0-36.9, adult: Secondary | ICD-10-CM | POA: Diagnosis not present

## 2024-01-15 DIAGNOSIS — Z833 Family history of diabetes mellitus: Secondary | ICD-10-CM | POA: Diagnosis not present

## 2024-01-15 DIAGNOSIS — O2230 Deep phlebothrombosis in pregnancy, unspecified trimester: Secondary | ICD-10-CM | POA: Insufficient documentation

## 2024-01-15 DIAGNOSIS — K8010 Calculus of gallbladder with chronic cholecystitis without obstruction: Secondary | ICD-10-CM | POA: Diagnosis not present

## 2024-01-15 DIAGNOSIS — R739 Hyperglycemia, unspecified: Secondary | ICD-10-CM | POA: Diagnosis present

## 2024-01-15 DIAGNOSIS — K819 Cholecystitis, unspecified: Principal | ICD-10-CM | POA: Diagnosis present

## 2024-01-15 DIAGNOSIS — Z923 Personal history of irradiation: Secondary | ICD-10-CM

## 2024-01-15 DIAGNOSIS — I739 Peripheral vascular disease, unspecified: Secondary | ICD-10-CM | POA: Diagnosis present

## 2024-01-15 DIAGNOSIS — K420 Umbilical hernia with obstruction, without gangrene: Secondary | ICD-10-CM | POA: Diagnosis present

## 2024-01-15 DIAGNOSIS — Z8541 Personal history of malignant neoplasm of cervix uteri: Secondary | ICD-10-CM | POA: Diagnosis not present

## 2024-01-15 HISTORY — DX: Long term (current) use of anticoagulants: Z79.01

## 2024-01-15 LAB — BASIC METABOLIC PANEL WITH GFR
Anion gap: 10 (ref 5–15)
BUN: 9 mg/dL (ref 6–20)
CO2: 23 mmol/L (ref 22–32)
Calcium: 9.5 mg/dL (ref 8.9–10.3)
Chloride: 104 mmol/L (ref 98–111)
Creatinine, Ser: 0.78 mg/dL (ref 0.44–1.00)
GFR, Estimated: >60 mL/min (ref >60)
Glucose, Bld: 146 mg/dL — ABNORMAL HIGH (ref 70–99)
Potassium: 4.2 mmol/L (ref 3.5–5.1)
Sodium: 137 mmol/L (ref 135–145)

## 2024-01-15 LAB — CBC
HCT: 44.4 % (ref 36.0–46.0)
Hemoglobin: 14.2 g/dL (ref 12.0–15.0)
MCH: 26 pg (ref 26.0–34.0)
MCHC: 32 g/dL (ref 30.0–36.0)
MCV: 81.2 fL (ref 80.0–100.0)
Platelets: 322 10*3/uL (ref 150–400)
RBC: 5.47 MIL/uL — ABNORMAL HIGH (ref 3.87–5.11)
RDW: 19 % — ABNORMAL HIGH (ref 11.5–15.5)
WBC: 7.8 10*3/uL (ref 4.0–10.5)
nRBC: 0 % (ref 0.0–0.2)

## 2024-01-15 LAB — TROPONIN I (HIGH SENSITIVITY)
Troponin I (High Sensitivity): 20 ng/L — ABNORMAL HIGH (ref <18)
Troponin I (High Sensitivity): 23 ng/L — ABNORMAL HIGH (ref <18)

## 2024-01-15 LAB — HEPATIC FUNCTION PANEL
ALT: 55 U/L — ABNORMAL HIGH (ref 0–44)
AST: 41 U/L (ref 15–41)
Albumin: 3.7 g/dL (ref 3.5–5.0)
Alkaline Phosphatase: 168 U/L — ABNORMAL HIGH (ref 38–126)
Bilirubin, Direct: 0.2 mg/dL (ref 0.0–0.2)
Indirect Bilirubin: 0.4 mg/dL (ref 0.3–0.9)
Total Bilirubin: 0.6 mg/dL (ref 0.0–1.2)
Total Protein: 7.4 g/dL (ref 6.5–8.1)

## 2024-01-15 LAB — MAGNESIUM: Magnesium: 2.3 mg/dL (ref 1.7–2.4)

## 2024-01-15 LAB — PHOSPHORUS: Phosphorus: 2.3 mg/dL — ABNORMAL LOW (ref 2.5–4.6)

## 2024-01-15 LAB — LIPASE, BLOOD: Lipase: 24 U/L (ref 11–51)

## 2024-01-15 MED ORDER — MORPHINE SULFATE (PF) 4 MG/ML IV SOLN
4.0000 mg | Freq: Once | INTRAVENOUS | Status: AC
  Administered 2024-01-15: 4 mg via INTRAVENOUS
  Filled 2024-01-15: qty 1

## 2024-01-15 MED ORDER — SIMETHICONE 40 MG/0.6ML PO SUSP
80.0000 mg | Freq: Four times a day (QID) | ORAL | Status: DC | PRN
Start: 2024-01-15 — End: 2024-01-19

## 2024-01-15 MED ORDER — SODIUM CHLORIDE 0.9 % IV SOLN
2.0000 g | Freq: Every day | INTRAVENOUS | Status: DC
  Administered 2024-01-15: 2 g via INTRAVENOUS
  Filled 2024-01-15: qty 20

## 2024-01-15 MED ORDER — CALCIUM POLYCARBOPHIL 625 MG PO TABS
625.0000 mg | ORAL_TABLET | Freq: Two times a day (BID) | ORAL | Status: DC
  Administered 2024-01-15: 625 mg via ORAL
  Filled 2024-01-15 (×8): qty 1

## 2024-01-15 MED ORDER — ONDANSETRON HCL 4 MG/2ML IJ SOLN
4.0000 mg | Freq: Once | INTRAMUSCULAR | Status: AC
  Administered 2024-01-15: 4 mg via INTRAVENOUS
  Filled 2024-01-15: qty 2

## 2024-01-15 MED ORDER — LACTATED RINGERS IV BOLUS
1000.0000 mL | Freq: Once | INTRAVENOUS | Status: AC
  Administered 2024-01-15: 1000 mL via INTRAVENOUS

## 2024-01-15 MED ORDER — ACETAMINOPHEN 325 MG PO TABS: 325.0000 mg | ORAL_TABLET | Freq: Four times a day (QID) | ORAL | Status: DC | PRN

## 2024-01-15 MED ORDER — LACTATED RINGERS IV BOLUS: 1000.0000 mL | Freq: Three times a day (TID) | INTRAVENOUS | Status: DC | PRN

## 2024-01-15 MED ORDER — BISACODYL 10 MG RE SUPP: 10.0000 mg | Freq: Two times a day (BID) | RECTAL | Status: DC | PRN

## 2024-01-15 MED ORDER — SODIUM CHLORIDE 0.9 % IV SOLN
2.0000 g | INTRAVENOUS | Status: DC
  Administered 2024-01-16: 2 g via INTRAVENOUS
  Filled 2024-01-15: qty 20

## 2024-01-15 MED ORDER — HYDROMORPHONE HCL 1 MG/ML IJ SOLN
0.5000 mg | INTRAMUSCULAR | Status: DC | PRN
  Administered 2024-01-15: 1 mg via INTRAVENOUS

## 2024-01-15 MED ORDER — PHENOL 1.4 % MT LIQD: 2.0000 | OROMUCOSAL | Status: DC | PRN

## 2024-01-15 MED ORDER — NAPHAZOLINE-GLYCERIN 0.012-0.25 % OP SOLN: 1.0000 [drp] | Freq: Four times a day (QID) | OPHTHALMIC | Status: DC | PRN

## 2024-01-15 MED ORDER — METHOCARBAMOL 500 MG PO TABS
1000.0000 mg | ORAL_TABLET | Freq: Four times a day (QID) | ORAL | Status: DC | PRN
  Administered 2024-01-16 – 2024-01-17 (×4): 1000 mg via ORAL
  Filled 2024-01-15 (×4): qty 2

## 2024-01-15 MED ORDER — OXYCODONE HCL 5 MG PO TABS: 2.5000 mg | ORAL_TABLET | ORAL | Status: DC | PRN

## 2024-01-15 MED ORDER — ASPIRIN 81 MG PO TBEC
81.0000 mg | DELAYED_RELEASE_TABLET | Freq: Every day | ORAL | Status: DC
  Administered 2024-01-16 – 2024-01-19 (×3): 81 mg via ORAL
  Filled 2024-01-15 (×3): qty 1

## 2024-01-15 MED ORDER — MAGIC MOUTHWASH: 15.0000 mL | Freq: Four times a day (QID) | ORAL | Status: DC | PRN

## 2024-01-15 MED ORDER — SODIUM CHLORIDE 0.9% FLUSH
3.0000 mL | Freq: Two times a day (BID) | INTRAVENOUS | Status: DC
  Administered 2024-01-15 – 2024-01-19 (×6): 3 mL via INTRAVENOUS

## 2024-01-15 MED ORDER — POLYETHYLENE GLYCOL 3350 17 G PO PACK
17.0000 g | PACK | Freq: Every day | ORAL | Status: DC | PRN
  Administered 2024-01-18: 17 g via ORAL
  Filled 2024-01-15: qty 1

## 2024-01-15 MED ORDER — ACETAMINOPHEN 500 MG PO TABS: 1000.0000 mg | ORAL_TABLET | Freq: Four times a day (QID) | ORAL | Status: DC | PRN

## 2024-01-15 MED ORDER — PROCHLORPERAZINE EDISYLATE 10 MG/2ML IJ SOLN: 5.0000 mg | INTRAMUSCULAR | Status: DC | PRN

## 2024-01-15 MED ORDER — OXYCODONE HCL 5 MG PO TABS: 5.0000 mg | ORAL_TABLET | ORAL | Status: DC | PRN

## 2024-01-15 MED ORDER — ORAL CARE MOUTH RINSE: 15.0000 mL | OROMUCOSAL | Status: DC | PRN

## 2024-01-15 MED ORDER — OXYCODONE HCL 5 MG PO TABS
5.0000 mg | ORAL_TABLET | ORAL | Status: DC | PRN
  Administered 2024-01-15 – 2024-01-16 (×3): 5 mg via ORAL
  Filled 2024-01-15 (×3): qty 1

## 2024-01-15 MED ORDER — SALINE SPRAY 0.65 % NA SOLN: 1.0000 | Freq: Four times a day (QID) | NASAL | Status: DC | PRN

## 2024-01-15 MED ORDER — ONDANSETRON HCL 4 MG/2ML IJ SOLN
4.0000 mg | Freq: Four times a day (QID) | INTRAMUSCULAR | Status: DC | PRN
  Administered 2024-01-15 – 2024-01-17 (×3): 4 mg via INTRAVENOUS
  Filled 2024-01-15 (×4): qty 2

## 2024-01-15 MED ORDER — HYDROMORPHONE HCL 1 MG/ML IJ SOLN
0.5000 mg | INTRAMUSCULAR | Status: DC | PRN
  Administered 2024-01-16 (×4): 0.5 mg via INTRAVENOUS
  Filled 2024-01-15 (×4): qty 0.5

## 2024-01-15 MED ORDER — METRONIDAZOLE 500 MG/100ML IV SOLN
500.0000 mg | Freq: Two times a day (BID) | INTRAVENOUS | Status: DC
  Administered 2024-01-15 – 2024-01-17 (×4): 500 mg via INTRAVENOUS
  Filled 2024-01-15 (×4): qty 100

## 2024-01-15 MED ORDER — SODIUM CHLORIDE 0.9 % IV BOLUS
500.0000 mL | Freq: Once | INTRAVENOUS | Status: AC
  Administered 2024-01-15: 500 mL via INTRAVENOUS

## 2024-01-15 MED ORDER — MELATONIN 3 MG PO TABS
6.0000 mg | ORAL_TABLET | Freq: Every evening | ORAL | Status: DC | PRN
  Administered 2024-01-16: 6 mg via ORAL
  Filled 2024-01-15: qty 2

## 2024-01-15 MED ORDER — IOHEXOL 350 MG/ML SOLN
100.0000 mL | Freq: Once | INTRAVENOUS | Status: AC | PRN
  Administered 2024-01-15: 100 mL via INTRAVENOUS

## 2024-01-15 MED ORDER — HYDROMORPHONE HCL 1 MG/ML IJ SOLN
1.0000 mg | Freq: Once | INTRAMUSCULAR | Status: AC
  Administered 2024-01-15: 1 mg via INTRAVENOUS
  Filled 2024-01-15: qty 1

## 2024-01-15 MED ORDER — ALUM & MAG HYDROXIDE-SIMETH 200-200-20 MG/5ML PO SUSP: 30.0000 mL | Freq: Four times a day (QID) | ORAL | Status: DC | PRN

## 2024-01-15 MED ORDER — METHOCARBAMOL 1000 MG/10ML IJ SOLN: 1000.0000 mg | Freq: Four times a day (QID) | INTRAMUSCULAR | Status: DC | PRN

## 2024-01-15 MED ORDER — ACETAMINOPHEN 650 MG RE SUPP: 650.0000 mg | Freq: Four times a day (QID) | RECTAL | Status: DC | PRN

## 2024-01-15 MED ORDER — MENTHOL 3 MG MT LOZG: 1.0000 | LOZENGE | OROMUCOSAL | Status: DC | PRN

## 2024-01-15 MED ORDER — HEPARIN SODIUM (PORCINE) 5000 UNIT/ML IJ SOLN: 5000.0000 [IU] | Freq: Three times a day (TID) | INTRAMUSCULAR | Status: DC

## 2024-01-15 NOTE — Consult Note (Addendum)
 Emma Medina  06-01-1971 [id number]  CARE TEAM:  PCP: Darol Elizabeth, NP  Outpatient Care Team: Patient Care Team: Placey, Kevon Pellegrini, NP as PCP - General Abdul Hodgkin, MD as Consulting Physician (Obstetrics and Gynecology) Alphonso Aschoff, MD (Gynecologic Oncology) Almeda Jacobs, MD as Consulting Physician (Hematology and Oncology) Adine Hoof, MD as Consulting Physician (Vascular Surgery) Retta Caster, MD as Consulting Physician (Radiation Oncology)  Inpatient Treatment Team: Treatment Team:  Arnulfo Larch, MD Lorelei Rogers, EMT [facility], Md, MD Maindron, Rodrick, LPN   This patient is a 53 y.o.female who presents today for surgical evaluation at the request of Dr Nolia Baumgartner, [facility] ED.   Chief complaint / Reason for evaluation: Probable cholecystitis with dilated common bile duct  53 year old obese woman with numerous medical issues.  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  Peripheral vascular disease with thrombectomy as needed in the past.  History of DVT.  Chronically anticoagulated.  Had has had intermittent upper abdominal primarily epigastric pain for the past year.  Usually food triggers.  Feels like she is bloated and constipated.  No diarrhea.  No sick contacts.  She got another attack yesterday.  Pain would not go away.  Persistent.  She does have chronic pain issues.  This is different.  Patient came one nauseated and vomited.  Recurred.  Was concerned.  Came emergency department.  Exam with abdominal discomfort.  CT scan ruled out pulmonary embolism but has very large gallbladder with numerous stones.  Suspicious for cholecystitis.  More suspicious on ultrasound.  No other obvious major concerns.  Surgical consultation requested.   Assessment  Emma Medina  53 y.o. female       Problem List:  Principal Problem:   Acute calculous cholecystitis Active Problems:   Obesity (BMI 35.0-39.9 without comorbidity)   At risk for  hemorrhage associated with anticoagulation therapy   Peripheral vascular disease of lower extremity (HCC)   Common bile duct dilatation   Hypertension   Chronic anticoagulation   Elevated troponin level   Hyperglycemia   History of DVT (deep vein thrombosis)   Umbilical hernia, incarcerated   Cholecystitis Patient with probable cholecystitis with giant 15 cm gallbladder and stones and inflammation.  Dilated common bile duct with no definite severe elevated LFTs/choledocholithiasis  Plan:  Medical admission  Clear liquids only for now.  Nausea and pain control.  IV antibiotics.  Will do ceftriaxone for now.  Medical clearance.  With some epigastric chest discomfort cardiac and pulmonary workup seems to be negative although troponin levels are borderline elevated.  Defer to medicine on workup to see if more thorough evaluation or getting cardiology involved as needed.  Most likely would benefit from cholecystectomy this hospitalization.  Most likely will need to be a lower port placements adjusted since the tip of the gallbladder goes into the right lower quadrant & quite long/large.  Follow LFTs.  If increasing, obtain MRCP to rule out choledocholithiasis.  If bilirubin less than 4 & not worsening, proceed with cholecystectomy first once cleared by medicine and off anticoagulation Xerelto for greater than 48 hours = most likely surgery Tuesday at the soonest.  Patient also has umbilical hernia.  Appears to be fat-containing.  Hopefully can plan removal gallbladder through that spot & primarily repair  -monitor electrolytes & replace as needed  Keep K>4, Mg>2, Phos>3  -VTE prophylaxis- SCDs.  Anticoagulation prophyllaxis SQ as appropriate.  Holding oral DOAC anticoagulation.  Defer the need for bridging heparin  to admitting primary service  -mobilize as tolerated to help recovery.  Enlist therapies in moderate/high risk patients as appropriate  Surgery will help  follow.        I reviewed nursing notes, ED provider notes, last 24 h vitals and pain scores, last 48 h intake and output, last 24 h labs and trends, and last 24 h imaging results. I have reviewed this patient's available data, including medical history, events of note, test results, etc as part of my evaluation.  A significant portion of that time was spent in counseling.  Care during the described time interval was provided by me.  This care required moderate level of medical decision making.  01/15/2024  Eddye Goodie, MD, FACS, MASCRS Esophageal, Gastrointestinal & Colorectal Surgery Robotic and Minimally Invasive Surgery  [facility] [address] [place], [place] [address] [phone] Fax [phone] Main  CONTACT INFORMATION: Weekday (9AM-5PM): Call [facility] main office at [phone] Weeknight (5PM-9AM) or Weekend/Holiday: Check EPIC "Web Links" tab & use "AMION" (password " [email or web address]") for General Surgery [facility] coverage  Please, DO NOT use SecureChat  (it is not reliable communication to reach operating surgeons & will lead to a delay in care).   Epic staff messaging available for outptient concerns needing 1-2 business day response.      01/15/2024      Past Medical History:  Diagnosis Date   Cervical cancer (HCC)    Chronic anticoagulation 01/15/2024   DVT (deep vein thrombosis) in pregnancy    History of radiation therapy 08/10/2019   cervix 07/09/2019-08/10/2019  Dr Retta Caster   History of radiation therapy 09/11/2019   vaginal brachytherapy 08/14/2019-09/11/2019  Dr Retta Caster   Hypertension    Malignant neoplasm of overlapping sites of cervix (HCC) 06/01/2019   Obesity (BMI 35.0-39.9 without comorbidity) 06/01/2019   Obstructive thrombus 2019   left leg   Sciatica    back    Past Surgical History:  Procedure Laterality Date   ABDOMINAL AORTOGRAM N/A 05/18/2018   Procedure: ABDOMINAL  AORTOGRAM;  Surgeon: Adine Hoof, MD;  Location[facility] INVASIVE CV LAB;  Service: Cardiovascular;  Laterality: N/A;   ABDOMINAL AORTOGRAM W/LOWER EXTREMITY Left 10/26/2018   Procedure: ABDOMINAL AORTOGRAM W/LOWER EXTREMITY;  Surgeon: Adine Hoof, MD;  Location[facility]VAS[facility] CV LAB;  Service: Cardiovascular;  Laterality: Left;   IR IMAGING GUIDED PORT INSERTION  07/05/2019   IR REMOVAL TUN ACCESS W/ PORT W/O FL MOD SED  12/25/2019   LOWER EXTREMITY ANGIOGRAPHY Bilateral 05/18/2018   Procedure: LOWER EXTREMITY ANGIOGRAPHY;  Surgeon: Adine Hoof, MD;  Location: [facility] INVASIVE CV LAB;  Service: Cardiovascular;  Laterality: Bilateral;   OPERATIVE ULTRASOUND N/A 08/14/2019   Procedure: OPERATIVE ULTRASOUND;  Surgeon: Retta Caster, MD;  Location: [facility];  Service: Urology;  Laterality: N/A;   OPERATIVE ULTRASOUND N/A 08/20/2019   Procedure: OPERATIVE ULTRASOUND;  Surgeon: Retta Caster, MD;  Location: [facility];  Service: Urology;  Laterality: N/A;   OPERATIVE ULTRASOUND N/A 08/29/2019   Procedure: OPERATIVE ULTRASOUND;  Surgeon: Retta Caster, MD;  Location: [facility];  Service: Urology;  Laterality: N/A;   OPERATIVE ULTRASOUND N/A 09/06/2019   Procedure: OPERATIVE ULTRASOUND;  Surgeon: Retta Caster, MD;  Location: [facility];  Service: Urology;  Laterality: N/A;   OPERATIVE ULTRASOUND N/A 09/11/2019   Procedure: OPERATIVE ULTRASOUND;  Surgeon: Retta Caster, MD;  Location: [facility];  Service:  Urology;  Laterality: N/A;   TANDEM RING INSERTION N/A 08/14/2019   Procedure: TANDEM RING INSERTION - FIRST;  Surgeon: Retta Caster, MD;  Location: [facility];  Service: Urology;  Laterality: N/A;   TANDEM RING INSERTION N/A 08/20/2019   Procedure: TANDEM RING INSERTION;  Surgeon: Retta Caster, MD;  Location: [facility];  Service: Urology;  Laterality: N/A;    TANDEM RING INSERTION N/A 08/29/2019   Procedure: TANDEM RING INSERTION;  Surgeon: Retta Caster, MD;  Location: [facility];  Service: Urology;  Laterality: N/A;   TANDEM RING INSERTION N/A 09/06/2019   Procedure: TANDEM RING INSERTION;  Surgeon: Retta Caster, MD;  Location: [facility];  Service: Urology;  Laterality: N/A;   TANDEM RING INSERTION N/A 09/11/2019   Procedure: TANDEM RING INSERTION;  Surgeon: Retta Caster, MD;  Location: [facility];  Service: Urology;  Laterality: N/A;   THROMBECTOMY FEMORAL ARTERY Left 05/18/2018   Procedure: LEFT LEG THROMBECTOMY, BALLOON ANGIOPLASTY LEFT POSTERIOR TIBIAL ARTERY;  Surgeon: Adine Hoof, MD;  Location: [facility] OR;  Service: Vascular;  Laterality: Left;   TUBAL LIGATION      Social History   Socioeconomic History   Marital status: Single    Spouse name: Not on file   Number of children: Not on file   Years of education: Not on file   Highest education level: 12th grade  Occupational History   Not on file  Tobacco Use   Smoking status: Former    Current packs/day: 0.00    Average packs/day: 1 pack/day for 15.0 years (15.0 ttl pk-yrs)    Types: Cigarettes    Start date: 07/02/2001    Quit date: 07/02/2016    Years since quitting: 7.5   Smokeless tobacco: Never  Vaping Use   Vaping status: Never Used  Substance and Sexual Activity   Alcohol use: Yes    Comment: rare   Drug use: Yes    Frequency: 7.0 times per week    Types: Marijuana    Comment: smokes blunt on occasion last blunt 07-12-2019   Sexual activity: Yes    Birth control/protection: Surgical  Other Topics Concern   Not on file  Social History Narrative   Not on file   Social Drivers of Health   Financial Resource Strain: Not on file  Food Insecurity: Not on file  Transportation Needs: No Transportation Needs (10/16/2019)   PRAPARE - Administrator, Civil Service (Medical): No    Lack of  Transportation (Non-Medical): No  Physical Activity: Not on file  Stress: Not on file  Social Connections: Not on file  Intimate Partner Violence: Not on file    Family History  Problem Relation Age of Onset   Cancer Mother        brain and uterine   Stroke Brother    Diabetes Brother    Clotting disorder Brother    Breast cancer Neg Hx     Current Facility-Administered Medications  Medication Dose Route Frequency Provider Last Rate Last Admin   acetaminophen (TYLENOL) suppository 650 mg  650 mg Rectal Q6H PRN Candyce Champagne, MD       acetaminophen (TYLENOL) tablet 325-650 mg  325-650 mg Oral Q6H PRN Candyce Champagne, MD       alum & mag hydroxide-simeth (MAALOX/MYLANTA) 200-200-20 MG/5ML suspension 30 mL  30 mL Oral Q6H PRN Candyce Champagne, MD       bisacodyl (DULCOLAX) suppository 10 mg  10  mg Rectal Q12H PRN Candyce Champagne, MD       cefTRIAXone (ROCEPHIN) 2 g in sodium chloride 0.9 % 100 mL IVPB  2 g Intravenous q1800 Candyce Champagne, MD       HYDROmorphone (DILAUDID) injection 0.5-2 mg  0.5-2 mg Intravenous Q2H PRN Candyce Champagne, MD       lactated ringers bolus 1,000 mL  1,000 mL Intravenous Once Candyce Champagne, MD       lactated ringers bolus 1,000 mL  1,000 mL Intravenous Q8H PRN Candyce Champagne, MD       magic mouthwash  15 mL Oral QID PRN Candyce Champagne, MD       menthol-cetylpyridinium (CEPACOL) lozenge 3 mg  1 lozenge Oral PRN Candyce Champagne, MD       methocarbamol (ROBAXIN) injection 1,000 mg  1,000 mg Intravenous Q6H PRN Candyce Champagne, MD       methocarbamol (ROBAXIN) tablet 1,000 mg  1,000 mg Oral Q6H PRN Candyce Champagne, MD       naphazoline-glycerin (CLEAR EYES REDNESS) ophth solution 1-2 drop  1-2 drop Both Eyes QID PRN Candyce Champagne, MD       ondansetron (ZOFRAN) injection 4 mg  4 mg Intravenous Q6H PRN Candyce Champagne, MD       oxyCODONE (Oxy IR/ROXICODONE) immediate release tablet 5-10 mg  5-10 mg Oral Q4H PRN Candyce Champagne, MD       phenol (CHLORASEPTIC) mouth spray 2 spray  2  spray Mouth/Throat PRN Candyce Champagne, MD       polycarbophil (FIBERCON) tablet 625 mg  625 mg Oral BID Candyce Champagne, MD       prochlorperazine (COMPAZINE) injection 5-10 mg  5-10 mg Intravenous Q4H PRN Candyce Champagne, MD       simethicone (MYLICON) 40 MG/0.6ML suspension 80 mg  80 mg Oral QID PRN Candyce Champagne, MD       sodium chloride (OCEAN) 0.65 % nasal spray 1-2 spray  1-2 spray Each Nare Q6H PRN Candyce Champagne, MD       Current Outpatient Medications  Medication Sig Dispense Refill   aspirin EC 81 MG EC tablet Take 1 tablet (81 mg total) by mouth daily.     rivaroxaban (XARELTO) 20 MG TABS tablet Take 1 tablet (20 mg total) by mouth daily with supper. 90 tablet 2     No Known Allergies   BP (!) 164/92 (BP Location: Right Arm)   Pulse 89   Temp 97.7 F (36.5 C) (Oral)   Resp 18   Ht 5\' 7"  (1.702 m)   Wt 106.6 kg   LMP 06/13/2019 Comment: radiation tx cervical ca  SpO2 99%   BMI 36.81 kg/m     Results:   Labs: Results for orders placed or performed during the hospital encounter of 01/15/24 (from the past 48 hours)  Basic metabolic panel     Status: Abnormal   Collection Time: 01/15/24  2:16 PM  Result Value Ref Range   Sodium 137 135 - 145 mmol/L   Potassium 4.2 3.5 - 5.1 mmol/L   Chloride 104 98 - 111 mmol/L   CO2 23 22 - 32 mmol/L   Glucose, Bld 146 (H) 70 - 99 mg/dL    Comment: Glucose reference range applies only to samples taken after fasting for at least 8 hours.   BUN 9 6 - 20 mg/dL   Creatinine, Ser 1.61 0.44 - 1.00 mg/dL   Calcium 9.5 8.9 - 09.6 mg/dL   GFR, Estimated >04 >54 mL/min  Comment: (NOTE) Calculated using the CKD-EPI Creatinine Equation (2021)    Anion gap 10 5 - 15    Comment: Performed at [facility], [address] [place], [place] [address]  CBC     Status: Abnormal   Collection Time: 01/15/24  2:16 PM  Result Value Ref Range   WBC 7.8 4.0 - 10.5 K/uL   RBC 5.47 (H) 3.87 - 5.11 MIL/uL   Hemoglobin 14.2 12.0 -  15.0 g/dL   HCT 60.4 54.0 - 98.1 %   MCV 81.2 80.0 - 100.0 fL   MCH 26.0 26.0 - 34.0 pg   MCHC 32.0 30.0 - 36.0 g/dL   RDW 19.1 (H) 47.8 - 29.5 %   Platelets 322 150 - 400 K/uL   nRBC 0.0 0.0 - 0.2 %    Comment: Performed at [facility], [address] [place], [place] [address]  Troponin I (High Sensitivity)     Status: Abnormal   Collection Time: 01/15/24  2:16 PM  Result Value Ref Range   Troponin I (High Sensitivity) 20 (H) <18 ng/L    Comment: (NOTE) Elevated high sensitivity troponin I (hsTnI) values and significant  changes across serial measurements may suggest ACS but many other  chronic and acute conditions are known to elevate hsTnI results.  Refer to the "Links" section for chest pain algorithms and additional  guidance. Performed at [facility], [address] [place], [place] [address]   Hepatic function panel     Status: Abnormal   Collection Time: 01/15/24  3:49 PM  Result Value Ref Range   Total Protein 7.4 6.5 - 8.1 g/dL   Albumin 3.7 3.5 - 5.0 g/dL   AST 41 15 - 41 U/L   ALT 55 (H) 0 - 44 U/L   Alkaline Phosphatase 168 (H) 38 - 126 U/L   Total Bilirubin 0.6 0.0 - 1.2 mg/dL   Bilirubin, Direct 0.2 0.0 - 0.2 mg/dL   Indirect Bilirubin 0.4 0.3 - 0.9 mg/dL    Comment: Performed at [facility], [address] [place], [place] [address]  Lipase, blood     Status: None   Collection Time: 01/15/24  3:49 PM  Result Value Ref Range   Lipase 24 11 - 51 U/L    Comment: Performed at [facility], [address] [place], [place] [address]  Troponin I (High Sensitivity)     Status: Abnormal   Collection Time: 01/15/24  5:00 PM  Result Value Ref Range   Troponin I (High Sensitivity) 23 (H) <18 ng/L    Comment: (NOTE) Elevated high sensitivity troponin I (hsTnI) values and significant  changes across serial measurements may suggest ACS but many other  chronic and acute conditions are  known to elevate hsTnI results.  Refer to the "Links" section for chest pain algorithms and additional  guidance. Performed at [facility], [address] [place], [place] [address]     Imaging / Studies: CT Angio Chest/Abd/Pel for Dissection W and/or W/WO Result Date: 01/15/2024 CLINICAL DATA:  Chest pain and vomiting with shortness of breath. History of squamous cell carcinoma of the cervix. EXAM: CT ANGIOGRAPHY CHEST, ABDOMEN AND PELVIS TECHNIQUE: Non-contrast CT of the chest was initially obtained. Multidetector CT imaging through the chest, abdomen and pelvis was performed using the standard protocol during bolus administration of intravenous contrast. Multiplanar reconstructed images and MIPs were obtained and reviewed to evaluate the vascular anatomy. RADIATION DOSE  REDUCTION: This exam was performed according to the departmental dose-optimization program which includes automated exposure control, adjustment of the mA and/or kV according to patient size and/or use of iterative reconstruction technique. CONTRAST:  OMNIPAQUE IOHEXOL 350 MG/ML SOLN COMPARISON:  Ultrasound abdomen 01/15/2024. CT abdomen and pelvis 02/16/2023. PET CT 02/17/2021. FINDINGS: CTA CHEST FINDINGS Cardiovascular: Preferential opacification of the thoracic aorta. No evidence of thoracic aortic aneurysm or dissection. Normal heart size. No pericardial effusion. Mediastinum/Nodes: No enlarged mediastinal, hilar, or axillary lymph nodes. Thyroid gland, trachea, and esophagus demonstrate no significant findings. There is soft tissue in the upper anterior mediastinum which is new from 2022. Lungs/Pleura: There are minimal patchy ground-glass opacities in the inferior right middle lobe and right lower lobe. There is no pleural effusion or pneumothorax. There are few new pulmonary nodules in the right upper lobe measuring 3-4 mm. Musculoskeletal: No chest wall abnormality. No acute or significant osseous  findings. Review of the MIP images confirms the above findings. CTA ABDOMEN AND PELVIS FINDINGS VASCULAR Aorta: Normal caliber aorta without aneurysm, dissection, vasculitis or significant stenosis. Celiac: Patent without evidence of aneurysm, dissection, vasculitis or significant stenosis. SMA: Patent without evidence of aneurysm, dissection, vasculitis or significant stenosis. Renals: Both renal arteries are patent without evidence of aneurysm, dissection, vasculitis, fibromuscular dysplasia or significant stenosis. IMA: Patent without evidence of aneurysm, dissection, vasculitis or significant stenosis. Inflow: Patent without evidence of aneurysm, dissection, vasculitis or significant stenosis. Veins: No obvious venous abnormality within the limitations of this arterial phase study. Review of the MIP images confirms the above findings. NON-VASCULAR Hepatobiliary: Multiple gallstones are present. The gallbladder is dilated. There is some questionable mild gallbladder wall edema. There is no biliary ductal dilatation. No focal liver lesions are identified. There is no surrounding inflammation. Pancreas: Unremarkable. No pancreatic ductal dilatation or surrounding inflammatory changes. Spleen: Normal in size without focal abnormality. Adrenals/Urinary Tract: Adrenal glands are unremarkable. Kidneys are normal, without renal calculi, focal lesion, or hydronephrosis. Bladder is unremarkable. Stomach/Bowel: Stomach is within normal limits. Appendix appears normal. No evidence of bowel wall thickening, distention, or inflammatory changes. Lymphatic: No enlarged lymph nodes are identified. There is some prominent inguinal lymph nodes bilaterally. Reproductive: Uterus and bilateral adnexa are unremarkable. Other: There is a small to moderate size fat containing umbilical hernia. There is no ascites. There is mild body wall edema. Musculoskeletal: Degenerative changes affect the spine. Review of the MIP images confirms the  above findings. IMPRESSION: 1. No evidence for aortic dissection or aneurysm. 2. Cholelithiasis with dilated gallbladder and questionable mild gallbladder wall edema. Correlate clinically for acute cholecystitis. 3. Soft tissue in the upper anterior mediastinum is new from 2022. This could represent thymic hyperplasia, but other etiologies are not excluded. Recommend clinical correlation and follow-up. 4. New 3-4 mm pulmonary nodules in the right upper lobe, indeterminate. Follow-up recommended. 5. Mild ground-glass opacities in the inferior right middle lobe and right lower lobe which may represent infectious/inflammatory process. Electronically Signed   By: Tyron Gallon M.D.   On: 01/15/2024 18:40   US  Abdomen Limited RUQ (LIVER/GB) Result Date: 01/15/2024 CLINICAL DATA:  [id number] RUQ discomfort [id number] EXAM: ULTRASOUND ABDOMEN LIMITED RIGHT UPPER QUADRANT COMPARISON:  Feb 16, 2023 FINDINGS: Technically difficult exam due to patient's body habitus and overlying bowel gas. Gallbladder: Biliary sludge and multiple gallstones filling the gallbladder. No sonographic Murphy sign or wall thickening. No pericholecystic fluid. Common bile duct: Diameter: 14 mm Liver: No focal lesion identified. Increased echogenicity. Portal vein is patent on color Doppler  imaging with normal direction of blood flow towards the liver. Other: None. IMPRESSION: 1. Moderate dilation of the common bile duct measuring 14 mm. Correlation with serum bilirubin recommended. A nonemergent multiphase abdominal MRI with IV contrast is recommended to evaluate for downstream obstruction. 2. Cholecystolithiasis in biliary sludge in the gallbladder. No findings of acute cholecystitis. 3. Hepatic steatosis. Electronically Signed   By: Rance Burrows M.D.   On: 01/15/2024 16:24   DG Chest 2 View Result Date: 01/15/2024 CLINICAL DATA:  Chest pain EXAM: CHEST - 2 VIEW COMPARISON:  December 07, 2019 FINDINGS: Low lung volumes. No focal airspace  consolidation, pleural effusion, or pneumothorax. No cardiomegaly. No acute fracture or destructive lesion. Multilevel thoracic osteophytosis. IMPRESSION: No acute cardiopulmonary abnormality. Electronically Signed   By: Rance Burrows M.D.   On: 01/15/2024 14:23    Medications / Allergies: per chart  Antibiotics: Anti-infectives (From admission, onward)    Start     Dose/Rate Route Frequency Ordered Stop   01/15/24 2100  cefTRIAXone (ROCEPHIN) 2 g in sodium chloride 0.9 % 100 mL IVPB       Note to Pharmacy: Pharmacy may adjust dosing strength / duration / interval for maximal efficacy   2 g 200 mL/hr over 30 Minutes Intravenous Daily-1800 01/15/24 2028           Note: Portions of this report may have been transcribed using voice recognition software. Every effort was made to ensure accuracy; however, inadvertent computerized transcription errors may be present.   Any transcriptional errors that result from this process are unintentional.    Eddye Goodie, MD, FACS, MASCRS Esophageal, Gastrointestinal & Colorectal Surgery Robotic and Minimally Invasive Surgery  [facility] [address] [place], [place] [address] [phone] Fax [phone] Main  CONTACT INFORMATION: Weekday (9AM-5PM): Call [facility] main office at [phone] Weeknight (5PM-9AM) or Weekend/Holiday: Check EPIC "Web Links" tab & use "AMION" (password " [email or web address]") for General Surgery [facility] coverage  Please, DO NOT use SecureChat  (it is not reliable communication to reach operating surgeons & will lead to a delay in care).   Epic staff messaging available for outptient concerns needing 1-2 business day response.       01/15/2024  8:50 PM

## 2024-01-15 NOTE — ED Notes (Signed)
 Lt green hemolyzed, recollect complete

## 2024-01-15 NOTE — H&P (Signed)
 History and Physical    Emma Medina ZOX:[id number] DOB: Dec 09, 1970 DOA: 01/15/2024  PCP: Darol Elizabeth, NP   Patient coming from: Home   Chief Complaint:  Chief Complaint  Patient presents with   Chest Pain   Emesis    HPI:  Emma Medina is a 53 y.o. 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  Reports that pain began over a year ago with intermittent right upper quadrant pain/epigastric pain worse with meals.  In the past day has become severe and constant.  Associated nausea and vomiting. ***    Review of Systems:  ROS complete and negative except as marked above   No Known Allergies  Prior to Admission medications   Medication Sig Start Date End Date Taking? Authorizing Provider  aspirin EC 81 MG EC tablet Take 1 tablet (81 mg total) by mouth daily. 05/23/18  Yes Butch Cashing, PA-C  rivaroxaban (XARELTO) 20 MG TABS tablet Take 1 tablet (20 mg total) by mouth daily with supper. 01/06/24  Yes Adine Hoof, MD    Past Medical History:  Diagnosis Date   Cervical cancer [facility])    Chronic anticoagulation 01/15/2024   DVT (deep vein thrombosis) in pregnancy    History of radiation therapy 08/10/2019   cervix 07/09/2019-08/10/2019  Dr Retta Caster   History of radiation therapy 09/11/2019   vaginal brachytherapy 08/14/2019-09/11/2019  Dr Retta Caster   Hypertension    Malignant neoplasm of overlapping sites of cervix (HCC) 06/01/2019   Obesity (BMI 35.0-39.9 without comorbidity) 06/01/2019   Obstructive thrombus 2019   left leg   Sciatica    back    Past Surgical History:  Procedure Laterality Date   ABDOMINAL AORTOGRAM N/A 05/18/2018   Procedure: ABDOMINAL AORTOGRAM;  Surgeon: Adine Hoof, MD;  Location:  [facility] INVASIVE CV LAB;  Service: Cardiovascular;  Laterality: N/A;   ABDOMINAL AORTOGRAM W/LOWER EXTREMITY Left 10/26/2018   Procedure: ABDOMINAL AORTOGRAM W/LOWER EXTREMITY;  Surgeon: Adine Hoof, MD;  Location: [facility] INVASIVE CV LAB;  Service: Cardiovascular;  Laterality: Left;   IR IMAGING GUIDED PORT INSERTION  07/05/2019   IR REMOVAL TUN ACCESS W/ PORT W/O FL MOD SED  12/25/2019   LOWER EXTREMITY ANGIOGRAPHY Bilateral 05/18/2018   Procedure: LOWER EXTREMITY ANGIOGRAPHY;  Surgeon: Adine Hoof, MD;  Location: [facility] INVASIVE CV LAB;  Service: Cardiovascular;  Laterality: Bilateral;   OPERATIVE ULTRASOUND N/A 08/14/2019   Procedure: OPERATIVE ULTRASOUND;  Surgeon: Retta Caster, MD;  Location: [facility];  Service: Urology;  Laterality: N/A;   OPERATIVE ULTRASOUND N/A 08/20/2019   Procedure: OPERATIVE ULTRASOUND;  Surgeon: Retta Caster, MD;  Location: [facility];  Service: Urology;  Laterality: N/A;   OPERATIVE ULTRASOUND N/A 08/29/2019   Procedure: OPERATIVE ULTRASOUND;  Surgeon: Retta Caster, MD;  Location: [facility];  Service: Urology;  Laterality: N/A;   OPERATIVE ULTRASOUND N/A 09/06/2019   Procedure: OPERATIVE ULTRASOUND;  Surgeon: Retta Caster, MD;  Location: [facility];  Service: Urology;  Laterality: N/A;   OPERATIVE ULTRASOUND N/A 09/11/2019   Procedure: OPERATIVE ULTRASOUND;  Surgeon: Retta Caster, MD;  Location: [facility];  Service: Urology;  Laterality: N/A;   TANDEM RING INSERTION N/A 08/14/2019   Procedure: TANDEM RING INSERTION - FIRST;  Surgeon: Retta Caster,  MD;  Location: [facility];  Service: Urology;  Laterality: N/A;   TANDEM RING INSERTION N/A 08/20/2019   Procedure: TANDEM RING INSERTION;  Surgeon: Retta Caster, MD;  Location: [facility];  Service: Urology;  Laterality: N/A;   TANDEM RING INSERTION N/A 08/29/2019   Procedure: TANDEM RING  INSERTION;  Surgeon: Retta Caster, MD;  Location: [facility];  Service: Urology;  Laterality: N/A;   TANDEM RING INSERTION N/A 09/06/2019   Procedure: TANDEM RING INSERTION;  Surgeon: Retta Caster, MD;  Location: [facility];  Service: Urology;  Laterality: N/A;   TANDEM RING INSERTION N/A 09/11/2019   Procedure: TANDEM RING INSERTION;  Surgeon: Retta Caster, MD;  Location: [facility];  Service: Urology;  Laterality: N/A;   THROMBECTOMY FEMORAL ARTERY Left 05/18/2018   Procedure: LEFT LEG THROMBECTOMY, BALLOON ANGIOPLASTY LEFT POSTERIOR TIBIAL ARTERY;  Surgeon: Adine Hoof, MD;  Location: [facility] OR;  Service: Vascular;  Laterality: Left;   TUBAL LIGATION       reports that she quit smoking about 7 years ago. Her smoking use included cigarettes. She started smoking about 22 years ago. She has a 15 pack-year smoking history. She has never used smokeless tobacco. She reports current alcohol use. She reports current drug use. Frequency: 7.00 times per week. Drug: Marijuana.  Family History  Problem Relation Age of Onset   Cancer Mother        brain and uterine   Stroke Brother    Diabetes Brother    Clotting disorder Brother    Breast cancer Neg Hx      Physical Exam: Vitals:   01/15/24 1346 01/15/24 1729 01/15/24 1729 01/15/24 2207  BP: (!) 171/112  (!) 164/92 (!) 161/86  Pulse: (!) 50  89 (!) 54  Resp: 18  18 17   Temp: (!) 97.5 F (36.4 C) 97.7 F (36.5 C)  97.9 F (36.6 C)  TempSrc: Oral Oral    SpO2: 100%  99% 100%  Weight: 106.6 kg     Height: 5\' 7"  (1.702 m)       Gen: Awake, alert, NAD ***   CV: Regular, normal S1, S2, no murmurs  Resp: Normal WOB, CTAB  Abd: Flat, normoactive, nontender MSK: Symmetric, no edema  Skin: No rashes or lesions to exposed skin  Neuro: Alert and interactive  Psych: euthymic, appropriate    Data review:   Labs reviewed, notable for:   Alk phos 168, other LFT  normal High-sensitivity troponin 28 -> 23 WBC 7  Micro:  Results for orders placed or performed during the hospital encounter of 08/31/22  SARS Coronavirus 2 by RT PCR (hospital order, performed in [facility] hospital lab) *cepheid single result test* Anterior Nasal Swab     Status: None   Collection Time: 08/31/22 12:26 PM   Specimen: Anterior Nasal Swab  Result Value Ref Range Status   SARS Coronavirus 2 by RT PCR NEGATIVE NEGATIVE Final    Comment: (NOTE) SARS-CoV-2 target nucleic acids are NOT DETECTED.  The SARS-CoV-2 RNA is generally detectable in upper and lower respiratory specimens during the acute phase of infection. The lowest concentration of SARS-CoV-2 viral copies this assay can detect is 250 copies / mL. A negative result does not preclude SARS-CoV-2 infection and should not be used as the sole basis for treatment or other patient management decisions.  A negative result may occur with improper specimen collection / handling, submission of specimen other than nasopharyngeal swab, presence  of viral mutation(s) within the areas targeted by this assay, and inadequate number of viral copies (<250 copies / mL). A negative result must be combined with clinical observations, patient history, and epidemiological information.  Fact Sheet for Patients:   RoadLapTop.co.za  Fact Sheet for Healthcare Providers: http://kim-miller.com/  This test is not yet approved or  cleared by the [place]  FDA and has been authorized for detection and/or diagnosis of SARS-CoV-2 by FDA under an Emergency Use Authorization (EUA).  This EUA will remain in effect (meaning this test can be used) for the duration of the COVID-19 declaration under Section 564(b)(1) of the Act, 21 U.S.C. section 360bbb-3(b)(1), unless the authorization is terminated or revoked sooner.  Performed at [facility], [address] [place],  [place] [address]   Group A Strep by PCR     Status: Abnormal   Collection Time: 08/31/22 12:26 PM   Specimen: Anterior Nasal Swab; Sterile Swab  Result Value Ref Range Status   Group A Strep by PCR DETECTED (A) NOT DETECTED Final    Comment: Performed at [facility], [address] [place], [place] [address]    Imaging reviewed:  CT Angio Chest/Abd/Pel for Dissection W and/or W/WO Result Date: 01/15/2024 CLINICAL DATA:  Chest pain and vomiting with shortness of breath. History of squamous cell carcinoma of the cervix. EXAM: CT ANGIOGRAPHY CHEST, ABDOMEN AND PELVIS TECHNIQUE: Non-contrast CT of the chest was initially obtained. Multidetector CT imaging through the chest, abdomen and pelvis was performed using the standard protocol during bolus administration of intravenous contrast. Multiplanar reconstructed images and MIPs were obtained and reviewed to evaluate the vascular anatomy. RADIATION DOSE REDUCTION: This exam was performed according to the departmental dose-optimization program which includes automated exposure control, adjustment of the mA and/or kV according to patient size and/or use of iterative reconstruction technique. CONTRAST:  OMNIPAQUE IOHEXOL 350 MG/ML SOLN COMPARISON:  Ultrasound abdomen 01/15/2024. CT abdomen and pelvis 02/16/2023. PET CT 02/17/2021. FINDINGS: CTA CHEST FINDINGS Cardiovascular: Preferential opacification of the thoracic aorta. No evidence of thoracic aortic aneurysm or dissection. Normal heart size. No pericardial effusion. Mediastinum/Nodes: No enlarged mediastinal, hilar, or axillary lymph nodes. Thyroid gland, trachea, and esophagus demonstrate no significant findings. There is soft tissue in the upper anterior mediastinum which is new from 2022. Lungs/Pleura: There are minimal patchy ground-glass opacities in the inferior right middle lobe and right lower lobe. There is no pleural effusion or pneumothorax. There are few new pulmonary nodules  in the right upper lobe measuring 3-4 mm. Musculoskeletal: No chest wall abnormality. No acute or significant osseous findings. Review of the MIP images confirms the above findings. CTA ABDOMEN AND PELVIS FINDINGS VASCULAR Aorta: Normal caliber aorta without aneurysm, dissection, vasculitis or significant stenosis. Celiac: Patent without evidence of aneurysm, dissection, vasculitis or significant stenosis. SMA: Patent without evidence of aneurysm, dissection, vasculitis or significant stenosis. Renals: Both renal arteries are patent without evidence of aneurysm, dissection, vasculitis, fibromuscular dysplasia or significant stenosis. IMA: Patent without evidence of aneurysm, dissection, vasculitis or significant stenosis. Inflow: Patent without evidence of aneurysm, dissection, vasculitis or significant stenosis. Veins: No obvious venous abnormality within the limitations of this arterial phase study. Review of the MIP images confirms the above findings. NON-VASCULAR Hepatobiliary: Multiple gallstones are present. The gallbladder is dilated. There is some questionable mild gallbladder wall edema. There is no biliary ductal dilatation. No focal liver lesions are identified. There is no surrounding inflammation. Pancreas: Unremarkable. No pancreatic ductal dilatation or surrounding inflammatory  changes. Spleen: Normal in size without focal abnormality. Adrenals/Urinary Tract: Adrenal glands are unremarkable. Kidneys are normal, without renal calculi, focal lesion, or hydronephrosis. Bladder is unremarkable. Stomach/Bowel: Stomach is within normal limits. Appendix appears normal. No evidence of bowel wall thickening, distention, or inflammatory changes. Lymphatic: No enlarged lymph nodes are identified. There is some prominent inguinal lymph nodes bilaterally. Reproductive: Uterus and bilateral adnexa are unremarkable. Other: There is a small to moderate size fat containing umbilical hernia. There is no ascites. There  is mild body wall edema. Musculoskeletal: Degenerative changes affect the spine. Review of the MIP images confirms the above findings. IMPRESSION: 1. No evidence for aortic dissection or aneurysm. 2. Cholelithiasis with dilated gallbladder and questionable mild gallbladder wall edema. Correlate clinically for acute cholecystitis. 3. Soft tissue in the upper anterior mediastinum is new from 2022. This could represent thymic hyperplasia, but other etiologies are not excluded. Recommend clinical correlation and follow-up. 4. New 3-4 mm pulmonary nodules in the right upper lobe, indeterminate. Follow-up recommended. 5. Mild ground-glass opacities in the inferior right middle lobe and right lower lobe which may represent infectious/inflammatory process. Electronically Signed   By: Tyron Gallon M.D.   On: 01/15/2024 18:40   US  Abdomen Limited RUQ (LIVER/GB) Result Date: 01/15/2024 CLINICAL DATA:  [id number] RUQ discomfort [id number] EXAM: ULTRASOUND ABDOMEN LIMITED RIGHT UPPER QUADRANT COMPARISON:  Feb 16, 2023 FINDINGS: Technically difficult exam due to patient's body habitus and overlying bowel gas. Gallbladder: Biliary sludge and multiple gallstones filling the gallbladder. No sonographic Murphy sign or wall thickening. No pericholecystic fluid. Common bile duct: Diameter: 14 mm Liver: No focal lesion identified. Increased echogenicity. Portal vein is patent on color Doppler imaging with normal direction of blood flow towards the liver. Other: None. IMPRESSION: 1. Moderate dilation of the common bile duct measuring 14 mm. Correlation with serum bilirubin recommended. A nonemergent multiphase abdominal MRI with IV contrast is recommended to evaluate for downstream obstruction. 2. Cholecystolithiasis in biliary sludge in the gallbladder. No findings of acute cholecystitis. 3. Hepatic steatosis. Electronically Signed   By: Rance Burrows M.D.   On: 01/15/2024 16:24   DG Chest 2 View Result Date: 01/15/2024 CLINICAL DATA:   Chest pain EXAM: CHEST - 2 VIEW COMPARISON:  December 07, 2019 FINDINGS: Low lung volumes. No focal airspace consolidation, pleural effusion, or pneumothorax. No cardiomegaly. No acute fracture or destructive lesion. Multilevel thoracic osteophytosis. IMPRESSION: No acute cardiopulmonary abnormality. Electronically Signed   By: Rance Burrows M.D.   On: 01/15/2024 14:23   EKG:  Personally reviewed, sinus rhythm with sinus arrhythmia, prolonged QTc 492, no acute ischemic changes.  ED Course:  Treated with morphine, Dilaudid, Zofran.  Ceftriaxone for presumed cholecystitis, 1.5 L IV fluid.  General surgery consulted and has seen the patient recommending for washout of DOAC possible cholecystectomy on Tuesday.    Assessment/Plan:  53 y.o. 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  Admitted with suspected cholecystitis, possible choledocholithiasis.  Suspected cholecystitis Possible choledocholithiasis 1 year of intermittent pain consistent with biliary colic and 1 day of worsening now constant pain.  On initial evaluation she is afebrile, vitals normal, WBC 7. Alk phos 168, other LFT wnl. On imaging ultrasound demonstrating a dilated CBD and 14 mm, cholelithiasis and sludge, but no other signs of cholecystitis.  CTA of the chest abdomen pelvis  demonstrates a distended gallbladder with question of gallbladder wall edema.  -General surgery Dr. Hershell Lose consulted and has seen the patient recommending for washout of DOAC possible cholecystectomy on Tuesday.  -Will go ahead and obtain MRI MRCP to rule out choledocholithiasis.  -Continue ceftriaxone 2 g IV every 24 hours, add Flagyl 500 mg IV every 12 hours -Hold home Xarelto preoperatively.  Will continue on aspirin -Trend  LFT -clear liquid diet for now  -See additional discussion regarding myocardial injury per per below.  Suspect this is demand in the setting of her underlying cholecystitis and management should be directed at this.  Do not feel she would benefit from any additional cardiac testing preoperatively.   Acute myocardial injury No history of chest pain.  EKG without ischemic changes.  High-sensitivity troponin flat 20 -> 23.  Suspect this is a demand event in the setting of her underlying cholecystitis. - Management directed at close stated above - Continue on home aspirin (for PAD) - Check lipids and A1c.  Groundglass opacities right middle lobe, lower lobe - Check flu/COVID/RSV  Incidental finding: Soft tissue anterior mediastinal mass, new from '22: Recommend interval CT and lab evaluation outpatient, referral to thoracic surgery for evaluation for need for biopsy. Pulmonary nodule right upper lobe 3-4 mm.  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  Continue aspirin daily, not on statin.Aaron Aas  Squamous cell carcinoma of cervix stage Ib: receiving chemoradiation, outpatient GYN -onc f/u Hypertension: Not on Antihypertensives    Body mass index is 36.81 kg/m.  Obesity class II-weight loss medication.  DVT prophylaxis:  SCDs Code Status:  Full Code Diet:  Diet Orders (From admission, onward)     Start     Ordered   01/15/24 2243  Diet clear liquid Room service appropriate? Yes; Fluid consistency: Thin  Diet effective now       Question Answer Comment  Room service appropriate? Yes   Fluid consistency: Thin      01/15/24 2243           Family Communication:  None   Consults:  Gen Surg   Admission status:   Inpatient, Telemetry bed  Severity of Illness: The appropriate patient status for  this patient is INPATIENT. Inpatient status is judged to be reasonable and necessary in order to provide the required intensity of service to ensure the patient's safety. The patient's presenting symptoms, physical exam findings, and initial radiographic and laboratory data in the context of their chronic comorbidities is felt to place them at high risk for further clinical deterioration. Furthermore, it is not anticipated that the patient will be medically stable for discharge from the hospital within 2 midnights of admission.   * I certify that at the point of admission it is my clinical judgment that the patient will require inpatient hospital care spanning beyond 2 midnights from the point of admission due to high intensity of service, high risk for further deterioration and high frequency of surveillance required.*   Arnulfo Larch, MD [facility]  How to contact the [facility] Attending or Consulting provider 7A - 7P or covering provider during after hours 7P -7A, for this patient.  Check the care team in [facility] and look for a) attending/consulting [facility] provider listed and b) the [facility] team listed Log into [email or web address] and use [place]'s universal password to access. If you do not have the  password, please contact the hospital operator. Locate the [facility] provider you are looking for under [facility] and page to a number that you can be directly reached. If you still have difficulty reaching the provider, please page the [facility] (Director on Call) for the Hospitalists listed on amion for assistance.  01/15/2024, 10:49 PM

## 2024-01-15 NOTE — Plan of Care (Signed)

## 2024-01-15 NOTE — ED Triage Notes (Signed)
 Patient comes in with chest pain vomiting and shortness of breath that started last night.  No medications taken today.  Mid chest pain that radiates to back

## 2024-01-15 NOTE — ED Provider Notes (Signed)
 [place] [facility]ERGENCY DEPARTMENT AT [facility] Provider Note   CSN: [id number] Arrival date & time: 01/15/24  1336     History  Chief Complaint  Patient presents with   Chest Pain   Emesis    Emma Medina is a 53 y.o. female.  Patient is a 53 year old female who presents with upper abdominal pain.  She has a history of chronic back pain and prior DVT on Xarelto.  She said over the last year she has had some intermittent pain in her epigastric area.  It radiates to her back and symptoms under her right rib cage.  It is generally worse after eating.  It also makes her feel constipated and that she needs to have a bowel movement but is not able to.  She said the pain got worse yesterday and has been constant since that time.  She denies any associated shortness of breath.  The pain does not radiate to other parts of her chest.  No known fevers.  She has had some associated nausea and vomiting.  Her emesis is mostly nonbloody but she has had some with specks of blood.       Home Medications Prior to Admission medications   Medication Sig Start Date End Date Taking? Authorizing Provider  aspirin EC 81 MG EC tablet Take 1 tablet (81 mg total) by mouth daily. 05/23/18  Yes Butch Cashing, PA-C  rivaroxaban (XARELTO) 20 MG TABS tablet Take 1 tablet (20 mg total) by mouth daily with supper. 01/06/24  Yes Adine Hoof, MD      Allergies    Patient has no known allergies.    Review of Systems   Review of Systems  Constitutional:  Negative for chills, diaphoresis, fatigue and fever.  HENT:  Negative for congestion, rhinorrhea and sneezing.   Eyes: Negative.   Respiratory:  Negative for cough, chest tightness and shortness of breath.   Cardiovascular:  Negative for chest pain and leg swelling.  Gastrointestinal:  Positive for abdominal pain, nausea and vomiting. Negative for blood in stool and diarrhea.  Genitourinary:  Negative for difficulty urinating, flank  pain, frequency and hematuria.  Musculoskeletal:  Negative for arthralgias and back pain.  Skin:  Negative for rash.  Neurological:  Negative for dizziness, speech difficulty, weakness, numbness and headaches.    Physical Exam Updated Vital Signs BP (!) 164/92 (BP Location: Right Arm)   Pulse 89   Temp 97.7 F (36.5 C) (Oral)   Resp 18   Ht 5\' 7"  (1.702 m)   Wt 106.6 kg   LMP 06/13/2019 Comment: radiation tx cervical ca  SpO2 99%   BMI 36.81 kg/m  Physical Exam Constitutional:      Appearance: She is well-developed.     Comments: Appears uncomfortable  HENT:     Head: Normocephalic and atraumatic.  Eyes:     Pupils: Pupils are equal, round, and reactive to light.  Cardiovascular:     Rate and Rhythm: Normal rate and regular rhythm.     Heart sounds: Normal heart sounds.  Pulmonary:     Effort: Pulmonary effort is normal. No respiratory distress.     Breath sounds: Normal breath sounds. No wheezing or rales.  Chest:     Chest wall: No tenderness.  Abdominal:     General: Bowel sounds are normal.     Palpations: Abdomen is soft.     Tenderness: There is abdominal tenderness (Tenderness to the epigastrium and right upper quadrant). There  is no guarding or rebound.  Musculoskeletal:        General: Normal range of motion.     Cervical back: Normal range of motion and neck supple.  Lymphadenopathy:     Cervical: No cervical adenopathy.  Skin:    General: Skin is warm and dry.     Findings: No rash.  Neurological:     Mental Status: She is alert and oriented to person, place, and time.     ED Results / Procedures / Treatments   Labs (all labs ordered are listed, but only abnormal results are displayed) Labs Reviewed  BASIC METABOLIC PANEL WITH GFR - Abnormal; Notable for the following components:      Result Value   Glucose, Bld 146 (*)    All other components within normal limits  CBC - Abnormal; Notable for the following components:   RBC 5.47 (*)    RDW 19.0  (*)    All other components within normal limits  HEPATIC FUNCTION PANEL - Abnormal; Notable for the following components:   ALT 55 (*)    Alkaline Phosphatase 168 (*)    All other components within normal limits  TROPONIN I (HIGH SENSITIVITY) - Abnormal; Notable for the following components:   Troponin I (High Sensitivity) 20 (*)    All other components within normal limits  TROPONIN I (HIGH SENSITIVITY) - Abnormal; Notable for the following components:   Troponin I (High Sensitivity) 23 (*)    All other components within normal limits  LIPASE, BLOOD  CBC  COMPREHENSIVE METABOLIC PANEL WITH GFR  LIPASE, BLOOD  MAGNESIUM  PHOSPHORUS  PREALBUMIN  HEMOGLOBIN A1C    EKG EKG Interpretation Date/Time:  Sunday January 15 2024 13:43:38 EDT Ventricular Rate:  52 PR Interval:  195 QRS Duration:  104 QT Interval:  529 QTC Calculation: 492 R Axis:   20  Text Interpretation: Sinus arrhythmia Low voltage, precordial leads Borderline prolonged QT interval Confirmed by Hershel Los [phone]) on 01/15/2024 3:01:39 PM  Radiology CT Angio Chest/Abd/Pel for Dissection W and/or W/WO Result Date: 01/15/2024 CLINICAL DATA:  Chest pain and vomiting with shortness of breath. History of squamous cell carcinoma of the cervix. EXAM: CT ANGIOGRAPHY CHEST, ABDOMEN AND PELVIS TECHNIQUE: Non-contrast CT of the chest was initially obtained. Multidetector CT imaging through the chest, abdomen and pelvis was performed using the standard protocol during bolus administration of intravenous contrast. Multiplanar reconstructed images and MIPs were obtained and reviewed to evaluate the vascular anatomy. RADIATION DOSE REDUCTION: This exam was performed according to the departmental dose-optimization program which includes automated exposure control, adjustment of the mA and/or kV according to patient size and/or use of iterative reconstruction technique. CONTRAST:  OMNIPAQUE IOHEXOL 350 MG/ML SOLN COMPARISON:   Ultrasound abdomen 01/15/2024. CT abdomen and pelvis 02/16/2023. PET CT 02/17/2021. FINDINGS: CTA CHEST FINDINGS Cardiovascular: Preferential opacification of the thoracic aorta. No evidence of thoracic aortic aneurysm or dissection. Normal heart size. No pericardial effusion. Mediastinum/Nodes: No enlarged mediastinal, hilar, or axillary lymph nodes. Thyroid gland, trachea, and esophagus demonstrate no significant findings. There is soft tissue in the upper anterior mediastinum which is new from 2022. Lungs/Pleura: There are minimal patchy ground-glass opacities in the inferior right middle lobe and right lower lobe. There is no pleural effusion or pneumothorax. There are few new pulmonary nodules in the right upper lobe measuring 3-4 mm. Musculoskeletal: No chest wall abnormality. No acute or significant osseous findings. Review of the MIP images confirms the above findings. CTA ABDOMEN AND PELVIS  FINDINGS VASCULAR Aorta: Normal caliber aorta without aneurysm, dissection, vasculitis or significant stenosis. Celiac: Patent without evidence of aneurysm, dissection, vasculitis or significant stenosis. SMA: Patent without evidence of aneurysm, dissection, vasculitis or significant stenosis. Renals: Both renal arteries are patent without evidence of aneurysm, dissection, vasculitis, fibromuscular dysplasia or significant stenosis. IMA: Patent without evidence of aneurysm, dissection, vasculitis or significant stenosis. Inflow: Patent without evidence of aneurysm, dissection, vasculitis or significant stenosis. Veins: No obvious venous abnormality within the limitations of this arterial phase study. Review of the MIP images confirms the above findings. NON-VASCULAR Hepatobiliary: Multiple gallstones are present. The gallbladder is dilated. There is some questionable mild gallbladder wall edema. There is no biliary ductal dilatation. No focal liver lesions are identified. There is no surrounding inflammation. Pancreas:  Unremarkable. No pancreatic ductal dilatation or surrounding inflammatory changes. Spleen: Normal in size without focal abnormality. Adrenals/Urinary Tract: Adrenal glands are unremarkable. Kidneys are normal, without renal calculi, focal lesion, or hydronephrosis. Bladder is unremarkable. Stomach/Bowel: Stomach is within normal limits. Appendix appears normal. No evidence of bowel wall thickening, distention, or inflammatory changes. Lymphatic: No enlarged lymph nodes are identified. There is some prominent inguinal lymph nodes bilaterally. Reproductive: Uterus and bilateral adnexa are unremarkable. Other: There is a small to moderate size fat containing umbilical hernia. There is no ascites. There is mild body wall edema. Musculoskeletal: Degenerative changes affect the spine. Review of the MIP images confirms the above findings. IMPRESSION: 1. No evidence for aortic dissection or aneurysm. 2. Cholelithiasis with dilated gallbladder and questionable mild gallbladder wall edema. Correlate clinically for acute cholecystitis. 3. Soft tissue in the upper anterior mediastinum is new from 2022. This could represent thymic hyperplasia, but other etiologies are not excluded. Recommend clinical correlation and follow-up. 4. New 3-4 mm pulmonary nodules in the right upper lobe, indeterminate. Follow-up recommended. 5. Mild ground-glass opacities in the inferior right middle lobe and right lower lobe which may represent infectious/inflammatory process. Electronically Signed   By: Tyron Gallon M.D.   On: 01/15/2024 18:40   US  Abdomen Limited RUQ (LIVER/GB) Result Date: 01/15/2024 CLINICAL DATA:  [id number] RUQ discomfort [id number] EXAM: ULTRASOUND ABDOMEN LIMITED RIGHT UPPER QUADRANT COMPARISON:  Feb 16, 2023 FINDINGS: Technically difficult exam due to patient's body habitus and overlying bowel gas. Gallbladder: Biliary sludge and multiple gallstones filling the gallbladder. No sonographic Murphy sign or wall thickening. No  pericholecystic fluid. Common bile duct: Diameter: 14 mm Liver: No focal lesion identified. Increased echogenicity. Portal vein is patent on color Doppler imaging with normal direction of blood flow towards the liver. Other: None. IMPRESSION: 1. Moderate dilation of the common bile duct measuring 14 mm. Correlation with serum bilirubin recommended. A nonemergent multiphase abdominal MRI with IV contrast is recommended to evaluate for downstream obstruction. 2. Cholecystolithiasis in biliary sludge in the gallbladder. No findings of acute cholecystitis. 3. Hepatic steatosis. Electronically Signed   By: Rance Burrows M.D.   On: 01/15/2024 16:24   DG Chest 2 View Result Date: 01/15/2024 CLINICAL DATA:  Chest pain EXAM: CHEST - 2 VIEW COMPARISON:  December 07, 2019 FINDINGS: Low lung volumes. No focal airspace consolidation, pleural effusion, or pneumothorax. No cardiomegaly. No acute fracture or destructive lesion. Multilevel thoracic osteophytosis. IMPRESSION: No acute cardiopulmonary abnormality. Electronically Signed   By: Rance Burrows M.D.   On: 01/15/2024 14:23    Procedures Procedures    Medications Ordered in ED Medications  lactated ringers bolus 1,000 mL (has no administration in time range)  lactated ringers bolus 1,000 mL (has  no administration in time range)  cefTRIAXone (ROCEPHIN) 2 g in sodium chloride 0.9 % 100 mL IVPB (has no administration in time range)  acetaminophen (TYLENOL) tablet 325-650 mg (has no administration in time range)  acetaminophen (TYLENOL) suppository 650 mg (has no administration in time range)  oxyCODONE (Oxy IR/ROXICODONE) immediate release tablet 5-10 mg (has no administration in time range)  HYDROmorphone (DILAUDID) injection 0.5-2 mg (has no administration in time range)  methocarbamol (ROBAXIN) injection 1,000 mg (has no administration in time range)  methocarbamol (ROBAXIN) tablet 1,000 mg (has no administration in time range)  ondansetron (ZOFRAN)  injection 4 mg (has no administration in time range)  prochlorperazine (COMPAZINE) injection 5-10 mg (has no administration in time range)  phenol (CHLORASEPTIC) mouth spray 2 spray (has no administration in time range)  menthol-cetylpyridinium (CEPACOL) lozenge 3 mg (has no administration in time range)  magic mouthwash (has no administration in time range)  alum & mag hydroxide-simeth (MAALOX/MYLANTA) 200-200-20 MG/5ML suspension 30 mL (has no administration in time range)  simethicone (MYLICON) 40 MG/0.6ML suspension 80 mg (has no administration in time range)  bisacodyl (DULCOLAX) suppository 10 mg (has no administration in time range)  polycarbophil (FIBERCON) tablet 625 mg (has no administration in time range)  naphazoline-glycerin (CLEAR EYES REDNESS) ophth solution 1-2 drop (has no administration in time range)  sodium chloride (OCEAN) 0.65 % nasal spray 1-2 spray (has no administration in time range)  morphine (PF) 4 MG/ML injection 4 mg (4 mg Intravenous Given 01/15/24 1524)  sodium chloride 0.9 % bolus 500 mL (0 mLs Intravenous Stopped 01/15/24 1619)  ondansetron (ZOFRAN) injection 4 mg (4 mg Intravenous Given 01/15/24 1524)  morphine (PF) 4 MG/ML injection 4 mg (4 mg Intravenous Given 01/15/24 1714)  ondansetron (ZOFRAN) injection 4 mg (4 mg Intravenous Given 01/15/24 1713)  iohexol (OMNIPAQUE) 350 MG/ML injection 100 mL (100 mLs Intravenous Contrast Given 01/15/24 1727)  HYDROmorphone (DILAUDID) injection 1 mg (1 mg Intravenous Given 01/15/24 1951)    ED Course/ Medical Decision Making/ A&P                                 Medical Decision Making Amount and/or Complexity of Data Reviewed Labs: ordered. Radiology: ordered.  Risk Prescription drug management.   Patient is a 53 year old who presents with upper abdominal pain associate with nausea and vomiting.  She was pretty uncomfortable on arrival.  Afebrile.  She was given pain medication and IV fluids as well as antiemetics.   Her initial blood work showed mild elevation in ALT.  Her AST is elevated.  Bilirubin is normal.  She is afebrile.  Her troponins were mildly elevated but flat.  She was still fairly uncomfortable and writhing around in the bed after pain medication.  Was given additional dose.  CTA of the chest abdomen pelvis was performed which shows no evidence of dissection or other acute abnormality.  It does show cholelithiasis with some mild gallbladder wall thickening.  I discussed the patient with Dr. Hershell Lose with general surgery.  He will consult on the patient and plan cholecystectomy once she is off anticoagulants.  Request medicine admission.  I spoke with Dr. Segars who will admit the patient for further treatment.  She was started on Rocephin.  Final Clinical Impression(s) / ED Diagnoses Final diagnoses:  Cholecystitis  Pulmonary nodule    Rx / DC Orders ED Discharge Orders     None  Hershel Los, MD 01/15/24 2050

## 2024-01-16 ENCOUNTER — Inpatient Hospital Stay (HOSPITAL_COMMUNITY)

## 2024-01-16 ENCOUNTER — Encounter (HOSPITAL_COMMUNITY): Payer: Self-pay | Admitting: Internal Medicine

## 2024-01-16 DIAGNOSIS — K8000 Calculus of gallbladder with acute cholecystitis without obstruction: Secondary | ICD-10-CM | POA: Diagnosis not present

## 2024-01-16 LAB — COMPREHENSIVE METABOLIC PANEL WITH GFR
ALT: 43 U/L (ref 0–44)
AST: 24 U/L (ref 15–41)
Albumin: 3.7 g/dL (ref 3.5–5.0)
Alkaline Phosphatase: 159 U/L — ABNORMAL HIGH (ref 38–126)
Anion gap: 9 (ref 5–15)
BUN: 9 mg/dL (ref 6–20)
CO2: 24 mmol/L (ref 22–32)
Calcium: 8.9 mg/dL (ref 8.9–10.3)
Chloride: 104 mmol/L (ref 98–111)
Creatinine, Ser: 0.62 mg/dL (ref 0.44–1.00)
GFR, Estimated: >60 mL/min (ref >60)
Glucose, Bld: 160 mg/dL — ABNORMAL HIGH (ref 70–99)
Potassium: 3.8 mmol/L (ref 3.5–5.1)
Sodium: 137 mmol/L (ref 135–145)
Total Bilirubin: 0.7 mg/dL (ref 0.0–1.2)
Total Protein: 7.2 g/dL (ref 6.5–8.1)

## 2024-01-16 LAB — CBC
HCT: 41.7 % (ref 36.0–46.0)
Hemoglobin: 13.2 g/dL (ref 12.0–15.0)
MCH: 25.9 pg — ABNORMAL LOW (ref 26.0–34.0)
MCHC: 31.7 g/dL (ref 30.0–36.0)
MCV: 81.8 fL (ref 80.0–100.0)
Platelets: 308 10*3/uL (ref 150–400)
RBC: 5.1 MIL/uL (ref 3.87–5.11)
RDW: 18.8 % — ABNORMAL HIGH (ref 11.5–15.5)
WBC: 10.1 10*3/uL (ref 4.0–10.5)
nRBC: 0 % (ref 0.0–0.2)

## 2024-01-16 LAB — LIPASE, BLOOD: Lipase: 27 U/L (ref 11–51)

## 2024-01-16 LAB — PROTIME-INR
INR: 1.2 (ref 0.8–1.2)
Prothrombin Time: 15.2 s (ref 11.4–15.2)

## 2024-01-16 LAB — LIPID PANEL
Cholesterol: 223 mg/dL — ABNORMAL HIGH (ref 0–200)
HDL: 82 mg/dL (ref >40)
LDL Cholesterol: 128 mg/dL — ABNORMAL HIGH (ref 0–99)
Total CHOL/HDL Ratio: 2.7 ratio
Triglycerides: 65 mg/dL (ref <150)
VLDL: 13 mg/dL (ref 0–40)

## 2024-01-16 LAB — PHOSPHORUS: Phosphorus: 3.8 mg/dL (ref 2.5–4.6)

## 2024-01-16 LAB — PREALBUMIN: Prealbumin: 19 mg/dL (ref 18–38)

## 2024-01-16 LAB — MAGNESIUM: Magnesium: 1.9 mg/dL (ref 1.7–2.4)

## 2024-01-16 LAB — HIV ANTIBODY (ROUTINE TESTING W REFLEX): HIV Screen 4th Generation wRfx: NONREACTIVE

## 2024-01-16 MED ORDER — GADOBUTROL 1 MMOL/ML IV SOLN
10.0000 mL | Freq: Once | INTRAVENOUS | Status: AC | PRN
  Administered 2024-01-16: 10 mL via INTRAVENOUS

## 2024-01-16 MED ORDER — INDOCYANINE GREEN 25 MG IV SOLR
1.2500 mg | Freq: Once | INTRAVENOUS | Status: AC
  Administered 2024-01-17: 1.25 mg via INTRAVENOUS
  Filled 2024-01-16: qty 10

## 2024-01-16 MED ORDER — HYDROMORPHONE HCL 1 MG/ML IJ SOLN
0.5000 mg | INTRAMUSCULAR | Status: DC | PRN
  Administered 2024-01-16 – 2024-01-17 (×3): 1 mg via INTRAVENOUS
  Filled 2024-01-16 (×4): qty 1

## 2024-01-16 NOTE — Plan of Care (Signed)

## 2024-01-16 NOTE — Progress Notes (Signed)
 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  [place] with dilated CBD and cholelithiasis but no frank cholecystitis.  CT with distended gallbladder, ?GB wall edema.  Surgery consulted and is planning cholecystectomy on 4/15.  MRCP is pending.  Treating with Ceftriaxone and metronidazole.  Xarelto is on hold.  Assessment and Plan:  Acute cholecystitis 1 year of intermittent pain consistent with biliary colic and 1 day of worsening now constant pain On initial evaluation she is afebrile, vitals normal, WBC 7. Alk phos 168, other LFT wnl On imaging, ultrasound demonstrating a dilated CBD and 14 mm, cholelithiasis and sludge, but no other signs of cholecystitis CTA of the chest abdomen pelvis demonstrates a distended gallbladder with question of gallbladder wall edema MRCP with acute cholecystitis, no choledocholithiasis General surgery Dr. Michaell Cowing consulted and has seen the patient recommending for washout of DOAC possible cholecystectomy on Tuesday (last dose of Xarelto on Saturday AM)  Continue ceftriaxone 2 g IV every 24 hours, add Flagyl 500 mg IV every 12 hours Hold home Xarelto preoperatively.  Will continue on aspirin Clear liquid diet for now, NPO after MN   Demand ischemia No history of chest pain EKG without ischemic changes High-sensitivity troponin flat 20 -> 23 Suspect this is a demand event in the setting of her underlying cholecystitis Continue on home aspirin (for PAD)   Groundglass opacities right middle lobe, lower lobe Asymptomatic   Incidental findings: Soft tissue anterior mediastinal mass, new from  '22 Recommend interval CT and lab evaluation outpatient, referral to thoracic surgery for evaluation for need for biopsy - discussed with patient on admission Pulmonary nodule right upper lobe 3-4 mm.  Recommend interval CT outpatient - discussed with patient on admission Hepatic steatosis: Weight loss, monitoring outpatient   Prior limb ischemia from arterial thromboembolism requiring thrombectomy in '19/ PAD Hold home Xarelto preoperatively Continue aspirin daily Not on statin but LDL is 128, likely needs - will defer to PCP  Squamous cell carcinoma of cervix stage Ib Receiving chemoradiation Outpatient GYN-onc f/u  Hypertension Not on Antihypertensives BP up to 171/112, currently 125/85 Consider addition of medication but currently could be situational/pain-related    Consultants: Surgery  Procedures: Cholecystectomy 4/15  Antibiotics: Ceftriaxone 4/14- Metronidazole 4/13-  30 Day Unplanned Readmission Risk Score    Flowsheet Row ED to Hosp-Admission (Current) from 01/15/2024 in [facility]  30 Day Unplanned Readmission Risk Score (%) 16.01 Filed at 01/16/2024 0801       This score is the patient's risk of an unplanned readmission within 30 days of being discharged (0 -100%). The score is based on dignosis, age, lab data, medications, orders, and past utilization.   Low:  0-14.9   Medium: 15-21.9   High: 22-29.9   Extreme: 30 and above           Subjective: Still having significant pain, although this has migrated from mid-epigastric to RUQ   Objective: Vitals:   01/16/24 0631 01/16/24 1045  BP: 122/64 125/85  Pulse: 73 76  Resp: 18   Temp: 98.2 F (36.8 C) 98.8 F (37.1 C)  SpO2: 98% 97%  Intake/Output Summary (Last 24 hours) at 01/16/2024 1347 Last data filed at 01/16/2024 0957 Gross per 24 hour  Intake 553 ml  Output --  Net 553 ml   Filed Weights   01/15/24 1346  Weight: 106.6 kg     Exam:  General:  Appears calm but uncomfortable and is in NAD Eyes:  EOMI, normal lids, iris ENT:  grossly normal hearing, lips & tongue, mmm Neck:  no LAD, masses or thyromegaly Cardiovascular:  RRR, no m/r/g. No LE edema.  Respiratory:   CTA bilaterally with no wheezes/rales/rhonchi.  Normal respiratory effort. Abdomen:  soft, diffusely TTP but worst in RUQ Skin:  no rash or induration seen on limited exam Musculoskeletal:  grossly normal tone BUE/BLE, good ROM, no bony abnormality Psychiatric:  grossly normal mood and affect, speech fluent and appropriate, AOx3 Neurologic:  CN 2-12 grossly intact, moves all extremities in coordinated fashion  Data Reviewed: I have reviewed the patient's lab results since admission.  Pertinent labs for today include:   Glucose 160 AP 159 WBC 10.1     Family Communication: None present  Disposition: Status is: Inpatient Remains inpatient appropriate because: awaiting surgery     Time spent: 50 minutes  Unresulted Labs (From admission, onward)     Start     Ordered   01/16/24 0500  CBC  Daily,   R      01/15/24 2020   01/16/24 0500  Comprehensive metabolic panel with GFR  Daily,   R      01/15/24 2020   01/15/24 2021  Hemoglobin A1c  Add-on,   AD        01/15/24 2020             Author: Lorita Rosa, MD 01/16/2024 1:47 PM  For on call review [email or web address].

## 2024-01-16 NOTE — Progress Notes (Signed)
 Transported off unit at this time via wheelchair with Imaging Services staff member for MRI.

## 2024-01-16 NOTE — H&P (View-Only) (Signed)
 Progress Note     Subjective: Ongoing abdominal pain but able to drink clear liquids   Objective: Vital signs in last 24 hours: Temp:  [97.7 F (36.5 C)-98.8 F (37.1 C)] 98.8 F (37.1 C) (04/14 1045) Pulse Rate:  [54-89] 76 (04/14 1045) Resp:  [17-18] 18 (04/14 0631) BP: (122-164)/(64-92) 125/85 (04/14 1045) SpO2:  [97 %-100 %] 97 % (04/14 1045) Last BM Date : 01/15/24  Intake/Output from previous day: 04/13 0701 - 04/14 0700 In: 450 [IV Piggyback:450] Out: -  Intake/Output this shift: Total I/O In: 343 [P.O.:240; I.V.:3; IV Piggyback:100] Out: -   PE: General: pleasant, WD, female who is laying in bed in NAD Lungs: Respiratory effort nonlabored on room air Abd: soft, ND, moderate TTP RUQ and R mid abdomen MSK: all 4 extremities are symmetrical with no cyanosis, clubbing, or edema. Skin: warm and dry with no masses, lesions, or rashes Neuro: Cranial nerves 2-12 grossly intact, sensation is normal throughout Psych: A&Ox3 with an appropriate affect.    Lab Results:  Recent Labs    01/15/24 1416 01/16/24 0539  WBC 7.8 10.1  HGB 14.2 13.2  HCT 44.4 41.7  PLT 322 308   BMET Recent Labs    01/15/24 1416 01/16/24 0539  NA 137 137  K 4.2 3.8  CL 104 104  CO2 23 24  GLUCOSE 146* 160*  BUN 9 9  CREATININE 0.78 0.62  CALCIUM 9.5 8.9   PT/INR Recent Labs    01/16/24 0539  LABPROT 15.2  INR 1.2   CMP     Component Value Date/Time   NA 137 01/16/2024 0539   NA 145 (H) 11/13/2019 1131   K 3.8 01/16/2024 0539   CL 104 01/16/2024 0539   CO2 24 01/16/2024 0539   GLUCOSE 160 (H) 01/16/2024 0539   BUN 9 01/16/2024 0539   BUN 8 11/13/2019 1131   CREATININE 0.62 01/16/2024 0539   CREATININE 0.83 10/01/2019 1221   CALCIUM 8.9 01/16/2024 0539   PROT 7.2 01/16/2024 0539   ALBUMIN 3.7 01/16/2024 0539   AST 24 01/16/2024 0539   ALT 43 01/16/2024 0539   ALKPHOS 159 (H) 01/16/2024 0539   BILITOT 0.7 01/16/2024 0539   GFRNONAA >60 01/16/2024 0539    GFRNONAA >60 10/01/2019 1221   GFRAA >60 12/07/2019 1225   GFRAA >60 10/01/2019 1221   Lipase     Component Value Date/Time   LIPASE 27 01/16/2024 0539       Studies/Results: MR 3D Recon At Scanner Result Date: 01/16/2024 CLINICAL DATA:  Nonspecific (abnormal) findings on radiological and other examination of musculoskeletal system. [id number] Pain [phone] Eval for Choledocholithiasis EXAM: 3-DIMENSIONAL MR IMAGE RENDERING ON ACQUISITION WORKSTATION TECHNIQUE: 3-dimensional MR images were rendered by post-processing of the original MR data on an acquisition workstation. The 3-dimensional MR images were interpreted and findings were reported in the accompanying complete MR report for this study COMPARISON:  None Available. FINDINGS: Please refer to simultaneously acquired but separately reported MRI abdomen report for details per IMPRESSION: 3-dimensional MRCP images were rendered from the original MR data. Electronically Signed   By: Jules Schick M.D.   On: 01/16/2024 08:57   MR ABDOMEN MRCP W WO CONTAST Result Date: 01/16/2024 CLINICAL DATA:  Eval for Choledocholithiasis. Chest pain and vomiting with shortness of breath. History of squamous cell carcinoma of the cervix. EXAM: MRI ABDOMEN WITHOUT AND WITH CONTRAST (INCLUDING MRCP) TECHNIQUE: Multiplanar multisequence MR imaging of the abdomen was performed both before and after the administration  of intravenous contrast. Heavily T2-weighted images of the biliary and pancreatic ducts were obtained, and three-dimensional MRCP images were rendered by post processing. CONTRAST:  10mL GADAVIST GADOBUTROL 1 MMOL/ML IV SOLN COMPARISON:  Ultrasound and CT scan chest, abdomen pelvis from yesterday-01/15/2024. FINDINGS: Lower chest: Unremarkable MR appearance to the lung bases. No pleural effusion. No pericardial effusion. Normal heart size. Hepatobiliary: The liver is mildly enlarged in size (length 17.4 cm). Noncirrhotic configuration. No focal lesion.  Moderate diffuse hepatic steatosis. No intra or extrahepatic bile duct dilation. Extrahepatic bile duct measures 4-6 mm in diameter. No choledocholithiasis. Markedly distended and dilated gallbladder measuring 18.8 cm in length and up to 5.6 cm in diameter. There is mild gallbladder wall thickening with moderate pericholecystic free fluid and mild to moderate pericholecystic fat stranding. Pancreas: No mass, inflammatory changes or other parenchymal abnormality identified. No main pancreatic duct dilation. Spleen:  Within normal limits in size and appearance. No focal mass. Adrenals/Urinary Tract: Unremarkable adrenal glands. No hydroureteronephrosis. No suspicious renal mass. Stomach/Bowel: Visualized portions within the abdomen are unremarkable. No disproportionate dilation of bowel loops. Vascular/Lymphatic: No pathologically enlarged lymph nodes identified. No abdominal aortic aneurysm demonstrated. There is trace perihepatic and perisplenic ascites. Other:  There is a small fat containing periumbilical hernia. Musculoskeletal: No suspicious bone lesions identified. IMPRESSION: 1. Findings compatible with acute cholecystitis. No choledocholithiasis or bile duct dilation. 2. Mild hepatomegaly with moderate diffuse hepatic steatosis. 3. Multiple other nonacute observations, as described above. Electronically Signed   By: Beula Brunswick M.D.   On: 01/16/2024 08:51   CT Angio Chest/Abd/Pel for Dissection W and/or W/WO Result Date: 01/15/2024 CLINICAL DATA:  Chest pain and vomiting with shortness of breath. History of squamous cell carcinoma of the cervix. EXAM: CT ANGIOGRAPHY CHEST, ABDOMEN AND PELVIS TECHNIQUE: Non-contrast CT of the chest was initially obtained. Multidetector CT imaging through the chest, abdomen and pelvis was performed using the standard protocol during bolus administration of intravenous contrast. Multiplanar reconstructed images and MIPs were obtained and reviewed to evaluate the vascular  anatomy. RADIATION DOSE REDUCTION: This exam was performed according to the departmental dose-optimization program which includes automated exposure control, adjustment of the mA and/or kV according to patient size and/or use of iterative reconstruction technique. CONTRAST:  OMNIPAQUE IOHEXOL 350 MG/ML SOLN COMPARISON:  Ultrasound abdomen 01/15/2024. CT abdomen and pelvis 02/16/2023. PET CT 02/17/2021. FINDINGS: CTA CHEST FINDINGS Cardiovascular: Preferential opacification of the thoracic aorta. No evidence of thoracic aortic aneurysm or dissection. Normal heart size. No pericardial effusion. Mediastinum/Nodes: No enlarged mediastinal, hilar, or axillary lymph nodes. Thyroid gland, trachea, and esophagus demonstrate no significant findings. There is soft tissue in the upper anterior mediastinum which is new from 2022. Lungs/Pleura: There are minimal patchy ground-glass opacities in the inferior right middle lobe and right lower lobe. There is no pleural effusion or pneumothorax. There are few new pulmonary nodules in the right upper lobe measuring 3-4 mm. Musculoskeletal: No chest wall abnormality. No acute or significant osseous findings. Review of the MIP images confirms the above findings. CTA ABDOMEN AND PELVIS FINDINGS VASCULAR Aorta: Normal caliber aorta without aneurysm, dissection, vasculitis or significant stenosis. Celiac: Patent without evidence of aneurysm, dissection, vasculitis or significant stenosis. SMA: Patent without evidence of aneurysm, dissection, vasculitis or significant stenosis. Renals: Both renal arteries are patent without evidence of aneurysm, dissection, vasculitis, fibromuscular dysplasia or significant stenosis. IMA: Patent without evidence of aneurysm, dissection, vasculitis or significant stenosis. Inflow: Patent without evidence of aneurysm, dissection, vasculitis or significant stenosis. Veins: No obvious  venous abnormality within the limitations of this arterial phase  study. Review of the MIP images confirms the above findings. NON-VASCULAR Hepatobiliary: Multiple gallstones are present. The gallbladder is dilated. There is some questionable mild gallbladder wall edema. There is no biliary ductal dilatation. No focal liver lesions are identified. There is no surrounding inflammation. Pancreas: Unremarkable. No pancreatic ductal dilatation or surrounding inflammatory changes. Spleen: Normal in size without focal abnormality. Adrenals/Urinary Tract: Adrenal glands are unremarkable. Kidneys are normal, without renal calculi, focal lesion, or hydronephrosis. Bladder is unremarkable. Stomach/Bowel: Stomach is within normal limits. Appendix appears normal. No evidence of bowel wall thickening, distention, or inflammatory changes. Lymphatic: No enlarged lymph nodes are identified. There is some prominent inguinal lymph nodes bilaterally. Reproductive: Uterus and bilateral adnexa are unremarkable. Other: There is a small to moderate size fat containing umbilical hernia. There is no ascites. There is mild body wall edema. Musculoskeletal: Degenerative changes affect the spine. Review of the MIP images confirms the above findings. IMPRESSION: 1. No evidence for aortic dissection or aneurysm. 2. Cholelithiasis with dilated gallbladder and questionable mild gallbladder wall edema. Correlate clinically for acute cholecystitis. 3. Soft tissue in the upper anterior mediastinum is new from 2022. This could represent thymic hyperplasia, but other etiologies are not excluded. Recommend clinical correlation and follow-up. 4. New 3-4 mm pulmonary nodules in the right upper lobe, indeterminate. Follow-up recommended. 5. Mild ground-glass opacities in the inferior right middle lobe and right lower lobe which may represent infectious/inflammatory process. Electronically Signed   By: Darliss Cheney M.D.   On: 01/15/2024 18:40   US Abdomen Limited RUQ (LIVER/GB) Result Date: 01/15/2024 CLINICAL DATA:   [id number] RUQ discomfort [id number] EXAM: ULTRASOUND ABDOMEN LIMITED RIGHT UPPER QUADRANT COMPARISON:  Feb 16, 2023 FINDINGS: Technically difficult exam due to patient's body habitus and overlying bowel gas. Gallbladder: Biliary sludge and multiple gallstones filling the gallbladder. No sonographic Murphy sign or wall thickening. No pericholecystic fluid. Common bile duct: Diameter: 14 mm Liver: No focal lesion identified. Increased echogenicity. Portal vein is patent on color Doppler imaging with normal direction of blood flow towards the liver. Other: None. IMPRESSION: 1. Moderate dilation of the common bile duct measuring 14 mm. Correlation with serum bilirubin recommended. A nonemergent multiphase abdominal MRI with IV contrast is recommended to evaluate for downstream obstruction. 2. Cholecystolithiasis in biliary sludge in the gallbladder. No findings of acute cholecystitis. 3. Hepatic steatosis. Electronically Signed   By: Wallie Char M.D.   On: 01/15/2024 16:24   DG Chest 2 View Result Date: 01/15/2024 CLINICAL DATA:  Chest pain EXAM: CHEST - 2 VIEW COMPARISON:  December 07, 2019 FINDINGS: Low lung volumes. No focal airspace consolidation, pleural effusion, or pneumothorax. No cardiomegaly. No acute fracture or destructive lesion. Multilevel thoracic osteophytosis. IMPRESSION: No acute cardiopulmonary abnormality. Electronically Signed   By: Wallie Char M.D.   On: 01/15/2024 14:23    Anti-infectives: Anti-infectives (From admission, onward)    Start     Dose/Rate Route Frequency Ordered Stop   01/16/24 2100  cefTRIAXone (ROCEPHIN) 2 g in sodium chloride 0.9 % 100 mL IVPB        2 g 200 mL/hr over 30 Minutes Intravenous Every 24 hours 01/15/24 2246     01/15/24 2300  metroNIDAZOLE (FLAGYL) IVPB 500 mg        500 mg 100 mL/hr over 60 Minutes Intravenous Every 12 hours 01/15/24 2246     01/15/24 2100  cefTRIAXone (ROCEPHIN) 2 g in sodium chloride 0.9 % 100 mL IVPB  Status:  Discontinued       Note  to Pharmacy: Pharmacy may adjust dosing strength / duration / interval for maximal efficacy   2 g 200 mL/hr over 30 Minutes Intravenous Daily-1800 01/15/24 2028 01/15/24 2246        Assessment/Plan  Acute cholecystitis    - LFTs improved. MRCP neg for choledoco and shows cholecystitis - continue CLD and IV abx - NPO MN for OR tomorrow  I have explained the procedure, risks, and aftercare of cholecystectomy.  Risks include but are not limited to bleeding, infection, wound problems, anesthesia, diarrhea, bile leak, injury to common bile duct/liver/intestine.  She seems to understand and agrees to proceed.   FEN: CLD, NPO MN ID: ceftriaxone/flagyl VTE: okay for lovenox or heparin gtt if indicated. Hep gtt will need to be held 6 hours prior to surgery  I reviewed hospitalist notes, last 24 h vitals and pain scores, last 48 h intake and output, last 24 h labs and trends, and last 24 h imaging results.      LOS: 1 day   Elwin Hammond, [facility] 01/16/2024, 3:27 PM Please see Amion for pager number during day hours 7:00am-4:30pm

## 2024-01-16 NOTE — Progress Notes (Signed)
   01/16/24 1515  Hand-off documentation  Hand-off Given Given to shift RN/LPN  Report given to (Full Name) Bonnell Butcher RN  Hand-off Received Received from shift RN/LPN  Report received from (Full Name) [facility] turned over for remainder of shift.

## 2024-01-16 NOTE — Progress Notes (Signed)
 Arrived back to unit after MRI at this time.  Placed back on telemetry monitoring.

## 2024-01-16 NOTE — Hospital Course (Signed)
 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  US  with dilated CBD and cholelithiasis but no frank cholecystitis.  CT with distended gallbladder, ?GB wall edema.  Surgery consulted and is planning cholecystectomy on 4/15.  MRCP is pending.  Treating with Ceftriaxone and metronidazole.  Xarelto is on hold.

## 2024-01-16 NOTE — Progress Notes (Addendum)
 Progress Note     Subjective: Ongoing abdominal pain but able to drink clear liquids   Objective: Vital signs in last 24 hours: Temp:  [97.7 F (36.5 C)-98.8 F (37.1 C)] 98.8 F (37.1 C) (04/14 1045) Pulse Rate:  [54-89] 76 (04/14 1045) Resp:  [17-18] 18 (04/14 0631) BP: (122-164)/(64-92) 125/85 (04/14 1045) SpO2:  [97 %-100 %] 97 % (04/14 1045) Last BM Date : 01/15/24  Intake/Output from previous day: 04/13 0701 - 04/14 0700 In: 450 [IV Piggyback:450] Out: -  Intake/Output this shift: Total I/O In: 343 [P.O.:240; I.V.:3; IV Piggyback:100] Out: -   PE: General: pleasant, WD, female who is laying in bed in NAD Lungs: Respiratory effort nonlabored on room air Abd: soft, ND, moderate TTP RUQ and R mid abdomen MSK: all 4 extremities are symmetrical with no cyanosis, clubbing, or edema. Skin: warm and dry with no masses, lesions, or rashes Neuro: Cranial nerves 2-12 grossly intact, sensation is normal throughout Psych: A&Ox3 with an appropriate affect.    Lab Results:  Recent Labs    01/15/24 1416 01/16/24 0539  WBC 7.8 10.1  HGB 14.2 13.2  HCT 44.4 41.7  PLT 322 308   BMET Recent Labs    01/15/24 1416 01/16/24 0539  NA 137 137  K 4.2 3.8  CL 104 104  CO2 23 24  GLUCOSE 146* 160*  BUN 9 9  CREATININE 0.78 0.62  CALCIUM 9.5 8.9   PT/INR Recent Labs    01/16/24 0539  LABPROT 15.2  INR 1.2   CMP     Component Value Date/Time   NA 137 01/16/2024 0539   NA 145 (H) 11/13/2019 1131   K 3.8 01/16/2024 0539   CL 104 01/16/2024 0539   CO2 24 01/16/2024 0539   GLUCOSE 160 (H) 01/16/2024 0539   BUN 9 01/16/2024 0539   BUN 8 11/13/2019 1131   CREATININE 0.62 01/16/2024 0539   CREATININE 0.83 10/01/2019 1221   CALCIUM 8.9 01/16/2024 0539   PROT 7.2 01/16/2024 0539   ALBUMIN 3.7 01/16/2024 0539   AST 24 01/16/2024 0539   ALT 43 01/16/2024 0539   ALKPHOS 159 (H) 01/16/2024 0539   BILITOT 0.7 01/16/2024 0539   GFRNONAA >60 01/16/2024 0539    GFRNONAA >60 10/01/2019 1221   GFRAA >60 12/07/2019 1225   GFRAA >60 10/01/2019 1221   Lipase     Component Value Date/Time   LIPASE 27 01/16/2024 0539       Studies/Results: MR 3D Recon At Scanner Result Date: 01/16/2024 CLINICAL DATA:  Nonspecific (abnormal) findings on radiological and other examination of musculoskeletal system. [id number] Pain [phone] Eval for Choledocholithiasis EXAM: 3-DIMENSIONAL MR IMAGE RENDERING ON ACQUISITION WORKSTATION TECHNIQUE: 3-dimensional MR images were rendered by post-processing of the original MR data on an acquisition workstation. The 3-dimensional MR images were interpreted and findings were reported in the accompanying complete MR report for this study COMPARISON:  None Available. FINDINGS: Please refer to simultaneously acquired but separately reported MRI abdomen report for details per IMPRESSION: 3-dimensional MRCP images were rendered from the original MR data. Electronically Signed   By: Jules Schick M.D.   On: 01/16/2024 08:57   MR ABDOMEN MRCP W WO CONTAST Result Date: 01/16/2024 CLINICAL DATA:  Eval for Choledocholithiasis. Chest pain and vomiting with shortness of breath. History of squamous cell carcinoma of the cervix. EXAM: MRI ABDOMEN WITHOUT AND WITH CONTRAST (INCLUDING MRCP) TECHNIQUE: Multiplanar multisequence MR imaging of the abdomen was performed both before and after the administration  of intravenous contrast. Heavily T2-weighted images of the biliary and pancreatic ducts were obtained, and three-dimensional MRCP images were rendered by post processing. CONTRAST:  10mL GADAVIST GADOBUTROL 1 MMOL/ML IV SOLN COMPARISON:  Ultrasound and CT scan chest, abdomen pelvis from yesterday-01/15/2024. FINDINGS: Lower chest: Unremarkable MR appearance to the lung bases. No pleural effusion. No pericardial effusion. Normal heart size. Hepatobiliary: The liver is mildly enlarged in size (length 17.4 cm). Noncirrhotic configuration. No focal lesion.  Moderate diffuse hepatic steatosis. No intra or extrahepatic bile duct dilation. Extrahepatic bile duct measures 4-6 mm in diameter. No choledocholithiasis. Markedly distended and dilated gallbladder measuring 18.8 cm in length and up to 5.6 cm in diameter. There is mild gallbladder wall thickening with moderate pericholecystic free fluid and mild to moderate pericholecystic fat stranding. Pancreas: No mass, inflammatory changes or other parenchymal abnormality identified. No main pancreatic duct dilation. Spleen:  Within normal limits in size and appearance. No focal mass. Adrenals/Urinary Tract: Unremarkable adrenal glands. No hydroureteronephrosis. No suspicious renal mass. Stomach/Bowel: Visualized portions within the abdomen are unremarkable. No disproportionate dilation of bowel loops. Vascular/Lymphatic: No pathologically enlarged lymph nodes identified. No abdominal aortic aneurysm demonstrated. There is trace perihepatic and perisplenic ascites. Other:  There is a small fat containing periumbilical hernia. Musculoskeletal: No suspicious bone lesions identified. IMPRESSION: 1. Findings compatible with acute cholecystitis. No choledocholithiasis or bile duct dilation. 2. Mild hepatomegaly with moderate diffuse hepatic steatosis. 3. Multiple other nonacute observations, as described above. Electronically Signed   By: Jules Schick M.D.   On: 01/16/2024 08:51   CT Angio Chest/Abd/Pel for Dissection W and/or W/WO Result Date: 01/15/2024 CLINICAL DATA:  Chest pain and vomiting with shortness of breath. History of squamous cell carcinoma of the cervix. EXAM: CT ANGIOGRAPHY CHEST, ABDOMEN AND PELVIS TECHNIQUE: Non-contrast CT of the chest was initially obtained. Multidetector CT imaging through the chest, abdomen and pelvis was performed using the standard protocol during bolus administration of intravenous contrast. Multiplanar reconstructed images and MIPs were obtained and reviewed to evaluate the vascular  anatomy. RADIATION DOSE REDUCTION: This exam was performed according to the departmental dose-optimization program which includes automated exposure control, adjustment of the mA and/or kV according to patient size and/or use of iterative reconstruction technique. CONTRAST:  OMNIPAQUE IOHEXOL 350 MG/ML SOLN COMPARISON:  Ultrasound abdomen 01/15/2024. CT abdomen and pelvis 02/16/2023. PET CT 02/17/2021. FINDINGS: CTA CHEST FINDINGS Cardiovascular: Preferential opacification of the thoracic aorta. No evidence of thoracic aortic aneurysm or dissection. Normal heart size. No pericardial effusion. Mediastinum/Nodes: No enlarged mediastinal, hilar, or axillary lymph nodes. Thyroid gland, trachea, and esophagus demonstrate no significant findings. There is soft tissue in the upper anterior mediastinum which is new from 2022. Lungs/Pleura: There are minimal patchy ground-glass opacities in the inferior right middle lobe and right lower lobe. There is no pleural effusion or pneumothorax. There are few new pulmonary nodules in the right upper lobe measuring 3-4 mm. Musculoskeletal: No chest wall abnormality. No acute or significant osseous findings. Review of the MIP images confirms the above findings. CTA ABDOMEN AND PELVIS FINDINGS VASCULAR Aorta: Normal caliber aorta without aneurysm, dissection, vasculitis or significant stenosis. Celiac: Patent without evidence of aneurysm, dissection, vasculitis or significant stenosis. SMA: Patent without evidence of aneurysm, dissection, vasculitis or significant stenosis. Renals: Both renal arteries are patent without evidence of aneurysm, dissection, vasculitis, fibromuscular dysplasia or significant stenosis. IMA: Patent without evidence of aneurysm, dissection, vasculitis or significant stenosis. Inflow: Patent without evidence of aneurysm, dissection, vasculitis or significant stenosis. Veins: No obvious  venous abnormality within the limitations of this arterial phase  study. Review of the MIP images confirms the above findings. NON-VASCULAR Hepatobiliary: Multiple gallstones are present. The gallbladder is dilated. There is some questionable mild gallbladder wall edema. There is no biliary ductal dilatation. No focal liver lesions are identified. There is no surrounding inflammation. Pancreas: Unremarkable. No pancreatic ductal dilatation or surrounding inflammatory changes. Spleen: Normal in size without focal abnormality. Adrenals/Urinary Tract: Adrenal glands are unremarkable. Kidneys are normal, without renal calculi, focal lesion, or hydronephrosis. Bladder is unremarkable. Stomach/Bowel: Stomach is within normal limits. Appendix appears normal. No evidence of bowel wall thickening, distention, or inflammatory changes. Lymphatic: No enlarged lymph nodes are identified. There is some prominent inguinal lymph nodes bilaterally. Reproductive: Uterus and bilateral adnexa are unremarkable. Other: There is a small to moderate size fat containing umbilical hernia. There is no ascites. There is mild body wall edema. Musculoskeletal: Degenerative changes affect the spine. Review of the MIP images confirms the above findings. IMPRESSION: 1. No evidence for aortic dissection or aneurysm. 2. Cholelithiasis with dilated gallbladder and questionable mild gallbladder wall edema. Correlate clinically for acute cholecystitis. 3. Soft tissue in the upper anterior mediastinum is new from 2022. This could represent thymic hyperplasia, but other etiologies are not excluded. Recommend clinical correlation and follow-up. 4. New 3-4 mm pulmonary nodules in the right upper lobe, indeterminate. Follow-up recommended. 5. Mild ground-glass opacities in the inferior right middle lobe and right lower lobe which may represent infectious/inflammatory process. Electronically Signed   By: Darliss Cheney M.D.   On: 01/15/2024 18:40   US Abdomen Limited RUQ (LIVER/GB) Result Date: 01/15/2024 CLINICAL DATA:   [id number] RUQ discomfort [id number] EXAM: ULTRASOUND ABDOMEN LIMITED RIGHT UPPER QUADRANT COMPARISON:  Feb 16, 2023 FINDINGS: Technically difficult exam due to patient's body habitus and overlying bowel gas. Gallbladder: Biliary sludge and multiple gallstones filling the gallbladder. No sonographic Murphy sign or wall thickening. No pericholecystic fluid. Common bile duct: Diameter: 14 mm Liver: No focal lesion identified. Increased echogenicity. Portal vein is patent on color Doppler imaging with normal direction of blood flow towards the liver. Other: None. IMPRESSION: 1. Moderate dilation of the common bile duct measuring 14 mm. Correlation with serum bilirubin recommended. A nonemergent multiphase abdominal MRI with IV contrast is recommended to evaluate for downstream obstruction. 2. Cholecystolithiasis in biliary sludge in the gallbladder. No findings of acute cholecystitis. 3. Hepatic steatosis. Electronically Signed   By: Wallie Char M.D.   On: 01/15/2024 16:24   DG Chest 2 View Result Date: 01/15/2024 CLINICAL DATA:  Chest pain EXAM: CHEST - 2 VIEW COMPARISON:  December 07, 2019 FINDINGS: Low lung volumes. No focal airspace consolidation, pleural effusion, or pneumothorax. No cardiomegaly. No acute fracture or destructive lesion. Multilevel thoracic osteophytosis. IMPRESSION: No acute cardiopulmonary abnormality. Electronically Signed   By: Wallie Char M.D.   On: 01/15/2024 14:23    Anti-infectives: Anti-infectives (From admission, onward)    Start     Dose/Rate Route Frequency Ordered Stop   01/16/24 2100  cefTRIAXone (ROCEPHIN) 2 g in sodium chloride 0.9 % 100 mL IVPB        2 g 200 mL/hr over 30 Minutes Intravenous Every 24 hours 01/15/24 2246     01/15/24 2300  metroNIDAZOLE (FLAGYL) IVPB 500 mg        500 mg 100 mL/hr over 60 Minutes Intravenous Every 12 hours 01/15/24 2246     01/15/24 2100  cefTRIAXone (ROCEPHIN) 2 g in sodium chloride 0.9 % 100 mL IVPB  Status:  Discontinued       Note  to Pharmacy: Pharmacy may adjust dosing strength / duration / interval for maximal efficacy   2 g 200 mL/hr over 30 Minutes Intravenous Daily-1800 01/15/24 2028 01/15/24 2246        Assessment/Plan  Acute cholecystitis    - LFTs improved. MRCP neg for choledoco and shows cholecystitis - continue CLD and IV abx - NPO MN for OR tomorrow  I have explained the procedure, risks, and aftercare of cholecystectomy.  Risks include but are not limited to bleeding, infection, wound problems, anesthesia, diarrhea, bile leak, injury to common bile duct/liver/intestine.  She seems to understand and agrees to proceed.   FEN: CLD, NPO MN ID: ceftriaxone/flagyl VTE: okay for lovenox or heparin gtt if indicated. Hep gtt will need to be held 6 hours prior to surgery  I reviewed hospitalist notes, last 24 h vitals and pain scores, last 48 h intake and output, last 24 h labs and trends, and last 24 h imaging results.      LOS: 1 day   Eric Form, [facility] 01/16/2024, 3:27 PM Please see Amion for pager number during day hours 7:00am-4:30pm

## 2024-01-17 ENCOUNTER — Encounter (HOSPITAL_COMMUNITY): Payer: Self-pay | Admitting: Internal Medicine

## 2024-01-17 ENCOUNTER — Encounter (HOSPITAL_COMMUNITY)
Admission: EM | Disposition: A | Discharge status: Home / Self Care | Payer: Self-pay | Source: Home / Self Care | Attending: Internal Medicine

## 2024-01-17 ENCOUNTER — Inpatient Hospital Stay (HOSPITAL_COMMUNITY): Payer: Self-pay | Admitting: Anesthesiology

## 2024-01-17 ENCOUNTER — Other Ambulatory Visit: Payer: Self-pay

## 2024-01-17 DIAGNOSIS — Z87891 Personal history of nicotine dependence: Secondary | ICD-10-CM | POA: Diagnosis not present

## 2024-01-17 DIAGNOSIS — K8010 Calculus of gallbladder with chronic cholecystitis without obstruction: Secondary | ICD-10-CM | POA: Diagnosis not present

## 2024-01-17 DIAGNOSIS — I10 Essential (primary) hypertension: Secondary | ICD-10-CM

## 2024-01-17 DIAGNOSIS — K8000 Calculus of gallbladder with acute cholecystitis without obstruction: Secondary | ICD-10-CM | POA: Diagnosis not present

## 2024-01-17 HISTORY — PX: CHOLECYSTECTOMY: SHX55

## 2024-01-17 LAB — COMPREHENSIVE METABOLIC PANEL WITH GFR
ALT: 31 U/L (ref 0–44)
AST: 14 U/L — ABNORMAL LOW (ref 15–41)
Albumin: 3.5 g/dL (ref 3.5–5.0)
Alkaline Phosphatase: 148 U/L — ABNORMAL HIGH (ref 38–126)
Anion gap: 12 (ref 5–15)
BUN: 9 mg/dL (ref 6–20)
CO2: 24 mmol/L (ref 22–32)
Calcium: 8.5 mg/dL — ABNORMAL LOW (ref 8.9–10.3)
Chloride: 101 mmol/L (ref 98–111)
Creatinine, Ser: 0.86 mg/dL (ref 0.44–1.00)
GFR, Estimated: >60 mL/min (ref >60)
Glucose, Bld: 143 mg/dL — ABNORMAL HIGH (ref 70–99)
Potassium: 3.9 mmol/L (ref 3.5–5.1)
Sodium: 137 mmol/L (ref 135–145)
Total Bilirubin: 1.1 mg/dL (ref 0.0–1.2)
Total Protein: 7.1 g/dL (ref 6.5–8.1)

## 2024-01-17 LAB — CBC
HCT: 44.1 % (ref 36.0–46.0)
Hemoglobin: 14 g/dL (ref 12.0–15.0)
MCH: 25.9 pg — ABNORMAL LOW (ref 26.0–34.0)
MCHC: 31.7 g/dL (ref 30.0–36.0)
MCV: 81.5 fL (ref 80.0–100.0)
Platelets: 295 10*3/uL (ref 150–400)
RBC: 5.41 MIL/uL — ABNORMAL HIGH (ref 3.87–5.11)
RDW: 19.6 % — ABNORMAL HIGH (ref 11.5–15.5)
WBC: 17.7 10*3/uL — ABNORMAL HIGH (ref 4.0–10.5)
nRBC: 0 % (ref 0.0–0.2)

## 2024-01-17 LAB — HEMOGLOBIN A1C
Hgb A1c MFr Bld: 6.5 % — ABNORMAL HIGH (ref 4.8–5.6)
Mean Plasma Glucose: 140 mg/dL

## 2024-01-17 SURGERY — LAPAROSCOPIC CHOLECYSTECTOMY
Anesthesia: General

## 2024-01-17 MED ORDER — MIDAZOLAM HCL 5 MG/5ML IJ SOLN
INTRAMUSCULAR | Status: DC | PRN
  Administered 2024-01-17: 2 mg via INTRAVENOUS

## 2024-01-17 MED ORDER — FENTANYL CITRATE PF 50 MCG/ML IJ SOSY: 50.0000 ug | PREFILLED_SYRINGE | INTRAMUSCULAR | Status: DC | PRN

## 2024-01-17 MED ORDER — ROCURONIUM BROMIDE 100 MG/10ML IV SOLN
INTRAVENOUS | Status: DC | PRN
  Administered 2024-01-17: 5 mg via INTRAVENOUS
  Administered 2024-01-17: 20 mg via INTRAVENOUS
  Administered 2024-01-17: 50 mg via INTRAVENOUS

## 2024-01-17 MED ORDER — PROPOFOL 10 MG/ML IV BOLUS
INTRAVENOUS | Status: DC | PRN
  Administered 2024-01-17: 140 mg via INTRAVENOUS

## 2024-01-17 MED ORDER — FENTANYL CITRATE (PF) 100 MCG/2ML IJ SOLN
INTRAMUSCULAR | Status: AC
  Filled 2024-01-17: qty 2

## 2024-01-17 MED ORDER — SODIUM CHLORIDE 0.9 % IV SOLN
2.0000 g | Freq: Every day | INTRAVENOUS | Status: AC
  Administered 2024-01-17: 2 g via INTRAVENOUS
  Filled 2024-01-17: qty 20

## 2024-01-17 MED ORDER — OXYCODONE HCL 5 MG PO TABS: 5.0000 mg | ORAL_TABLET | Freq: Once | ORAL | Status: DC | PRN

## 2024-01-17 MED ORDER — PROPOFOL 10 MG/ML IV BOLUS
INTRAVENOUS | Status: AC
  Filled 2024-01-17: qty 20

## 2024-01-17 MED ORDER — SODIUM CHLORIDE 0.9 % IV SOLN
INTRAVENOUS | Status: DC | PRN
  Administered 2024-01-17: 1000 mL

## 2024-01-17 MED ORDER — LACTATED RINGERS IR SOLN
Status: DC | PRN
Start: 2024-01-17 — End: 2024-01-17
  Administered 2024-01-17: 1000 mL

## 2024-01-17 MED ORDER — PANTOPRAZOLE SODIUM 40 MG IV SOLR
40.0000 mg | Freq: Every day | INTRAVENOUS | Status: DC
  Administered 2024-01-17 – 2024-01-18 (×2): 40 mg via INTRAVENOUS
  Filled 2024-01-17 (×2): qty 10

## 2024-01-17 MED ORDER — ONDANSETRON HCL 4 MG/2ML IJ SOLN
INTRAMUSCULAR | Status: AC
  Filled 2024-01-17: qty 2

## 2024-01-17 MED ORDER — HEMOSTATIC AGENTS (NO CHARGE) OPTIME
TOPICAL | Status: DC | PRN
  Administered 2024-01-17: 1
  Administered 2024-01-17: 1 via TOPICAL

## 2024-01-17 MED ORDER — SUGAMMADEX SODIUM 200 MG/2ML IV SOLN
INTRAVENOUS | Status: DC | PRN
Start: 2024-01-17 — End: 2024-01-17
  Administered 2024-01-17: 200 mg via INTRAVENOUS

## 2024-01-17 MED ORDER — DEXAMETHASONE SODIUM PHOSPHATE 10 MG/ML IJ SOLN
INTRAMUSCULAR | Status: AC
  Filled 2024-01-17: qty 1

## 2024-01-17 MED ORDER — BUPIVACAINE-EPINEPHRINE 0.25% -1:200000 IJ SOLN
INTRAMUSCULAR | Status: DC | PRN
  Administered 2024-01-17: 30 mL

## 2024-01-17 MED ORDER — SODIUM CHLORIDE 0.9 % IV SOLN
INTRAVENOUS | Status: DC
  Filled 2024-01-17: qty 6

## 2024-01-17 MED ORDER — OXYCODONE HCL 5 MG PO TABS
5.0000 mg | ORAL_TABLET | ORAL | Status: DC | PRN
  Administered 2024-01-17 – 2024-01-18 (×4): 10 mg via ORAL
  Filled 2024-01-17 (×4): qty 2

## 2024-01-17 MED ORDER — ROCURONIUM BROMIDE 10 MG/ML (PF) SYRINGE
PREFILLED_SYRINGE | INTRAVENOUS | Status: AC
  Filled 2024-01-17: qty 10

## 2024-01-17 MED ORDER — FENTANYL CITRATE PF 50 MCG/ML IJ SOSY: 25.0000 ug | PREFILLED_SYRINGE | INTRAMUSCULAR | Status: DC | PRN

## 2024-01-17 MED ORDER — ACETAMINOPHEN 500 MG PO TABS
1000.0000 mg | ORAL_TABLET | Freq: Four times a day (QID) | ORAL | Status: DC
  Administered 2024-01-17 – 2024-01-19 (×5): 1000 mg via ORAL
  Filled 2024-01-17 (×6): qty 2

## 2024-01-17 MED ORDER — PHENYLEPHRINE 80 MCG/ML (10ML) SYRINGE FOR IV PUSH (FOR BLOOD PRESSURE SUPPORT)
PREFILLED_SYRINGE | INTRAVENOUS | Status: DC | PRN
Start: 2024-01-17 — End: 2024-01-17
  Administered 2024-01-17: 160 ug via INTRAVENOUS

## 2024-01-17 MED ORDER — 0.9 % SODIUM CHLORIDE (POUR BTL) OPTIME
TOPICAL | Status: DC | PRN
Start: 2024-01-17 — End: 2024-01-17
  Administered 2024-01-17: 1000 mL

## 2024-01-17 MED ORDER — CHLORHEXIDINE GLUCONATE 0.12 % MT SOLN
15.0000 mL | Freq: Once | OROMUCOSAL | Status: AC
  Administered 2024-01-17: 15 mL via OROMUCOSAL

## 2024-01-17 MED ORDER — LIDOCAINE HCL (CARDIAC) PF 100 MG/5ML IV SOSY
PREFILLED_SYRINGE | INTRAVENOUS | Status: DC | PRN
  Administered 2024-01-17: 100 mg via INTRAVENOUS

## 2024-01-17 MED ORDER — LACTATED RINGERS IV SOLN: INTRAVENOUS | Status: DC

## 2024-01-17 MED ORDER — ONDANSETRON HCL 4 MG/2ML IJ SOLN
INTRAMUSCULAR | Status: DC | PRN
  Administered 2024-01-17: 4 mg via INTRAVENOUS

## 2024-01-17 MED ORDER — FENTANYL CITRATE PF 50 MCG/ML IJ SOSY
PREFILLED_SYRINGE | INTRAMUSCULAR | Status: AC
Start: 2024-01-17 — End: 2024-01-17
  Administered 2024-01-17: 50 ug via INTRAVENOUS
  Filled 2024-01-17: qty 1

## 2024-01-17 MED ORDER — MIDAZOLAM HCL 2 MG/2ML IJ SOLN
INTRAMUSCULAR | Status: AC
  Filled 2024-01-17: qty 2

## 2024-01-17 MED ORDER — MEPERIDINE HCL 50 MG/ML IJ SOLN: 6.2500 mg | INTRAMUSCULAR | Status: DC | PRN

## 2024-01-17 MED ORDER — OXYCODONE HCL 5 MG/5ML PO SOLN: 5.0000 mg | Freq: Once | ORAL | Status: DC | PRN

## 2024-01-17 MED ORDER — METRONIDAZOLE 500 MG/100ML IV SOLN
500.0000 mg | Freq: Two times a day (BID) | INTRAVENOUS | Status: AC
  Administered 2024-01-17 – 2024-01-18 (×2): 500 mg via INTRAVENOUS
  Filled 2024-01-17 (×2): qty 100

## 2024-01-17 MED ORDER — ONDANSETRON HCL 4 MG/2ML IJ SOLN: 4.0000 mg | Freq: Once | INTRAMUSCULAR | Status: DC | PRN

## 2024-01-17 MED ORDER — LACTATED RINGERS IV SOLN
INTRAVENOUS | Status: DC | PRN
Start: 2024-01-17 — End: 2024-01-17

## 2024-01-17 MED ORDER — LIDOCAINE HCL (PF) 2 % IJ SOLN
INTRAMUSCULAR | Status: AC
  Filled 2024-01-17: qty 5

## 2024-01-17 MED ORDER — PHENYLEPHRINE 80 MCG/ML (10ML) SYRINGE FOR IV PUSH (FOR BLOOD PRESSURE SUPPORT)
PREFILLED_SYRINGE | INTRAVENOUS | Status: AC
  Filled 2024-01-17: qty 10

## 2024-01-17 MED ORDER — LACTATED RINGERS IV SOLN: INTRAVENOUS | Status: AC

## 2024-01-17 MED ORDER — FENTANYL CITRATE (PF) 100 MCG/2ML IJ SOLN
INTRAMUSCULAR | Status: DC | PRN
  Administered 2024-01-17 (×2): 50 ug via INTRAVENOUS
  Administered 2024-01-17: 100 ug via INTRAVENOUS

## 2024-01-17 MED ORDER — SODIUM CHLORIDE 0.9 % IV SOLN
12.5000 mg | Freq: Four times a day (QID) | INTRAVENOUS | Status: DC | PRN
  Administered 2024-01-17: 12.5 mg via INTRAVENOUS
  Filled 2024-01-17: qty 12.5

## 2024-01-17 MED ORDER — BUPIVACAINE-EPINEPHRINE (PF) 0.25% -1:200000 IJ SOLN
INTRAMUSCULAR | Status: AC
  Filled 2024-01-17: qty 30

## 2024-01-17 MED ORDER — ONDANSETRON HCL 4 MG/2ML IJ SOLN
4.0000 mg | Freq: Four times a day (QID) | INTRAMUSCULAR | Status: DC | PRN
  Administered 2024-01-17 – 2024-01-18 (×2): 4 mg via INTRAVENOUS
  Filled 2024-01-17 (×2): qty 2

## 2024-01-17 MED ORDER — ENOXAPARIN SODIUM 40 MG/0.4ML IJ SOSY
40.0000 mg | PREFILLED_SYRINGE | INTRAMUSCULAR | Status: DC
  Administered 2024-01-18 – 2024-01-19 (×2): 40 mg via SUBCUTANEOUS
  Filled 2024-01-17 (×2): qty 0.4

## 2024-01-17 MED ORDER — HYDROMORPHONE HCL 2 MG/ML IJ SOLN
INTRAMUSCULAR | Status: AC
  Filled 2024-01-17: qty 1

## 2024-01-17 MED ORDER — ONDANSETRON HCL 4 MG/2ML IJ SOLN
4.0000 mg | Freq: Once | INTRAMUSCULAR | Status: AC
  Administered 2024-01-17: 4 mg via INTRAVENOUS

## 2024-01-17 MED ORDER — DEXAMETHASONE SODIUM PHOSPHATE 10 MG/ML IJ SOLN
INTRAMUSCULAR | Status: DC | PRN
  Administered 2024-01-17: 10 mg via INTRAVENOUS

## 2024-01-17 MED ORDER — HYDROMORPHONE HCL 1 MG/ML IJ SOLN
INTRAMUSCULAR | Status: DC | PRN
  Administered 2024-01-17 (×2): .5 mg via INTRAVENOUS

## 2024-01-17 SURGICAL SUPPLY — 58 items
APPLICATOR ARISTA FLEXITIP XL (MISCELLANEOUS) IMPLANT
APPLIER CLIP 5 13 M/L LIGAMAX5 (MISCELLANEOUS) IMPLANT
APPLIER CLIP ROT 10 11.4 M/L (STAPLE) IMPLANT
BAG COUNTER SPONGE SURGICOUNT (BAG) IMPLANT
BAG DECANTER FOR FLEXI CONT (MISCELLANEOUS) IMPLANT
CABLE HIGH FREQUENCY MONO STRZ (ELECTRODE) ×1 IMPLANT
CHLORAPREP W/TINT 26 (MISCELLANEOUS) ×1 IMPLANT
CLIP APPLIE 5 13 M/L LIGAMAX5 (MISCELLANEOUS) IMPLANT
CLIP APPLIE ROT 10 11.4 M/L (STAPLE) IMPLANT
CLIP LIGATING HEMO O LOK GREEN (MISCELLANEOUS) IMPLANT
COVER MAYO STAND XLG (MISCELLANEOUS) IMPLANT
COVER SURGICAL LIGHT HANDLE (MISCELLANEOUS) ×1 IMPLANT
DERMABOND ADVANCED .7 DNX12 (GAUZE/BANDAGES/DRESSINGS) IMPLANT
DRAIN CHANNEL 19F RND (DRAIN) IMPLANT
DRAPE C-ARM 42X120 X-RAY (DRAPES) IMPLANT
DRSG TEGADERM 2-3/8X2-3/4 SM (GAUZE/BANDAGES/DRESSINGS) ×3 IMPLANT
DRSG TEGADERM 4X4.75 (GAUZE/BANDAGES/DRESSINGS) ×1 IMPLANT
ELECT PENCIL ROCKER SW 15FT (MISCELLANEOUS) IMPLANT
ELECT REM PT RETURN 15FT ADLT (MISCELLANEOUS) ×1 IMPLANT
ENDOLOOP SUT PDS II 0 18 (SUTURE) IMPLANT
EVACUATOR SILICONE 100CC (DRAIN) IMPLANT
GAUZE SPONGE 2X2 8PLY STRL LF (GAUZE/BANDAGES/DRESSINGS) ×1 IMPLANT
GLOVE BIO SURGEON STRL SZ7.5 (GLOVE) ×1 IMPLANT
GLOVE INDICATOR 8.0 STRL GRN (GLOVE) ×1 IMPLANT
GOWN STRL REUS W/ TWL XL LVL3 (GOWN DISPOSABLE) ×1 IMPLANT
GRASPER SUT TROCAR 14GX15 (MISCELLANEOUS) IMPLANT
HEMOSTAT ARISTA ABSORB 3G PWDR (HEMOSTASIS) IMPLANT
HEMOSTAT SNOW SURGICEL 2X4 (HEMOSTASIS) IMPLANT
IRRIG SUCT STRYKERFLOW 2 WTIP (MISCELLANEOUS) ×1 IMPLANT
IRRIGATION SUCT STRKRFLW 2 WTP (MISCELLANEOUS) ×1 IMPLANT
KIT BASIN OR (CUSTOM PROCEDURE TRAY) ×1 IMPLANT
KIT TURNOVER KIT A (KITS) IMPLANT
L-HOOK LAP DISP 36CM (ELECTROSURGICAL) IMPLANT
LHOOK LAP DISP 36CM (ELECTROSURGICAL) IMPLANT
POUCH RETRIEVAL ECOSAC 10 (ENDOMECHANICALS) ×1 IMPLANT
POWDER SURGICEL 3.0 GRAM (HEMOSTASIS) IMPLANT
SCISSORS LAP 5X35 DISP (ENDOMECHANICALS) ×1 IMPLANT
SET CHOLANGIOGRAPH MIX (MISCELLANEOUS) IMPLANT
SET TUBE SMOKE EVAC HIGH FLOW (TUBING) ×1 IMPLANT
SLEEVE ADV FIXATION 5X100MM (TROCAR) ×1 IMPLANT
SPIKE FLUID TRANSFER (MISCELLANEOUS) ×1 IMPLANT
SPONGE DRAIN TRACH 4X4 STRL 2S (GAUZE/BANDAGES/DRESSINGS) IMPLANT
STRIP CLOSURE SKIN 1/2X4 (GAUZE/BANDAGES/DRESSINGS) ×1 IMPLANT
SUT ETHILON 2 0 PS N (SUTURE) IMPLANT
SUT MNCRL AB 4-0 PS2 18 (SUTURE) ×1 IMPLANT
SUT NOVA 1 T20/GS 25DT (SUTURE) IMPLANT
SUT VIC AB 0 UR5 27 (SUTURE) IMPLANT
SUT VIC AB 3-0 SH 27XBRD (SUTURE) IMPLANT
SUT VICRYL 0 TIES 12 18 (SUTURE) IMPLANT
SUT VICRYL 0 UR6 27IN ABS (SUTURE) IMPLANT
SYR BULB IRRIG 60ML STRL (SYRINGE) IMPLANT
TIP ENDOSCOPIC SURGICEL (TIP) IMPLANT
TOWEL OR 17X26 10 PK STRL BLUE (TOWEL DISPOSABLE) ×1 IMPLANT
TRAY LAPAROSCOPIC (CUSTOM PROCEDURE TRAY) ×1 IMPLANT
TROCAR ADV FIXATION 12X100MM (TROCAR) IMPLANT
TROCAR ADV FIXATION 5X100MM (TROCAR) ×1 IMPLANT
TROCAR BALLN 12MMX100 BLUNT (TROCAR) IMPLANT
TROCAR XCEL NON-BLD 5MMX100MML (ENDOMECHANICALS) IMPLANT

## 2024-01-17 NOTE — Plan of Care (Signed)
 Problem: Education: Goal: Knowledge of General Education information will improve Description: Including pain rating scale, medication(s)/side effects and non-pharmacologic comfort measures 01/17/2024 1903 by Londell River, RN Outcome: Progressing 01/17/2024 1903 by Londell River, RN Outcome: Progressing 01/17/2024 1902 by Londell River, RN Outcome: Progressing 01/17/2024 1901 by Londell River, RN Outcome: Progressing   Problem: Health Behavior/Discharge Planning: Goal: Ability to manage health-related needs will improve 01/17/2024 1903 by Londell River, RN Outcome: Progressing 01/17/2024 1903 by Londell River, RN Outcome: Progressing 01/17/2024 1902 by Londell River, RN Outcome: Progressing 01/17/2024 1901 by Londell River, RN Outcome: Progressing   Problem: Clinical Measurements: Goal: Ability to maintain clinical measurements within normal limits will improve 01/17/2024 1903 by Londell River, RN Outcome: Progressing 01/17/2024 1903 by Londell River, RN Outcome: Progressing 01/17/2024 1902 by Londell River, RN Outcome: Progressing 01/17/2024 1901 by Londell River, RN Outcome: Progressing Goal: Will remain free from infection 01/17/2024 1903 by Londell River, RN Outcome: Progressing 01/17/2024 1903 by Londell River, RN Outcome: Progressing 01/17/2024 1902 by Londell River, RN Outcome: Progressing 01/17/2024 1901 by Londell River, RN Outcome: Progressing Goal: Diagnostic test results will improve 01/17/2024 1903 by Londell River, RN Outcome: Progressing 01/17/2024 1903 by Londell River, RN Outcome: Progressing 01/17/2024 1902 by Londell River, RN Outcome: Progressing 01/17/2024 1901 by Londell River, RN Outcome: Progressing Goal: Respiratory complications will improve 01/17/2024 1903 by Londell River, RN Outcome: Progressing 01/17/2024 1903 by Londell River, RN Outcome:  Progressing 01/17/2024 1902 by Londell River, RN Outcome: Progressing 01/17/2024 1901 by Londell River, RN Outcome: Progressing Goal: Cardiovascular complication will be avoided 01/17/2024 1903 by Londell River, RN Outcome: Progressing 01/17/2024 1903 by Londell River, RN Outcome: Progressing 01/17/2024 1902 by Londell River, RN Outcome: Progressing 01/17/2024 1901 by Londell River, RN Outcome: Progressing   Problem: Activity: Goal: Risk for activity intolerance will decrease 01/17/2024 1903 by Londell River, RN Outcome: Progressing 01/17/2024 1903 by Londell River, RN Outcome: Progressing 01/17/2024 1902 by Londell River, RN Outcome: Progressing 01/17/2024 1901 by Londell River, RN Outcome: Progressing   Problem: Nutrition: Goal: Adequate nutrition will be maintained 01/17/2024 1903 by Londell River, RN Outcome: Progressing 01/17/2024 1903 by Londell River, RN Outcome: Progressing 01/17/2024 1902 by Londell River, RN Outcome: Progressing 01/17/2024 1901 by Londell River, RN Outcome: Progressing   Problem: Coping: Goal: Level of anxiety will decrease 01/17/2024 1903 by Londell River, RN Outcome: Progressing 01/17/2024 1903 by Londell River, RN Outcome: Progressing 01/17/2024 1902 by Londell River, RN Outcome: Progressing 01/17/2024 1901 by Londell River, RN Outcome: Progressing   Problem: Elimination: Goal: Will not experience complications related to bowel motility 01/17/2024 1903 by Londell River, RN Outcome: Progressing 01/17/2024 1903 by Londell River, RN Outcome: Progressing 01/17/2024 1902 by Londell River, RN Outcome: Progressing 01/17/2024 1901 by Londell River, RN Outcome: Progressing Goal: Will not experience complications related to urinary retention 01/17/2024 1903 by Londell River, RN Outcome: Progressing 01/17/2024 1903 by Londell River,  RN Outcome: Progressing 01/17/2024 1902 by Londell River, RN Outcome: Progressing 01/17/2024 1901 by Londell River, RN Outcome: Progressing   Problem: Pain Managment: Goal: General experience of comfort will improve and/or be controlled 01/17/2024 1903 by Londell River, RN Outcome: Progressing 01/17/2024 1903 by Londell River, RN Outcome: Progressing 01/17/2024 1902 by Londell River,  RN Outcome: Progressing 01/17/2024 1901 by Londell River, RN Outcome: Progressing   Problem: Safety: Goal: Ability to remain free from injury will improve 01/17/2024 1903 by Londell River, RN Outcome: Progressing 01/17/2024 1903 by Londell River, RN Outcome: Progressing 01/17/2024 1902 by Londell River, RN Outcome: Progressing 01/17/2024 1901 by Londell River, RN Outcome: Progressing   Problem: Skin Integrity: Goal: Risk for impaired skin integrity will decrease 01/17/2024 1903 by Londell River, RN Outcome: Progressing 01/17/2024 1903 by Londell River, RN Outcome: Progressing 01/17/2024 1902 by Londell River, RN Outcome: Progressing 01/17/2024 1901 by Londell River, RN Outcome: Progressing

## 2024-01-17 NOTE — Plan of Care (Signed)
  Problem: Education: Goal: Knowledge of General Education information will improve Description: Including pain rating scale, medication(s)/side effects and non-pharmacologic comfort measures 01/17/2024 1902 by Londell River, RN Outcome: Progressing 01/17/2024 1901 by Londell River, RN Outcome: Progressing   Problem: Health Behavior/Discharge Planning: Goal: Ability to manage health-related needs will improve 01/17/2024 1902 by Londell River, RN Outcome: Progressing 01/17/2024 1901 by Londell River, RN Outcome: Progressing   Problem: Clinical Measurements: Goal: Ability to maintain clinical measurements within normal limits will improve 01/17/2024 1902 by Londell River, RN Outcome: Progressing 01/17/2024 1901 by Londell River, RN Outcome: Progressing Goal: Will remain free from infection 01/17/2024 1902 by Londell River, RN Outcome: Progressing 01/17/2024 1901 by Londell River, RN Outcome: Progressing Goal: Diagnostic test results will improve 01/17/2024 1902 by Londell River, RN Outcome: Progressing 01/17/2024 1901 by Londell River, RN Outcome: Progressing Goal: Respiratory complications will improve 01/17/2024 1902 by Londell River, RN Outcome: Progressing 01/17/2024 1901 by Londell River, RN Outcome: Progressing Goal: Cardiovascular complication will be avoided 01/17/2024 1902 by Londell River, RN Outcome: Progressing 01/17/2024 1901 by Londell River, RN Outcome: Progressing   Problem: Activity: Goal: Risk for activity intolerance will decrease 01/17/2024 1902 by Londell River, RN Outcome: Progressing 01/17/2024 1901 by Londell River, RN Outcome: Progressing   Problem: Nutrition: Goal: Adequate nutrition will be maintained 01/17/2024 1902 by Londell River, RN Outcome: Progressing 01/17/2024 1901 by Londell River, RN Outcome: Progressing   Problem: Coping: Goal: Level of anxiety will  decrease 01/17/2024 1902 by Londell River, RN Outcome: Progressing 01/17/2024 1901 by Londell River, RN Outcome: Progressing   Problem: Elimination: Goal: Will not experience complications related to bowel motility 01/17/2024 1902 by Londell River, RN Outcome: Progressing 01/17/2024 1901 by Londell River, RN Outcome: Progressing Goal: Will not experience complications related to urinary retention 01/17/2024 1902 by Londell River, RN Outcome: Progressing 01/17/2024 1901 by Londell River, RN Outcome: Progressing   Problem: Pain Managment: Goal: General experience of comfort will improve and/or be controlled 01/17/2024 1902 by Londell River, RN Outcome: Progressing 01/17/2024 1901 by Londell River, RN Outcome: Progressing   Problem: Safety: Goal: Ability to remain free from injury will improve 01/17/2024 1902 by Londell River, RN Outcome: Progressing 01/17/2024 1901 by Londell River, RN Outcome: Progressing   Problem: Skin Integrity: Goal: Risk for impaired skin integrity will decrease 01/17/2024 1902 by Londell River, RN Outcome: Progressing 01/17/2024 1901 by Londell River, RN Outcome: Progressing

## 2024-01-17 NOTE — Transfer of Care (Signed)
 Immediate Anesthesia Transfer of Care Note  Patient: SILVERIA BOTZ  Procedure(s) Performed: LAPAROSCOPIC CHOLECYSTECTOMY, OPEN PRIMARY UMBILICAL HERNIA REPAIR INDOCYANINE GREEN FLUORESCENCE IMAGING (ICG)  Patient Location: PACU  Anesthesia Type:General  Level of Consciousness: awake and alert   Airway & Oxygen Therapy: Patient Spontanous Breathing and Patient connected to face mask oxygen  Post-op Assessment: Report given to RN and Post -op Vital signs reviewed and stable  Post vital signs: Reviewed and stable  Last Vitals:  Vitals Value Taken Time  BP 141/84 01/17/24 1301  Temp    Pulse 90 01/17/24 1304  Resp 30 01/17/24 1304  SpO2 97 % 01/17/24 1304  Vitals shown include unfiled device data.  Last Pain:  Vitals:   01/17/24 0900  TempSrc:   PainSc: 9       Patients Stated Pain Goal: 5 (01/17/24 0837)  Complications: No notable events documented.

## 2024-01-17 NOTE — Progress Notes (Signed)
 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  [place] with dilated CBD and cholelithiasis but no frank cholecystitis.  CT with distended gallbladder, ?GB wall edema.  Surgery consulted and is planning cholecystectomy on 4/15.  MRCP is pending.  Treating with Ceftriaxone and metronidazole.  Xarelto is on hold.  Assessment and Plan:  Acute cholecystitis 1 year of intermittent pain consistent with biliary colic and 1 day of worsening now constant pain On imaging, dilated CBD, cholelithiasis, acute cholecystitis, no choledocholithiasis Continue ceftriaxone 2 g IV every 24 hours, add Flagyl 500 mg IV every 12 hours Hold home Xarelto preoperatively.  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  Recommend interval CT outpatient - discussed with patient on admission Hepatic steatosis: Weight loss, monitoring  outpatient   Prior limb ischemia from arterial thromboembolism requiring thrombectomy in '19/ PAD Hold home Xarelto preoperatively Continue aspirin daily Not on statin but LDL is 128, likely needs - will defer to PCP   Squamous cell carcinoma of cervix stage Ib Receiving chemoradiation Outpatient GYN-onc f/u   Hypertension Not on Antihypertensives BP up to 171/112, currently 125/85 Consider addition of medication but currently could be situational/pain-related       Consultants: Surgery   Procedures: Cholecystectomy 4/15   Antibiotics: Ceftriaxone 4/14- Metronidazole 4/13-  30 Day Unplanned Readmission Risk Score    Flowsheet Row ED to Hosp-Admission (Current) from 01/15/2024 in [place] PERIOPERATIVE AREA  30 Day Unplanned Readmission Risk Score (%) 18.63 Filed at 01/17/2024 0801       This score is the patient's risk of an unplanned readmission within 30 days of being discharged (0 -100%). The score is based on dignosis, age, lab data, medications, orders, and past utilization.   Low:  0-14.9   Medium: 15-21.9   High: 22-29.9   Extreme: 30 and above           Subjective: Seen in PACU, patient very confused and disoriented post-operatively but without apparent pain.   Objective: Vitals:   01/17/24 1345 01/17/24 1400  BP: 131/86 125/87  Pulse: 88 84  Resp: (!) 27 (!) 24  Temp:    SpO2: 93% 93%    Intake/Output Summary (Last 24 hours) at 01/17/2024 1500 Last data filed at 01/17/2024 1304 Gross per 24 hour  Intake 2360.67 ml  Output 50 ml  Net 2310.67 ml   Filed Weights   01/15/24 1346 01/17/24 0837  Weight: 106.6 kg 106.6 kg    Exam:  General:  Appears calm but uncomfortable and is in NAD Eyes:  EOMI, normal lids, iris ENT:  grossly normal hearing, lips & tongue, mmm Neck:  no LAD, masses or thyromegaly Cardiovascular:  RRR, no m/r/g. No LE edema.  Respiratory:   CTA bilaterally with no wheezes/rales/rhonchi.  Normal respiratory  effort. Abdomen:  well approximated laparoscopic surgical scars with drain in RUQ Skin:  no rash or induration seen on limited exam Musculoskeletal:  grossly normal tone BUE/BLE,  no bony abnormality Psychiatric:  confused mood and affect, speech sparse Neurologic:  CN 2-12 grossly intact, moves all extremities in coordinated fashion  Data Reviewed: I have reviewed the patient's lab results since admission.  Pertinent labs for today include:   Glucose 143 WBC 17.7     Family Communication: None present  Disposition: Status is: Inpatient Remains inpatient appropriate because: post-operative care     Time spent: 35 minutes  Unresulted Labs (From admission, onward)     Start     Ordered   01/16/24 0500  CBC  Daily,   R      01/15/24 2020   01/16/24 0500  Comprehensive metabolic panel with GFR  Daily,   R      01/15/24 2020   Signed and Held  CBC  Daily,   R      Signed and Held   Signed and Held  Comprehensive metabolic panel with GFR  Daily,   R      Signed and Held   Signed and Held  Creatinine, serum  (enoxaparin (LOVENOX)  CrCl >/= 30 mL/min  )  Weekly,   R     Comments: while on enoxaparin therapy.    Signed and Held             Author: Lorita Rosa, MD 01/17/2024 3:00 PM  For on call review [email or web address].

## 2024-01-17 NOTE — Plan of Care (Signed)
  Problem: Education: Goal: Knowledge of General Education information will improve Description: Including pain rating scale, medication(s)/side effects and non-pharmacologic comfort measures 01/17/2024 1903 by Drema Dallas, RN Outcome: Progressing 01/17/2024 1902 by Drema Dallas, RN Outcome: Progressing 01/17/2024 1901 by Drema Dallas, RN Outcome: Progressing   Problem: Health Behavior/Discharge Planning: Goal: Ability to manage health-related needs will improve 01/17/2024 1903 by Drema Dallas, RN Outcome: Progressing 01/17/2024 1902 by Drema Dallas, RN Outcome: Progressing 01/17/2024 1901 by Drema Dallas, RN Outcome: Progressing   Problem: Clinical Measurements: Goal: Ability to maintain clinical measurements within normal limits will improve 01/17/2024 1903 by Drema Dallas, RN Outcome: Progressing 01/17/2024 1902 by Drema Dallas, RN Outcome: Progressing 01/17/2024 1901 by Drema Dallas, RN Outcome: Progressing Goal: Will remain free from infection 01/17/2024 1903 by Drema Dallas, RN Outcome: Progressing 01/17/2024 1902 by Drema Dallas, RN Outcome: Progressing 01/17/2024 1901 by Drema Dallas, RN Outcome: Progressing Goal: Diagnostic test results will improve 01/17/2024 1903 by Drema Dallas, RN Outcome: Progressing 01/17/2024 1902 by Drema Dallas, RN Outcome: Progressing 01/17/2024 1901 by Drema Dallas, RN Outcome: Progressing Goal: Respiratory complications will improve 01/17/2024 1903 by Drema Dallas, RN Outcome: Progressing 01/17/2024 1902 by Drema Dallas, RN Outcome: Progressing 01/17/2024 1901 by Drema Dallas, RN Outcome: Progressing Goal: Cardiovascular complication will be avoided 01/17/2024 1903 by Drema Dallas, RN Outcome: Progressing 01/17/2024 1902 by Drema Dallas, RN Outcome: Progressing 01/17/2024 1901 by Drema Dallas, RN Outcome: Progressing    Problem: Activity: Goal: Risk for activity intolerance will decrease 01/17/2024 1903 by Drema Dallas, RN Outcome: Progressing 01/17/2024 1902 by Drema Dallas, RN Outcome: Progressing 01/17/2024 1901 by Drema Dallas, RN Outcome: Progressing   Problem: Nutrition: Goal: Adequate nutrition will be maintained 01/17/2024 1903 by Drema Dallas, RN Outcome: Progressing 01/17/2024 1902 by Drema Dallas, RN Outcome: Progressing 01/17/2024 1901 by Drema Dallas, RN Outcome: Progressing   Problem: Coping: Goal: Level of anxiety will decrease 01/17/2024 1903 by Drema Dallas, RN Outcome: Progressing 01/17/2024 1902 by Drema Dallas, RN Outcome: Progressing 01/17/2024 1901 by Drema Dallas, RN Outcome: Progressing   Problem: Elimination: Goal: Will not experience complications related to bowel motility 01/17/2024 1903 by Drema Dallas, RN Outcome: Progressing 01/17/2024 1902 by Drema Dallas, RN Outcome: Progressing 01/17/2024 1901 by Drema Dallas, RN Outcome: Progressing Goal: Will not experience complications related to urinary retention 01/17/2024 1903 by Drema Dallas, RN Outcome: Progressing 01/17/2024 1902 by Drema Dallas, RN Outcome: Progressing 01/17/2024 1901 by Drema Dallas, RN Outcome: Progressing   Problem: Pain Managment: Goal: General experience of comfort will improve and/or be controlled 01/17/2024 1903 by Drema Dallas, RN Outcome: Progressing 01/17/2024 1902 by Drema Dallas, RN Outcome: Progressing 01/17/2024 1901 by Drema Dallas, RN Outcome: Progressing   Problem: Safety: Goal: Ability to remain free from injury will improve 01/17/2024 1903 by Drema Dallas, RN Outcome: Progressing 01/17/2024 1902 by Drema Dallas, RN Outcome: Progressing 01/17/2024 1901 by Drema Dallas, RN Outcome: Progressing   Problem: Skin Integrity: Goal: Risk for impaired skin  integrity will decrease 01/17/2024 1903 by Drema Dallas, RN Outcome: Progressing 01/17/2024 1902 by Drema Dallas, RN Outcome: Progressing 01/17/2024 1901 by Drema Dallas, RN Outcome: Progressing

## 2024-01-17 NOTE — Anesthesia Procedure Notes (Signed)
 Procedure Name: Intubation Date/Time: 01/17/2024 9:41 AM  Performed by: Josetta Niece, CRNAPre-anesthesia Checklist: Patient identified, Emergency Drugs available, Suction available and Patient being monitored Patient Re-evaluated:Patient Re-evaluated prior to induction Oxygen Delivery Method: Circle System Utilized Preoxygenation: Pre-oxygenation with 100% oxygen Induction Type: IV induction Ventilation: Mask ventilation without difficulty Laryngoscope Size: Mac and 3 Grade View: Grade I Tube type: Oral Tube size: 7.0 mm Number of attempts: 1 Airway Equipment and Method: Stylet Placement Confirmation: ETT inserted through vocal cords under direct vision, positive ETCO2 and breath sounds checked- equal and bilateral Secured at: 20 cm Tube secured with: Tape Dental Injury: Teeth and Oropharynx as per pre-operative assessment

## 2024-01-17 NOTE — Interval H&P Note (Signed)
 History and Physical Interval Note:  01/17/2024 9:00 AM  Emma Medina  has presented today for surgery, with the diagnosis of acute cholecystitis.  The various methods of treatment have been discussed with the patient and family. After consideration of risks, benefits and other options for treatment, the patient has consented to  Procedure(s): LAPAROSCOPIC CHOLECYSTECTOMY (N/A) INDOCYANINE GREEN FLUORESCENCE IMAGING (ICG) (N/A) as a surgical intervention.  The patient's history has been reviewed, patient examined, no change in status, stable for surgery.  I have reviewed the patient's chart and labs.  Questions were answered to the patient's satisfaction.     Aldean Hummingbird

## 2024-01-17 NOTE — Progress Notes (Signed)
 Received a call regarding patient status. When RN asked who the caller was, he stated "I'm her son!" In a very defensive tone. He continued to RN asked his name and due to his agitation, unable to understand. He began to yell at RN, "I just want to know about my mama", "Someone told me she had surgery at 8.", "Who do I need to talk to so I can know what's going on." RN was barely able to get a chance to speak. RN was able to tell him that surgery began at 10:02. This seemed to upset him more because, "Someone just told me her surgery stared at 8." After trying to reiterate that the OR start time was 10:02, he finally stated, "I'm coming up there!", and hung up.

## 2024-01-17 NOTE — Plan of Care (Signed)

## 2024-01-17 NOTE — Anesthesia Preprocedure Evaluation (Addendum)
 Anesthesia Evaluation  Patient identified by MRN, date of birth, ID band Patient awake    Reviewed: Allergy & Precautions, NPO status , Patient's Chart, lab work & pertinent test results  Airway Mallampati: III  TM Distance: >3 FB Neck ROM: Full  Mouth opening: Limited Mouth Opening  Dental no notable dental hx. (+) Edentulous Upper, Poor Dentition, Missing, Dental Advisory Given   Pulmonary former smoker   Pulmonary exam normal breath sounds clear to auscultation       Cardiovascular hypertension, Pt. on medications + DVT (on xarelto)  Normal cardiovascular exam Rhythm:Regular Rate:Normal  TTE 2019 - Normal LV size with EF 60-65%. Moderate diastolic dysfunction. Normal RV size and systolic function. No significant valvular abnormalities.   Neuro/Psych negative neurological ROS  negative psych ROS   GI/Hepatic negative GI ROS,,,(+)     substance abuse  marijuana use  Endo/Other    Class 3 obesity  Renal/GU negative Renal ROS  negative genitourinary   Musculoskeletal negative musculoskeletal ROS (+)    Abdominal   Peds  Hematology negative hematology ROS (+)   Anesthesia Other Findings   Reproductive/Obstetrics                             Anesthesia Physical Anesthesia Plan  ASA: 3  Anesthesia Plan: General   Post-op Pain Management: Ofirmev IV (intra-op)*   Induction: Intravenous and Cricoid pressure planned  PONV Risk Score and Plan: 3 and Ondansetron, Dexamethasone and Midazolam  Airway Management Planned: Oral ETT  Additional Equipment: None  Intra-op Plan:   Post-operative Plan: Extubation in OR  Informed Consent: I have reviewed the patients History and Physical, chart, labs and discussed the procedure including the risks, benefits and alternatives for the proposed anesthesia with the patient or authorized representative who has indicated his/her understanding and  acceptance.     Dental advisory given  Plan Discussed with: CRNA and Anesthesiologist  Anesthesia Plan Comments:         Anesthesia Quick Evaluation

## 2024-01-17 NOTE — Anesthesia Postprocedure Evaluation (Signed)
 Anesthesia Post Note  Patient: Emma Medina  Procedure(s) Performed: LAPAROSCOPIC CHOLECYSTECTOMY, OPEN PRIMARY UMBILICAL HERNIA REPAIR INDOCYANINE GREEN FLUORESCENCE IMAGING (ICG)     Patient location during evaluation: PACU Anesthesia Type: General Level of consciousness: awake and alert Pain management: pain level controlled Vital Signs Assessment: post-procedure vital signs reviewed and stable Respiratory status: spontaneous breathing, nonlabored ventilation, respiratory function stable and patient connected to nasal cannula oxygen Cardiovascular status: blood pressure returned to baseline and stable Postop Assessment: no apparent nausea or vomiting Anesthetic complications: no   No notable events documented.  Last Vitals:  Vitals:   01/17/24 1345 01/17/24 1400  BP: 131/86 125/87  Pulse: 88 84  Resp: (!) 27 (!) 24  Temp:    SpO2: 93% 93%    Last Pain:  Vitals:   01/17/24 1400  TempSrc:   PainSc: Asleep                 Emma Medina

## 2024-01-17 NOTE — Op Note (Signed)
 Emma Medina [id number] 1971/01/16 01/17/2024  Laparoscopic Cholecystectomy with near infrared fluorescent cholangiography and open primary repair of umbilical hernia 2.5cm  procedure Note  Indications: This patient presents with symptomatic gallbladder disease and will undergo laparoscopic cholecystectomy. The patient's preop imaging demonstrated a very distended gallbladder almost going down to her pelvic inlet.  She also had a fat-containing umbilical hernia measuring 2-1/2 cm  Pre-operative Diagnosis: Acute calculus cholecystitis, umbilical hernia 2.5cm   Post-operative Diagnosis: Severe acute calculus cholecystitis, umbilical hernia  Surgeon: Aldean Hummingbird MD FACS  Assistants: none  Anesthesia: General endotracheal anesthesia  Procedure Details  The patient was seen again in the Holding Room. The risks, benefits, complications, treatment options, and expected outcomes were discussed with the patient. The possibilities of reaction to medication, pulmonary aspiration, perforation of viscus, bleeding, recurrent infection, finding a normal gallbladder, the need for additional procedures, failure to diagnose a condition, the possible need to convert to an open procedure, and creating a complication requiring transfusion or operation were discussed with the patient. The likelihood of improving the patient's symptoms with return to their baseline status is good.  The patient and/or family concurred with the proposed plan, giving informed consent. The site of surgery properly noted. The patient was taken to Operating Room, identified as Emma Medina and the procedure verified as Laparoscopic Cholecystectomy with ICG dye.  A Time Out was held and the above information confirmed. Antibiotic prophylaxis was administered.    ICG dye was administered preoperatively.    General endotracheal anesthesia was then administered and tolerated well. After the induction, the abdomen was prepped with  Chloraprep and draped in the sterile fashion. The patient was positioned in the supine position.  Because of the patient's obesity I elected to gain access to the abdomen using the Optiview technique a small incision was made in the left upper quadrant a little bit off the midline.  Then using a 0 degree 5 mm laparoscope I advanced it through all layers abdominal wall through the 5 Miller trocar and carefully entered the abdominal cavity.  Pneumoperitoneum was smoothly established up to a patient pressure of 15 mmHg.  The abdominal cavity was surveilled.  There was no evidence of injury to surrounding structures.  The gallbladder was visualized extending down the right abdomen into the right lower quadrant.  It was very thickened and inflamed and covered with omentum.  The patient had about a 2-1/2 cm fascial defect at the umbilicus.  Patient was placed in reverse Trendelenburg and rotated to the left.  I made a transverse incision underneath the umbilicus.  I then advanced a 12 mm trocar through the soft tissue through the fascial defect and inflated the balloon.  I could not place my 5 mm trocars in the right abdomen because of how large and distended gallbladder was in its location.  I ended up having to aspirate the gallbladder first in order to place the right lateral abdominal wall trocar.  I then placed my 2 right-sided abdominal wall trocars under direct visualization after local been infiltrated.  The omentum was then peeled off of the gallbladder.  The entire body gallbladder was covered with omentum.  The omentum was raw.  The gallbladder wall was very thickened.  We were able to finally grasped the fundus and retracted cephalad.  Adhesions were lysed bluntly and with the electrocautery where indicated, taking care not to injure any adjacent organs or viscus. The infundibulum was grasped and retracted laterally, exposing the peritoneum overlying the triangle  of Calot. This was then divided and  exposed in a blunt fashion.  Was a fair amount of inflammation of the tissue in this area which was somewhat friable.  However I was able to obtain a critical view of the cystic duct and cystic artery was obtained.  The cystic duct was clearly identified and bluntly dissected circumferentially.  Utilizing the Stryker camera system near infrared fluorescent activity was visualized in the liver, common hepatic duct and common bile duct & and small bowel.  I saw no activity within the cystic duct structure nor the gallbladder.  The cystic duct was t dilated and appeared a little bit short.  Using the suction irrigator catheter I was able to lift the gallbladder off the cystic plate.  I saw no other tubular structures entering the gallbladder.  Again fluorescent cholangiography was performed which demonstrated activity in the common hepatic duct and common bile duct which was away from the structure which appeared to be consistent with the cystic duct which was going into the gallbladder.  This served as a secondary confirmation of our anatomy.  Due to the dilation of the cystic duct it barely accommodated a 5 mm clip.  I placed it right as it entered the gallbladder.  I then transected the cystic duct just below that.  I ended up placing a PDS Endoloop around the cystic duct stump.  The cystic artery which had been identified & dissected free was ligated with clips and divided as well.  Fluorescent cholangiography was performed again which demonstrated ongoing activity within the common hepatic and common bile duct and small bowel.  There is no evidence of bile leak.  The gallbladder was dissected from the liver bed in retrograde fashion with the electrocautery.  Posteriorly there was a thickened rim of rind on the posterior inferior side of the gallbladder which did not appear to have a lumen but it was a little bit thick so I placed 2 Hem-o-lok clips on it and transected it above it.  It took some time to  mobilize the gallbladder off of the liver given how large the gallbladder was.  I then irrigated the right upper quadrant.  There is a little bit of oozing from the gallbladder fossa which was not unexpected.  I increased the cautery to 80 and cauterize the gallbladder fossa.  There had been 1 or 2 stones that had fallen out of the gallbladder which were extracted manually.  The gallbladder was removed and placed in an Ecco sac.  The gallbladder and Ecco sac were then removed through the umbilical port site.  However I had to enlarge the fascial defect slightly in order to extract the gallbladder.  I ended up actually opening up the gallbladder and manually extracting stones out of the gallbladder given how large the gallbladder was.  Pneumoperitoneum was reestablished.  I inspected the gallbladder fossa again.  There is no evidence of bleeding.  The omentum that had encased the gallbladder was still little bit raw but no overt bleeding.  Surgicel powder was placed in the gallbladder fossa and on the omentum.  I then introduced a round 19 French drain through the umbilical trocar and brought it out through the right lateral trocar and secured it to the skin with a 2-0 nylon.  The drain was placed in the gallbladder fossa..  I then closed the umbilical fascia transversely with 6 interrupted #1 Novafil sutures.  We then went back in laparoscopically and confirmed the closure was airtight.  Nothing was trapped in the closure.  Pneumoperitoneum was released.  Remaining trocars were removed.  The umbilical wound was irrigated with clinda gent irrigation solution since there has been spillage of bile and gallstones at the umbilicus when we were trying to extract the gallbladder from the abdomen.  I then reapproximated deep subcutaneous tissue with interrupted 3-0 Vicryl suture skin was then closed with a running 4-0 Monocryl in a subcuticular fashion.  The two 5 mm trocars were closed in a similar fashion with 4-0  Monocryl.  Dermabond was applied and then a drain sponge was placed around the surgical drain site.   The patient was then extubated and brought to the recovery room in stable condition. Instrument, sponge, and needle counts were correct at closure and at the conclusion of the case.   Findings: Severe cholecystitis with Cholelithiasis Positive critical view Fluorescent cholangiography visualized within the common hepatic duct, common bile duct and small bowel.  No fluorescent activity seen within the cystic duct.  PDS Endoloop around the cystic duct stump.  Surgicel powder in the gallbladder fossa and on the omentum.  Thickened inflammatory rind inferior and posterior along the bottom of the gallbladder did not appear to be tubular structure but nonetheless I did place 2 hemologic clips on it -this was away from the common hepatic duct and common bile duct.  Estimated Blood Loss: Minimal         Drains: none         Specimens: Gallbladder           Complications: None; patient tolerated the procedure well.         Disposition: PACU - hemodynamically stable.         Condition: stable  Marianna Shirk. Elvan Hamel, MD, FACS General, Bariatric, & Minimally Invasive Surgery [facility] Surgery,  [facility]

## 2024-01-18 ENCOUNTER — Encounter (HOSPITAL_COMMUNITY): Payer: Self-pay | Admitting: General Surgery

## 2024-01-18 DIAGNOSIS — K8000 Calculus of gallbladder with acute cholecystitis without obstruction: Secondary | ICD-10-CM | POA: Diagnosis not present

## 2024-01-18 LAB — CBC
HCT: 41.1 % (ref 36.0–46.0)
Hemoglobin: 12.8 g/dL (ref 12.0–15.0)
MCH: 25.9 pg — ABNORMAL LOW (ref 26.0–34.0)
MCHC: 31.1 g/dL (ref 30.0–36.0)
MCV: 83.2 fL (ref 80.0–100.0)
Platelets: 292 10*3/uL (ref 150–400)
RBC: 4.94 MIL/uL (ref 3.87–5.11)
RDW: 19 % — ABNORMAL HIGH (ref 11.5–15.5)
WBC: 19.1 10*3/uL — ABNORMAL HIGH (ref 4.0–10.5)
nRBC: 0 % (ref 0.0–0.2)

## 2024-01-18 LAB — COMPREHENSIVE METABOLIC PANEL WITH GFR
ALT: 38 U/L (ref 0–44)
AST: 33 U/L (ref 15–41)
Albumin: 3 g/dL — ABNORMAL LOW (ref 3.5–5.0)
Alkaline Phosphatase: 122 U/L (ref 38–126)
Anion gap: 9 (ref 5–15)
BUN: 12 mg/dL (ref 6–20)
CO2: 25 mmol/L (ref 22–32)
Calcium: 8.7 mg/dL — ABNORMAL LOW (ref 8.9–10.3)
Chloride: 102 mmol/L (ref 98–111)
Creatinine, Ser: 0.92 mg/dL (ref 0.44–1.00)
GFR, Estimated: >60 mL/min (ref >60)
Glucose, Bld: 137 mg/dL — ABNORMAL HIGH (ref 70–99)
Potassium: 3.9 mmol/L (ref 3.5–5.1)
Sodium: 136 mmol/L (ref 135–145)
Total Bilirubin: 0.7 mg/dL (ref 0.0–1.2)
Total Protein: 6.9 g/dL (ref 6.5–8.1)

## 2024-01-18 LAB — SURGICAL PATHOLOGY

## 2024-01-18 NOTE — Progress Notes (Signed)
 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  [place] with dilated CBD and cholelithiasis but no frank cholecystitis.  CT with distended gallbladder, ?GB wall edema.  Surgery consulted and is planning cholecystectomy on 4/15.  MRCP is pending.  Treating with Ceftriaxone and metronidazole.  Xarelto is on hold.    Assessment & Plan:   Principal Problem:   Acute calculous cholecystitis Active Problems:   Obesity (BMI 35.0-39.9 without comorbidity)   At risk for hemorrhage associated with anticoagulation therapy   Peripheral vascular disease of lower extremity (HCC)   Common bile duct dilatation   Hypertension   Chronic anticoagulation   Elevated troponin level   Hyperglycemia   History of DVT (deep vein thrombosis)   Umbilical hernia, incarcerated   Cholecystitis    Acute cholecystitis-surgery advancing diet 1 year of intermittent pain consistent with biliary colic and 1 day of worsening now constant pain On imaging, dilated CBD, cholelithiasis, acute cholecystitis, no choledocholithiasis Continue ceftriaxone 2 g IV every 24 hours, add Flagyl 500 mg IV every 12 hours Hold home Xarelto preoperatively.  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  Recommend interval CT outpatient - discussed with patient on admission  Hepatic steatosis: Weight loss, monitoring outpatient   Prior limb ischemia from arterial thromboembolism requiring thrombectomy in '19/ PAD Hold home Xarelto preoperatively Continue aspirin daily Not on statin but LDL is 128, likely needs - will defer to PCP   Squamous cell carcinoma of cervix stage Ib Receiving chemoradiation Outpatient GYN-onc f/u   Hypertension-blood pressure 128/74 not on any antihypertensives prior to admission  Estimated body mass index is 36.81 kg/m as calculated from the following:   Height as of this encounter: 5\' 7"  (1.702 m).   Weight as of this encounter: 106.6 kg.  DVT prophylaxis: Lovenox  code Status: Full Family Communication: None bedside Disposition Plan:  Status is: Inpatient Remains inpatient appropriate because: Acute illness   Consultants:  General Surgery  Procedures: Laparoscopic cholecystectomy, open primary umbilical hernia repair  Antimicrobials: Rocephin and Flagyl  Subjective:  Tolerating full liquids denies any pain having flatus no BMs yet Objective: Vitals:   01/17/24 1430 01/17/24 1445 01/17/24 1941 01/18/24 0434  BP: 130/81 (!) 119/93 (!) 142/97 128/74  Pulse: 81 85 94 67  Resp: (!) 26 (!) 24 18 15   Temp:   98.5 F (36.9 C) (!) 97.4 F (36.3 C)  TempSrc:   Oral   SpO2: 93% 92% 100% 95%  Weight:      Height:  Intake/Output Summary (Last 24 hours) at 01/18/2024 1249 Last data filed at 01/18/2024 0900 Gross per 24 hour  Intake 3219.59 ml  Output 470 ml  Net 2749.59 ml   Filed Weights   01/15/24 1346 01/17/24 0837  Weight:  106.6 kg 106.6 kg    Examination:  General exam: Appears in no distress Respiratory system: Clear to auscultation. Respiratory effort normal. Cardiovascular system: S1 & S2 heard, RRR. No JVD, murmurs, rubs, gallops or clicks. No pedal edema. Gastrointestinal system: Abdomen is nondistended, soft and nontender. No organomegaly or masses felt. Normal bowel sounds heard.  Drain right upper quadrant in place with very minimal drainage serosanguineous Central nervous system: Alert and oriented. No focal neurological deficits. Extremities: Symmetric 5 x 5 power.  Data Reviewed: I have personally reviewed following labs and imaging studies  CBC: Recent Labs  Lab 01/15/24 1416 01/16/24 0539 01/17/24 0526 01/18/24 0530  WBC 7.8 10.1 17.7* 19.1*  HGB 14.2 13.2 14.0 12.8  HCT 44.4 41.7 44.1 41.1  MCV 81.2 81.8 81.5 83.2  PLT 322 308 295 292   Basic Metabolic Panel: Recent Labs  Lab 01/15/24 1416 01/15/24 2100 01/16/24 0539 01/17/24 0526 01/18/24 0530  NA 137  --  137 137 136  K 4.2  --  3.8 3.9 3.9  CL 104  --  104 101 102  CO2 23  --  24 24 25   GLUCOSE 146*  --  160* 143* 137*  BUN 9  --  9 9 12   CREATININE 0.78  --  0.62 0.86 0.92  CALCIUM 9.5  --  8.9 8.5* 8.7*  MG  --  2.3 1.9  --   --   PHOS  --  2.3* 3.8  --   --    GFR: Estimated Creatinine Clearance: 87.8 mL/min (by C-G formula based on SCr of 0.92 mg/dL). Liver Function Tests: Recent Labs  Lab 01/15/24 1549 01/16/24 0539 01/17/24 0526 01/18/24 0530  AST 41 24 14* 33  ALT 55* 43 31 38  ALKPHOS 168* 159* 148* 122  BILITOT 0.6 0.7 1.1 0.7  PROT 7.4 7.2 7.1 6.9  ALBUMIN 3.7 3.7 3.5 3.0*   Recent Labs  Lab 01/15/24 1549 01/16/24 0539  LIPASE 24 27   No results for input(s): "AMMONIA" in the last 168 hours. Coagulation Profile: Recent Labs  Lab 01/16/24 0539  INR 1.2   Cardiac Enzymes: No results for input(s): "CKTOTAL", "CKMB", "CKMBINDEX", "TROPONINI" in the last 168 hours. BNP (last 3  results) No results for input(s): "PROBNP" in the last 8760 hours. HbA1C: Recent Labs    01/15/24 2100  HGBA1C 6.5*   CBG: No results for input(s): "GLUCAP" in the last 168 hours. Lipid Profile: Recent Labs    01/16/24 0539  CHOL 223*  HDL 82  LDLCALC 128*  TRIG 65  CHOLHDL 2.7   Thyroid Function Tests: No results for input(s): "TSH", "T4TOTAL", "FREET4", "T3FREE", "THYROIDAB" in the last 72 hours. Anemia Panel: No results for input(s): "VITAMINB12", "FOLATE", "FERRITIN", "TIBC", "IRON", "RETICCTPCT" in the last 72 hours. Sepsis Labs: No results for input(s): "PROCALCITON", "LATICACIDVEN" in the last 168 hours.  No results found for this or any previous visit (from the past 240 hours).    Radiology Studies: No results found.   Scheduled Meds:  acetaminophen  1,000 mg Oral Q6H   aspirin EC  81 mg Oral Daily   enoxaparin (LOVENOX) injection  40 mg Subcutaneous Q24H   pantoprazole (PROTONIX) IV  40 mg Intravenous QHS  polycarbophil  625 mg Oral BID   sodium chloride flush  3 mL Intravenous Q12H   Continuous Infusions:  lactated ringers 75 mL/hr at 01/17/24 1901   promethazine (PHENERGAN) injection (IM or IVPB) 12.5 mg (01/17/24 1842)     LOS: 3 days    Time spent: 38 min Barbee Lew, MD  01/18/2024, 12:49 PM

## 2024-01-18 NOTE — Progress Notes (Signed)
 Patient ID: Emma Medina, female   DOB: Feb 07, 1971, 53 y.o.   MRN: [id number]   Acute Care Surgery Service Progress Note:    Chief Complaint/Subjective: Doing ok Sore around navel No n/v  Objective: Vital signs in last 24 hours: Temp:  [97.4 F (36.3 C)-98.5 F (36.9 C)] 97.4 F (36.3 C) (04/16 0434) Pulse Rate:  [67-97] 67 (04/16 0434) Resp:  [15-27] 15 (04/16 0434) BP: (119-142)/(74-97) 128/74 (04/16 0434) SpO2:  [92 %-100 %] 95 % (04/16 0434) Last BM Date : 01/15/24  Intake/Output from previous day: 04/15 0701 - 04/16 0700 In: 2982.6 [P.O.:240; I.V.:1835.9; IV Piggyback:906.7] Out: 520 [Urine:450; Drains:20; Blood:50] Intake/Output this shift: No intake/output data recorded.  Lungs: cta, nonlabored  Cardiovascular: reg  Abd: soft, approp TTP, drain -serosang  Extremities: no edema, +SCDs  Neuro: alert, nonfocal  Lab Results: CBC  Recent Labs    01/17/24 0526 01/18/24 0530  WBC 17.7* 19.1*  HGB 14.0 12.8  HCT 44.1 41.1  PLT 295 292   BMET Recent Labs    01/17/24 0526 01/18/24 0530  NA 137 136  K 3.9 3.9  CL 101 102  CO2 24 25  GLUCOSE 143* 137*  BUN 9 12  CREATININE 0.86 0.92  CALCIUM 8.5* 8.7*   LFT    Latest Ref Rng & Units 01/18/2024    5:30 AM 01/17/2024    5:26 AM 01/16/2024    5:39 AM  Hepatic Function  Total Protein 6.5 - 8.1 g/dL 6.9  7.1  7.2   Albumin 3.5 - 5.0 g/dL 3.0  3.5  3.7   AST 15 - 41 U/L 33  14  24   ALT 0 - 44 U/L 38  31  43   Alk Phosphatase 38 - 126 U/L 122  148  159   Total Bilirubin 0.0 - 1.2 mg/dL 0.7  1.1  0.7    PT/INR Recent Labs    01/16/24 0539  LABPROT 15.2  INR 1.2   ABG No results for input(s): "PHART", "HCO3" in the last 72 hours.  Invalid input(s): "PCO2", "PO2"  Studies/Results:  Anti-infectives: Anti-infectives (From admission, onward)    Start     Dose/Rate Route Frequency Ordered Stop   01/17/24 2200  metroNIDAZOLE (FLAGYL) IVPB 500 mg        500 mg 100 mL/hr over 60 Minutes  Intravenous Every 12 hours 01/17/24 1506 01/18/24 0945   01/17/24 2130  cefTRIAXone (ROCEPHIN) 2 g in sodium chloride 0.9 % 100 mL IVPB        2 g 200 mL/hr over 30 Minutes Intravenous Daily at bedtime 01/17/24 1506 01/17/24 2135   01/17/24 1229  clindamycin (CLEOCIN) 900 mg, gentamicin (GARAMYCIN) 240 mg in sodium chloride 0.9 % 1,000 mL for intraperitoneal lavage  Status:  Discontinued          As needed 01/17/24 1229 01/17/24 1447   01/17/24 1200  clindamycin (CLEOCIN) 900 mg, gentamicin (GARAMYCIN) 240 mg in sodium chloride 0.9 % 1,000 mL for intraperitoneal lavage  Status:  Discontinued         Irrigation To Surgery 01/17/24 1147 01/17/24 1447   01/16/24 2100  cefTRIAXone (ROCEPHIN) 2 g in sodium chloride 0.9 % 100 mL IVPB  Status:  Discontinued        2 g 200 mL/hr over 30 Minutes Intravenous Every 24 hours 01/15/24 2246 01/17/24 1506   01/15/24 2300  metroNIDAZOLE (FLAGYL) IVPB 500 mg  Status:  Discontinued        500  mg 100 mL/hr over 60 Minutes Intravenous Every 12 hours 01/15/24 2246 01/17/24 1506   01/15/24 2100  cefTRIAXone (ROCEPHIN) 2 g in sodium chloride 0.9 % 100 mL IVPB  Status:  Discontinued       Note to Pharmacy: Pharmacy may adjust dosing strength / duration / interval for maximal efficacy   2 g 200 mL/hr over 30 Minutes Intravenous Daily-1800 01/15/24 2028 01/15/24 2246       Medications: Scheduled Meds:  acetaminophen  1,000 mg Oral Q6H   aspirin EC  81 mg Oral Daily   enoxaparin (LOVENOX) injection  40 mg Subcutaneous Q24H   pantoprazole (PROTONIX) IV  40 mg Intravenous QHS   polycarbophil  625 mg Oral BID   sodium chloride flush  3 mL Intravenous Q12H   Continuous Infusions:  lactated ringers 75 mL/hr at 01/17/24 1901   promethazine (PHENERGAN) injection (IM or IVPB) 12.5 mg (01/17/24 1842)   PRN Meds:.alum & mag hydroxide-simeth, bisacodyl, HYDROmorphone (DILAUDID) injection, magic mouthwash, melatonin, menthol-cetylpyridinium, methocarbamol (ROBAXIN)  injection, methocarbamol, naphazoline-glycerin, ondansetron (ZOFRAN) IV, mouth rinse, oxyCODONE, phenol, polyethylene glycol, promethazine (PHENERGAN) injection (IM or IVPB), simethicone, sodium chloride  Assessment/Plan: Patient Active Problem List   Diagnosis Date Noted   Acute calculous cholecystitis 01/15/2024   Common bile duct dilatation 01/15/2024   Chronic anticoagulation 01/15/2024   Elevated troponin level 01/15/2024   Hyperglycemia 01/15/2024   History of DVT (deep vein thrombosis) 01/15/2024   Umbilical hernia, incarcerated 01/15/2024   Cholecystitis 01/15/2024   Hypertension    DVT (deep vein thrombosis) in pregnancy    Abnormal posture 08/28/2023   Decreased ROM of intervertebral discs of lumbar spine 08/28/2023   Decreased ROM of intervertebral discs of thoracic spine 08/28/2023   Decreased strength 08/28/2023   Chronic midline low back pain without sciatica 07/11/2023   Degeneration of intervertebral disc of lumbosacral region with discogenic back pain 07/11/2023   Inflammation of lung 12/12/2019   Screening breast examination 10/16/2019   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   Dysuria 08/13/2019   History of radiation therapy 08/10/2019   Hypomagnesemia 08/06/2019   Abdominal pain 07/30/2019   Tinnitus, subjective, bilateral 07/23/2019   Hypokalemia due to excessive gastrointestinal loss of potassium 07/16/2019   Diarrhea 07/16/2019   Peripheral vascular disease of lower extremity (HCC) 06/28/2019   Malignant neoplasm of overlapping sites of cervix (HCC) 06/01/2019   Obesity (BMI 35.0-39.9 without comorbidity) 06/01/2019   At risk for hemorrhage associated with anticoagulation therapy 06/01/2019   Fibroids 04/26/2019   Obstructive thrombus 05/18/2018   Ischemia of extremity 05/18/2018   s/p Procedure(s): LAPAROSCOPIC CHOLECYSTECTOMY, OPEN PRIMARY UMBILICAL HERNIA REPAIR INDOCYANINE GREEN FLUORESCENCE IMAGING (ICG) 01/17/2024 Labs  ok Cont drain - drain teaching Discussed intra op findings  FEN: adv diet as tolerated ID: ceftriaxone/flagyl end today VTE: subcu lovenox   I reviewed hospitalist notes, last 24 h vitals and pain scores, last 48 h intake and output, last 24 h labs and trends, and last 24 h imaging results. Disposition:  LOS: 3 days    Marianna Shirk. Elvan Hamel, MD, FACS General, Bariatric, & Minimally Invasive Surgery [phone] [facility], [facility]

## 2024-01-18 NOTE — Discharge Instructions (Signed)

## 2024-01-19 DIAGNOSIS — K8000 Calculus of gallbladder with acute cholecystitis without obstruction: Secondary | ICD-10-CM | POA: Diagnosis not present

## 2024-01-19 LAB — COMPREHENSIVE METABOLIC PANEL WITH GFR
ALT: 28 U/L (ref 0–44)
AST: 20 U/L (ref 15–41)
Albumin: 2.9 g/dL — ABNORMAL LOW (ref 3.5–5.0)
Alkaline Phosphatase: 108 U/L (ref 38–126)
Anion gap: 8 (ref 5–15)
BUN: 16 mg/dL (ref 6–20)
CO2: 26 mmol/L (ref 22–32)
Calcium: 8.3 mg/dL — ABNORMAL LOW (ref 8.9–10.3)
Chloride: 101 mmol/L (ref 98–111)
Creatinine, Ser: 0.84 mg/dL (ref 0.44–1.00)
GFR, Estimated: >60 mL/min (ref >60)
Glucose, Bld: 116 mg/dL — ABNORMAL HIGH (ref 70–99)
Potassium: 3.9 mmol/L (ref 3.5–5.1)
Sodium: 135 mmol/L (ref 135–145)
Total Bilirubin: 0.4 mg/dL (ref 0.0–1.2)
Total Protein: 6.4 g/dL — ABNORMAL LOW (ref 6.5–8.1)

## 2024-01-19 LAB — CBC
HCT: 35.9 % — ABNORMAL LOW (ref 36.0–46.0)
Hemoglobin: 11 g/dL — ABNORMAL LOW (ref 12.0–15.0)
MCH: 26 pg (ref 26.0–34.0)
MCHC: 30.6 g/dL (ref 30.0–36.0)
MCV: 84.9 fL (ref 80.0–100.0)
Platelets: 287 10*3/uL (ref 150–400)
RBC: 4.23 MIL/uL (ref 3.87–5.11)
RDW: 18.8 % — ABNORMAL HIGH (ref 11.5–15.5)
WBC: 14.3 10*3/uL — ABNORMAL HIGH (ref 4.0–10.5)
nRBC: 0 % (ref 0.0–0.2)

## 2024-01-19 MED ORDER — POLYETHYLENE GLYCOL 3350 17 G PO PACK: 17.0000 g | PACK | Freq: Every day | ORAL | Status: DC | PRN

## 2024-01-19 MED ORDER — AMOXICILLIN-POT CLAVULANATE 875-125 MG PO TABS: 1.0000 | ORAL_TABLET | Freq: Two times a day (BID) | ORAL | 0 refills | Status: AC

## 2024-01-19 MED ORDER — RIVAROXABAN 20 MG PO TABS: 20.0000 mg | ORAL_TABLET | Freq: Every day | ORAL | Status: DC

## 2024-01-19 MED ORDER — ONDANSETRON HCL 4 MG PO TABS: 4.0000 mg | ORAL_TABLET | Freq: Every day | ORAL | 1 refills | Status: DC | PRN

## 2024-01-19 MED ORDER — ACETAMINOPHEN 500 MG PO TABS: 1000.0000 mg | ORAL_TABLET | Freq: Four times a day (QID) | ORAL | Status: AC | PRN

## 2024-01-19 MED ORDER — ONDANSETRON HCL 4 MG/2ML IJ SOLN: 4.0000 mg | Freq: Four times a day (QID) | INTRAMUSCULAR | 0 refills | Status: DC | PRN

## 2024-01-19 MED ORDER — OXYCODONE HCL 5 MG PO TABS: 5.0000 mg | ORAL_TABLET | Freq: Four times a day (QID) | ORAL | 0 refills | Status: AC | PRN

## 2024-01-19 NOTE — Progress Notes (Addendum)
 Patient ID: Emma Medina, female   DOB: November 09, 1970, 53 y.o.   MRN: [id number]   Acute Care Surgery Service Progress Note:    Chief Complaint/Subjective: Having expected abdominal soreness post op - not worsening. Tolerating solid diet. Passing flatus. Bm this am  Objective: Vital signs in last 24 hours: Temp:  [98.1 F (36.7 C)-98.6 F (37 C)] 98.1 F (36.7 C) (04/17 0455) Pulse Rate:  [59-77] 69 (04/17 0455) Resp:  [16] 16 (04/17 0455) BP: (107-130)/(72-85) 120/72 (04/17 0455) SpO2:  [96 %-100 %] 100 % (04/17 0455) Last BM Date : 01/15/24  Intake/Output from previous day: 04/16 0701 - 04/17 0700 In: 237 [P.O.:237] Out: 50 [Drains:50] Intake/Output this shift: No intake/output data recorded.  Lungs: cta, nonlabored  Abd: soft, approp TTP, incisions cdi, drain -serosang  Extremities: no edema, +SCDs  Neuro: alert, nonfocal  Lab Results: CBC  Recent Labs    01/18/24 0530 01/19/24 0608  WBC 19.1* 14.3*  HGB 12.8 11.0*  HCT 41.1 35.9*  PLT 292 287   BMET Recent Labs    01/18/24 0530 01/19/24 0608  NA 136 135  K 3.9 3.9  CL 102 101  CO2 25 26  GLUCOSE 137* 116*  BUN 12 16  CREATININE 0.92 0.84  CALCIUM 8.7* 8.3*   LFT    Latest Ref Rng & Units 01/19/2024    6:08 AM 01/18/2024    5:30 AM 01/17/2024    5:26 AM  Hepatic Function  Total Protein 6.5 - 8.1 g/dL 6.4  6.9  7.1   Albumin 3.5 - 5.0 g/dL 2.9  3.0  3.5   AST 15 - 41 U/L 20  33  14   ALT 0 - 44 U/L 28  38  31   Alk Phosphatase 38 - 126 U/L 108  122  148   Total Bilirubin 0.0 - 1.2 mg/dL 0.4  0.7  1.1    PT/INR No results for input(s): "LABPROT", "INR" in the last 72 hours.  ABG No results for input(s): "PHART", "HCO3" in the last 72 hours.  Invalid input(s): "PCO2", "PO2"  Studies/Results:  Anti-infectives: Anti-infectives (From admission, onward)    Start     Dose/Rate Route Frequency Ordered Stop   01/17/24 2200  metroNIDAZOLE (FLAGYL) IVPB 500 mg        500 mg 100 mL/hr over 60  Minutes Intravenous Every 12 hours 01/17/24 1506 01/18/24 0945   01/17/24 2130  cefTRIAXone (ROCEPHIN) 2 g in sodium chloride 0.9 % 100 mL IVPB        2 g 200 mL/hr over 30 Minutes Intravenous Daily at bedtime 01/17/24 1506 01/17/24 2135   01/17/24 1229  clindamycin (CLEOCIN) 900 mg, gentamicin (GARAMYCIN) 240 mg in sodium chloride 0.9 % 1,000 mL for intraperitoneal lavage  Status:  Discontinued          As needed 01/17/24 1229 01/17/24 1447   01/17/24 1200  clindamycin (CLEOCIN) 900 mg, gentamicin (GARAMYCIN) 240 mg in sodium chloride 0.9 % 1,000 mL for intraperitoneal lavage  Status:  Discontinued         Irrigation To Surgery 01/17/24 1147 01/17/24 1447   01/16/24 2100  cefTRIAXone (ROCEPHIN) 2 g in sodium chloride 0.9 % 100 mL IVPB  Status:  Discontinued        2 g 200 mL/hr over 30 Minutes Intravenous Every 24 hours 01/15/24 2246 01/17/24 1506   01/15/24 2300  metroNIDAZOLE (FLAGYL) IVPB 500 mg  Status:  Discontinued  500 mg 100 mL/hr over 60 Minutes Intravenous Every 12 hours 01/15/24 2246 01/17/24 1506   01/15/24 2100  cefTRIAXone (ROCEPHIN) 2 g in sodium chloride 0.9 % 100 mL IVPB  Status:  Discontinued       Note to Pharmacy: Pharmacy may adjust dosing strength / duration / interval for maximal efficacy   2 g 200 mL/hr over 30 Minutes Intravenous Daily-1800 01/15/24 2028 01/15/24 2246       Medications: Scheduled Meds:  acetaminophen  1,000 mg Oral Q6H   aspirin EC  81 mg Oral Daily   pantoprazole (PROTONIX) IV  40 mg Intravenous QHS   polycarbophil  625 mg Oral BID   rivaroxaban  20 mg Oral Q supper   sodium chloride flush  3 mL Intravenous Q12H   Continuous Infusions:  promethazine (PHENERGAN) injection (IM or IVPB) 12.5 mg (01/17/24 1842)   PRN Meds:.alum & mag hydroxide-simeth, bisacodyl, HYDROmorphone (DILAUDID) injection, magic mouthwash, melatonin, menthol-cetylpyridinium, methocarbamol (ROBAXIN) injection, methocarbamol, naphazoline-glycerin, ondansetron  (ZOFRAN) IV, mouth rinse, oxyCODONE, phenol, polyethylene glycol, promethazine (PHENERGAN) injection (IM or IVPB), simethicone, sodium chloride  Assessment/Plan: Patient Active Problem List   Diagnosis Date Noted   Acute calculous cholecystitis 01/15/2024   Common bile duct dilatation 01/15/2024   Chronic anticoagulation 01/15/2024   Elevated troponin level 01/15/2024   Hyperglycemia 01/15/2024   History of DVT (deep vein thrombosis) 01/15/2024   Umbilical hernia, incarcerated 01/15/2024   Cholecystitis 01/15/2024   Hypertension    DVT (deep vein thrombosis) in pregnancy    Abnormal posture 08/28/2023   Decreased ROM of intervertebral discs of lumbar spine 08/28/2023   Decreased ROM of intervertebral discs of thoracic spine 08/28/2023   Decreased strength 08/28/2023   Chronic midline low back pain without sciatica 07/11/2023   Degeneration of intervertebral disc of lumbosacral region with discogenic back pain 07/11/2023   Inflammation of lung 12/12/2019   Screening breast examination 10/16/2019   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   Dysuria 08/13/2019   History of radiation therapy 08/10/2019   Hypomagnesemia 08/06/2019   Abdominal pain 07/30/2019   Tinnitus, subjective, bilateral 07/23/2019   Hypokalemia due to excessive gastrointestinal loss of potassium 07/16/2019   Diarrhea 07/16/2019   Peripheral vascular disease of lower extremity (HCC) 06/28/2019   Malignant neoplasm of overlapping sites of cervix (HCC) 06/01/2019   Obesity (BMI 35.0-39.9 without comorbidity) 06/01/2019   At risk for hemorrhage associated with anticoagulation therapy 06/01/2019   Fibroids 04/26/2019   Obstructive thrombus 05/18/2018   Ischemia of extremity 05/18/2018   s/p Procedure(s): LAPAROSCOPIC CHOLECYSTECTOMY, OPEN PRIMARY UMBILICAL HERNIA REPAIR INDOCYANINE GREEN FLUORESCENCE IMAGING (ICG) 01/17/2024  WBC improved but still elevated. Will send 3 additional days abx on  discharge Cont drain - drain teaching. Discussed return precautions with patient Can resume anticoagulation on dc  FEN: soft ID: ceftriaxone/flagyl stopped yesterday. Will sed on additional abx VTE: subcu lovenox  Disposition: stable for dc from surgical standpoint    LOS: 4 days    Elwin Hammond, [facility] 01/19/2024, 10:58 AM Please see Amion for pager number during day hours 7:00am-4:30pm

## 2024-01-19 NOTE — Discharge Summary (Signed)
 Physician Discharge Summary  Emma Medina:[id number] DOB: 28-Jan-1971 DOA: 01/15/2024  PCP: Lavinia Sharps, NP  Admit date: 01/15/2024 Discharge date: 01/19/2024  Admitted From: home Disposition:home Recommendations for Outpatient Follow-up:  Follow up with PCP in 1-2 weeks Please obtain BMP/CBC in one week Emma Medina please refer her to CT surgery for evaluation of soft tissue anterior mediastinal mass new since 2022 and please get a CT chest for evaluation of right upper lobe pulmonary nodule 3 to 4 mm in 3 to 6 months. 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  [place] with dilated CBD and cholelithiasis but no frank cholecystitis.  CT with distended gallbladder, ?GB wall edema.  Surgery consulted and is planning cholecystectomy on 4/15.  MRCP is pending.  Treating with Ceftriaxone and metronidazole.  Xarelto is on hold.    Discharge Diagnoses:  Principal Problem:   Acute calculous cholecystitis Active Problems:   Obesity (BMI 35.0-39.9 without comorbidity)   At risk for hemorrhage associated with anticoagulation therapy   Peripheral vascular disease of lower extremity (HCC)   Common bile duct dilatation   Hypertension   Chronic anticoagulation   Elevated troponin level   Hyperglycemia   History of DVT (deep vein thrombosis)   Umbilical hernia, incarcerated   Cholecystitis  Acute cholecystitis-she underwent laparoscopic cholecystectomy with open primary umbilical hernia repair on 15 April with drain in place for severe cholecystitis and cholelithiasis appreciated at the time  of surgery.  She was treated with Rocephin and Flagyl and discharged on Augmentin.  She was able to tolerate a regular diet had flatus and BM prior to discharge.     Demand ischemia without chest pain or acute EKG changes she was continued on aspirin.   Groundglass opacities right middle lobe, lower lobe Asymptomatic   Incidental findings: Soft tissue anterior mediastinal mass, new from '22 Recommend interval CT and lab evaluation outpatient, referral to thoracic surgery for evaluation for need for biopsy - discussed with patient.  Pulmonary nodule right upper lobe 3-4 mm.  Recommend interval CT outpatient - discussed with patient.   Hepatic steatosis: Weight loss, monitoring outpatient   Prior limb ischemia from arterial thromboembolism requiring thrombectomy in '19/ PAD continue Xarelto at discharge.  Continue aspirin.  Patient probably will need statin for elevated LDL referred to PCP.LDL 128.  Squamous cell carcinoma of cervix stage Ib Receiving chemoradiation Outpatient GYN-onc f/u   Hypertension-blood pressure 128/74 not on any antihypertensives prior to admission    Estimated body mass index is 36.81 kg/m as calculated from the following:   Height as of this encounter: 5\' 7"  (1.702 m).   Weight as of this encounter: 106.6 kg.  Discharge Instructions  Discharge Instructions     Diet - low sodium heart healthy   Complete by: As directed    Increase activity slowly   Complete by: As directed       Allergies as of 01/19/2024   No Known Allergies      Medication List     TAKE these medications    acetaminophen 500 MG tablet Commonly known as: TYLENOL Take 2 tablets (1,000 mg total) by mouth every 6 (six) hours  as needed for mild pain (pain score 1-3) or moderate pain (pain score 4-6).   amoxicillin-clavulanate 875-125 MG tablet Commonly known as: AUGMENTIN Take 1 tablet by mouth 2 (two) times daily for 3 days.   aspirin EC 81 MG tablet Take 1 tablet (81 mg  total) by mouth daily.   ondansetron 4 MG tablet Commonly known as: Zofran Take 1 tablet (4 mg total) by mouth daily as needed for nausea or vomiting.   oxyCODONE 5 MG immediate release tablet Commonly known as: Oxy IR/ROXICODONE Take 1 tablet (5 mg total) by mouth every 6 (six) hours as needed for up to 5 days for moderate pain (pain score 4-6) or severe pain (pain score 7-10).   polyethylene glycol 17 g packet Commonly known as: MIRALAX / GLYCOLAX Take 17 g by mouth daily as needed for mild constipation.   rivaroxaban 20 MG Tabs tablet Commonly known as: Xarelto Take 1 tablet (20 mg total) by mouth daily with supper.        Follow-up Information     Maczis, Hedda Slade, [place]. Go on 02/14/2024.   Specialty: General Surgery Why: 5/13 at 10:30 am Please arrive 15 minutes early to complete check in, and bring photo ID and insurance card. Contact information: [address] [facility] [place] [address] [phone]         [facility], [place]. Go on 01/25/2024.   Specialty: General Surgery Why: Nurse visit drain check 4/23 at 9:30 am Please arrive 30 minutes early to complete check in, and bring photo ID and insurance card. Contact information: [address] [place] [address] [phone]        Placey, Emma Abrahams, NP. Call in 3 day(s).   Why: please check cbc and cmp 4/21. Contact information: [address] [place] [address] [phone]                No Known Allergies  Consultations: surgery  Procedures/Studies: MR 3D Recon At Scanner Result Date: 01/16/2024 CLINICAL DATA:  Nonspecific (abnormal) findings on radiological and other examination of musculoskeletal system. [id number] Pain [phone] Eval for Choledocholithiasis EXAM: 3-DIMENSIONAL MR IMAGE RENDERING ON ACQUISITION WORKSTATION TECHNIQUE: 3-dimensional MR images were rendered by post-processing of the original MR  data on an acquisition workstation. The 3-dimensional MR images were interpreted and findings were reported in the accompanying complete MR report for this study COMPARISON:  None Available. FINDINGS: Please refer to simultaneously acquired but separately reported MRI abdomen report for details per IMPRESSION: 3-dimensional MRCP images were rendered from the original MR data. Electronically Signed   By: Jules Schick M.D.   On: 01/16/2024 08:57   MR ABDOMEN MRCP W WO CONTAST Result Date: 01/16/2024 CLINICAL DATA:  Eval for Choledocholithiasis. Chest pain and vomiting with shortness of breath. History of squamous cell carcinoma of the cervix. EXAM: MRI ABDOMEN WITHOUT AND WITH CONTRAST (INCLUDING MRCP) TECHNIQUE: Multiplanar multisequence MR imaging of the abdomen was performed both before and after the administration of intravenous contrast. Heavily T2-weighted images of the biliary and pancreatic ducts were obtained, and three-dimensional MRCP images were rendered by post processing. CONTRAST:  10mL GADAVIST GADOBUTROL 1 MMOL/ML IV SOLN COMPARISON:  Ultrasound and CT scan chest, abdomen pelvis from yesterday-01/15/2024. FINDINGS: Lower chest: Unremarkable MR appearance to the lung bases. No pleural effusion. No pericardial effusion. Normal heart size. Hepatobiliary: The liver is mildly enlarged in size (length 17.4 cm). Noncirrhotic configuration. No focal lesion. Moderate diffuse hepatic  steatosis. No intra or extrahepatic bile duct dilation. Extrahepatic bile duct measures 4-6 mm in diameter. No choledocholithiasis. Markedly distended and dilated gallbladder measuring 18.8 cm in length and up to 5.6 cm in diameter. There is mild gallbladder wall thickening with moderate pericholecystic free fluid and mild to moderate pericholecystic fat stranding. Pancreas: No mass, inflammatory changes or other parenchymal abnormality identified. No main pancreatic duct dilation. Spleen:  Within normal limits in size and  appearance. No focal mass. Adrenals/Urinary Tract: Unremarkable adrenal glands. No hydroureteronephrosis. No suspicious renal mass. Stomach/Bowel: Visualized portions within the abdomen are unremarkable. No disproportionate dilation of bowel loops. Vascular/Lymphatic: No pathologically enlarged lymph nodes identified. No abdominal aortic aneurysm demonstrated. There is trace perihepatic and perisplenic ascites. Other:  There is a small fat containing periumbilical hernia. Musculoskeletal: No suspicious bone lesions identified. IMPRESSION: 1. Findings compatible with acute cholecystitis. No choledocholithiasis or bile duct dilation. 2. Mild hepatomegaly with moderate diffuse hepatic steatosis. 3. Multiple other nonacute observations, as described above. Electronically Signed   By: Jules Schick M.D.   On: 01/16/2024 08:51   CT Angio Chest/Abd/Pel for Dissection W and/or W/WO Result Date: 01/15/2024 CLINICAL DATA:  Chest pain and vomiting with shortness of breath. History of squamous cell carcinoma of the cervix. EXAM: CT ANGIOGRAPHY CHEST, ABDOMEN AND PELVIS TECHNIQUE: Non-contrast CT of the chest was initially obtained. Multidetector CT imaging through the chest, abdomen and pelvis was performed using the standard protocol during bolus administration of intravenous contrast. Multiplanar reconstructed images and MIPs were obtained and reviewed to evaluate the vascular anatomy. RADIATION DOSE REDUCTION: This exam was performed according to the departmental dose-optimization program which includes automated exposure control, adjustment of the mA and/or kV according to patient size and/or use of iterative reconstruction technique. CONTRAST:  OMNIPAQUE IOHEXOL 350 MG/ML SOLN COMPARISON:  Ultrasound abdomen 01/15/2024. CT abdomen and pelvis 02/16/2023. PET CT 02/17/2021. FINDINGS: CTA CHEST FINDINGS Cardiovascular: Preferential opacification of the thoracic aorta. No evidence of thoracic aortic aneurysm or  dissection. Normal heart size. No pericardial effusion. Mediastinum/Nodes: No enlarged mediastinal, hilar, or axillary lymph nodes. Thyroid gland, trachea, and esophagus demonstrate no significant findings. There is soft tissue in the upper anterior mediastinum which is new from 2022. Lungs/Pleura: There are minimal patchy ground-glass opacities in the inferior right middle lobe and right lower lobe. There is no pleural effusion or pneumothorax. There are few new pulmonary nodules in the right upper lobe measuring 3-4 mm. Musculoskeletal: No chest wall abnormality. No acute or significant osseous findings. Review of the MIP images confirms the above findings. CTA ABDOMEN AND PELVIS FINDINGS VASCULAR Aorta: Normal caliber aorta without aneurysm, dissection, vasculitis or significant stenosis. Celiac: Patent without evidence of aneurysm, dissection, vasculitis or significant stenosis. SMA: Patent without evidence of aneurysm, dissection, vasculitis or significant stenosis. Renals: Both renal arteries are patent without evidence of aneurysm, dissection, vasculitis, fibromuscular dysplasia or significant stenosis. IMA: Patent without evidence of aneurysm, dissection, vasculitis or significant stenosis. Inflow: Patent without evidence of aneurysm, dissection, vasculitis or significant stenosis. Veins: No obvious venous abnormality within the limitations of this arterial phase study. Review of the MIP images confirms the above findings. NON-VASCULAR Hepatobiliary: Multiple gallstones are present. The gallbladder is dilated. There is some questionable mild gallbladder wall edema. There is no biliary ductal dilatation. No focal liver lesions are identified. There is no surrounding inflammation. Pancreas: Unremarkable. No pancreatic ductal dilatation or surrounding inflammatory changes. Spleen: Normal in size without focal abnormality. Adrenals/Urinary Tract: Adrenal glands are unremarkable. Kidneys are normal, without  renal  calculi, focal lesion, or hydronephrosis. Bladder is unremarkable. Stomach/Bowel: Stomach is within normal limits. Appendix appears normal. No evidence of bowel wall thickening, distention, or inflammatory changes. Lymphatic: No enlarged lymph nodes are identified. There is some prominent inguinal lymph nodes bilaterally. Reproductive: Uterus and bilateral adnexa are unremarkable. Other: There is a small to moderate size fat containing umbilical hernia. There is no ascites. There is mild body wall edema. Musculoskeletal: Degenerative changes affect the spine. Review of the MIP images confirms the above findings. IMPRESSION: 1. No evidence for aortic dissection or aneurysm. 2. Cholelithiasis with dilated gallbladder and questionable mild gallbladder wall edema. Correlate clinically for acute cholecystitis. 3. Soft tissue in the upper anterior mediastinum is new from 2022. This could represent thymic hyperplasia, but other etiologies are not excluded. Recommend clinical correlation and follow-up. 4. New 3-4 mm pulmonary nodules in the right upper lobe, indeterminate. Follow-up recommended. 5. Mild ground-glass opacities in the inferior right middle lobe and right lower lobe which may represent infectious/inflammatory process. Electronically Signed   By: Darliss Cheney M.D.   On: 01/15/2024 18:40   US Abdomen Limited RUQ (LIVER/GB) Result Date: 01/15/2024 CLINICAL DATA:  [id number] RUQ discomfort [id number] EXAM: ULTRASOUND ABDOMEN LIMITED RIGHT UPPER QUADRANT COMPARISON:  Feb 16, 2023 FINDINGS: Technically difficult exam due to patient's body habitus and overlying bowel gas. Gallbladder: Biliary sludge and multiple gallstones filling the gallbladder. No sonographic Murphy sign or wall thickening. No pericholecystic fluid. Common bile duct: Diameter: 14 mm Liver: No focal lesion identified. Increased echogenicity. Portal vein is patent on color Doppler imaging with normal direction of blood flow towards the liver. Other:  None. IMPRESSION: 1. Moderate dilation of the common bile duct measuring 14 mm. Correlation with serum bilirubin recommended. A nonemergent multiphase abdominal MRI with IV contrast is recommended to evaluate for downstream obstruction. 2. Cholecystolithiasis in biliary sludge in the gallbladder. No findings of acute cholecystitis. 3. Hepatic steatosis. Electronically Signed   By: Wallie Char M.D.   On: 01/15/2024 16:24   DG Chest 2 View Result Date: 01/15/2024 CLINICAL DATA:  Chest pain EXAM: CHEST - 2 VIEW COMPARISON:  December 07, 2019 FINDINGS: Low lung volumes. No focal airspace consolidation, pleural effusion, or pneumothorax. No cardiomegaly. No acute fracture or destructive lesion. Multilevel thoracic osteophytosis. IMPRESSION: No acute cardiopulmonary abnormality. Electronically Signed   By: Wallie Char M.D.   On: 01/15/2024 14:23   (Echo, Carotid, EGD, Colonoscopy, ERCP)    Subjective:  No new c/o or events  Discharge Exam: Vitals:   01/18/24 2103 01/19/24 0455  BP: 130/85 120/72  Pulse: 77 69  Resp: 16 16  Temp: 98.5 F (36.9 C) 98.1 F (36.7 C)  SpO2: 100% 100%   Vitals:   01/18/24 0434 01/18/24 1338 01/18/24 2103 01/19/24 0455  BP: 128/74 107/73 130/85 120/72  Pulse: 67 (!) 59 77 69  Resp: 15 16 16 16   Temp: (!) 97.4 F (36.3 C) 98.6 F (37 C) 98.5 F (36.9 C) 98.1 F (36.7 C)  TempSrc:      SpO2: 95% 96% 100% 100%  Weight:      Height:        General: Pt is alert, awake, not in acute distress Cardiovascular: RRR, S1/S2 +, no rubs, no gallops Respiratory: CTA bilaterally, no wheezing, no rhonchi Abdominal: Soft, NT, ND, bowel sounds + drain in place  Extremities: no edema, no cyanosis    The results of significant diagnostics from this hospitalization (including imaging, microbiology, ancillary and laboratory) are listed  below for reference.     Microbiology: No results found for this or any previous visit (from the past 240 hours).   Labs: BNP  (last 3 results) No results for input(s): "BNP" in the last 8760 hours. Basic Metabolic Panel: Recent Labs  Lab 01/15/24 1416 01/15/24 2100 01/16/24 0539 01/17/24 0526 01/18/24 0530 01/19/24 0608  NA 137  --  137 137 136 135  K 4.2  --  3.8 3.9 3.9 3.9  CL 104  --  104 101 102 101  CO2 23  --  24 24 25 26   GLUCOSE 146*  --  160* 143* 137* 116*  BUN 9  --  9 9 12 16   CREATININE 0.78  --  0.62 0.86 0.92 0.84  CALCIUM 9.5  --  8.9 8.5* 8.7* 8.3*  MG  --  2.3 1.9  --   --   --   PHOS  --  2.3* 3.8  --   --   --    Liver Function Tests: Recent Labs  Lab 01/15/24 1549 01/16/24 0539 01/17/24 0526 01/18/24 0530 01/19/24 0608  AST 41 24 14* 33 20  ALT 55* 43 31 38 28  ALKPHOS 168* 159* 148* 122 108  BILITOT 0.6 0.7 1.1 0.7 0.4  PROT 7.4 7.2 7.1 6.9 6.4*  ALBUMIN 3.7 3.7 3.5 3.0* 2.9*   Recent Labs  Lab 01/15/24 1549 01/16/24 0539  LIPASE 24 27   No results for input(s): "AMMONIA" in the last 168 hours. CBC: Recent Labs  Lab 01/15/24 1416 01/16/24 0539 01/17/24 0526 01/18/24 0530 01/19/24 0608  WBC 7.8 10.1 17.7* 19.1* 14.3*  HGB 14.2 13.2 14.0 12.8 11.0*  HCT 44.4 41.7 44.1 41.1 35.9*  MCV 81.2 81.8 81.5 83.2 84.9  PLT 322 308 295 292 287   Cardiac Enzymes: No results for input(s): "CKTOTAL", "CKMB", "CKMBINDEX", "TROPONINI" in the last 168 hours. BNP: Invalid input(s): "POCBNP" CBG: No results for input(s): "GLUCAP" in the last 168 hours. D-Dimer No results for input(s): "DDIMER" in the last 72 hours. Hgb A1c No results for input(s): "HGBA1C" in the last 72 hours. Lipid Profile No results for input(s): "CHOL", "HDL", "LDLCALC", "TRIG", "CHOLHDL", "LDLDIRECT" in the last 72 hours. Thyroid function studies No results for input(s): "TSH", "T4TOTAL", "T3FREE", "THYROIDAB" in the last 72 hours.  Invalid input(s): "FREET3" Anemia work up No results for input(s): "VITAMINB12", "FOLATE", "FERRITIN", "TIBC", "IRON", "RETICCTPCT" in the last 72  hours. Urinalysis    Component Value Date/Time   COLORURINE YELLOW 08/14/2019 1010   APPEARANCEUR CLEAR 08/14/2019 1010   LABSPEC 1.025 08/14/2019 1010   PHURINE 5.0 08/14/2019 1010   GLUCOSEU NEGATIVE 08/14/2019 1010   HGBUR SMALL (A) 08/14/2019 1010   BILIRUBINUR NEGATIVE 08/14/2019 1010   KETONESUR NEGATIVE 08/14/2019 1010   PROTEINUR NEGATIVE 08/14/2019 1010   NITRITE NEGATIVE 08/14/2019 1010   LEUKOCYTESUR NEGATIVE 08/14/2019 1010   Sepsis Labs Recent Labs  Lab 01/16/24 0539 01/17/24 0526 01/18/24 0530 01/19/24 0608  WBC 10.1 17.7* 19.1* 14.3*   Microbiology No results found for this or any previous visit (from the past 240 hours).   Time coordinating discharge:  38 minutes  SIGNED:  Barbee Lew, MD  [facility] 01/19/2024, 4:08 PM

## 2024-01-19 NOTE — Progress Notes (Signed)
   01/19/24 0940  TOC Brief Assessment  Insurance and Status Reviewed  Patient has primary care physician Yes  Home environment has been reviewed single family home  Prior level of function: independent  Prior/Current Home Services No current home services  Social Drivers of Health Review SDOH reviewed no interventions necessary  Readmission risk has been reviewed Yes  Transition of care needs no transition of care needs at this time

## 2024-01-19 NOTE — Plan of Care (Signed)

## 2024-01-19 NOTE — Progress Notes (Signed)
 AVS reviewed w/ pt who verbalized an understanding, JP drain teaching also completed - teach back method. PIV removed as noted . Pt showering prior to leaving- Daughter is returning w/ clothes for home. Pain  4/10- Tylenol given as noted.

## 2024-01-29 NOTE — Progress Notes (Signed)
 Radiation Oncology         [phone] ________________________________  Name: Emma Medina MRN: [id number]  Date: 01/30/2024  DOB: [birth year]  Follow-Up Visit Note  CC: Placey, Kevon Pellegrini, NP  Placey, Kevon Pellegrini, NP  No diagnosis found.  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  She was last seen here for follow-up on 01/24/24.             Since her last visit, the patient followed up with Dr. Gaylin Ke on 07/13/23.  During which time, she denied any symptoms concerning for disease recurrence and she was noted as NED examination.   In recent history, she was unfortunately hospitalized from 01/15/24 through 01/19/24 with acute calculous cholecystitis after presenting to the ED with chest pain and emesis. Imaging performed while inpatient includes:  -- Abdominal US  on 01/15/24 which showed moderate dilation of the common bile duct measuring 14 mm and cholecystolithiasis in the biliary sludge in the gallbladder without evidence of acute cholecystitis.  -- CTA CAP on 04/13 showed: cholelithiasis with a dilated gallbladder and questionable mild gallbladder wall edema; new soft tissue in the upper anterior mediastinum possibly representing thymic hyperplasia; new indeterminate 3-4 mm pulmonary nodules in the right upper lobe; and mild ground-glass opacities in the inferior right middle lobe and right lower lobe possibly representing an infectious/inflammatory process. CT findings otherwise showed no evidence of aortic dissection or aneurysm. -- MRI of the abdomen & MRCP on 04/14 showed: evidence of acute  cholecystitis without choledocholithiasis or bile duct dilation; and mild hepatomegaly with moderate diffuse hepatic steatosis.   Hospital course overall consisted of ceftriaxone , metronidazole , and a cholecystectomy w/ hernia repair on 04/15.   Other imaging performed in the interval since her last visit includes a bilateral diagnostic mammogram and right breast ultrasound on 08/10/23 (due to a history of mastodynia) which demonstrated a likely reactive right axillary lymph node with cortical thickening measuring up to 5 mm in thickness. No other abnormalities were appreciated in either breast.          No other significant interval history since the patient was last seen for follow-up.   ***  Allergies:  has no known allergies.  Meds: Current Outpatient Medications  Medication Sig Dispense Refill   acetaminophen  (TYLENOL ) 500 MG tablet Take 2 tablets (1,000 mg total) by mouth every 6 (six) hours as needed for mild pain (pain score 1-3) or moderate pain (pain score 4-6).     aspirin  EC 81 MG EC tablet Take 1 tablet (81 mg total) by mouth daily.     ondansetron  (ZOFRAN ) 4 MG tablet Take 1 tablet (4 mg total) by mouth daily as needed for nausea or vomiting. 30 tablet 1   polyethylene glycol (MIRALAX  / GLYCOLAX ) 17 g packet Take 17 g by mouth daily as needed for mild constipation.     rivaroxaban  (XARELTO ) 20 MG TABS tablet Take 1 tablet (20 mg total) by mouth daily with supper. 90 tablet 2   No current facility-administered medications for this encounter.    Physical Findings: The patient is in no acute distress. Patient  is alert and oriented.  vitals were not taken for this visit. .  No significant changes. Lungs are clear to auscultation bilaterally. Heart has regular rate and rhythm. No palpable cervical, supraclavicular, or axillary adenopathy. Abdomen soft, non-tender, normal bowel sounds.  On pelvic examination the external genitalia were unremarkable. A speculum exam was performed.  There are no mucosal lesions noted in the vaginal vault. A Pap smear was obtained of the proximal vagina. On bimanual and rectovaginal examination there were no pelvic masses appreciated. ***   Lab Findings: Lab Results  Component Value Date   WBC 14.3 (H) 01/19/2024   HGB 11.0 (L) 01/19/2024   HCT 35.9 (L) 01/19/2024   MCV 84.9 01/19/2024   PLT 287 01/19/2024    Radiographic Findings: MR 3D Recon At Scanner Result Date: 01/16/2024 CLINICAL DATA:  Nonspecific (abnormal) findings on radiological and other examination of musculoskeletal system. [id number] Pain [phone] Eval for Choledocholithiasis EXAM: 3-DIMENSIONAL MR IMAGE RENDERING ON ACQUISITION WORKSTATION TECHNIQUE: 3-dimensional MR images were rendered by post-processing of the original MR data on an acquisition workstation. The 3-dimensional MR images were interpreted and findings were reported in the accompanying complete MR report for this study COMPARISON:  None Available. FINDINGS: Please refer to simultaneously acquired but separately reported MRI abdomen report for details per IMPRESSION: 3-dimensional MRCP images were rendered from the original MR data. Electronically Signed   By: Beula Brunswick M.D.   On: 01/16/2024 08:57   MR ABDOMEN MRCP W WO CONTAST Result Date: 01/16/2024 CLINICAL DATA:  Eval for Choledocholithiasis. Chest pain and vomiting with shortness of breath. History of squamous cell carcinoma of the cervix. EXAM: MRI ABDOMEN WITHOUT AND WITH CONTRAST (INCLUDING MRCP) TECHNIQUE: Multiplanar multisequence MR imaging of the abdomen was performed both before and after the administration of intravenous contrast. Heavily T2-weighted images of the biliary and pancreatic ducts were obtained, and three-dimensional MRCP images were rendered by post processing. CONTRAST:  10mL GADAVIST  GADOBUTROL  1 MMOL/ML IV SOLN COMPARISON:  Ultrasound and CT scan chest, abdomen pelvis from yesterday-01/15/2024. FINDINGS: Lower chest: Unremarkable MR  appearance to the lung bases. No pleural effusion. No pericardial effusion. Normal heart size. Hepatobiliary: The liver is mildly enlarged in size (length 17.4 cm). Noncirrhotic configuration. No focal lesion. Moderate diffuse hepatic steatosis. No intra or extrahepatic bile duct dilation. Extrahepatic bile duct measures 4-6 mm in diameter. No choledocholithiasis. Markedly distended and dilated gallbladder measuring 18.8 cm in length and up to 5.6 cm in diameter. There is mild gallbladder wall thickening with moderate pericholecystic free fluid and mild to moderate pericholecystic fat stranding. Pancreas: No mass, inflammatory changes or other parenchymal abnormality identified. No main pancreatic duct dilation. Spleen:  Within normal limits in size and appearance. No focal mass. Adrenals/Urinary Tract: Unremarkable adrenal glands. No hydroureteronephrosis. No suspicious renal mass. Stomach/Bowel: Visualized portions within the abdomen are unremarkable. No disproportionate dilation of bowel loops. Vascular/Lymphatic: No pathologically enlarged lymph nodes identified. No abdominal aortic aneurysm demonstrated. There is trace perihepatic and perisplenic ascites. Other:  There is a small fat containing periumbilical hernia. Musculoskeletal: No suspicious bone lesions identified. IMPRESSION: 1. Findings compatible with acute cholecystitis. No choledocholithiasis or bile duct dilation. 2. Mild hepatomegaly with moderate diffuse hepatic steatosis. 3. Multiple other nonacute observations, as described above. Electronically Signed   By: Beula Brunswick M.D.   On: 01/16/2024 08:51   CT Angio Chest/Abd/Pel for Dissection W and/or W/WO Result Date: 01/15/2024 CLINICAL DATA:  Chest pain and vomiting with shortness of breath. History of squamous cell carcinoma of  the cervix. EXAM: CT ANGIOGRAPHY CHEST, ABDOMEN AND PELVIS TECHNIQUE: Non-contrast CT of the chest was initially obtained. Multidetector CT imaging through the chest,  abdomen and pelvis was performed using the standard protocol during bolus administration of intravenous contrast. Multiplanar reconstructed images and MIPs were obtained and reviewed to evaluate the vascular anatomy. RADIATION DOSE REDUCTION: This exam was performed according to the departmental dose-optimization program which includes automated exposure control, adjustment of the mA and/or kV according to patient size and/or use of iterative reconstruction technique. CONTRAST:  OMNIPAQUE  IOHEXOL  350 MG/ML SOLN COMPARISON:  Ultrasound abdomen 01/15/2024. CT abdomen and pelvis 02/16/2023. PET CT 02/17/2021. FINDINGS: CTA CHEST FINDINGS Cardiovascular: Preferential opacification of the thoracic aorta. No evidence of thoracic aortic aneurysm or dissection. Normal heart size. No pericardial effusion. Mediastinum/Nodes: No enlarged mediastinal, hilar, or axillary lymph nodes. Thyroid gland, trachea, and esophagus demonstrate no significant findings. There is soft tissue in the upper anterior mediastinum which is new from 2022. Lungs/Pleura: There are minimal patchy ground-glass opacities in the inferior right middle lobe and right lower lobe. There is no pleural effusion or pneumothorax. There are few new pulmonary nodules in the right upper lobe measuring 3-4 mm. Musculoskeletal: No chest wall abnormality. No acute or significant osseous findings. Review of the MIP images confirms the above findings. CTA ABDOMEN AND PELVIS FINDINGS VASCULAR Aorta: Normal caliber aorta without aneurysm, dissection, vasculitis or significant stenosis. Celiac: Patent without evidence of aneurysm, dissection, vasculitis or significant stenosis. SMA: Patent without evidence of aneurysm, dissection, vasculitis or significant stenosis. Renals: Both renal arteries are patent without evidence of aneurysm, dissection, vasculitis, fibromuscular dysplasia or significant stenosis. IMA: Patent without evidence of aneurysm, dissection,  vasculitis or significant stenosis. Inflow: Patent without evidence of aneurysm, dissection, vasculitis or significant stenosis. Veins: No obvious venous abnormality within the limitations of this arterial phase study. Review of the MIP images confirms the above findings. NON-VASCULAR Hepatobiliary: Multiple gallstones are present. The gallbladder is dilated. There is some questionable mild gallbladder wall edema. There is no biliary ductal dilatation. No focal liver lesions are identified. There is no surrounding inflammation. Pancreas: Unremarkable. No pancreatic ductal dilatation or surrounding inflammatory changes. Spleen: Normal in size without focal abnormality. Adrenals/Urinary Tract: Adrenal glands are unremarkable. Kidneys are normal, without renal calculi, focal lesion, or hydronephrosis. Bladder is unremarkable. Stomach/Bowel: Stomach is within normal limits. Appendix appears normal. No evidence of bowel wall thickening, distention, or inflammatory changes. Lymphatic: No enlarged lymph nodes are identified. There is some prominent inguinal lymph nodes bilaterally. Reproductive: Uterus and bilateral adnexa are unremarkable. Other: There is a small to moderate size fat containing umbilical hernia. There is no ascites. There is mild body wall edema. Musculoskeletal: Degenerative changes affect the spine. Review of the MIP images confirms the above findings. IMPRESSION: 1. No evidence for aortic dissection or aneurysm. 2. Cholelithiasis with dilated gallbladder and questionable mild gallbladder wall edema. Correlate clinically for acute cholecystitis. 3. Soft tissue in the upper anterior mediastinum is new from 2022. This could represent thymic hyperplasia, but other etiologies are not excluded. Recommend clinical correlation and follow-up. 4. New 3-4 mm pulmonary nodules in the right upper lobe, indeterminate. Follow-up recommended. 5. Mild ground-glass opacities in the inferior right middle lobe and right  lower lobe which may represent infectious/inflammatory process. Electronically Signed   By: Tyron Gallon M.D.   On: 01/15/2024 18:40   US  Abdomen Limited RUQ (LIVER/GB) Result Date: 01/15/2024 CLINICAL DATA:  [id number] RUQ discomfort [id number] EXAM: ULTRASOUND ABDOMEN LIMITED RIGHT UPPER QUADRANT COMPARISON:  Feb 16, 2023 FINDINGS: Technically difficult exam due to patient's body habitus and overlying bowel gas. Gallbladder: Biliary sludge and multiple gallstones filling the gallbladder. No sonographic Murphy sign or wall thickening. No pericholecystic fluid. Common bile duct: Diameter: 14 mm Liver: No focal lesion identified. Increased echogenicity. Portal vein is patent on color Doppler imaging with normal direction of blood flow towards the liver. Other: None. IMPRESSION: 1. Moderate dilation of the common bile duct measuring 14 mm. Correlation with serum bilirubin recommended. A nonemergent multiphase abdominal MRI with IV contrast is recommended to evaluate for downstream obstruction. 2. Cholecystolithiasis in biliary sludge in the gallbladder. No findings of acute cholecystitis. 3. Hepatic steatosis. Electronically Signed   By: Rance Burrows M.D.   On: 01/15/2024 16:24   DG Chest 2 View Result Date: 01/15/2024 CLINICAL DATA:  Chest pain EXAM: CHEST - 2 VIEW COMPARISON:  December 07, 2019 FINDINGS: Low lung volumes. No focal airspace consolidation, pleural effusion, or pneumothorax. No cardiomegaly. No acute fracture or destructive lesion. Multilevel thoracic osteophytosis. IMPRESSION: No acute cardiopulmonary abnormality. Electronically Signed   By: Rance Burrows M.D.   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  ***  Plan:  ***   *** minutes of total time was spent for this patient encounter, including preparation, face-to-face counseling with the patient and coordination of care, physical  exam, and documentation of the encounter. ____________________________________  Noralee Beam, PhD, MD  This document serves as a record of services personally performed by Retta Caster, MD. It was created on his behalf by Aleta Anda, a trained medical scribe. The creation of this record is based on the scribe's personal observations and the provider's statements to them. This document has been checked and approved by the attending provider.

## 2024-01-30 ENCOUNTER — Encounter: Payer: Self-pay | Admitting: Radiation Oncology

## 2024-01-30 ENCOUNTER — Ambulatory Visit
Admission: RE | Admit: 2024-01-30 | Discharge: 2024-01-30 | Disposition: A | Discharge status: Home / Self Care | Payer: Self-pay | Source: Ambulatory Visit | Attending: Radiation Oncology | Admitting: Radiation Oncology

## 2024-01-30 VITALS — BP 144/92 | HR 66 | Temp 97.2°F | Resp 18 | Ht 67.0 in | Wt 229.8 lb

## 2024-01-30 DIAGNOSIS — Z7982 Long term (current) use of aspirin: Secondary | ICD-10-CM | POA: Diagnosis not present

## 2024-01-30 DIAGNOSIS — R079 Chest pain, unspecified: Secondary | ICD-10-CM | POA: Diagnosis not present

## 2024-01-30 DIAGNOSIS — R609 Edema, unspecified: Secondary | ICD-10-CM | POA: Insufficient documentation

## 2024-01-30 DIAGNOSIS — K760 Fatty (change of) liver, not elsewhere classified: Secondary | ICD-10-CM | POA: Insufficient documentation

## 2024-01-30 DIAGNOSIS — R918 Other nonspecific abnormal finding of lung field: Secondary | ICD-10-CM | POA: Insufficient documentation

## 2024-01-30 DIAGNOSIS — K429 Umbilical hernia without obstruction or gangrene: Secondary | ICD-10-CM | POA: Insufficient documentation

## 2024-01-30 DIAGNOSIS — R160 Hepatomegaly, not elsewhere classified: Secondary | ICD-10-CM | POA: Diagnosis not present

## 2024-01-30 DIAGNOSIS — R0602 Shortness of breath: Secondary | ICD-10-CM | POA: Diagnosis not present

## 2024-01-30 DIAGNOSIS — K8020 Calculus of gallbladder without cholecystitis without obstruction: Secondary | ICD-10-CM | POA: Insufficient documentation

## 2024-01-30 DIAGNOSIS — Z923 Personal history of irradiation: Secondary | ICD-10-CM | POA: Diagnosis not present

## 2024-01-30 DIAGNOSIS — C538 Malignant neoplasm of overlapping sites of cervix uteri: Secondary | ICD-10-CM

## 2024-01-30 DIAGNOSIS — Z7901 Long term (current) use of anticoagulants: Secondary | ICD-10-CM | POA: Insufficient documentation

## 2024-01-30 DIAGNOSIS — Z8541 Personal history of malignant neoplasm of cervix uteri: Secondary | ICD-10-CM | POA: Diagnosis present

## 2024-01-30 NOTE — Progress Notes (Signed)
 Emma Medina is here today for follow up post radiation to the pelvic.  They completed their radiation on: 09/11/2019  Does the patient complain of any of the following:  Pain:Denies Abdominal bloating: Denies Diarrhea/Constipation: Denies Nausea/Vomiting: Denies Vaginal Discharge: Denies Blood in Urine or Stool: Denies Urinary Issues (dysuria/incomplete emptying/ incontinence/ increased frequency/urgency): Denies Does patient report using vaginal dilator 2-3 times a week and/or sexually active 2-3 weeks: Denies Post radiation skin changes: Denies   BP (!) 144/92 (BP Location: Right Arm, Patient Position: Sitting, Cuff Size: Large)   Pulse 66   Temp (!) 97.2 F (36.2 C)   Resp 18   Ht 5\' 7"  (1.702 m)   Wt 229 lb 12.8 oz (104.2 kg)   LMP 06/13/2019 Comment: radiation tx cervical ca  SpO2 100%   BMI 35.99 kg/m

## 2024-03-12 ENCOUNTER — Other Ambulatory Visit: Payer: Self-pay

## 2024-03-12 DIAGNOSIS — I779 Disorder of arteries and arterioles, unspecified: Secondary | ICD-10-CM

## 2024-03-28 ENCOUNTER — Ambulatory Visit

## 2024-03-28 ENCOUNTER — Encounter (HOSPITAL_COMMUNITY)

## 2024-05-04 ENCOUNTER — Other Ambulatory Visit: Payer: Self-pay | Admitting: Vascular Surgery

## 2024-05-04 DIAGNOSIS — I779 Disorder of arteries and arterioles, unspecified: Secondary | ICD-10-CM

## 2024-05-04 DIAGNOSIS — I998 Other disorder of circulatory system: Secondary | ICD-10-CM

## 2024-05-30 ENCOUNTER — Ambulatory Visit (HOSPITAL_COMMUNITY): Payer: Self-pay

## 2024-05-30 ENCOUNTER — Ambulatory Visit: Payer: Self-pay

## 2024-07-25 ENCOUNTER — Encounter: Payer: Self-pay | Admitting: Obstetrics & Gynecology

## 2024-08-01 ENCOUNTER — Inpatient Hospital Stay: Payer: Self-pay | Attending: Obstetrics & Gynecology | Admitting: Obstetrics & Gynecology

## 2024-08-01 VITALS — BP 132/88 | HR 76 | Temp 98.5°F | Resp 19 | Wt 224.0 lb

## 2024-08-01 DIAGNOSIS — Z923 Personal history of irradiation: Secondary | ICD-10-CM | POA: Insufficient documentation

## 2024-08-01 DIAGNOSIS — C538 Malignant neoplasm of overlapping sites of cervix uteri: Secondary | ICD-10-CM

## 2024-08-01 DIAGNOSIS — Z8541 Personal history of malignant neoplasm of cervix uteri: Secondary | ICD-10-CM | POA: Insufficient documentation

## 2024-08-01 DIAGNOSIS — Z9221 Personal history of antineoplastic chemotherapy: Secondary | ICD-10-CM | POA: Insufficient documentation

## 2024-08-01 NOTE — Patient Instructions (Addendum)
  VISIT SUMMARY: You came in today for a follow-up visit after being released from Dr. Colton practice. You reported no bleeding, pelvic pain, or issues with bowel and bladder functions. Your weight is stable, and your energy levels are adequate.  YOUR PLAN: -CERVICAL CANCER WITH RADIATION-INDUCED CHANGES: Radiation-induced changes are consistent with your prior treatment for cervical cancer. There are no new lesions, and your examination aligns with expected post-radiation changes. This is a positive sign, especially as you have reached the five-year milestone post-treatment. We will perform a Pap test.   INSTRUCTIONS: Please follow up for the Pap test as discussed. Continue to monitor your health and report any new symptoms or concerns.                      Contains text generated by [employer].                                 Contains text generated by [employer].

## 2024-08-01 NOTE — Assessment & Plan Note (Addendum)
 [name]  is a 53 y.o.  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  New onset nausea--no emesis, weight loss.  Normal exam.     Continue annual Pap testing Annual follow up with a generalist is appropriate

## 2024-08-01 NOTE — Progress Notes (Unsigned)
 Follow Up Note: Gyn-Onc  Emma Medina 53 y.o. female  CC: She presents for a f/u visit   HPI: The oncology history was reviewed.  Interval History:   She was seen in f/u by Dr. Shannon in 4/25 and was felt to be clinically NED. She was discharged from there clinic.  She denies any vaginal bleeding, abdominal/pelvic pain, leg pain, urinary symptoms, cough or weight loss.  New onset nausea qod x 1 mth.   A Pap in 10/24 showed NILM.   Review of Systems  Review of Systems  Constitutional:  Negative for malaise/fatigue and weight loss.  Respiratory:  Negative for shortness of breath and wheezing.   Cardiovascular:  Negative for chest pain and leg swelling.  Gastrointestinal:  Negative for abdominal pain, blood in stool, constipation, vomiting; Positive for nausea  Genitourinary:  Negative for dysuria, frequency, hematuria and urgency.  Musculoskeletal:  Negative for joint pain and myalgias.  Neurological:  Negative for weakness.  Psychiatric/Behavioral:  Negative for depression. The patient does not have insomnia.    Current medications, allergy, social history, past surgical history, past medical history, family history were all reviewed.    Vitals:  LMP 06/13/2019 Comment: radiation tx cervical ca   Physical Exam:  Physical Exam Exam conducted with a chaperone present.  Constitutional:      General: She is not in acute distress. Cardiovascular:     Rate and Rhythm: Normal rate and regular rhythm.  Pulmonary:     Effort: Pulmonary effort is normal.     Breath sounds: Normal breath sounds. No wheezing or rhonchi.  Abdominal:     Palpations: Abdomen is soft.     Tenderness: There is no abdominal tenderness. There is no right CVA tenderness or left CVA tenderness.     Hernia: No hernia is present.  Genitourinary:    General: Normal vulva.     Urethra: No urethral lesion.     Vagina: No lesions. No bleeding  Cevix: anterior lip visible--no palpable  abnormality Musculoskeletal:     Cervical back: Neck supple.     Right lower leg: No edema.     Left lower leg: No edema.  Lymphadenopathy:     Upper Body:     Right upper body: No supraclavicular adenopathy.     Left upper body: No supraclavicular adenopathy.     Lower Body: No right inguinal adenopathy. No left inguinal adenopathy.  Skin:    Findings: No rash.  Neurological:     Mental Status: She is oriented to person, place, and time.   Assessment/Plan:  No problem-specific Assessment & Plan notes found for this encounter.     I personally spent 25 minutes face-to-face and non-face-to-face in the care of this patient, which includes all pre, intra, and post visit time on the date of service.     [place]lam [place], MD

## 2024-08-02 ENCOUNTER — Encounter: Payer: Self-pay | Admitting: Obstetrics & Gynecology

## 2024-08-08 LAB — CYTOLOGY - PAP
Adequacy: ABSENT
Comment: NEGATIVE
Diagnosis: NEGATIVE
High risk HPV: NEGATIVE

## 2024-08-13 ENCOUNTER — Emergency Department (HOSPITAL_COMMUNITY): Admission: EM | Admit: 2024-08-13 | Discharge: 2024-08-13 | Disposition: A | Discharge status: Home / Self Care

## 2024-08-13 ENCOUNTER — Encounter (HOSPITAL_COMMUNITY): Payer: Self-pay

## 2024-08-13 ENCOUNTER — Emergency Department (HOSPITAL_COMMUNITY)

## 2024-08-13 ENCOUNTER — Other Ambulatory Visit: Payer: Self-pay

## 2024-08-13 DIAGNOSIS — Z8541 Personal history of malignant neoplasm of cervix uteri: Secondary | ICD-10-CM | POA: Insufficient documentation

## 2024-08-13 DIAGNOSIS — M5440 Lumbago with sciatica, unspecified side: Secondary | ICD-10-CM | POA: Diagnosis present

## 2024-08-13 DIAGNOSIS — Z7982 Long term (current) use of aspirin: Secondary | ICD-10-CM | POA: Diagnosis not present

## 2024-08-13 MED ORDER — HYDROCODONE-ACETAMINOPHEN 5-325 MG PO TABS: 1.0000 | ORAL_TABLET | Freq: Four times a day (QID) | ORAL | 0 refills | Status: AC | PRN

## 2024-08-13 MED ORDER — METHOCARBAMOL 500 MG PO TABS: 500.0000 mg | ORAL_TABLET | Freq: Two times a day (BID) | ORAL | 0 refills | Status: AC

## 2024-08-13 MED ORDER — METHYLPREDNISOLONE SODIUM SUCC 125 MG IJ SOLR
125.0000 mg | Freq: Once | INTRAMUSCULAR | Status: AC
  Administered 2024-08-13: 125 mg via INTRAMUSCULAR
  Filled 2024-08-13: qty 2

## 2024-08-13 NOTE — ED Provider Notes (Signed)
 [place] EMERGENCY DEPARTMENT AT [facility] Provider Note   CSN: [id number] Arrival date & time: 08/13/24  1236     Patient presents with: Back Pain   Emma Medina is a 53 y.o. female.   Pt complains of low back pain.  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  Patient states she began having pain on Friday.  Patient reports the pain is worse when she sits or when she leans forward.  Patient reports pain when trying to pull herself up.  Patient reports pain is relieved with lying on her side or leaning forward.  Patient states she has not had any injury to her back.  Patient has a history of cervical cancer.  Patient states that she has been cancer free for the past 5 years.  Patient denies any urinary complaints she has not had any fever or chills.  No rash  The history is provided by the patient. No language interpreter was used.  Back Pain      Prior to Admission medications   Medication Sig Start Date End Date Taking? Authorizing Provider  HYDROcodone -acetaminophen  (NORCO/VICODIN) 5-325 MG tablet Take 1 tablet by mouth every 6 (six) hours as needed for up to 3 days for moderate pain (pain score 4-6). 08/13/24 08/16/24 Yes Flint Sonny POUR, PA-C  methocarbamol  (ROBAXIN ) 500 MG tablet Take 1 tablet (500 mg total) by mouth 2 (two) times daily. 08/13/24  Yes Sharman Garrott K, PA-C  acetaminophen  (TYLENOL ) 500 MG tablet Take 2 tablets (1,000 mg total) by mouth every 6 (six) hours as needed for mild pain (pain score 1-3) or moderate pain (pain score 4-6). 01/19/24   Rosalba Glendale DEL, PA-C  aspirin  EC 81 MG EC tablet Take 1 tablet (81 mg total) by mouth daily. 05/23/18   Gerome Maurilio HERO, PA-C  cyclobenzaprine  (FLEXERIL ) 10 MG tablet Take 10 mg by mouth 3 (three) times daily.    [provider]  rivaroxaban  (XARELTO ) 20 MG TABS tablet Take 1 tablet (20 mg total) by mouth daily with supper. 01/06/24   Sheree Penne Bruckner, MD    Allergies:  Patient has no known allergies.    Review of Systems  Musculoskeletal:  Positive for back pain.  All other systems reviewed and are negative.   Updated Vital Signs BP (!) 122/95 (BP Location: Left Arm)   Pulse 94   Temp 98.7 F (37.1 C) (Oral)   Resp 18   LMP 06/13/2019 Comment: radiation tx cervical ca  SpO2 100%   Physical Exam Vitals and nursing note reviewed.  Constitutional:      Appearance: She is well-developed.  HENT:     Head: Normocephalic.  Cardiovascular:     Rate and Rhythm: Normal rate.  Pulmonary:     Effort: Pulmonary effort is normal.  Abdominal:     General: There is no distension.  Musculoskeletal:        General: Normal range of motion.     Cervical back: Normal range of motion.  Skin:    General: Skin is warm.  Neurological:     General: No focal deficit present.     Mental Status: She is alert and oriented to person, place, and time.  Psychiatric:        Mood and Affect: Mood normal.     (all labs ordered are listed, but only abnormal results are displayed) Labs Reviewed - No data to display  EKG: None  Radiology: DG Lumbar Spine  Complete Result Date: 08/13/2024 CLINICAL DATA:  Lower back pain radiating to the hips and thighs. EXAM: LUMBAR SPINE - COMPLETE 4+ VIEW COMPARISON:  None Available. FINDINGS: Very mild levoconvex curvature of the spine. Alignment is otherwise anatomic. Vertebral body heights are maintained. Endplate degenerative changes from L2-3 and L4-5. Loss of disc space height at L4-5. Facet hypertrophy in the mid and lower lumbar spine. No definite pars defects. 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 Electronically Signed   By: Newell Eke M.D.   On: 08/13/2024 15:05     Procedures   Medications Ordered in the ED  methylPREDNISolone sodium succinate (SOLU-MEDROL) 125 mg/2 mL injection 125 mg (125 mg Intramuscular Given 08/13/24 1439)                                     Medical Decision Making Patient complains of low back pain.  No injury.  Patient reports that she has a history of arthritis in her back.  Amount and/or Complexity of Data Reviewed Radiology: ordered and independent interpretation performed. Decision-making details documented in ED Course.    Details: LS spine degenerative l4-l5   Risk Prescription drug management. Risk Details: Pt given Solumedrol IM.  Rx for hydrocodone  and robaxin .         Final diagnoses:  Acute midline low back pain with sciatica, sciatica laterality unspecified    ED Discharge Orders          Ordered    HYDROcodone -acetaminophen  (NORCO/VICODIN) 5-325 MG tablet  Every 6 hours PRN        08/13/24 1516    methocarbamol  (ROBAXIN ) 500 MG tablet  2 times daily        08/13/24 1516            An After Visit Summary was printed and given to the patient.    Flint Sonny POUR, PA-C 08/13/24 1527    Ula Prentice SAUNDERS, MD 08/14/24 [phone]

## 2024-08-13 NOTE — Discharge Instructions (Addendum)
Return if any problems.  See your Physicain for recheck in 3-4 days.

## 2024-08-13 NOTE — ED Triage Notes (Addendum)
 Pt reports with lower mid back pain that started Friday. Pt states that its going down her hips and thighs. Pt ambulatory to triage.

## 2024-08-15 ENCOUNTER — Ambulatory Visit (HOSPITAL_COMMUNITY): Payer: Self-pay

## 2024-08-15 ENCOUNTER — Ambulatory Visit: Payer: Self-pay

## 2024-08-17 ENCOUNTER — Other Ambulatory Visit: Payer: Self-pay | Admitting: Gynecologic Oncology

## 2024-08-17 ENCOUNTER — Telehealth: Payer: Self-pay

## 2024-08-17 DIAGNOSIS — B9689 Other specified bacterial agents as the cause of diseases classified elsewhere: Secondary | ICD-10-CM

## 2024-08-17 MED ORDER — METRONIDAZOLE 500 MG PO TABS: 500.0000 mg | ORAL_TABLET | Freq: Two times a day (BID) | ORAL | 0 refills | Status: AC

## 2024-08-17 NOTE — Telephone Encounter (Signed)
 Emma Medina is aware of the recent pap smear results as reported by Eleanor Epps NP.  She states she is having S&S of BV (d/c w/odor) but thinks it is because she is using fragrant soaps. She is fine with taking the Flagyl  and aware of need to avoid alcohol while taking Flagyl .  May send prescription to [facility] in chart.

## 2024-08-17 NOTE — Telephone Encounter (Signed)
-----   Message from Emma Medina sent at 08/17/2024  9:22 AM EST ----- Please let her know her pap smear is negative for precancer or cancer. HPV high risk is not detected. There are findings on the pap suggestive of bacteria vaginosis.  We can send in treatment for this which includes flagyl  if she is able to take this. She would need to avoid alcohol use with the flagyl  since that can make you vomit and feel sick.

## 2024-08-17 NOTE — Progress Notes (Signed)
 Being treated for BV based on pap results.

## 2024-09-11 ENCOUNTER — Encounter: Payer: Self-pay | Admitting: Vascular Surgery

## 2024-09-12 ENCOUNTER — Other Ambulatory Visit: Payer: Self-pay

## 2024-09-12 ENCOUNTER — Other Ambulatory Visit: Payer: Self-pay | Admitting: Vascular Surgery

## 2024-09-12 MED ORDER — DABIGATRAN ETEXILATE MESYLATE 150 MG PO CAPS: 150.0000 mg | ORAL_CAPSULE | Freq: Two times a day (BID) | ORAL | 3 refills | Status: DC

## 2024-09-19 ENCOUNTER — Other Ambulatory Visit: Payer: Self-pay

## 2024-09-19 DIAGNOSIS — I998 Other disorder of circulatory system: Secondary | ICD-10-CM

## 2024-09-19 DIAGNOSIS — I779 Disorder of arteries and arterioles, unspecified: Secondary | ICD-10-CM

## 2024-09-19 MED ORDER — RIVAROXABAN 20 MG PO TABS: 20.0000 mg | ORAL_TABLET | Freq: Every day | ORAL | 2 refills | Status: AC

## 2024-10-29 ENCOUNTER — Emergency Department (HOSPITAL_COMMUNITY)
Admission: EM | Admit: 2024-10-29 | Discharge: 2024-10-29 | Disposition: A | Discharge status: Home / Self Care | Attending: Emergency Medicine | Admitting: Emergency Medicine

## 2024-10-29 ENCOUNTER — Emergency Department (HOSPITAL_COMMUNITY)

## 2024-10-29 DIAGNOSIS — Z23 Encounter for immunization: Secondary | ICD-10-CM | POA: Insufficient documentation

## 2024-10-29 DIAGNOSIS — W540XXA Bitten by dog, initial encounter: Secondary | ICD-10-CM | POA: Diagnosis not present

## 2024-10-29 DIAGNOSIS — S61432A Puncture wound without foreign body of left hand, initial encounter: Secondary | ICD-10-CM | POA: Diagnosis not present

## 2024-10-29 DIAGNOSIS — S80211A Abrasion, right knee, initial encounter: Secondary | ICD-10-CM | POA: Diagnosis not present

## 2024-10-29 DIAGNOSIS — S80212A Abrasion, left knee, initial encounter: Secondary | ICD-10-CM | POA: Diagnosis not present

## 2024-10-29 DIAGNOSIS — S62336A Displaced fracture of neck of fifth metacarpal bone, right hand, initial encounter for closed fracture: Secondary | ICD-10-CM | POA: Diagnosis not present

## 2024-10-29 DIAGNOSIS — S62339A Displaced fracture of neck of unspecified metacarpal bone, initial encounter for closed fracture: Secondary | ICD-10-CM

## 2024-10-29 DIAGNOSIS — S61532A Puncture wound without foreign body of left wrist, initial encounter: Secondary | ICD-10-CM | POA: Diagnosis not present

## 2024-10-29 DIAGNOSIS — S6991XA Unspecified injury of right wrist, hand and finger(s), initial encounter: Secondary | ICD-10-CM | POA: Diagnosis present

## 2024-10-29 DIAGNOSIS — Z7982 Long term (current) use of aspirin: Secondary | ICD-10-CM | POA: Diagnosis not present

## 2024-10-29 MED ORDER — MORPHINE SULFATE (PF) 4 MG/ML IV SOLN
4.0000 mg | Freq: Once | INTRAVENOUS | Status: AC
  Administered 2024-10-29: 4 mg via INTRAVENOUS
  Filled 2024-10-29: qty 1

## 2024-10-29 MED ORDER — TETANUS-DIPHTH-ACELL PERTUSSIS 5-2-15.5 LF-MCG/0.5 IM SUSP
0.5000 mL | Freq: Once | INTRAMUSCULAR | Status: AC
  Administered 2024-10-29: 0.5 mL via INTRAMUSCULAR
  Filled 2024-10-29: qty 0.5

## 2024-10-29 MED ORDER — OXYCODONE-ACETAMINOPHEN 5-325 MG PO TABS: 1.0000 | ORAL_TABLET | Freq: Four times a day (QID) | ORAL | 0 refills | Status: AC | PRN

## 2024-10-29 MED ORDER — AMOXICILLIN-POT CLAVULANATE 875-125 MG PO TABS: 1.0000 | ORAL_TABLET | Freq: Two times a day (BID) | ORAL | 0 refills | Status: AC

## 2024-10-29 MED ORDER — ONDANSETRON 4 MG PO TBDP
4.0000 mg | ORAL_TABLET | Freq: Once | ORAL | Status: AC
  Administered 2024-10-29: 4 mg via ORAL
  Filled 2024-10-29: qty 1

## 2024-10-29 MED ORDER — HYDROCODONE-ACETAMINOPHEN 5-325 MG PO TABS
1.0000 | ORAL_TABLET | Freq: Once | ORAL | Status: AC
  Administered 2024-10-29: 1 via ORAL
  Filled 2024-10-29: qty 1

## 2024-10-29 MED ORDER — AMOXICILLIN-POT CLAVULANATE 875-125 MG PO TABS
1.0000 | ORAL_TABLET | Freq: Once | ORAL | Status: AC
  Administered 2024-10-29: 1 via ORAL
  Filled 2024-10-29: qty 1

## 2024-10-29 NOTE — ED Provider Notes (Signed)
 " [facility] Provider Note   CSN: [id number] Arrival date & time: 10/29/24  1402     Patient presents with: Animal Bite   Emma Medina is a 54 y.o. female.  {Add pertinent medical, surgical, social history, OB history to HPI:498} 54 year old female with prior medical history as detailed below presents for evaluation.  Patient's dog, a pit bull, was apparently attacking her grandson.  She was injured attempting to remove the dog from her grandson.  She was bitten on the left hand.  She reports that her right hand is swollen from where she punched the dog.  She has abrasions to both knees from falling on the floor in the process of fighting the dog off.  She is unsure of her last tetanus.  The dog was up-to-date on its shots.  The grandson was also bitten by the dog and is a patient here at [facility] as well.  The history is provided by the patient and medical records.       Prior to Admission medications  Medication Sig Start Date End Date Taking? Authorizing Provider  acetaminophen  (TYLENOL ) 500 MG tablet Take 2 tablets (1,000 mg total) by mouth every 6 (six) hours as needed for mild pain (pain score 1-3) or moderate pain (pain score 4-6). 01/19/24   Rosalba Glendale DEL, PA-C  aspirin  EC 81 MG EC tablet Take 1 tablet (81 mg total) by mouth daily. 05/23/18   Gerome Maurilio HERO, PA-C  cyclobenzaprine  (FLEXERIL ) 10 MG tablet Take 10 mg by mouth 3 (three) times daily.    [provider]  methocarbamol  (ROBAXIN ) 500 MG tablet Take 1 tablet (500 mg total) by mouth 2 (two) times daily. 08/13/24   Sofia, Leslie K, PA-C  metroNIDAZOLE  (FLAGYL ) 500 MG tablet Take 1 tablet (500 mg total) by mouth 2 (two) times daily. 08/17/24   Cross, Melissa D, NP  rivaroxaban  (XARELTO ) 20 MG TABS tablet Take 1 tablet (20 mg total) by mouth daily with supper. 09/19/24   Sheree Penne Bruckner, MD    Allergies: Patient has no known allergies.    Review  of Systems  All other systems reviewed and are negative.   Updated Vital Signs BP (!) 129/115   Pulse 93   Resp (!) 22   Ht 5' 7 (1.702 m)   Wt 101.6 kg   LMP 06/13/2019 Comment: radiation tx cervical ca  SpO2 99%   BMI 35.08 kg/m   Physical Exam Vitals and nursing note reviewed.  Constitutional:      General: She is not in acute distress.    Appearance: She is well-developed.  HENT:     Head: Normocephalic and atraumatic.  Eyes:     Conjunctiva/sclera: Conjunctivae normal.  Cardiovascular:     Rate and Rhythm: Normal rate and regular rhythm.     Heart sounds: No murmur heard. Pulmonary:     Effort: Pulmonary effort is normal. No respiratory distress.     Breath sounds: Normal breath sounds.  Abdominal:     Palpations: Abdomen is soft.     Tenderness: There is no abdominal tenderness.  Musculoskeletal:        General: No swelling.     Cervical back: Neck supple.  Skin:    General: Skin is warm and dry.     Capillary Refill: Capillary refill takes less than 2 seconds.     Comments: Multiple small puncture wounds to the left wrist, left hand.  Right hand  with edema to the dorsal aspect.  Both upper extremities are neurovascular intact.  Superficial abrasions noted to the anterior aspects of bilateral knees.  Neurological:     Mental Status: She is alert.  Psychiatric:        Mood and Affect: Mood normal.     (all labs ordered are listed, but only abnormal results are displayed) Labs Reviewed - No data to display  EKG: None  Radiology: No results found.  {Document cardiac monitor, telemetry assessment procedure when appropriate:32947} Procedures   Medications Ordered in the ED  HYDROcodone -acetaminophen  (NORCO/VICODIN) 5-325 MG per tablet 1 tablet (1 tablet Oral Given 10/29/24 1419)  Tdap (ADACEL ) injection 0.5 mL (0.5 mLs Intramuscular Given 10/29/24 1419)      {Click here for ABCD2, HEART and other calculators REFRESH Note before signing:1}                               Medical Decision Making Amount and/or Complexity of Data Reviewed Radiology: ordered.  Risk Prescription drug management.   ***  {Document critical care time when appropriate  Document review of labs and clinical decision tools ie CHADS2VASC2, etc  Document your independent review of radiology images and any outside records  Document your discussion with family members, caretakers and with consultants  Document social determinants of health affecting pt's care  Document your decision making why or why not admission, treatments were needed:32947:::1}   Final diagnoses:  None    ED Discharge Orders     None        "

## 2024-10-29 NOTE — ED Notes (Signed)
 Blood cleaned off of pts wounded hand

## 2024-10-29 NOTE — ED Triage Notes (Signed)
 PT BIB [facility] d/t got bite by a pitt bull pt was pulling dog off 54 year old grand son head, stuck her left hand into the dogs mouth , dog bite down on her hand  No bleeding there is puncture wounds Pt Was striking dog w/ right hand trying to get dog off, right hand swollen  Hr 110 Bp 146/47 96% ra

## 2024-10-29 NOTE — Discharge Instructions (Addendum)
 Return for any problem.  The broken bone in your right hand will require surgery.  Please follow-up with Dr. Alyse with [facility].  Call tomorrow for an appointment.  Keep the splint in place until you can see Dr. Alyse.  Your dog bites did not require suturing.  Please take all of the antibiotic as prescribed.  Use prescribed pain medication as needed.

## 2024-10-29 NOTE — Progress Notes (Signed)
 Orthopedic Tech Progress Note Patient Details:  Emma Medina 1971-01-08 [id number]  Ortho Devices Type of Ortho Device: Ace wrap, Cotton web roll, Ulna gutter splint Ortho Device/Splint Location: RUE Ortho Device/Splint Interventions: Ordered, Application   Post Interventions Patient Tolerated: Fair Instructions Provided: Care of device  Delanna LITTIE Pac 10/29/2024, 4:56 PM

## 2024-11-28 ENCOUNTER — Encounter: Admitting: Obstetrics and Gynecology
